# Patient Record
Sex: Male | Born: 1965 | Race: Black or African American | Hispanic: No | Marital: Married | Smoking: Never smoker
Health system: Southern US, Community
[De-identification: ages and names within clinical notes are randomized; demographics above are authoritative.]

## PROBLEM LIST (undated history)

## (undated) DIAGNOSIS — I5022 Chronic systolic (congestive) heart failure: Secondary | ICD-10-CM

## (undated) DIAGNOSIS — Z1211 Encounter for screening for malignant neoplasm of colon: Secondary | ICD-10-CM

## (undated) DIAGNOSIS — I300 Acute nonspecific idiopathic pericarditis: Secondary | ICD-10-CM

## (undated) DIAGNOSIS — I509 Heart failure, unspecified: Secondary | ICD-10-CM

## (undated) DIAGNOSIS — I447 Left bundle-branch block, unspecified: Secondary | ICD-10-CM

## (undated) DIAGNOSIS — E559 Vitamin D deficiency, unspecified: Secondary | ICD-10-CM

## (undated) DIAGNOSIS — I428 Other cardiomyopathies: Secondary | ICD-10-CM

## (undated) DIAGNOSIS — G4733 Obstructive sleep apnea (adult) (pediatric): Secondary | ICD-10-CM

## (undated) DIAGNOSIS — E7800 Pure hypercholesterolemia, unspecified: Secondary | ICD-10-CM

## (undated) DIAGNOSIS — G4730 Sleep apnea, unspecified: Secondary | ICD-10-CM

## (undated) DIAGNOSIS — R600 Localized edema: Secondary | ICD-10-CM

## (undated) DIAGNOSIS — I10 Essential (primary) hypertension: Secondary | ICD-10-CM

## (undated) HISTORY — DX: Acute nonspecific idiopathic pericarditis: I30.0

## (undated) HISTORY — DX: Heart failure, unspecified: I50.9

## (undated) HISTORY — DX: Other cardiomyopathies: I42.8

## (undated) HISTORY — DX: Pure hypercholesterolemia, unspecified: E78.00

## (undated) HISTORY — DX: Localized edema: R60.0

## (undated) HISTORY — DX: Obstructive sleep apnea (adult) (pediatric): G47.33

## (undated) HISTORY — DX: Encounter for screening for malignant neoplasm of colon: Z12.11

## (undated) HISTORY — PX: POLYPECTOMY: SHX149

## (undated) HISTORY — DX: Essential (primary) hypertension: I10

## (undated) HISTORY — DX: Vitamin D deficiency, unspecified: E55.9

## (undated) HISTORY — DX: Chronic systolic (congestive) heart failure: I50.22

## (undated) HISTORY — PX: CARDIAC CATHETERIZATION: SHX172

## (undated) HISTORY — DX: Left bundle-branch block, unspecified: I44.7

## (undated) HISTORY — DX: Sleep apnea, unspecified: G47.30

## (undated) HISTORY — PX: COLONOSCOPY: SHX174

## (undated) HISTORY — DX: Morbid (severe) obesity due to excess calories: E66.01

---

## 2009-03-05 DIAGNOSIS — E782 Mixed hyperlipidemia: Secondary | ICD-10-CM | POA: Insufficient documentation

## 2009-03-05 DIAGNOSIS — E1169 Type 2 diabetes mellitus with other specified complication: Secondary | ICD-10-CM | POA: Insufficient documentation

## 2009-05-02 DIAGNOSIS — I517 Cardiomegaly: Secondary | ICD-10-CM | POA: Insufficient documentation

## 2009-09-29 ENCOUNTER — Inpatient Hospital Stay (HOSPITAL_COMMUNITY): Admission: EM | Admit: 2009-09-29 | Discharge: 2009-10-02 | Payer: Self-pay | Admitting: Emergency Medicine

## 2009-09-29 ENCOUNTER — Ambulatory Visit: Payer: Self-pay | Admitting: Cardiology

## 2009-10-14 ENCOUNTER — Ambulatory Visit: Payer: Self-pay | Admitting: Cardiology

## 2009-10-14 DIAGNOSIS — I5022 Chronic systolic (congestive) heart failure: Secondary | ICD-10-CM | POA: Insufficient documentation

## 2009-10-14 DIAGNOSIS — E7800 Pure hypercholesterolemia, unspecified: Secondary | ICD-10-CM | POA: Insufficient documentation

## 2009-10-15 LAB — CONVERTED CEMR LAB
CO2: 28 meq/L (ref 19–32)
Chloride: 104 meq/L (ref 96–112)
GFR calc non Af Amer: 62.49 mL/min (ref >60)
Potassium: 4.5 meq/L (ref 3.5–5.1)
Sodium: 140 meq/L (ref 135–145)

## 2009-10-16 ENCOUNTER — Telehealth (INDEPENDENT_AMBULATORY_CARE_PROVIDER_SITE_OTHER): Payer: Self-pay | Admitting: *Deleted

## 2009-10-20 ENCOUNTER — Encounter: Payer: Self-pay | Admitting: Cardiology

## 2009-10-20 ENCOUNTER — Encounter (HOSPITAL_COMMUNITY): Admission: RE | Admit: 2009-10-20 | Discharge: 2010-01-06 | Payer: Self-pay | Admitting: Cardiology

## 2009-10-20 ENCOUNTER — Ambulatory Visit: Payer: Self-pay

## 2009-10-20 ENCOUNTER — Ambulatory Visit: Payer: Self-pay | Admitting: Cardiology

## 2009-10-28 ENCOUNTER — Telehealth: Payer: Self-pay | Admitting: Cardiology

## 2009-10-30 ENCOUNTER — Telehealth: Payer: Self-pay | Admitting: Cardiology

## 2009-11-04 ENCOUNTER — Telehealth: Payer: Self-pay | Admitting: Cardiology

## 2009-11-04 ENCOUNTER — Ambulatory Visit: Payer: Self-pay | Admitting: Cardiology

## 2009-11-04 DIAGNOSIS — R9430 Abnormal result of cardiovascular function study, unspecified: Secondary | ICD-10-CM | POA: Insufficient documentation

## 2009-11-04 HISTORY — DX: Abnormal result of cardiovascular function study, unspecified: R94.30

## 2009-11-06 ENCOUNTER — Ambulatory Visit: Payer: Self-pay | Admitting: Cardiology

## 2009-11-06 LAB — CONVERTED CEMR LAB
CO2: 25 meq/L (ref 19–32)
Chloride: 108 meq/L (ref 96–112)
Creatinine, Ser: 1.2 mg/dL (ref 0.4–1.5)
Eosinophils Relative: 2.5 % (ref 0.0–5.0)
GFR calc non Af Amer: 81.58 mL/min (ref >60)
Hemoglobin: 12.7 g/dL — ABNORMAL LOW (ref 13.0–17.0)
Lymphocytes Relative: 21.4 % (ref 12.0–46.0)
Lymphs Abs: 1.7 10*3/uL (ref 0.7–4.0)
MCHC: 33.2 g/dL (ref 30.0–36.0)
Monocytes Absolute: 0.6 10*3/uL (ref 0.1–1.0)
Neutro Abs: 5.3 10*3/uL (ref 1.4–7.7)
Potassium: 4.4 meq/L (ref 3.5–5.1)
RDW: 15.1 % — ABNORMAL HIGH (ref 11.5–14.6)

## 2009-11-12 ENCOUNTER — Ambulatory Visit: Payer: Self-pay | Admitting: Internal Medicine

## 2009-11-13 ENCOUNTER — Inpatient Hospital Stay (HOSPITAL_BASED_OUTPATIENT_CLINIC_OR_DEPARTMENT_OTHER): Admission: RE | Admit: 2009-11-13 | Discharge: 2009-11-13 | Payer: Self-pay | Admitting: Cardiology

## 2009-11-13 ENCOUNTER — Ambulatory Visit: Payer: Self-pay | Admitting: Cardiology

## 2009-11-13 HISTORY — PX: LEFT HEART CATH: SHX5946

## 2009-12-08 ENCOUNTER — Ambulatory Visit: Payer: Self-pay | Admitting: Cardiology

## 2009-12-22 ENCOUNTER — Ambulatory Visit: Payer: Self-pay | Admitting: Cardiology

## 2009-12-24 LAB — CONVERTED CEMR LAB
Albumin: 3.7 g/dL (ref 3.5–5.2)
BUN: 16 mg/dL (ref 6–23)
CO2: 27 meq/L (ref 19–32)
Calcium: 9.2 mg/dL (ref 8.4–10.5)
Chloride: 108 meq/L (ref 96–112)
Glucose, Bld: 92 mg/dL (ref 70–99)
HDL: 26.4 mg/dL — ABNORMAL LOW (ref >39.00)
LDL Cholesterol: 79 mg/dL (ref 0–99)
Potassium: 4.5 meq/L (ref 3.5–5.1)
Sodium: 141 meq/L (ref 135–145)
Total Bilirubin: 0.5 mg/dL (ref 0.3–1.2)
Total CHOL/HDL Ratio: 5
Total Protein: 6.6 g/dL (ref 6.0–8.3)
Triglycerides: 128 mg/dL (ref 0.0–149.0)

## 2010-01-01 ENCOUNTER — Encounter
Admission: RE | Admit: 2010-01-01 | Discharge: 2010-01-29 | Payer: Self-pay | Source: Home / Self Care | Admitting: Cardiology

## 2010-02-25 ENCOUNTER — Ambulatory Visit: Payer: Self-pay | Admitting: Cardiovascular Disease

## 2010-02-25 ENCOUNTER — Ambulatory Visit (HOSPITAL_COMMUNITY): Admission: RE | Admit: 2010-02-25 | Discharge: 2010-02-25 | Payer: Self-pay | Admitting: Cardiology

## 2010-02-25 ENCOUNTER — Encounter: Payer: Self-pay | Admitting: Internal Medicine

## 2010-02-25 ENCOUNTER — Ambulatory Visit: Payer: Self-pay | Admitting: Internal Medicine

## 2010-02-25 ENCOUNTER — Ambulatory Visit: Payer: Self-pay

## 2010-03-13 ENCOUNTER — Ambulatory Visit: Payer: Self-pay | Admitting: Cardiology

## 2010-03-23 ENCOUNTER — Ambulatory Visit: Payer: Self-pay | Admitting: Cardiology

## 2010-03-24 DIAGNOSIS — I300 Acute nonspecific idiopathic pericarditis: Secondary | ICD-10-CM

## 2010-03-24 HISTORY — DX: Acute nonspecific idiopathic pericarditis: I30.0

## 2010-06-02 NOTE — Assessment & Plan Note (Signed)
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  He was initially diagnosed with CHF about 10 years ago.  This was diagnosed at [facility] and felt to likely be due to a viral cardiomyopathy.  He did well until recently.   He was admitted to [facility] on 09/29/09 with acute CHF and hypertensive urgency.  He was severely short of breath for the few days prior to admission and was wheezing.  He was noted to have pulmonary edema and volume overload and was diuresed and begun on a medical regimen for his CHF (he was previously not taking beta blockers or ace inhibitors).  He was evaluate by Dr Shirlee Latch and initiated on medical therapy for his CHF.  He has a heart cath planned for tomorrow.  Since discharge home, he has done reasonably well.  He has started working out at [employer] 3 days a week (treadmill and weights) and he walks 1-2 miles a day when he does not go to the gym.  He continues to have decreased exercise tolerance and has not started mowing his grass again.  He continues to have intermittent BLE edema.   Given the relatively new onset symptomatic CHF (apparently had well-compensated CHF over a number of years), he had an adenosine myoview  which revealed  EF 21% with scar in the anterior and inferior walls and at the apex with suggestion of peri-infarction ischemia.  He is scheduled for heart cath tomorrow.  Current Medications (verified): 1)  Coreg 12.5 Mg Tabs (Carvedilol) .... One Tablet Twice A Day 2)  Furosemide 40 Mg Tabs (Furosemide) .... Take One Tablet Daily 3)  Lisinopril 5 Mg Tabs (Lisinopril) .... Take One Tablet Two Times A Day 4)  Aspirin 81 Mg Tabs (Aspirin) .... Once Daily 5)  Spironolactone 25 Mg Tabs  (Spironolactone) .... One Tablet Daily 6)  Multivitamins  Tabs (Multiple Vitamin) .... Once Daily 7)  Fish Oil 1000 Mg Caps (Omega-3 Fatty Acids) .... Once Daily 8)  Simvastatin 40 Mg Tabs (Simvastatin) .... Take One Tablet Once Daily 9)  Klor-Con M20 20 Meq Cr-Tabs (Potassium Chloride Crys Cr) .... Take One Tablet Daily  Allergies (verified): No Known Drug Allergies  Past History:  Past Medical History: Reviewed history from 11/04/2009 and no changes required. 1. Cardiomyopathy: Probably nonischemic.  He had a left heart cath in 2002 or 2003 with no significant coronary disease per his report (this was done in [place]).  It sounds like his cardiomyopathy has been known for around 9-10 years.  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  Echo (5/11): severe LV dilation with EF < 20%, diffuse hypokinesis, moderate diastolic dysfunction, severe left atrial enlargement.  First hospitalization for CHF was in 5/11.  No drug use.  Adenosine myoview (6/11): EF 21%, diffuse hypokinesis, scar in the anterior and inferior walls and at the apex.  Mild peri-infarct ischemia.  2. Hypertension 3. Chronic left bundle branch block 4. Hyperlipidemia  Past Surgical History: none  Family History: Reviewed history from 10/14/2009 and no changes required. Family history is negative for premature diagnosis of coronary artery disease or any arrhythmia or history of heart failure in first-degree relatives.  Mother and father do  have a history of hypertension  Social History: The patient lives in [place] with his wife and 2 children.   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   Vital Signs:  Patient profile:   45 year old male Height:       73 inches Weight:      272 pounds BMI:     36.02 Pulse rate:   90 / minute BP sitting:   116 / 82  (left arm)  Vitals Entered By: Laurance Flatten CMA (November 12, 2009 2:45 PM)  Physical Exam  General:  Well developed, well nourished, in no acute distress. Head:  normocephalic and atraumatic Mouth:  Teeth, gums and palate normal. Oral mucosa normal. Neck:  supple, JVP 10cm Lungs:  Clear bilaterally to auscultation and percussion. Heart:  RRR, paradoxical S2 split, no m/r/g Abdomen:  Bowel sounds positive; abdomen soft and non-tender without masses, organomegaly, or hernias noted. No hepatosplenomegaly. Msk:  Back normal, normal gait. Muscle strength and tone normal. Pulses:  pulses normal in all 4 extremities Extremities:  No clubbing or cyanosis. trace edema Neurologic:  Alert and oriented x 3. Skin:  Intact without lesions or rashes. Cervical Nodes:  no significant adenopathy Psych:  Normal affect.   EKG  Procedure date:  10/15/2009  Findings:      sinus rhythm 88 bpm, LBBB (QRS 186 ms), Qtc 532  Impression & Recommendations:  Problem # 1:  CHRONIC SYSTOLIC HEART FAILURE (ICD-428.22) The patient has a nonischemic CM (EF 21%), NYHA Class II/III CHF, and LBBB.  He has a heart catheterization scheduled for tomorrow.  If he has nonobstructive CAD and continues to have a depressed ejection fraction after 3 months of medical optimization, then he will meet SCD-HeFT criteria for ICD implantation.  If he has obstructive CAD requiring revascularizaiton or his EF improves to >35% with medical therapy, then we will have to reconsider his options.  Today, we discussed risks, benefits, alternatives to ICD implantation.  He has NYHA Class II/III CHF and a wide LBBB on EKG.  I would therefore also consider resynchronization therapy at the same time as ICD implantation. We will obtain an echo after 3 months of optimal medical management (9/11) and then proceed with BiV ICD implantation if he meets  criteria at that time.  Other Orders: Echocardiogram (Echo)  Patient Instructions: 1)  Your physician recommends that you schedule a follow-up appointment in: 01/2010 with Dr Johney Frame with an Echo prior to appointment or the same day as appoinment

## 2010-06-02 NOTE — Assessment & Plan Note (Signed)
Summary: 3wk f/u cath done 11-13-09/sl   Referring Provider:  Dr Shirlee Latch Primary Provider:  [name], MD  CC:  3 week follow up to cardiac cath.  Pt reports he is feeling fine today.  No cardiac concerns today.  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  He was initially diagnosed with CHF about 10 years ago.  This was diagnosed at [facility] and felt to likely be due to a viral cardiomyopathy.   He was admitted to [facility] on 09/29/09 with acute CHF and hypertensive urgency.  He was severely short of breath for the few days prior to admission and was wheezing.  He was noted to have pulmonary edema and volume overload and was diuresed and begun on a medical regimen for his CHF (he was previously not taking beta blockers or ace inhibitors).  Given a myoview revealing extensive perfusion defects, he had a left heart cath done which showed EF 15% on LV-gram and no angiographic coronary disease.    Recently, he has been doing well.  He is going to the [facility] three days a week.  He denies exertional dyspnea on current medical regimen.  He has lost 2.5 lbs since I last saw him.  No orthopnea or PND.   Labs (6/11): BNP 347=>161=>167, creatinine 1.08=>1.6, urine drug screen negative, TSH normal, LDL 147, HDL 34, TGs 112 Labs (7/11): K 4.4, creatinine 1.2    Current Medications (verified): 1)  Coreg 12.5 Mg Tabs (Carvedilol) .... One Tablet Twice A Day 2)  Furosemide 40 Mg Tabs (Furosemide) .... Take One Tablet Daily 3)  Lisinopril 5 Mg Tabs (Lisinopril) .... Take One Tablet Two Times A Day 4)  Aspirin 81 Mg Tabs (Aspirin) .... Once Daily 5)  Spironolactone 25 Mg Tabs (Spironolactone) .... One Tablet Daily 6)  Multivitamins  Tabs (Multiple Vitamin) .... Once Daily 7)  Fish Oil 1000 Mg Caps (Omega-3 Fatty Acids) .... Once Daily 8)  Simvastatin 40 Mg Tabs  (Simvastatin) .... Take One Tablet Once Daily 9)  Klor-Con M20 20 Meq Cr-Tabs (Potassium Chloride Crys Cr) .... Take One Tablet Daily  Allergies (verified): No Known Drug Allergies  Past History:  Past Medical History: 1. Cardiomyopathy: Probably nonischemic.  He had a left heart cath in 2002 or 2003 with no significant coronary disease per his report (this was done in [place]).  It sounds like his cardiomyopathy has been known for around 9-10 years.  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  Echo (5/11): severe LV dilation with EF < 20%, diffuse hypokinesis, moderate diastolic dysfunction, severe left atrial enlargement.  First hospitalization for CHF was in 5/11.  No drug use.  Adenosine myoview (6/11): EF 21%, diffuse hypokinesis, scar in the anterior and inferior walls and at the apex.  Mild peri-infarct ischemia.  LHC was done (7/11) showing EF 15%, global hypokinesis, LVEDP 22 mmHg, no angiographic CAD.  2. Hypertension 3. Chronic left bundle branch block 4. Hyperlipidemia  Family History: Reviewed history from 10/14/2009 and no changes required. Family history is negative for premature diagnosis of coronary artery disease or any arrhythmia or history of heart failure in first-degree relatives.  Mother and father do have a history of hypertension  Social History: Reviewed history from 11/12/2009 and no changes required. The patient lives in [place] with his wife and 2 children.  He is a Data processing manager for [employer].  He denies significant tobacco, EtOH, nor any illicit drug use, herbal medication  use, regular diet, and no regular exercise  Review of Systems       All systems reviewed and negative except as per HPI.   Vital Signs:  Patient profile:   45 year old male Height:      73 inches Weight:      270 pounds BMI:     35.75 Pulse rate:   75 / minute Pulse rhythm:   regular BP sitting:   108 / 70  (left  arm) Cuff size:   regular  Vitals Entered By: Judithe Modest CMA (December 08, 2009 4:53 PM)  Physical Exam  General:  Well developed, well nourished, in no acute distress. Neck:  Neck supple, no JVD. No masses, thyromegaly or abnormal cervical nodes. Lungs:  Clear bilaterally to auscultation and percussion. Heart:  Non-displaced PMI, chest non-tender; regular rate and rhythm, S1, S2 without murmurs, rubs or gallops. S2 is paradoxically split.  Carotid upstroke normal, no bruit.  Pedals normal pulses. No edema, no varicosities. Abdomen:  Bowel sounds positive; abdomen soft and non-tender without masses, organomegaly, or hernias noted. No hepatosplenomegaly. Extremities:  No clubbing or cyanosis. Neurologic:  Alert and oriented x 3. Psych:  Normal affect.   Impression & Recommendations:  Problem # 1:  CHRONIC SYSTOLIC HEART FAILURE (ICD-428.22) The patient has a nonischemic CM (EF EF 15-20%) and LBBB.  He is doing well functionally with current CHF treatment.  I will continue him on the current dose of Coreg and spironolactone.  I will increase his lisinopril to 7.5 mg two times a day.  He appears euvolemic on exam and will continue current dose of Lasix.  BMET in 2 wks.  He has LBBB and chronically depressed systolic function.  I will repeat an echo on good medical therapy in 9/11.  If EF is still depressed, he will undergo placement of CRT-D device (Dr. Johney Frame).   Problem # 2:  PURE HYPERCHOLESTEROLEMIA (ICD-272.0) Lipids/LFTs have been ok.   Patient Instructions: 1)  Your physician has recommended you make the following change in your medication:  2)  Increase Lisinopril to 7.5mg  twice a day--this will be one and one-half 5mg  tablets twice a day. 3)  Your physician recommends that you return for lab work in: 2 weeks--BMP 428.22 4)  Your physician recommends that you schedule a follow-up appointment in: 3 months with Dr Shirlee Latch. Prescriptions: LISINOPRIL 5 MG TABS (LISINOPRIL) one and  one-half tablets twice a day  #90 x 11   Entered by:   Katina Dung, RN, BSN   Authorized by:   Marca Ancona, MD   Signed by:   Katina Dung, RN, BSN on 12/08/2009   Method used:   Electronically to        [facility]. [phone]* (retail)       [address]       [place], [place]  [address]       Ph: [phone]       Fax: [phone]   RxID:   [id number]

## 2010-06-02 NOTE — Progress Notes (Signed)
Summary: PT WANTS A NURSE CALL [facility] APPT TODAY  Phone Note Call from Patient   Caller: Patient [phone] Reason for Call: Talk to Nurse Summary of Call: PT CALLED TO [facility] APPT TODAY-NEXT AVAILABLE IS JULY 12, PT DECIDED HE WANTED A CALL FROM THE NURSE-PLS CALL [phone] Initial call taken by: Glynda Jaeger,  October 28, 2009 8:38 AM  Follow-up for Phone Call        talked with pt by telephone--resheduled appt to Tuesday July 5,2011 with Dr Shirlee Latch

## 2010-06-02 NOTE — Progress Notes (Signed)
Summary: wants rx refill  Phone Note Call from Patient   Caller: Patient Reason for Call: Refill Medication Summary of Call: pt states [facility] to request refill of potassium-hasn't recieved it yet-can it be called in? Initial call taken by: Glynda Jaeger,  October 30, 2009 3:15 PM    Prescriptions: KLOR-CON M20 20 MEQ CR-TABS (POTASSIUM CHLORIDE CRYS CR) take one tablet daily  #30 x 11   Entered by:   Katina Dung, RN, BSN   Authorized by:   Marca Ancona, MD   Signed by:   Katina Dung, RN, BSN on 10/30/2009   Method used:   Electronically to        [facility]. [phone]* (retail)       [address]       [place], [place]  [address]       Ph: [phone]       Fax: [phone]   RxID:   [id number]

## 2010-06-02 NOTE — Letter (Signed)
Summary: Cardiac Catheterization Instructions- [facility]  [facility], Main Office  [address]   [place], [place] [address]   Phone: [phone]  Fax: [phone]     11/04/2009 MRN: [id number]  IV[address]EN [address] [place], [place]  [address]  Dear Mr. Missildine,   You are scheduled for a Cardiac Catheterization on Thursday July 14 with Dr. Shirlee Latch.  Please arrive to the 1st floor of the Heart and Vascular Center at [facility] at 8:30 am on the day of your procedure. Please do not arrive before 6:30 a.m. Call the Heart and Vascular Center at [phone] if you are unable to make your appointmnet. The Code to get into the parking garage under the building is 1000. Take the elevators to the 1st floor. You must have someone to drive you home. Someone must be with you for the first 24 hours after you arrive home. Please wear clothes that are easy to get on and off and wear slip-on shoes. Do not eat or drink after midnight except water with your medications that morning. Bring all your medications and current insurance cards with you.   Return for lab July 7,2011 at the [facility].  _x__ Make sure you take your aspirin.  _x__ You may take ALL of your medications with water that morning. ________________________________________________________________________________________________________________________________    The usual length of stay after your procedure is 2 to 3 hours. This can vary.  If you have any questions, please call the office at the number listed above.   Katina Dung, RN, BSN

## 2010-06-02 NOTE — Assessment & Plan Note (Signed)
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  He was initially diagnosed with CHF about 10 years ago.  This was diagnosed at [facility] and felt to likely be due to a viral cardiomyopathy.   He was admitted to [facility] on 09/29/09 with acute CHF and hypertensive urgency.  He was severely short of breath for the few days prior to admission and was wheezing.  He was noted to have pulmonary edema and volume overload and was diuresed and begun on a medical regimen for his CHF (he was previously not taking beta blockers or ace inhibitors).  Given a myoview revealing extensive perfusion defects, he had a left heart cath done which showed EF 15% on LV-gram and no angiographic coronary disease.    Recently, he has been doing well.   He reports complaince with medical therapy.  Unfortunately, despite optimal medical therapies, his EF remains 20%.  Current Medications (verified): 1)  Coreg 12.5 Mg Tabs (Carvedilol) .... One Tablet Twice A Day 2)  Furosemide 40 Mg Tabs (Furosemide) .... Take One Tablet Daily 3)  Lisinopril 5 Mg Tabs (Lisinopril) .... One and One-Half Tablets Twice A Day 4)  Aspirin 81 Mg Tabs (Aspirin) .... Once Daily 5)  Spironolactone 25 Mg Tabs (Spironolactone) .... One Tablet Daily 6)  Multivitamins  Tabs (Multiple Vitamin) .... Once Daily 7)  Fish Oil 1000 Mg Caps (Omega-3 Fatty Acids) .... Once Daily 8)  Simvastatin 40 Mg Tabs (Simvastatin) .... Take One Tablet Once Daily 9)  Klor-Con M20 20 Meq Cr-Tabs (Potassium Chloride Crys Cr) .... Take One Tablet Daily  Allergies (verified): No Known Drug Allergies  Past History:  Past Medical History: Reviewed history from 12/08/2009  and no changes required. 1. Cardiomyopathy: Probably nonischemic.  He had a left heart cath in 2002 or 2003 with no significant coronary disease per his report (this was done in [place]).  It sounds like his cardiomyopathy has been known for around 9-10 years.  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  Echo (5/11): severe LV dilation with EF < 20%, diffuse hypokinesis, moderate diastolic dysfunction, severe left atrial enlargement.  First hospitalization for CHF was in 5/11.  No drug use.  Adenosine myoview (6/11): EF 21%, diffuse hypokinesis, scar in the anterior and inferior walls and at the apex.  Mild peri-infarct ischemia.  LHC was done (7/11) showing EF 15%, global hypokinesis, LVEDP 22 mmHg, no angiographic CAD.  2. Hypertension 3. Chronic left bundle branch block 4. Hyperlipidemia  Past Surgical History: Reviewed history from 11/12/2009 and no changes required. none  Social History: Reviewed history from 11/12/2009 and no changes required. The patient lives in [place] with his wife and 2 children.   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   Vital Signs:  Patient profile:   45 year old male Height:      73 inches Pulse rate:  58 / minute BP sitting:   130 / 82  (left arm)  Vitals Entered By: Laurance Flatten CMA (February 25, 2010 4:54 PM)  Physical Exam  General:  Well developed, well nourished, in no acute distress. Head:  normocephalic and atraumatic Mouth:  Teeth, gums and palate normal. Oral mucosa normal. Neck:  Neck supple, no JVD. No masses, thyromegaly or abnormal cervical nodes. Lungs:  Clear bilaterally to auscultation and percussion. Heart:  RRR, no m/r/g, wide S2 split Abdomen:  Bowel sounds  positive; abdomen soft and non-tender without masses, organomegaly, or hernias noted. No hepatosplenomegaly. Msk:  Back normal, normal gait. Muscle strength and tone normal. Pulses:  pulses normal in all 4 extremities Extremities:  No clubbing or cyanosis. Neurologic:  Alert and oriented x 3.   EKG  Procedure date:  02/25/2010  Findings:      sinus rhythm 58 bpm, LBBB (QRS in lead I)  Impression & Recommendations:  Problem # 1:  CHRONIC SYSTOLIC HEART FAILURE (ICD-428.22) The patient has a nonischemic CM (EF 20%) and LBBB.  He is doing well functionally with current CHF treatment.  I reviewed his echo today with Gary Williamson.  Unfortunately, despite optimal medical therapy, his EF remains depressed.  He meets SCD-HeFT criteria for ICD implantation for primary prevention of sudden death.   I also believe that he would benefit from resynchronization therapy.  I have encouraged BiV ICD implantation today.  The patient wishes to further contemplate this option.  He wishes to return with his spouse to further discuss ICD implantation.  He will continue medical therapy in the interm.  Patient Instructions: 1)  Your physician recommends that you schedule a follow-up appointment in: 2 WEEKS WITH [facility] 2)  Your physician recommends that you continue on your current medications as directed. Please refer to the Current Medication list given to you today.

## 2010-06-02 NOTE — Cardiovascular Report (Signed)
Summary: Pre Cath Orders   Pre Cath Orders   Imported By: Roderic Ovens 11/14/2009 12:32:03  _____________________________________________________________________  External Attachment:    Type:   Image     Comment:   External Document

## 2010-06-02 NOTE — Assessment & Plan Note (Signed)
Summary: Cardiology Nuclear Study  Nuclear Med Background Indications for Stress Test: Evaluation for Ischemia, Post Hospital  Indications Comments: 09/29/09 [facility]: Acute CHF/HTN/SOB/wheezing/Edema  History: Echo, Heart Catheterization  History Comments: '03 Heart Cath: NL per Pt., no report 09/30/09 Echo: EF=<20%  Symptoms: DOE, Fatigue, SOB    Nuclear Pre-Procedure Cardiac Risk Factors: Hypertension, LBBB, Lipids Caffeine/Decaff Intake: None NPO After: 6:00 PM Lungs: clear IV 0.9% NS with Angio Cath: 18g     IV Site: (R) AC IV Started by: Stanton Kidney EMT-P Chest Size (in) 52     Height (in): 73 Weight (lb): 269 BMI: 35.62 Tech Comments: Coreg held per patient.  Nuclear Med Study 1 or 2 day study:  1 day     Stress Test Type:  Adenosine Reading MD:  Willa Rough, MD     Referring MD:  D.Mclean Resting Radionuclide:  Technetium 70m Tetrofosmin     Resting Radionuclide Dose:  10.6 mCi  Stress Radionuclide:  Technetium 45m Tetrofosmin     Stress Radionuclide Dose:  33 mCi   Stress Protocol  Dose of Adenosine:  60 mg    Stress Test Technologist:  Milana Na EMT-P     Nuclear Technologist:  Domenic Polite CNMT  Rest Procedure  Myocardial perfusion imaging was performed at rest 45 minutes following the intravenous administration of Myoview Technetium 10m Tetrofosmin.  Stress Procedure  The patient received IV adenosine at 140 mcg/kg/min for 4 minutes. There were no significant changes with infusion. Myoview was injected at the 2 minute mark and quantitative spect images were obtained after a 45 minute delay.  QPS Raw Data Images:  Normal; no motion artifact; normal heart/lung ratio. Stress Images:  Moderate decrease in activity in the anterior wall and the inferior wall and the apex. Rest Images:  Similar to stress Subtraction (SDS):  mild reversibility Transient Ischemic Dilatation:  1.06  (Normal <1.22)  Lung/Heart Ratio:  48  (Normal <0.45)  Quantitative Gated  Spect Images QGS EDV:  420 ml QGS ESV:  333 ml QGS EF:  21 % QGS cine images:  Severe global hypokinesis and significant LV dilitation.   Overall Impression  Exercise Capacity: Adenosine study with no exercise. BP Response: Normal blood pressure response. Clinical Symptoms: SOB ECG Impression: LBBB Overall Impression Comments: There is LV dilitation and severe global LV dysfunction at rest. There is moderate scar in the anterior wall,  inferior wall, and apex  with mild perinfarct ischemia in these areas.    Appended Document: Cardiology Nuclear Study Anterior and inferior scar with peri-infarct ischemia.  ? CAD present.  Will need to discuss left heart cath.   Appended Document: Cardiology Nuclear Study  [facility] [phone]  Appended Document: Cardiology Nuclear Study discussed results with pt by telephone--appt made with Dr Shirlee Latch for 10/28/09

## 2010-06-02 NOTE — Assessment & Plan Note (Signed)
Summary: 2 month rov   Visit Type:  2 month follow up Referring Mukund Weinreb:  Dr Shirlee Latch Primary Emeril Stille:  [name], MD  CC:  no complaints.  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  He has been doing well recently.  He is working out at [employer] around 4 times a week, walking on the track and using the elliptical.  He denies exertional dyspnea.  No orthopnea or PND.  He is tolerating his medications well.  He met with Dr. Johney Frame regarding CRT-D and will go back for another appointment with his wife.  Repeat echo in 10/11 showed that EF remains 20-25%.  Weight remains stable at 270 lbs.   Labs (8/11): K 4.5, creatinine 1.3, LDL 79, HDL 26  Current Medications (verified): 1)  Coreg 12.5 Mg Tabs (Carvedilol) .... One Tablet Twice A Day 2)  Furosemide 40 Mg Tabs (Furosemide) .... Take One Tablet Daily 3)  Lisinopril 5 Mg Tabs (Lisinopril) .... One and One-Half Tablets Twice A Day 4)  Aspirin 81 Mg Tabs (Aspirin) .... Once Daily 5)  Spironolactone 25 Mg Tabs (Spironolactone) .... One Tablet Daily 6)  Multivitamins  Tabs (Multiple Vitamin) .... Once Daily 7)  Fish Oil 1000 Mg Caps (Omega-3 Fatty Acids) .... Once Daily 8)  Simvastatin 40 Mg Tabs (Simvastatin) .... Take One Tablet Once Daily 9)  Klor-Con M20 20 Meq Cr-Tabs (Potassium Chloride Crys Cr) .... Take One Tablet Daily  Allergies (verified): No Known Drug Allergies  Past History:  Past Medical History: 1. Cardiomyopathy: Probably nonischemic.  He had a left heart cath in 2002 or 2003 with no significant coronary disease per his report (this was done in [place]).  It sounds like his cardiomyopathy has been known for around 9-10 years.  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  Echo (5/11): severe LV dilation with EF < 20%, diffuse hypokinesis,  moderate diastolic dysfunction, severe left atrial enlargement.  First hospitalization for CHF was in 5/11.  No drug use.  Adenosine myoview (6/11): EF 21%, diffuse hypokinesis, scar in the anterior and inferior walls and at the apex.  Mild peri-infarct ischemia.  LHC was done (7/11) showing EF 15%, global hypokinesis, LVEDP 22 mmHg, no angiographic CAD.   Repeat echo (10/11): EF 20-25%, severely dilated LV with diffuse hypokinesis, mild MR.  2. Hypertension 3. Chronic left bundle branch block 4. Hyperlipidemia  Family History: Reviewed history from 10/14/2009 and no changes required. Family history is negative for premature diagnosis of coronary artery disease or any arrhythmia or history of heart failure in first-degree relatives.  Mother and father do have a history of hypertension  Social History: Reviewed history from 11/12/2009 and no changes required. The patient lives in [place] with his wife and 2 children.   He is a Data processing manager for [employer].  He denies significant tobacco, EtOH, nor any illicit drug use, herbal medication  use, regular diet, and no regular exercise  Review of Systems       All systems reviewed and negative except as per HPI.   Vital Signs:  Patient profile:   45 year old male Height:      73 inches Weight:      270.50 pounds BMI:     35.82 Pulse rate:   74 / minute BP sitting:  118 / 83  (left arm) Cuff size:   large  Vitals Entered By: Caralee Ates CMA (March 13, 2010 8:38 AM)  Physical Exam  General:  Well developed, well nourished, in no acute distress.  Obese.  Neck:  Neck supple, no JVD. No masses, thyromegaly or abnormal cervical nodes. Lungs:  Clear bilaterally to auscultation and percussion. Heart:  Non-displaced PMI, chest non-tender; regular rate and rhythm, S1, S2 without murmurs, rubs or gallops. S2 is paradoxically split.  Carotid upstroke normal, no bruit.  Pedals normal pulses. No edema, no  varicosities. Abdomen:  Bowel sounds positive; abdomen soft and non-tender without masses, organomegaly, or hernias noted. No hepatosplenomegaly. Extremities:  No clubbing or cyanosis. Neurologic:  Alert and oriented x 3. Psych:  Normal affect.   Impression & Recommendations:  Problem # 1:  CHRONIC SYSTOLIC HEART FAILURE (ICD-428.22) The patient has a nonischemic CM (EF EF 20-25%) and LBBB.  He is doing well functionally with current CHF treatment.  I will continue him on the current dose of Coreg and spironolactone.  I will increase his lisinopril to 10 mg two times a day with BMET/BNP in 2 wks.   He appears euvolemic on exam and will continue current dose of Lasix.  He has seen Dr. Johney Frame regarding CRT-D and thinks that he will go forward with this.  I certainly think that he needs this as patients with nonischemic cardiomyopathy and LBBB tend to do quite well with CRT.  We discussed starting Bidil in the future.  He thinks he took this in the past and it gave him a bad headache, but he was able to tolerate it when he took hydralazine and isordil separately.  We will plan on starting hydralazine and isordil when I see him again (increasing lisinopril today).   Problem # 2:  PURE HYPERCHOLESTEROLEMIA (ICD-272.0) Lipids/LFTs at goal.   Patient Instructions: 1)  Your physician has recommended you make the following change in your medication:  2)  Increase Lisinopril to 10mg  twice a day. You can take two 5mg  tablets twice a day. 3)  Your physician recommends that you return for lab work in: 10 days--BMP/BNP 428.22 4)  Your physician recommends that you schedule a follow-up appointment in: 3 months with Dr Shirlee Latch. Prescriptions: LISINOPRIL 10 MG TABS (LISINOPRIL) one twice a day  #180 x 3   Entered by:   Katina Dung, RN, BSN   Authorized by:   Marca Ancona, MD   Signed by:   Katina Dung, RN, BSN on 03/13/2010   Method used:   Electronically to        [facility])              ,          Ph:        Fax: [phone]   RxID:   [id number]

## 2010-06-02 NOTE — Assessment & Plan Note (Signed)
Summary: eph/jml   Primary Provider:  [name], MD  CC:  eph/ pt feeling well since discharge.  Marland Kitchen  History of Present Illness: 45 yo with history of presumed nonischemic cardiomyopathy presents to establish cardiology followup after recent admission for acute systolic CHF.  Patient reports the gradual onset of worsening exertional shortness of breath for the last 6 months.  Per the patient, his Coreg and lisinopril had been stopped at some point last year and he is not sure why.  He was admitted on 09/29/09 with acute CHF and hypertensive urgency.  He was severely short of breath for the few days prior to admission and was wheezing.  He was noted to have pulmonary edema and volume overload and was diuresed and begun on a medical regimen for his CHF.  Since discharge home, he has had no further dyspnea.  He has started working out at [employer] 3 days a week (treadmill and weights) and he walks 1-2 miles a day when he does not go to the gym.  He is doing all these activities and can climb steps without shortness of breath.  This is a significant improvement compared to prior to admission. No chest pain, orthopnea, PND, or wheezing.   Labs (6/11): BNP 347=>161=>167, creatinine 1.08=>1.67, urine drug screen negative, TSH normal, LDL 147, HDL 34, TGs 112  ECG: NSR, LBBB with QRS 186 msec  Current Medications (verified): 1)  Carvedilol 6.25 Mg Tabs (Carvedilol) .... Take One Tablet  Two Times A Day 2)  Furosemide 40 Mg Tabs (Furosemide) .... Take One Tablet Two Times A Day 3)  Lisinopril 5 Mg Tabs (Lisinopril) .... Take One Tablet Two Times A Day 4)  Aspirin 81 Mg Tabs (Aspirin) .... Once Daily 5)  Spironolactone 25 Mg Tabs (Spironolactone) .... 1/2 Tablet Once Daily 6)  Hydrochlorothiazide 25 Mg Tabs (Hydrochlorothiazide) .... Take One Tablet By Mouth Once Daily 7)  Multivitamins  Tabs (Multiple Vitamin) .... Once Daily 8)  Fish Oil 1000 Mg Caps (Omega-3 Fatty Acids) .... Once Daily 9)   Simvastatin 40 Mg Tabs (Simvastatin) .... Take One Tablet Once Daily 10)  Klor-Con M20 20 Meq Cr-Tabs (Potassium Chloride Crys Cr) .... Take One Tablet Two Times A Day  Allergies (verified): No Known Drug Allergies  Past History:  Past Medical History: 1. Cardiomyopathy: Probably nonischemic.  He had a left heart cath in 2002 or 2003 with no significant coronary disease per his report (this was done in [place]).  It sounds like his cardiomyopathy has been known for around 9-10 years.  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  Echo (5/11): severe LV dilation with EF < 20%, diffuse hypokinesis, moderate diastolic dysfunction, severe left atrial enlargement.  First hospitalization for CHF was in 5/11.  No drug use.  2. Hypertension 3. Chronic left bundle branch block 4. Hyperlipidemia  Family History: Reviewed history from 10/10/2009 and no changes required. Family history is negative for premature diagnosis of coronary artery disease or any arrhythmia or history of heart failure in first-degree relatives.  Mother and father do have a history of hypertension  Social History: Reviewed history from 10/10/2009 and no changes required. The patient lives in [place] with his wife and 2 children.   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   Vital Signs:  Patient profile:   45 year old male Height:      73 inches Weight:      269 pounds BMI:     35.62 Pulse rate:   90 / minute Pulse rhythm:   irregular BP sitting:   90 / 60  (left arm) Cuff size:   large  Vitals Entered By: Judithe Modest CMA (October 14, 2009 10:03 AM)  Physical Exam  General:  Well developed, well nourished, in no acute distress.  Obese.  Head:  normocephalic and  atraumatic Nose:  no deformity, discharge, inflammation, or lesions Mouth:  Teeth, gums and palate normal. Oral mucosa normal. Neck:  Neck supple, no JVD. No masses, thyromegaly or abnormal cervical nodes. Lungs:  Clear bilaterally to auscultation and percussion. Heart:  Non-displaced PMI, chest non-tender; regular rate and rhythm, S1, S2 without murmurs, rubs or gallops. Carotid upstroke normal, no bruit.  Pedals normal pulses. No edema, no varicosities. Abdomen:  Bowel sounds positive; abdomen soft and non-tender without masses, organomegaly, or hernias noted. No hepatosplenomegaly. Msk:  Back normal, normal gait. Muscle strength and tone normal. Extremities:  No clubbing or cyanosis. Neurologic:  Alert and oriented x 3. Skin:  Intact without lesions or rashes. Psych:  Normal affect.   Impression & Recommendations:  Problem # 1:  CHRONIC SYSTOLIC HEART FAILURE (ICD-428.22) Dilated cardiomyopathy, likely nonischemic has been present for almost 10 years.  He has been followed by cardiologists in [place] and in [place] in the past.  It certainly is possible that the cardiomyopathy is due to prior viral myocarditis.  He does appear euvolemic to dry now (with increased creatinine since discharge).  His symptoms are NYHA I-II.  - D/c HCTZ, decrease Lasix to once daily and KCl to once daily.  - Increase Coreg to 9.375 mg two times a day x 1 week, then to 12.5 mg two times a day.  - Continue current dose of lisinopril.  - Increase spironolactone to 25 mg daily.   - Repeat BMET in 2 wks - ETT-myoview to rule out significant ischemia (cath was 8-9 years ago and not completely clear if his cardiomyopathy was diagnosed before or after cath).   - I will refer him to EP.  He has a chronic LBBB.  He has had a cardiomyopathy for a number of years and per his report was on a good dose of Coreg in the past (25 mg two times a day) without improvement in LV systolic function (though more recently his  cardiac meds had been stopped).  I think that he would likely benefit from CRT-D.  He had been offered an ICD in the past.   Problem # 2:  PURE HYPERCHOLESTEROLEMIA (ICD-272.0) Lipids/LFTs in 2 months on simvastatin.   Other Orders: Nuclear Stress Test (Nuc Stress Test) EP Referral (Cardiology EP Ref ) TLB-BNP (B-Natriuretic Peptide) (83880-BNPR) TLB-BMP (Basic Metabolic Panel-BMET) (80048-METABOL)  Patient Instructions: 1)  Your physician has recommended you make the following change in your medication:  2)  Stop Hydrochlorothiazide 3)  Increase Spironolactone to 25mg  daily   4)  Increase Coreg(carvedilol) to 9.375mg  twice a day--this will be one and one-half of a 6.25mg  tablet twice a day--after one week increase Coreg(carvedilol) to 12.5mg  twice a day 5)  Your physician recommends that you have lab today and in 2 weeks--BMP/BNP 428.22   272.0 6)  Your physician recommends that you return for a FASTING lipid profile/liver profile  in 2 months  428.22   272.0  7)  Your physician recommends that you schedule a follow-up appointment with Dr Shirlee Latch a few days after you have the fasting lab in 2 months. 8)  You have been referred to EP for consideration of BiV ICD. 9)  Your physician has requested that you have an lexiscan myoview.  For further information please visit [email or web address]  Please follow instruction sheet, as given. Prescriptions: SPIRONOLACTONE 25 MG TABS (SPIRONOLACTONE) one tablet daily  #90 x 3   Entered by:   Katina Dung, RN, BSN   Authorized by:   Marca Ancona, MD   Signed by:   Katina Dung, RN, BSN on 10/14/2009   Method used:   Faxed to ...       [facility] (mail-order)             , [place]         Ph: [phone]       Fax: [phone]   RxID:   [id number] COREG 12.5 MG TABS (CARVEDILOL) one tablet twice a day  #180 x 3   Entered by:   Katina Dung, RN, BSN   Authorized by:   Marca Ancona, MD   Signed by:   Katina Dung, RN, BSN on 10/14/2009   Method  used:   Faxed to ...       [facility] (mail-order)             , [place]         Ph: [phone]       Fax: [phone]   RxID:   [id number] SPIRONOLACTONE 25 MG TABS (SPIRONOLACTONE) one tablet daily  #30 x 11   Entered by:   Katina Dung, RN, BSN   Authorized by:   Marca Ancona, MD   Signed by:   Katina Dung, RN, BSN on 10/14/2009   Method used:   Electronically to        [facility] [phone]* (retail)       [address]       [place], [place]  [address]       Ph: [phone]       Fax: [phone]   RxID:   [id number] COREG 12.5 MG TABS (CARVEDILOL) one tablet twice a day  #60 x 11   Entered by:   Katina Dung, RN, BSN   Authorized by:   Marca Ancona, MD   Signed by:   Katina Dung, RN, BSN on 10/14/2009   Method used:   Electronically to        [facility] [phone]* (retail)       [address]       [place], [place]  [address]       Ph: [phone]       Fax: [phone]   RxID:   [id number]

## 2010-06-02 NOTE — Progress Notes (Signed)
Summary: meds  Phone Note Call from Patient Call back at Home Phone [phone]   Caller: Patient Reason for Call: Talk to Nurse Summary of Call: per pt was seen today. have question regards meds. Initial call taken by: Lorne Skeens,  November 04, 2009 4:52 PM  Follow-up for Phone Call        [facility] Katina Dung, RN, BSN  November 04, 2009 5:02 PM  [facility] Katina Dung, RN, BSN  November 04, 2009 5:41 PM  talked with pt by telephone--pt requesting refill on Lisinopril--this has been done

## 2010-06-02 NOTE — Progress Notes (Signed)
Summary: Nuclear Pre-Procedure  Phone Note Outgoing Call Call back at [facility] Phone [phone]   Call placed by: Stanton Kidney, EMT-P,  October 16, 2009 10:57 AM Action Taken: Phone Call Completed Summary of Call: Left message with information on Myoview Information Sheet (see scanned document for details).     Nuclear Med Background Indications for Stress Test: Evaluation for Ischemia, Post Hospital  Indications Comments: 09/29/09 [facility]: Acute CHF/HTN/SOB/wheezing/Edema  History: Echo, Heart Catheterization  History Comments: '03 Heart Cath: NL per Pt., no report 09/30/09 Echo: EF=<20%     Nuclear Pre-Procedure Cardiac Risk Factors: Hypertension, LBBB, Lipids Height (in): 73

## 2010-06-02 NOTE — Assessment & Plan Note (Signed)
Summary: rov   Visit Type:  Follow-up Primary Provider:  [name], MD  CC:  No complains.  History of Present Illness: 45 yo with history of presumed nonischemic cardiomyopathy presents for cardiology followup after recent admission for acute systolic CHF.  He was admitted on 09/29/09 with acute CHF and hypertensive urgency.  He was severely short of breath for the few days prior to admission and was wheezing.  He was noted to have pulmonary edema and volume overload and was diuresed and begun on a medical regimen for his CHF.  Since discharge home, he has had no further dyspnea.  He has started working out at [employer] 3 days a week (treadmill and weights) and he walks 1-2 miles a day when he does not go to the gym.  He is doing all these activities and can climb steps without shortness of breath.  This is a significant improvement compared to prior to admission. No chest pain, orthopnea, PND, or wheezing.   Given the relatively new onset symptomatic CHF (apparently had well-compensated CHF over a number of years), I had him do an adenosine myoview (since he has a LBBB).  This showed EF 21% with scar in the anterior and inferior walls and at the apex with suggestion of peri-infarction ischemia.    Labs (6/11): BNP 347=>161=>167, creatinine 1.08=>1.6, urine drug screen negative, TSH normal, LDL 147, HDL 34, TGs 112    Current Medications (verified): 1)  Coreg 12.5 Mg Tabs (Carvedilol) .... One Tablet Twice A Day 2)  Furosemide 40 Mg Tabs (Furosemide) .... Take One Tablet Daily 3)  Lisinopril 5 Mg Tabs (Lisinopril) .... Take One Tablet Two Times A Day 4)  Aspirin 81 Mg Tabs (Aspirin) .... Once Daily 5)  Spironolactone 25 Mg Tabs (Spironolactone) .... One Tablet Daily 6)  Multivitamins  Tabs (Multiple Vitamin) .... Once Daily 7)  Fish Oil 1000 Mg Caps (Omega-3 Fatty Acids) .... Once Daily 8)  Simvastatin 40 Mg Tabs (Simvastatin) .... Take One Tablet Once Daily 9)  Klor-Con M20 20 Meq  Cr-Tabs (Potassium Chloride Crys Cr) .... Take One Tablet Daily  Allergies (verified): No Known Drug Allergies  Past History:  Past Medical History: 1. Cardiomyopathy: Probably nonischemic.  He had a left heart cath in 2002 or 2003 with no significant coronary disease per his report (this was done in [place]).  It sounds like his cardiomyopathy has been known for around 9-10 years.  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  Echo (5/11): severe LV dilation with EF < 20%, diffuse hypokinesis, moderate diastolic dysfunction, severe left atrial enlargement.  First hospitalization for CHF was in 5/11.  No drug use.  Adenosine myoview (6/11): EF 21%, diffuse hypokinesis, scar in the anterior and inferior walls and at the apex.  Mild peri-infarct ischemia.  2. Hypertension 3. Chronic left bundle branch block 4. Hyperlipidemia  Family History: Reviewed history from 10/14/2009 and no changes required. Family history is negative for premature diagnosis of coronary artery disease or any arrhythmia or history of heart failure in first-degree relatives.  Mother and father do have a history of hypertension  Social History: Reviewed history from 10/10/2009 and no changes required. The patient lives in [place] with his wife and 2 children.   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   Vital Signs:  Patient profile:   45 year old male Height:      73 inches Weight:      272.50 pounds BMI:     36.08 Pulse rate:   80 / minute Pulse rhythm:   regular Resp:     18 per minute BP sitting:   128 / 80  (left arm) Cuff size:   large  Vitals Entered By: Vikki Ports (November 04, 2009 2:10 PM)  Physical Exam  General:  Well developed, well nourished, in no  acute distress.  Obese.  Neck:  Neck supple, no JVD. No masses, thyromegaly or abnormal cervical nodes. Lungs:  Clear bilaterally to auscultation and percussion. Heart:  Non-displaced PMI, chest non-tender; regular rate and rhythm, S1, S2 without murmurs, rubs or gallops. S2 is paradoxically split.  Carotid upstroke normal, no bruit.  Pedals normal pulses. No edema, no varicosities. Abdomen:  Bowel sounds positive; abdomen soft and non-tender without masses, organomegaly, or hernias noted. No hepatosplenomegaly. Extremities:  No clubbing or cyanosis. Neurologic:  Alert and oriented x 3. Psych:  Normal affect.   Impression & Recommendations:  Problem # 1:  CHRONIC SYSTOLIC HEART FAILURE (ICD-428.22) Dilated cardiomyopathy, presumed nonischemic, has been present for almost 10 years.  He has been followed by cardiologists in [place] and in [place] in the past.  It certainly is possible that the cardiomyopathy is due to prior viral myocarditis.  He does appear euvolemic (with increased creatinine since discharge).  His symptoms are NYHA I-II.  Adenosine myoview showed areas of scar with peri-infarction ischemia.  - Repeat BMET now.  - Continue current Coreg, lisinopril, spironolactone, and Lasix.   - Given abnormal myoview, I will set him up for a radial cath in the outpatient lab.  He had a cath about 9 years ago without obstructive disease per his report but he had not been admitted to the hospital with symptomatic dyspnea until this last month. ? development of CAD to account for myoview abnormalities.   - I have referred him to EP.  He has a chronic LBBB.  He has had a cardiomyopathy for a number of years and per his report was on a good dose of Coreg in the past (25 mg two times a day) without improvement in LV systolic function (though more recently his cardiac meds had been stopped).  I think that he would likely benefit from CRT-D.  He had been offered an ICD in the past.   Problem #  2:  PURE HYPERCHOLESTEROLEMIA (ICD-272.0) Lipids/LFTs in 8/11 on simvastatin.   Other Orders: Cardiac Catheterization (Cardiac Cath)  Patient Instructions: 1)  Your physician recommends that you return for lab work on Thursday July 7,2011--BMP/CBC/PT 794.30 428.22 2)  Your physician has requested that you have a cardiac catheterization.  Cardiac catheterization is used to diagnose and/or treat various heart conditions. Doctors may recommend this procedure for a number of different reasons. The most common reason is to evaluate chest pain. Chest pain can be a symptom of coronary artery disease (CAD), and cardiac catheterization can show whether plaque is narrowing or blocking your heart's arteries. This procedure is also used to evaluate the valves, as well as measure the blood flow and oxygen levels in different parts of your heart.  For further information please visit [email or web address]  Please follow instruction sheet, as given. 3)  Your physician recommends that you schedule a follow-up appointment weeks after the cath  11/13/09 with Dr Shirlee Latch. Prescriptions: LISINOPRIL 5 MG TABS (LISINOPRIL) take one tablet two times a day  #180 x 3   Entered by:   Katina Dung, RN, BSN   Authorized by:   Marca Ancona, MD   Signed by:   Katina Dung, RN, BSN on 11/04/2009   Method used:   Faxed to ...       [facility] (mail-order)             , [place]         Ph: [phone]       Fax: [phone]   RxID:   [id number] LISINOPRIL 5 MG TABS (LISINOPRIL) take one tablet two times a day  #60 x 11   Entered by:   Katina Dung, RN, BSN   Authorized by:   Marca Ancona, MD   Signed by:   Katina Dung, RN, BSN on 11/04/2009   Method used:   Electronically to        [facility] [phone]* (retail)       [address]       [place], [place]  [address]       Ph: [phone]       Fax: [phone]   RxID:   [id number] LISINOPRIL 5 MG TABS (LISINOPRIL) take one tablet  two times a day  #30 x 11   Entered by:   Katina Dung, RN, BSN   Authorized by:   Marca Ancona, MD   Signed by:   Katina Dung, RN, BSN on 11/04/2009   Method used:   Electronically to        [facility] [phone]* (retail)       [address]       [place], [place]  [address]       Ph: [phone]       Fax: [phone]   RxID:   [id number]

## 2010-06-09 ENCOUNTER — Ambulatory Visit: Payer: Self-pay | Admitting: Cardiology

## 2010-06-29 ENCOUNTER — Ambulatory Visit (INDEPENDENT_AMBULATORY_CARE_PROVIDER_SITE_OTHER): Payer: BC Managed Care – PPO | Admitting: Cardiology

## 2010-06-29 ENCOUNTER — Encounter: Payer: Self-pay | Admitting: Cardiology

## 2010-06-29 DIAGNOSIS — I5022 Chronic systolic (congestive) heart failure: Secondary | ICD-10-CM

## 2010-07-01 ENCOUNTER — Other Ambulatory Visit: Payer: Self-pay | Admitting: Cardiology

## 2010-07-01 ENCOUNTER — Encounter: Payer: Self-pay | Admitting: Cardiology

## 2010-07-01 ENCOUNTER — Other Ambulatory Visit (INDEPENDENT_AMBULATORY_CARE_PROVIDER_SITE_OTHER): Payer: BC Managed Care – PPO

## 2010-07-01 DIAGNOSIS — I5022 Chronic systolic (congestive) heart failure: Secondary | ICD-10-CM

## 2010-07-01 LAB — BASIC METABOLIC PANEL
Creatinine, Ser: 1.5 mg/dL (ref 0.4–1.5)
GFR: 64.7 mL/min (ref >60.00)
Potassium: 4.3 mEq/L (ref 3.5–5.1)

## 2010-07-09 NOTE — Assessment & Plan Note (Signed)
Summary: 3 MONTH ROCV/RS PER PT-MJ/AMD unable to confirm appt lmom=mj   Referring Provider:  Dr Shirlee Latch Primary Provider:  [name], MD  CC:  3 month rov.  Pt has no cardiac concerns at this time. 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  He has been doing well recently.  He is working out at [employer] 3-4 times a week, walking on the track and using the elliptical.  He denies exertional dyspnea with mild to moderate activity.  No orthopnea or PND.  He is tolerating his medications well.  Repeat echo in 10/11 showed that EF remains 20-25%.  Weight is up 9 lbs.  Patient has not been as active and is not eating as well.  Labs (8/11): K 4.5, creatinine 1.3, LDL 79, HDL 26  Current Medications (verified): 1)  Coreg 12.5 Mg Tabs (Carvedilol) .... One Tablet Twice A Day 2)  Furosemide 40 Mg Tabs (Furosemide) .... Take One Tablet Daily 3)  Lisinopril 10 Mg Tabs (Lisinopril) .... One Twice A Day 4)  Aspirin 81 Mg Tabs (Aspirin) .... Once Daily 5)  Spironolactone 25 Mg Tabs (Spironolactone) .... One Tablet Daily 6)  Multivitamins  Tabs (Multiple Vitamin) .... Once Daily 7)  Fish Oil 1000 Mg Caps (Omega-3 Fatty Acids) .... Once Daily 8)  Simvastatin 40 Mg Tabs (Simvastatin) .... Take One Tablet Once Daily 9)  Klor-Con M20 20 Meq Cr-Tabs (Potassium Chloride Crys Cr) .... Take One Tablet Daily  Allergies (verified): No Known Drug Allergies  Past History:  Past Medical History: Reviewed history from 03/13/2010 and no changes required. 1. Cardiomyopathy: Probably nonischemic.  He had a left heart cath in 2002 or 2003 with no significant coronary disease per his report (this was done in [place]).  It sounds like his cardiomyopathy has been known for around 9-10 years.  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   Echo (5/11): severe LV dilation with EF < 20%, diffuse hypokinesis, moderate diastolic dysfunction, severe left atrial enlargement.  First hospitalization for CHF was in 5/11.  No drug use.  Adenosine myoview (6/11): EF 21%, diffuse hypokinesis, scar in the anterior and inferior walls and at the apex.  Mild peri-infarct ischemia.  LHC was done (7/11) showing EF 15%, global hypokinesis, LVEDP 22 mmHg, no angiographic CAD.   Repeat echo (10/11): EF 20-25%, severely dilated LV with diffuse hypokinesis, mild MR.  2. Hypertension 3. Chronic left bundle branch block 4. Hyperlipidemia  Family History: Reviewed history from 10/14/2009 and no changes required. Family history is negative for premature diagnosis of coronary artery disease or any arrhythmia or history of heart failure in first-degree relatives.  Mother and father do have a history of hypertension  Social History: Reviewed history from 11/12/2009 and no changes required. The patient lives in [place] with his wife and 2 children.   He is a Data processing manager for [employer].  He denies significant tobacco, EtOH, nor any illicit drug use, herbal medication  use, regular diet, and no regular exercise  Review of Systems       All systems reviewed and negative except as per HPI.   Vital Signs:  Patient profile:   45 year old male Height:      73 inches Weight:      279 pounds BMI:  36.94 Pulse rate:   76 / minute Pulse rhythm:   regular BP sitting:   120 / 82  (left arm) Cuff size:   large  Vitals Entered By: Judithe Modest CMA (June 29, 2010 4:53 PM)  Physical Exam  General:  Well developed, well nourished, in no acute distress.  Obese.  Neck:  Neck supple, no JVD. No masses, thyromegaly or abnormal cervical nodes. Lungs:  Clear bilaterally to auscultation and percussion. Heart:  Non-displaced PMI, chest non-tender; regular rate and rhythm, S1, S2 without murmurs, rubs or gallops. S2 is paradoxically  split.  Carotid upstroke normal, no bruit.  Pedals normal pulses. No edema, no varicosities. Abdomen:  Bowel sounds positive; abdomen soft and non-tender without masses, organomegaly, or hernias noted. No hepatosplenomegaly. Extremities:  No clubbing or cyanosis. Neurologic:  Alert and oriented x 3. Psych:  Normal affect.   Impression & Recommendations:  Problem # 1:  CHRONIC SYSTOLIC HEART FAILURE (ICD-428.22) The patient has a nonischemic CM (EF EF 20-25%) and LBBB.  He is doing well functionally with current CHF treatment.  I will continue him on the current dose of Coreg, lisinopril and spironolactone.   He appears euvolemic on exam and will continue current dose of Lasix.  He has seen Dr. Johney Frame regarding CRT-D and thinks that he will go forward with this.  He will contact Dr. Johney Frame regarding scheduling.  I certainly think that he needs this as patients with nonischemic cardiomyopathy and LBBB tend to do quite well with CRT.  I am going to start him today on hydralazine 25 mg three times a day and Imdur 30 mg daily. I will have him get a BMET to check K and creatinine.   Patient Instructions: 1)  Your physician has recommended you make the following change in your medication:  2)  start Hydralazine 25mg  three times a day. 3)  Start Imdur(isosorbide mononitrate) 30mg  daily. 4)  Lab Wednesday--BMP 428.22 5)  Your physician recommends that you schedule a follow-up appointment in: 3 months with Dr Shirlee Latch. Prescriptions: IMDUR 30 MG XR24H-TAB (ISOSORBIDE MONONITRATE) one daily  #90 x 3   Entered by:   Katina Dung, RN, BSN   Authorized by:   Marca Ancona, MD   Signed by:   Katina Dung, RN, BSN on 06/29/2010   Method used:   Electronically to        [facility])             ,          Ph:        Fax: [phone]   RxID:   [id number] HYDRALAZINE HCL 25 MG TABS (HYDRALAZINE HCL) one three times a day  #270 x 3   Entered by:   Katina Dung, RN, BSN   Authorized by:    Marca Ancona, MD   Signed by:   Katina Dung, RN, BSN on 06/29/2010   Method used:   Electronically to        [facility])             ,          Ph:        Fax: [phone]   RxID:   [id number] IMDUR 30 MG XR24H-TAB (ISOSORBIDE MONONITRATE) one daily  #30 x 6   Entered by:   Katina Dung, RN, BSN   Authorized by:   Marca Ancona, MD   Signed by:   Katina Dung, RN, BSN on 06/29/2010   Method used:  Electronically to        [facility]. [phone]* (retail)       [address]       [place], [place]  [address]       Ph: [phone]       Fax: [phone]   RxID:   [id number] HYDRALAZINE HCL 25 MG TABS (HYDRALAZINE HCL) one three times a day  #90 x 6   Entered by:   Katina Dung, RN, BSN   Authorized by:   Marca Ancona, MD   Signed by:   Katina Dung, RN, BSN on 06/29/2010   Method used:   Electronically to        [facility]. [phone]* (retail)       [address]       [place], [place]  [address]       Ph: [phone]       Fax: [phone]   RxID:   [id number]

## 2010-07-20 LAB — URINE CULTURE: Colony Count: NO GROWTH

## 2010-07-20 LAB — HEMOGLOBIN A1C
Hgb A1c MFr Bld: 5.7 % — ABNORMAL HIGH (ref <5.7)
Mean Plasma Glucose: 117 mg/dL — ABNORMAL HIGH (ref <117)

## 2010-07-20 LAB — CBC
HCT: 39.7 % (ref 39.0–52.0)
HCT: 40.3 % (ref 39.0–52.0)
HCT: 40.3 % (ref 39.0–52.0)
Hemoglobin: 13 g/dL (ref 13.0–17.0)
Hemoglobin: 13.3 g/dL (ref 13.0–17.0)
MCHC: 32.8 g/dL (ref 30.0–36.0)
MCV: 88.9 fL (ref 78.0–100.0)
MCV: 88.8 fL (ref 78.0–100.0)
Platelets: 187 10*3/uL (ref 150–400)
Platelets: 188 10*3/uL (ref 150–400)
Platelets: 186 10*3/uL (ref 150–400)
Platelets: 193 10*3/uL (ref 150–400)
RBC: 4.51 MIL/uL (ref 4.22–5.81)
RBC: 4.4 MIL/uL (ref 4.22–5.81)
RDW: 15.5 % (ref 11.5–15.5)
RDW: 15.3 % (ref 11.5–15.5)
WBC: 10 10*3/uL (ref 4.0–10.5)
WBC: 10.2 10*3/uL (ref 4.0–10.5)
WBC: 10.7 10*3/uL — ABNORMAL HIGH (ref 4.0–10.5)
WBC: 9.7 10*3/uL (ref 4.0–10.5)

## 2010-07-20 LAB — BASIC METABOLIC PANEL
BUN: 18 mg/dL (ref 6–23)
Calcium: 8.8 mg/dL (ref 8.4–10.5)
Chloride: 108 mEq/L (ref 96–112)
Creatinine, Ser: 1.12 mg/dL (ref 0.4–1.5)
GFR calc Af Amer: >60 mL/min (ref >60)
GFR calc Af Amer: >60 mL/min (ref >60)
GFR calc non Af Amer: >60 mL/min (ref >60)
Glucose, Bld: 97 mg/dL (ref 70–99)
Potassium: 3.9 mEq/L (ref 3.5–5.1)
Sodium: 139 mEq/L (ref 135–145)
Sodium: 140 mEq/L (ref 135–145)

## 2010-07-20 LAB — LIPID PANEL
Cholesterol: 203 mg/dL — ABNORMAL HIGH (ref 0–200)
Total CHOL/HDL Ratio: 6 RATIO
VLDL: 22 mg/dL (ref 0–40)

## 2010-07-20 LAB — COMPREHENSIVE METABOLIC PANEL
Albumin: 3.4 g/dL — ABNORMAL LOW (ref 3.5–5.2)
Alkaline Phosphatase: 63 U/L (ref 39–117)
BUN: 15 mg/dL (ref 6–23)
Chloride: 105 mEq/L (ref 96–112)
Glucose, Bld: 102 mg/dL — ABNORMAL HIGH (ref 70–99)
Potassium: 3.6 mEq/L (ref 3.5–5.1)
Total Bilirubin: 0.9 mg/dL (ref 0.3–1.2)

## 2010-07-20 LAB — POCT CARDIAC MARKERS
Myoglobin, poc: 90.7 ng/mL (ref 12–200)
Troponin i, poc: <0.05 ng/mL (ref 0.00–0.09)

## 2010-07-20 LAB — CARDIAC PANEL(CRET KIN+CKTOT+MB+TROPI): Relative Index: 1.2 (ref 0.0–2.5)

## 2010-07-20 LAB — CK TOTAL AND CKMB (NOT AT ARMC)
Relative Index: 1.6 (ref 0.0–2.5)
Total CK: 153 U/L (ref 7–232)

## 2010-07-20 LAB — TSH: TSH: 2.41 u[IU]/mL (ref 0.350–4.500)

## 2010-07-20 LAB — URINALYSIS, ROUTINE W REFLEX MICROSCOPIC
Bilirubin Urine: NEGATIVE
Hgb urine dipstick: NEGATIVE
Nitrite: NEGATIVE
Protein, ur: NEGATIVE mg/dL
Specific Gravity, Urine: 1.016 (ref 1.005–1.030)
Urobilinogen, UA: 0.2 mg/dL (ref 0.0–1.0)
pH: 6 (ref 5.0–8.0)

## 2010-07-20 LAB — DIFFERENTIAL
Basophils Relative: 0 % (ref 0–1)
Lymphocytes Relative: 18 % (ref 12–46)
Monocytes Relative: 6 % (ref 3–12)

## 2010-07-20 LAB — BRAIN NATRIURETIC PEPTIDE: Pro B Natriuretic peptide (BNP): 347 pg/mL — ABNORMAL HIGH (ref 0.0–100.0)

## 2010-07-20 LAB — RAPID URINE DRUG SCREEN, HOSP PERFORMED
Opiates: NOT DETECTED
Tetrahydrocannabinol: NOT DETECTED

## 2010-09-04 ENCOUNTER — Other Ambulatory Visit: Payer: Self-pay | Admitting: *Deleted

## 2010-09-04 MED ORDER — SIMVASTATIN 40 MG PO TABS: 40.0000 mg | ORAL_TABLET | Freq: Every evening | ORAL | Status: DC

## 2010-09-29 ENCOUNTER — Ambulatory Visit: Payer: BC Managed Care – PPO | Admitting: Cardiology

## 2010-10-05 ENCOUNTER — Encounter: Payer: Self-pay | Admitting: Cardiology

## 2010-10-05 DIAGNOSIS — I10 Essential (primary) hypertension: Secondary | ICD-10-CM | POA: Insufficient documentation

## 2010-10-22 ENCOUNTER — Ambulatory Visit: Payer: BC Managed Care – PPO | Admitting: Cardiology

## 2010-11-17 ENCOUNTER — Encounter: Payer: Self-pay | Admitting: Cardiology

## 2010-11-17 ENCOUNTER — Ambulatory Visit (INDEPENDENT_AMBULATORY_CARE_PROVIDER_SITE_OTHER): Payer: BC Managed Care – PPO | Admitting: Cardiology

## 2010-11-17 VITALS — BP 134/86 | HR 75 | Resp 16 | Ht 73.0 in | Wt 285.0 lb

## 2010-11-17 DIAGNOSIS — E7800 Pure hypercholesterolemia, unspecified: Secondary | ICD-10-CM

## 2010-11-17 DIAGNOSIS — I5022 Chronic systolic (congestive) heart failure: Secondary | ICD-10-CM

## 2010-11-17 LAB — BASIC METABOLIC PANEL
CO2: 27 mEq/L (ref 19–32)
Chloride: 110 mEq/L (ref 96–112)
Creatinine, Ser: 1.2 mg/dL (ref 0.4–1.5)
Potassium: 4.5 mEq/L (ref 3.5–5.1)

## 2010-11-17 MED ORDER — CARVEDILOL 12.5 MG PO TABS: ORAL_TABLET | ORAL | Status: DC

## 2010-11-17 NOTE — Assessment & Plan Note (Signed)
Continue simvastatin, following up with PCP.  

## 2010-11-17 NOTE — Progress Notes (Signed)
PCP: Dr. 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  He has been doing well recently.  He is working out at [employer] 3 times a week, walking on the track and using the elliptical.  He denies exertional dyspnea with mild to moderate activity.  No orthopnea or PND. He was unable to take hydralazine/Imdur due to severe headaches even with 15 mg daily of Imdur.  His weight is up 6 lbs since last appointment.   ECG: NSR, LBBB with QRS 178 msec  Labs (8/11): K 4.5, creatinine 1.3, LDL 79, HDL 26 Labs (2/12): K 4.3, creatinine 1.5  Allergies (verified):  No Known Drug Allergies  Past Medical History: 1. Cardiomyopathy: Probably nonischemic.  He had a left heart cath in 2002 or 2003 with no significant coronary disease per his report (this was done in [place]).  It sounds like his cardiomyopathy has been known for around 9-10 years.  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  Echo (5/11): severe LV dilation with EF < 20%, diffuse hypokinesis, moderate diastolic dysfunction, severe left atrial enlargement.  First hospitalization for CHF was in 5/11.  No drug use.  Adenosine myoview (6/11): EF 21%, diffuse hypokinesis, scar in the anterior and inferior walls and at the apex.  Mild peri-infarct ischemia.  LHC was done (7/11) showing EF 15%, global hypokinesis, LVEDP 22 mmHg, no angiographic CAD.   Repeat echo (10/11): EF 20-25%, severely dilated LV with diffuse hypokinesis, mild MR.  2. Hypertension 3. Chronic left bundle branch block 4. Hyperlipidemia  Family History: Family history is negative for premature diagnosis of coronary artery disease or any arrhythmia or history of heart failure in first-degree relatives.  Mother and father do have a history of hypertension  Social History: The patient lives in [place] with his wife and 2 children.   He is a Passenger transport manager for [employer].  He denies significant tobacco, EtOH, nor any illicit drug use, herbal medication  use, regular diet, and no regular exercise  Review of Systems        All systems reviewed and negative except as per HPI.   Current Outpatient Prescriptions  Medication Sig Dispense Refill  . aspirin 81 MG EC tablet Take 81 mg by mouth daily.        . fish oil-omega-3 fatty acids 1000 MG capsule Take 1 g by mouth daily.        . furosemide (LASIX) 40 MG tablet Take 40 mg by mouth daily.        Marland Kitchen lisinopril (PRINIVIL,ZESTRIL) 10 MG tablet Take 10 mg by mouth 2 (two) times daily.        . Multiple Vitamin (MULTIVITAMIN) tablet Take 1 tablet by mouth daily.        . potassium chloride SA (K-DUR,KLOR-CON) 20 MEQ tablet Take 20 mEq by mouth daily.        . simvastatin (ZOCOR) 40 MG tablet Take 1 tablet (40 mg total) by mouth every evening.  90 tablet  0  . spironolactone (ALDACTONE) 25 MG tablet Take 25 mg by mouth daily.        Marland Kitchen DISCONTD: carvedilol (COREG) 12.5 MG tablet Take 12.5 mg by mouth 2 (two) times daily with a meal.        . carvedilol (COREG) 12.5 MG tablet Take 1 and 1/2 tablets twice a day.  270 tablet  3    BP 134/86  Pulse 75  Resp 16  Ht 6\' 1"  (1.854 m)  Wt 285 lb (129.275 kg)  BMI 37.60 kg/m2 General:  Well developed, well nourished, in no acute distress.  Obese.  Neck:  Neck supple, no JVD. No masses, thyromegaly or abnormal cervical nodes. Lungs:  Clear bilaterally to auscultation and percussion. Heart:  Non-displaced PMI, chest non-tender; regular rate and rhythm, S1, S2 without murmurs, rubs or gallops. S2 is paradoxically split.  Carotid upstroke normal, no bruit.  Pedals normal pulses. No edema, no varicosities. Abdomen:  Bowel sounds positive; abdomen soft and non-tender without masses, organomegaly, or hernias noted. No hepatosplenomegaly. Extremities:  No clubbing or cyanosis. Neurologic:  Alert and oriented x 3. Psych:  Normal affect.

## 2010-11-17 NOTE — Patient Instructions (Signed)
Increase Coreg(carvedilol) to 18.75mg  twice a day. This will be one and one-half 12.5mg  tablets twice a day.  Lab today--BMP/BNP 428.22  Schedule an appointment with Dr Johney Frame.  Schedule an appointment with Dr Shirlee Latch in 4 months.

## 2010-11-17 NOTE — Assessment & Plan Note (Signed)
Stable NYHA class II symptoms.  Euvolemic on exam.  EF has been in the 25% range chronically despite med treatment.  Has not been able to tolerate hydralazine/Imdur due to severe headaches.  He has seen Dr. Johney Frame and is ready now for CRT-D implantation.  - Increase Coreg to 18.75 mg bid.   - Continue lisinopril and spironolactone at current doses.  - Will arrange appointment with Dr. Johney Frame to discuss CRT-D (think he would be a good candidate with LBBB and QRS > 150 msec).  - Followup in 4 months.   - Needs to increase exercise level with slow but steady weight gain.

## 2010-12-03 ENCOUNTER — Other Ambulatory Visit: Payer: Self-pay | Admitting: *Deleted

## 2010-12-03 MED ORDER — SPIRONOLACTONE 25 MG PO TABS: 25.0000 mg | ORAL_TABLET | Freq: Every day | ORAL | Status: DC

## 2010-12-03 MED ORDER — POTASSIUM CHLORIDE CRYS ER 20 MEQ PO TBCR: 20.0000 meq | EXTENDED_RELEASE_TABLET | Freq: Every day | ORAL | Status: DC

## 2010-12-22 ENCOUNTER — Other Ambulatory Visit: Payer: Self-pay | Admitting: Cardiology

## 2010-12-22 NOTE — Telephone Encounter (Signed)
[place] [phone].

## 2010-12-24 MED ORDER — ASPIRIN 81 MG PO TBEC: 81.0000 mg | DELAYED_RELEASE_TABLET | Freq: Every day | ORAL | Status: DC

## 2010-12-24 MED ORDER — OMEGA-3 FATTY ACIDS 1000 MG PO CAPS: 1.0000 g | ORAL_CAPSULE | Freq: Every day | ORAL | Status: AC

## 2010-12-28 ENCOUNTER — Ambulatory Visit: Payer: BC Managed Care – PPO | Admitting: Internal Medicine

## 2011-02-10 ENCOUNTER — Telehealth: Payer: Self-pay | Admitting: Cardiology

## 2011-02-10 MED ORDER — LISINOPRIL 10 MG PO TABS: 10.0000 mg | ORAL_TABLET | Freq: Two times a day (BID) | ORAL | Status: DC

## 2011-02-10 NOTE — Telephone Encounter (Signed)
Pharmacy requesting refill of lisinopril 10mg  bid 90 day supply for pt. I will send this into [facility].

## 2011-02-10 NOTE — Telephone Encounter (Signed)
Pharmacy calling about lisinipril

## 2011-03-22 ENCOUNTER — Ambulatory Visit: Payer: BC Managed Care – PPO | Admitting: Cardiology

## 2011-03-26 ENCOUNTER — Telehealth: Payer: Self-pay | Admitting: Cardiology

## 2011-03-26 DIAGNOSIS — I5022 Chronic systolic (congestive) heart failure: Secondary | ICD-10-CM

## 2011-03-26 MED ORDER — CARVEDILOL 12.5 MG PO TABS: ORAL_TABLET | ORAL | Status: DC

## 2011-03-26 MED ORDER — POTASSIUM CHLORIDE CRYS ER 20 MEQ PO TBCR: 20.0000 meq | EXTENDED_RELEASE_TABLET | Freq: Every day | ORAL | Status: DC

## 2011-03-26 MED ORDER — SPIRONOLACTONE 25 MG PO TABS: 25.0000 mg | ORAL_TABLET | Freq: Every day | ORAL | Status: DC

## 2011-03-26 NOTE — Telephone Encounter (Signed)
New problem klor-con 20 meq Spironolactone 25 mg Carvedilol 12.5 mg He wants it called to [facility] on [address] instead of mail order He also wants 90 day supply of these meds

## 2011-04-13 ENCOUNTER — Ambulatory Visit: Payer: BC Managed Care – PPO | Admitting: Cardiology

## 2011-04-14 ENCOUNTER — Ambulatory Visit (INDEPENDENT_AMBULATORY_CARE_PROVIDER_SITE_OTHER): Payer: BC Managed Care – PPO | Admitting: Cardiology

## 2011-04-14 VITALS — BP 108/72 | HR 59 | Ht 73.0 in | Wt 253.0 lb

## 2011-04-14 DIAGNOSIS — I5022 Chronic systolic (congestive) heart failure: Secondary | ICD-10-CM

## 2011-04-14 DIAGNOSIS — E7800 Pure hypercholesterolemia, unspecified: Secondary | ICD-10-CM

## 2011-04-14 MED ORDER — LISINOPRIL 10 MG PO TABS: ORAL_TABLET | ORAL | Status: DC

## 2011-04-14 NOTE — Progress Notes (Signed)
PCP: Dr. 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  He has been doing well recently.  He is working out at [employer] 4-5 times a week, walking on the track and using the elliptical.  He has lost 32 lbs since last appointment with diet and exercise.  He denies exertional dyspnea with mild to moderate activity.  No orthopnea or PND. He was unable to take hydralazine/Imdur due to severe headaches even with 15 mg daily of Imdur.   ECG: NSR, LBBB with QRS 182 msec  Labs (8/11): K 4.5, creatinine 1.3, LDL 79, HDL 26 Labs (2/12): K 4.3, creatinine 1.5 Labs (7/12): K 4.5, creatinine 1.2  Allergies (verified):  No Known Drug Allergies  Past Medical History: 1. Cardiomyopathy: Probably nonischemic.  He had a left heart cath in 2002 or 2003 with no significant coronary disease per his report (this was done in [place]).  It sounds like his cardiomyopathy has been known for around 9-10 years.  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  Echo (5/11): severe LV dilation with EF < 20%, diffuse hypokinesis, moderate diastolic dysfunction, severe left atrial enlargement.  First hospitalization for CHF was in 5/11.  No drug use.  Adenosine myoview (6/11): EF 21%, diffuse hypokinesis, scar in the anterior and inferior walls and at the apex.  Mild peri-infarct ischemia.  LHC was done (7/11) showing EF 15%, global hypokinesis, LVEDP 22 mmHg, no angiographic CAD.   Repeat echo (10/11): EF 20-25%, severely dilated LV with diffuse hypokinesis, mild MR.  2. Hypertension 3. Chronic left bundle branch block 4. Hyperlipidemia  Family History: Family history is negative for premature diagnosis of coronary artery disease or any arrhythmia or history of heart failure in first-degree relatives.  Mother and father do have a history of hypertension  Social History: The patient lives in [place]  with his wife and 2 children.   He is a Data processing manager for [employer].  He denies significant tobacco, EtOH, nor any illicit drug use, herbal medication  use, regular diet, and no regular exercise  Review of Systems        All systems reviewed and negative except as per HPI.   Current Outpatient Prescriptions  Medication Sig Dispense Refill  . aspirin 81 MG EC tablet Take 1 tablet (81 mg total) by mouth daily.  30 tablet  2  . carvedilol (COREG) 12.5 MG tablet Take 1 and 1/2 tablets twice a day.  270 tablet  3  . fish oil-omega-3 fatty acids 1000 MG capsule Take 1 capsule (1 g total) by mouth daily.  30 capsule  2  . furosemide (LASIX) 40 MG tablet Take 40 mg by mouth daily.        . Multiple Vitamin (MULTIVITAMIN) tablet Take 1 tablet by mouth daily.        . potassium chloride SA (K-DUR,KLOR-CON) 20 MEQ tablet Take 1 tablet (20 mEq total) by mouth daily.  90 tablet  3  . simvastatin (ZOCOR) 40 MG tablet Take 1 tablet (40 mg total) by mouth every evening.  90 tablet  0  . spironolactone (ALDACTONE) 25 MG tablet Take 1 tablet (25 mg total) by mouth daily.  90 tablet  3  . DISCONTD: lisinopril (PRINIVIL,ZESTRIL) 10 MG tablet Take 1 tablet (10 mg total) by mouth 2 (two) times daily.  180 tablet  3  .  lisinopril (PRINIVIL,ZESTRIL) 10 MG tablet Take 1 and 1/2 tablets twice a day  270 tablet  3    BP 108/72  Pulse 59  Ht 6\' 1"  (1.854 m)  Wt 114.76 kg (253 lb)  BMI 33.38 kg/m2  SpO2 99% General:  Well developed, well nourished, in no acute distress.  Obese.  Neck:  Neck supple, no JVD. No masses, thyromegaly or abnormal cervical nodes. Lungs:  Clear bilaterally to auscultation and percussion. Heart:  Non-displaced PMI, chest non-tender; regular rate and rhythm, S1, S2 without murmurs, rubs or gallops. S2 is paradoxically split.  Carotid upstroke normal, no bruit.  Pedals normal pulses. No edema, no varicosities. Abdomen:  Bowel sounds positive; abdomen soft and non-tender  without masses, organomegaly, or hernias noted. No hepatosplenomegaly. Extremities:  No clubbing or cyanosis. Neurologic:  Alert and oriented x 3. Psych:  Normal affect.

## 2011-04-14 NOTE — Patient Instructions (Signed)
Increase lisinopril to 15mg  twice a day. This will be one and one-half 10mg  tablets twice a day.  Your physician has requested that you have an echocardiogram. Echocardiography is a painless test that uses sound waves to create images of your heart. It provides your doctor with information about the size and shape of your heart and how well your heart's chambers and valves are working. This procedure takes approximately one hour. There are no restrictions for this procedure.  Your physician recommends that you return for lab work in: 2 weeks--BMET 428.22  Reschedule the appointment with Dr Johney Frame for evaluation for ICD.  Your physician recommends that you schedule a follow-up appointment in: 4 months with Dr Shirlee Latch.(April 2013).

## 2011-04-15 ENCOUNTER — Encounter: Payer: Self-pay | Admitting: Cardiology

## 2011-04-15 NOTE — Assessment & Plan Note (Signed)
Stable NYHA class I-II symptoms.  Euvolemic on exam.  EF has been in the 25% range chronically despite med treatment.  Has not been able to tolerate hydralazine/Imdur due to severe headaches.    - Continue Coreg and spironolactone at current doses.  - Increase lisinopril to 15 mg bid with BMET in 2 weeks.  - Will arrange appointment with Dr. Johney Frame to discuss CRT-D (think he would be a good candidate with LBBB and QRS > 150 msec).  I will get a pre-implantation echo as he has not had an echo in over a year, but I suspect EF will still be < 35%.   - Followup in 4 months.

## 2011-04-15 NOTE — Assessment & Plan Note (Signed)
Continue simvastatin, following up with PCP.

## 2011-04-19 ENCOUNTER — Other Ambulatory Visit (HOSPITAL_COMMUNITY): Payer: BC Managed Care – PPO | Admitting: Radiology

## 2011-04-26 ENCOUNTER — Ambulatory Visit (HOSPITAL_COMMUNITY): Payer: BC Managed Care – PPO | Attending: Cardiology | Admitting: Radiology

## 2011-04-26 ENCOUNTER — Other Ambulatory Visit (INDEPENDENT_AMBULATORY_CARE_PROVIDER_SITE_OTHER): Payer: BC Managed Care – PPO | Admitting: *Deleted

## 2011-04-26 ENCOUNTER — Telehealth: Payer: Self-pay | Admitting: Cardiology

## 2011-04-26 DIAGNOSIS — I10 Essential (primary) hypertension: Secondary | ICD-10-CM | POA: Insufficient documentation

## 2011-04-26 DIAGNOSIS — I5022 Chronic systolic (congestive) heart failure: Secondary | ICD-10-CM

## 2011-04-26 DIAGNOSIS — I428 Other cardiomyopathies: Secondary | ICD-10-CM | POA: Insufficient documentation

## 2011-04-26 DIAGNOSIS — I447 Left bundle-branch block, unspecified: Secondary | ICD-10-CM | POA: Insufficient documentation

## 2011-04-26 DIAGNOSIS — E669 Obesity, unspecified: Secondary | ICD-10-CM | POA: Insufficient documentation

## 2011-04-26 DIAGNOSIS — E7800 Pure hypercholesterolemia, unspecified: Secondary | ICD-10-CM | POA: Insufficient documentation

## 2011-04-26 LAB — BASIC METABOLIC PANEL
CO2: 29 mEq/L (ref 19–32)
Calcium: 9 mg/dL (ref 8.4–10.5)
Chloride: 106 mEq/L (ref 96–112)
Glucose, Bld: 100 mg/dL — ABNORMAL HIGH (ref 70–99)
Potassium: 4.1 mEq/L (ref 3.5–5.1)
Sodium: 141 mEq/L (ref 135–145)

## 2011-04-26 MED ORDER — LISINOPRIL 10 MG PO TABS: ORAL_TABLET | ORAL | Status: DC

## 2011-04-26 NOTE — Telephone Encounter (Signed)
New message:  Pres called into [facility] for Lisinipril but he can get it cheaper thru mail order.  Please call new pres. Into his mail order it is on file, patient could not remember.  90 day with refills.

## 2011-05-11 ENCOUNTER — Ambulatory Visit (INDEPENDENT_AMBULATORY_CARE_PROVIDER_SITE_OTHER): Payer: BC Managed Care – PPO

## 2011-05-11 DIAGNOSIS — M79609 Pain in unspecified limb: Secondary | ICD-10-CM

## 2011-05-11 DIAGNOSIS — E669 Obesity, unspecified: Secondary | ICD-10-CM

## 2011-05-11 DIAGNOSIS — S92919A Unspecified fracture of unspecified toe(s), initial encounter for closed fracture: Secondary | ICD-10-CM

## 2011-05-31 ENCOUNTER — Ambulatory Visit: Payer: BC Managed Care – PPO | Admitting: Internal Medicine

## 2011-07-23 IMAGING — CR DG CHEST 1V PORT
1 series · 1 of 1 positions shown · non-contrast
Comparison: 09/29/2009.

CLINICAL DATA: 43-year-old male with congestive heart failure.

PORTABLE CHEST - 1 VIEW

[view not recorded]
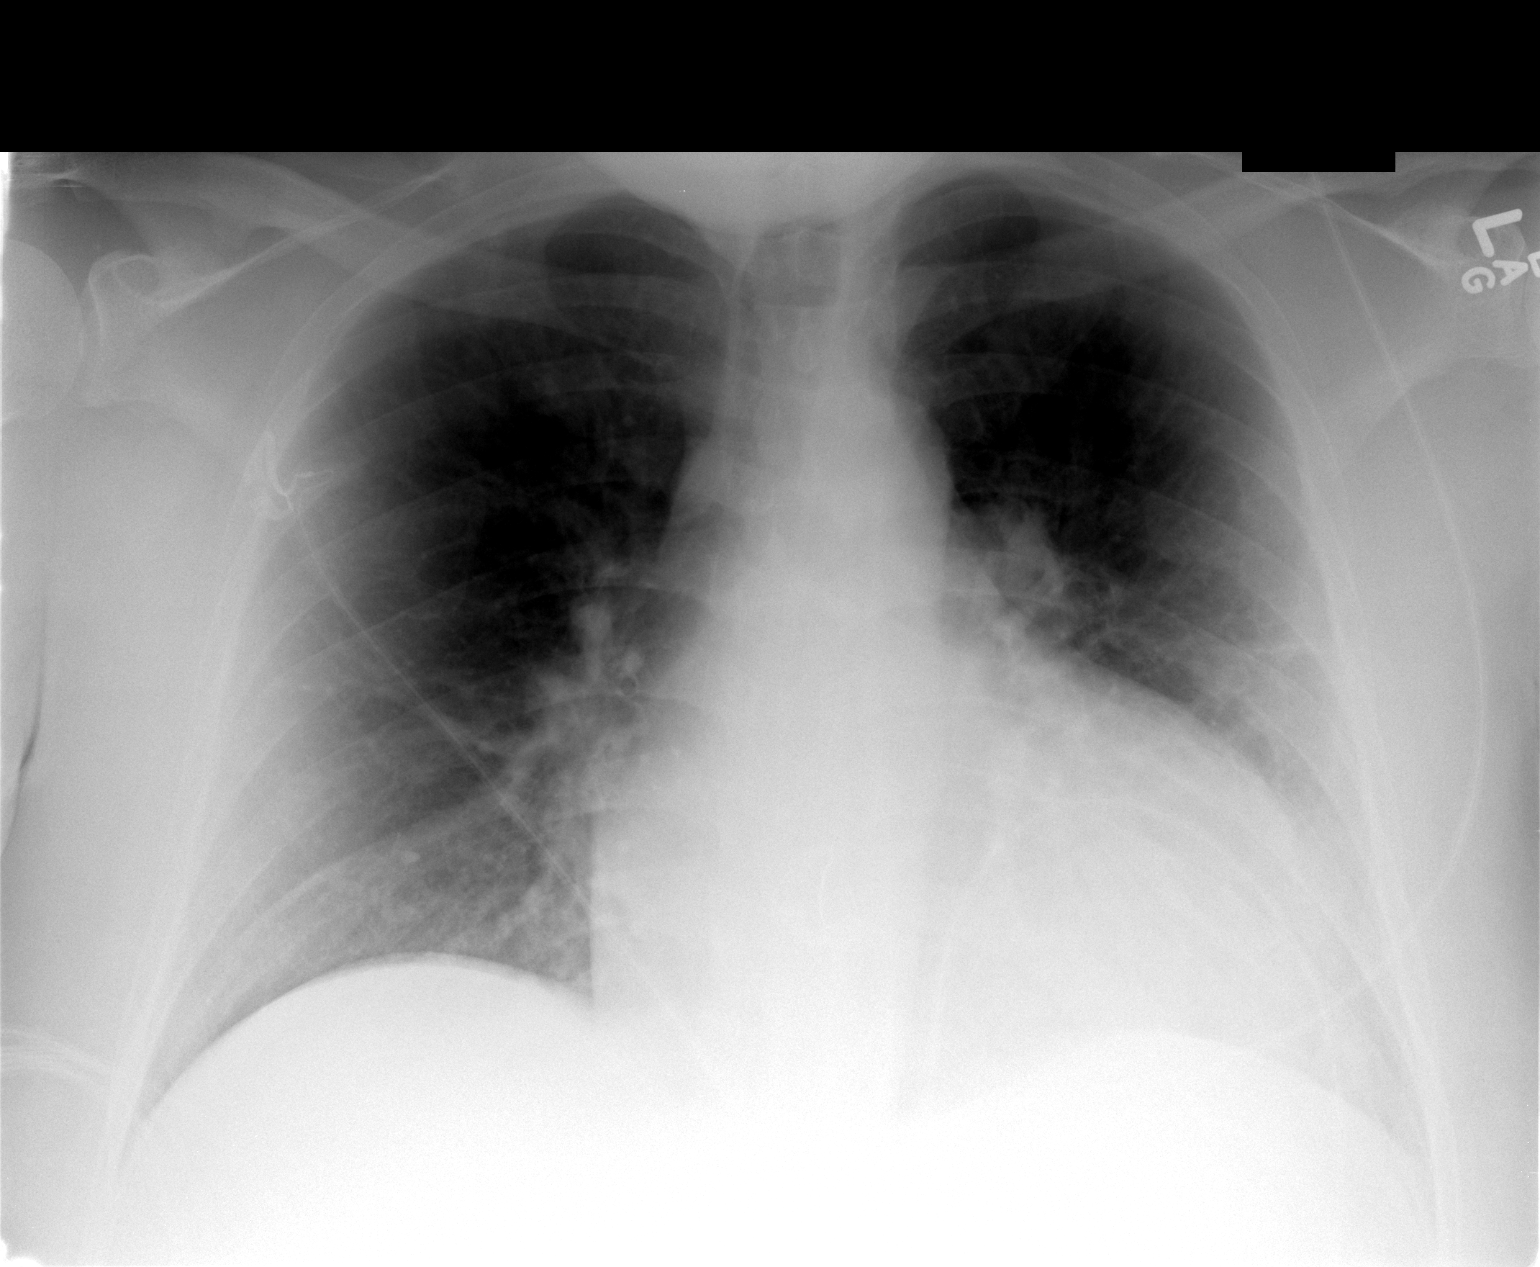

[1 of 1 positions shown; findings below may reference images not displayed]

FINDINGS: Portable AP upright view 1211 hours. Stable cardiomegaly
and mediastinal contours.  Improved lung volumes and bibasilar
ventilation.  No pneumothorax, pleural effusion or consolidation.
Interval decrease pulmonary edema.  Mild residual vascular
congestion.
IMPRESSION: Decrease pulmonary edema and improved lung volumes.

## 2011-09-14 ENCOUNTER — Other Ambulatory Visit: Payer: Self-pay

## 2011-09-14 MED ORDER — SIMVASTATIN 40 MG PO TABS: 40.0000 mg | ORAL_TABLET | Freq: Every evening | ORAL | Status: DC

## 2011-09-14 NOTE — Telephone Encounter (Signed)
..   Requested Prescriptions   Signed Prescriptions Disp Refills  . simvastatin (ZOCOR) 40 MG tablet 90 tablet 12    Sig: Take 1 tablet (40 mg total) by mouth every evening.    Authorizing Provider: Laurey Morale    Ordering User: Christella Hartigan, Greysen Devino Judie Petit

## 2011-12-13 ENCOUNTER — Other Ambulatory Visit: Payer: Self-pay | Admitting: *Deleted

## 2011-12-13 MED ORDER — FUROSEMIDE 40 MG PO TABS: 40.0000 mg | ORAL_TABLET | Freq: Every day | ORAL | Status: DC

## 2012-01-05 ENCOUNTER — Other Ambulatory Visit: Payer: Self-pay | Admitting: *Deleted

## 2012-01-05 MED ORDER — POTASSIUM CHLORIDE CRYS ER 20 MEQ PO TBCR: 20.0000 meq | EXTENDED_RELEASE_TABLET | Freq: Every day | ORAL | Status: DC

## 2012-04-12 ENCOUNTER — Ambulatory Visit (INDEPENDENT_AMBULATORY_CARE_PROVIDER_SITE_OTHER): Payer: BC Managed Care – PPO | Admitting: Orthopedic Surgery

## 2012-04-12 ENCOUNTER — Encounter: Payer: Self-pay | Admitting: Orthopedic Surgery

## 2012-04-12 ENCOUNTER — Encounter: Payer: Self-pay | Admitting: Physician Assistant

## 2012-04-12 ENCOUNTER — Ambulatory Visit (INDEPENDENT_AMBULATORY_CARE_PROVIDER_SITE_OTHER): Payer: BC Managed Care – PPO | Admitting: Physician Assistant

## 2012-04-12 VITALS — BP 123/78 | HR 73 | Temp 98.1°F | Resp 16 | Ht 72.5 in | Wt 287.0 lb

## 2012-04-12 VITALS — BP 120/82 | Ht 73.0 in | Wt 286.0 lb

## 2012-04-12 DIAGNOSIS — I5022 Chronic systolic (congestive) heart failure: Secondary | ICD-10-CM

## 2012-04-12 DIAGNOSIS — Z0000 Encounter for general adult medical examination without abnormal findings: Secondary | ICD-10-CM

## 2012-04-12 DIAGNOSIS — E7800 Pure hypercholesterolemia, unspecified: Secondary | ICD-10-CM

## 2012-04-12 DIAGNOSIS — R400 Somnolence: Secondary | ICD-10-CM

## 2012-04-12 DIAGNOSIS — Z125 Encounter for screening for malignant neoplasm of prostate: Secondary | ICD-10-CM

## 2012-04-12 DIAGNOSIS — Z23 Encounter for immunization: Secondary | ICD-10-CM

## 2012-04-12 DIAGNOSIS — S53449A Ulnar collateral ligament sprain of unspecified elbow, initial encounter: Secondary | ICD-10-CM

## 2012-04-12 DIAGNOSIS — E669 Obesity, unspecified: Secondary | ICD-10-CM

## 2012-04-12 DIAGNOSIS — I10 Essential (primary) hypertension: Secondary | ICD-10-CM

## 2012-04-12 LAB — COMPREHENSIVE METABOLIC PANEL
ALT: 21 U/L (ref 0–53)
CO2: 24 mEq/L (ref 19–32)
Calcium: 9.6 mg/dL (ref 8.4–10.5)
Chloride: 105 mEq/L (ref 96–112)
Creat: 1.26 mg/dL (ref 0.50–1.35)
Glucose, Bld: 101 mg/dL — ABNORMAL HIGH (ref 70–99)
Total Bilirubin: 0.5 mg/dL (ref 0.3–1.2)
Total Protein: 6.9 g/dL (ref 6.0–8.3)

## 2012-04-12 LAB — LIPID PANEL
Cholesterol: 165 mg/dL (ref 0–200)
Triglycerides: 131 mg/dL (ref <150)
VLDL: 26 mg/dL (ref 0–40)

## 2012-04-12 LAB — IFOBT (OCCULT BLOOD): IFOBT: NEGATIVE

## 2012-04-12 LAB — CBC WITH DIFFERENTIAL/PLATELET
Eosinophils Absolute: 0.2 10*3/uL (ref 0.0–0.7)
Eosinophils Relative: 3 % (ref 0–5)
HCT: 39.6 % (ref 39.0–52.0)
Hemoglobin: 13.9 g/dL (ref 13.0–17.0)
Lymphocytes Relative: 22 % (ref 12–46)
Lymphs Abs: 1.5 10*3/uL (ref 0.7–4.0)
MCH: 30.3 pg (ref 26.0–34.0)
MCV: 86.3 fL (ref 78.0–100.0)
Monocytes Relative: 9 % (ref 3–12)
RBC: 4.59 MIL/uL (ref 4.22–5.81)
WBC: 6.7 10*3/uL (ref 4.0–10.5)

## 2012-04-12 LAB — TSH: TSH: 2.39 u[IU]/mL (ref 0.350–4.500)

## 2012-04-12 LAB — POCT URINALYSIS DIPSTICK
Bilirubin, UA: NEGATIVE
Blood, UA: NEGATIVE
Glucose, UA: NEGATIVE
Ketones, UA: NEGATIVE
Spec Grav, UA: 1.015

## 2012-04-12 MED ORDER — FUROSEMIDE 40 MG PO TABS: 40.0000 mg | ORAL_TABLET | Freq: Every day | ORAL | Status: DC

## 2012-04-12 MED ORDER — POTASSIUM CHLORIDE CRYS ER 20 MEQ PO TBCR: 20.0000 meq | EXTENDED_RELEASE_TABLET | Freq: Every day | ORAL | Status: DC

## 2012-04-12 MED ORDER — SPIRONOLACTONE 25 MG PO TABS: 25.0000 mg | ORAL_TABLET | Freq: Every day | ORAL | Status: DC

## 2012-04-12 MED ORDER — CARVEDILOL 12.5 MG PO TABS: ORAL_TABLET | ORAL | Status: DC

## 2012-04-12 MED ORDER — LISINOPRIL 10 MG PO TABS: ORAL_TABLET | ORAL | Status: DC

## 2012-04-12 MED ORDER — SIMVASTATIN 40 MG PO TABS: 40.0000 mg | ORAL_TABLET | Freq: Every evening | ORAL | Status: DC

## 2012-04-12 NOTE — Patient Instructions (Addendum)
Referral to [facility] orthopaedics Dr Butler Denmark   For Game keepers Hurley Cisco thumb Left hand Ulnar Collateral Ligament Injury of the Thumb   Ulnar collateral ligament (UCL) injury occurs when the UCL ligament at the base of the thumb is stretched or torn. The UCL ligament is important for normal use of the thumb. This ligament helps in motions, such as grabbing or pinching. Sprains are classified into 3 categories:    Grade 1 sprains cause pain, but the tendon is not lengthened.   Grade 2 sprains include a lengthened ligament due to the ligament being stretched or partially ruptured. With grade 2 sprains there is still function, although the function may be diminished.   Grade 3 sprains are marked by a complete tear of the ligament. The joint usually displays a loss of function.  SYMPTOMS  Pain, tenderness, bruising, swelling, and redness at the base of the thumb, starting at the side of injury, that may progress to the whole thumb and even hand with time.   Impaired ability to grasp or hold things soon after injury.  CAUSES   A UCL injury is caused by forcefully moving the thumb past its normal range of motion. This most commonly occurs when falling onto outstretched hands, while holding on to a ski pole, or in baseball (when making an awkward catch). Normally, the UCL prevents the thumb from straightening the thumb towards the forearm or wrist.   RISK INCREASES WITH:  Previous thumb injury or sprain.   Skiing with ski poles.   Contact sports, especially catching sports (baseball, basketball, or football).   Sports in which the thumb may be pulled away from the rest of the hand.   Poor hand strength and flexibility.  PREVENTION    Learn and use proper technique when catching and/or when falling while skiing.   Taping, protective strapping, bracing, or other equipment can prevent the thumb from being pulled away from the rest of the hand.   Allow complete healing before returning to  activities.  PROGNOSIS   The length of healing time depends on the severity of injury. In general:  Grade 1 sprains usually heal enough in 5 to 7 days to allow for modified activity. Grade 1 requires an average of 6 weeks to heal completely.   Grade 2 sprains require 6 to 10 weeks to heal completely.   Grade 3 sprains often require 12 to 16 weeks to heal, although surgery may be recommended.  RELATED COMPLICATIONS    Frequent recurrence of symptoms, resulting in a chronic problem.   Injury to other structures (ie. bone, cartilage, or tendon).   Chronically unstable or arthritic thumb joint.   Prolonged disability, particularly inability to pinch, grasp, or grip with any strength.   Delayed healing or resolution of symptoms, particularly if activity is resumed too soon.   Risks of surgery, including infection, bleeding, injury to nerves (numbness, weakness, or paralysis), looseness of the ligament and weakness of pinching, thumb stiffness, and pain.  TREATMENT Treatment initially consists of ice, medicine, and compressive bandaging to help reduce pain and inflammation. The thumb and wrist should be restrained (immobilized) to allow for healing. If the tear is complete, then surgery is often necessary to repair the damaged ligament. MEDICATION    If pain medicine is necessary, then nonsteroidal anti-inflammatory medicine, such as aspirin and ibuprofen, or other minor pain relievers, such as acetaminophen, are often recommended.   Do not take pain medication for 7 days before surgery.   Prescription pain  relievers may be prescribed. Use only as directed and only as much as you need.  HEAT AND COLD  Cold treatment (icing) relieves pain and reduces inflammation. Cold treatment should be applied for 10 to 15 minutes every 2 to 3 hours for inflammation and pain and immediately after any activity that aggravates your symptoms. Use ice packs or massage the area with a piece of ice (ice  massage).   Heat treatment may be used prior to performing the stretching and strengthening activities prescribed by your caregiver, physical therapist, or athletic trainer. Use a heat pack or a warm soak.  SEEK MEDICAL CARE IF:    Pain, swelling, or bruising worsens despite treatment.   You experience pain, numbness, discoloration, or coldness in the hand or thumb.   After surgery you develop fever, increasing pain, redness, swelling, drainage or bleeding, or increasing warmth.   New, unexplained symptoms develop (drugs used in treatment may produce side effects).  Document Released: 04/19/2005 Document Revised: 07/12/2011 Document Reviewed: 08/01/2008 [facility] Patient Information 2013 [place], [place].

## 2012-04-12 NOTE — Progress Notes (Signed)
Subjective:    Patient ID: Gary Williamson, male    DOB: 03-Jun-1965, 46 y.o.   MRN: [id number]  HPI  46 yr old AAM presents for CPE.  Denies any problems.  His SH, FH, Sx history is reviewed and added to the chart.  He does admit to daytime somnolence. He is stable and compliant on all his meds.  Review of Systems  All other systems reviewed and are negative.       Objective:   Physical Exam  Nursing note and vitals reviewed. Constitutional: He is oriented to person, place, and time. He appears well-developed and well-nourished.       Obese male  HENT:  Head: Normocephalic and atraumatic.  Right Ear: External ear normal.  Left Ear: External ear normal.  Nose: Nose normal.  Mouth/Throat: Oropharynx is clear and moist. No oropharyngeal exudate.       mallampati 3  Eyes: Conjunctivae normal and EOM are normal. Pupils are equal, round, and reactive to light. Right eye exhibits no discharge. Left eye exhibits no discharge. No scleral icterus.       Fundi benign  Neck: Normal range of motion. Neck supple. No JVD present. No tracheal deviation present. No thyromegaly present.  Cardiovascular: Normal rate, regular rhythm, normal heart sounds and intact distal pulses.  Exam reveals no gallop and no friction rub.   No murmur heard. Pulmonary/Chest: Effort normal and breath sounds normal. No respiratory distress. He has no wheezes. He has no rales. He exhibits no tenderness.  Abdominal: Soft. Bowel sounds are normal. He exhibits no distension and no mass. There is no tenderness. There is no rebound and no guarding.       Exam limited by obesity.  Genitourinary: Rectum normal, prostate normal and penis normal. Guaiac negative stool. No penile tenderness.       No hernia or testicular mass.  Musculoskeletal: Normal range of motion. He exhibits edema (he does exhibit 1+pretibial edema). He exhibits no tenderness.  Lymphadenopathy:    He has no cervical adenopathy.  Neurological: He is alert  and oriented to person, place, and time. He has normal reflexes. He displays normal reflexes. No cranial nerve deficit. He exhibits normal muscle tone. Coordination normal.  Skin: Skin is warm and dry.  Psychiatric: He has a normal mood and affect. His behavior is normal. Judgment and thought content normal.   Results for orders placed in visit on 04/12/12  POCT URINALYSIS DIPSTICK      Component Value Range   Color, UA yellow     Clarity, UA clear     Glucose, UA neg     Bilirubin, UA neg     Ketones, UA neg     Spec Grav, UA 1.015     Blood, UA neg     pH, UA 5.5     Protein, UA neg     Urobilinogen, UA 0.2     Nitrite, UA neg     Leukocytes, UA Negative    IFOBT (OCCULT BLOOD)      Component Value Range   IFOBT Negative          Assessment & Plan:  CPE mallampati 3-I suspect sleep apnea is an issue and contributing to his overall health issues including obesity, metabolic dysfunction, hypertension, etc.  I am referring for a sleep study. Hypertension-stable on meds Hyperlipidemia Check labs today.  Obtain sleep study.  Hopefully, getting him on CPAP if indicated will provide some help in other medical condition management and  in the long term allow for changes in medications/decreasing doses, etc. meds refilled. Tdap given

## 2012-04-13 ENCOUNTER — Encounter: Payer: Self-pay | Admitting: Orthopedic Surgery

## 2012-04-13 DIAGNOSIS — S53449A Ulnar collateral ligament sprain of unspecified elbow, initial encounter: Secondary | ICD-10-CM | POA: Insufficient documentation

## 2012-04-13 NOTE — Progress Notes (Signed)
Patient ID: Gary Williamson, male   DOB: 1965-12-28, 46 y.o.   MRN: [id number] Chief Complaint  Patient presents with  . Hand Pain    Right thumb pain, DOI 10/13.    This patient injured his right thumb on October 20 13 when he fell out of a can new. He noted immediate pain on illness out of his thumb at the metacarpophalangeal joint associated with swelling. His pain decreased somewhat but he still has throbbing constant pain which is worse with movement. It does not appear that he is having any significant pinch problem. He is left-hand dominant.  He has a negative review of systems  Past Medical History  Diagnosis Date  . Cardiomyopathy     Probably nonischemic; Left heart cath in 2002/03 no sig CAD per report (done in [facility]). Cardiomyopathy for 9-10 yrs. 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 Echo 5/11: severe LV dilation with EF <20%, diffuse hypokinesis, mod dias dys, sev. LA enlargement. Hosp for CHF in 5/11. Adenosine myoiew 6/11: EF 21%, mild per-infart ischemia. LHC done 7/11 show EF 15%,  . Hypertension   . LBBB (left bundle branch block)     Chronic  . Hyperlipidemia     Physical Exam(12)  Vital signs:   GENERAL: normal development   CDV: pulses are normal   Skin: normal  Lymph: nodes were not palpable/normal  Psychiatric: awake, alert and oriented  Neuro: normal sensation  MSK  Gait: Normal walking pattern 1 Inspection of the right and left thumb for comparison indicate that the right thumb there is tenderness over the elbow collateral ligament there is normal flexion extension normal pinch strength but definite instability in extension and in flexion with pain noted in flexion  He has a stable left ulnar collateral ligament and there is a definite difference between the 2 joints contrast   Imaging x-rays were negative he has a disc with him that his return to him for further followup  Assessment: Ulnar collateral ligament injury  right thumb    Plan: Referral to a hand specialist for repair

## 2012-04-18 ENCOUNTER — Telehealth: Payer: Self-pay | Admitting: *Deleted

## 2012-04-18 NOTE — Telephone Encounter (Signed)
I faxed the referral to Dr. Cyndee Brightly

## 2012-04-28 ENCOUNTER — Telehealth: Payer: Self-pay

## 2012-04-28 NOTE — Telephone Encounter (Signed)
PT WAS TO CALL BACK AND GIVE [name] THE NAME OF THE PHARMACY HE USES AND [facility] IS [facility] CALL PT AT [phone] IF QUESTIONS

## 2012-05-07 ENCOUNTER — Telehealth: Payer: Self-pay

## 2012-05-07 NOTE — Telephone Encounter (Signed)
Patient had asked [name] to change his mail order pharmacy to [facility] in [place] for his rxs and they have not received it yet. Can we please resend this?

## 2012-05-08 NOTE — Telephone Encounter (Signed)
What does he need? Left message for him to call back and advise

## 2012-05-09 NOTE — Telephone Encounter (Signed)
Looks like medications went to [facility] in [place].  Does pt want meds to go to [facility] in [place]?  I do not see a [facility] pharmacy in the computer

## 2012-05-09 NOTE — Telephone Encounter (Signed)
Spoke with pt, he needs the simvastatin, lisinopril, klor-con, furosemide sent to this pharmacy.

## 2012-05-10 NOTE — Telephone Encounter (Signed)
Called and spoke w/pt's wife and asked her if she could verify if pharmacy is [facility] or [facility]. Wife was not sure and stated that she will call her husband at work and have him call us w/clarification.

## 2012-05-10 NOTE — Telephone Encounter (Signed)
Pt's wife CB and stated that she txt'd pt and asked him which mail order and he advised her to tell us it is definitely in [place] so he believes it is the [facility]. I advised wife that I will re-send his Rxs there.  Marylene Land, is it OK to send Rx for furosemide to his mail order? It looks like on the 04/12/12 OV you had sent in only #30 w/3 RFs and said pt needs f/up. I wasn't sure if we can order only #30 through mail order or if it needs to be #90? Please advise.

## 2012-05-12 ENCOUNTER — Telehealth: Payer: Self-pay

## 2012-05-12 DIAGNOSIS — I5022 Chronic systolic (congestive) heart failure: Secondary | ICD-10-CM

## 2012-05-12 MED ORDER — LISINOPRIL 10 MG PO TABS: ORAL_TABLET | ORAL | Status: DC

## 2012-05-12 MED ORDER — FUROSEMIDE 40 MG PO TABS: 40.0000 mg | ORAL_TABLET | Freq: Every day | ORAL | Status: DC

## 2012-05-12 MED ORDER — SIMVASTATIN 40 MG PO TABS: 40.0000 mg | ORAL_TABLET | Freq: Every evening | ORAL | Status: DC

## 2012-05-12 MED ORDER — POTASSIUM CHLORIDE CRYS ER 20 MEQ PO TBCR: 20.0000 meq | EXTENDED_RELEASE_TABLET | Freq: Every day | ORAL | Status: DC

## 2012-05-12 NOTE — Telephone Encounter (Signed)
Pt called again and had [facility] on the phone with him regarding rx issues. Asked to speak with Amy who was unavailable at the time. He states that [facility] said we could send his rx's to the [place] location and they would get to him.   Pt best: Apr 27, 2066  bf

## 2012-05-12 NOTE — Telephone Encounter (Signed)
PATIENT IS RETURNING OUR CALL. HE STATES HIS WIFE TOLD HIM WE CALLED TO FIND OUT WHICH MAIL-IN PHARMACY HE USES? ALL THE PAPER WORK HE CAN FIND SAYS [place].  BEST PHONE [phone] (CELL)  MBC

## 2012-05-12 NOTE — Telephone Encounter (Signed)
This is not the correct pharmacy, he is advised we should print the Rx's off for him to pick up and he can mail to the Mail order pharmacy.

## 2012-05-12 NOTE — Telephone Encounter (Signed)
Yes, that's fine.  Thanks 1

## 2012-05-12 NOTE — Telephone Encounter (Signed)
Sent in the four Rxs requested to [facility] in [place] as pt requested and notified pt done. See notes also under 05/07/12 phone message.

## 2012-05-14 ENCOUNTER — Encounter (HOSPITAL_BASED_OUTPATIENT_CLINIC_OR_DEPARTMENT_OTHER): Payer: BC Managed Care – PPO

## 2012-05-16 ENCOUNTER — Ambulatory Visit (HOSPITAL_BASED_OUTPATIENT_CLINIC_OR_DEPARTMENT_OTHER): Payer: BC Managed Care – PPO | Attending: Physician Assistant | Admitting: Radiology

## 2012-05-16 VITALS — Ht 73.0 in | Wt 286.0 lb

## 2012-05-16 DIAGNOSIS — R400 Somnolence: Secondary | ICD-10-CM

## 2012-05-16 DIAGNOSIS — I10 Essential (primary) hypertension: Secondary | ICD-10-CM

## 2012-05-16 DIAGNOSIS — E669 Obesity, unspecified: Secondary | ICD-10-CM

## 2012-05-16 DIAGNOSIS — G4733 Obstructive sleep apnea (adult) (pediatric): Secondary | ICD-10-CM

## 2012-05-20 DIAGNOSIS — R0989 Other specified symptoms and signs involving the circulatory and respiratory systems: Secondary | ICD-10-CM

## 2012-05-20 DIAGNOSIS — G4733 Obstructive sleep apnea (adult) (pediatric): Secondary | ICD-10-CM

## 2012-05-20 DIAGNOSIS — R0609 Other forms of dyspnea: Secondary | ICD-10-CM

## 2012-05-20 NOTE — Procedures (Signed)
Gary Williamson, ORTWEIN NO.:  [id number]  MEDICAL RECORD NO.:  [id number]          PATIENT TYPE:  OUT  LOCATION:  SLEEP CENTER                 FACILITY:  [facility]  PHYSICIAN:  Clinton D. Maple Hudson, MD, FCCP, FACPDATE OF BIRTH:  Dec 09, 1965  DATE OF STUDY:  05/16/2012                           NOCTURNAL POLYSOMNOGRAM  REFERRING PHYSICIAN:  Georgian Co, PA  INDICATION FOR STUDY:  Hypersomnia with sleep apnea.  EPWORTH SLEEPINESS SCORE:  3/24.  BMI 37.7, weight 286 pounds, height 73 inches, neck 17 inches.  MEDICATIONS:  Home medications are charted and reviewed.  SLEEP ARCHITECTURE:  Total sleep time 338.5 minutes with sleep efficiency 85.5%.  Stage I was 7.8%, stage II 88.6%, stage III 3.5%, REM absent.  Sleep latency 35.5 minutes, REM latency NA.  Awake after sleep onset 23 minutes.  Arousal index 22.7.  Bedtime Medication:  None.  RESPIRATORY DATA:  Apnea-hypopnea index (AHI) 17 per hour.  A total of 96 events were scored including 14 obstructive apneas, 3 central apneas, 1 mixed apnea, 78 hypopneas.  Events were seen in all sleep positions. The study was ordered as a diagnostic NPSG protocol without CPAP.  OXYGEN DATA:  Moderate snoring with oxygen desaturation to a nadir of 83% and mean oxygen saturation through the study of 94% on room air.  CARDIAC DATA:  Sinus rhythm with occasional PVC.  MOVEMENT-PARASOMNIA:  No significant movement disturbance.  No bathroom trips.  IMPRESSIONS-RECOMMENDATIONS: 1. Moderate obstructive sleep apnea/hypopnea syndrome, AHI 17 per hour     with events in all positions.  Moderate snoring with oxygen     desaturation to a nadir of 83% and mean oxygen saturation through     the study of 94% on room air. 2. The study was ordered as a diagnostic NPSG protocol.  If     appropriate, the patient can return for a dedicated CPAP titration     study.     Clinton D. Maple Hudson, MD, [facility], FACP Diplomate, American Board of Sleep  Medicine    CDY/MEDQ  D:  05/20/2012 09:21:08  T:  05/20/2012 09:40:04  Job:  [id number]

## 2012-05-22 ENCOUNTER — Other Ambulatory Visit: Payer: Self-pay | Admitting: *Deleted

## 2012-05-22 DIAGNOSIS — IMO0001 Reserved for inherently not codable concepts without codable children: Secondary | ICD-10-CM

## 2012-06-08 ENCOUNTER — Telehealth: Payer: Self-pay

## 2012-06-08 DIAGNOSIS — G4730 Sleep apnea, unspecified: Secondary | ICD-10-CM

## 2012-06-08 NOTE — Telephone Encounter (Signed)
Pt had sleep study performed several weeks ago and has not heard anything regarding the results   Please call at [phone] (M)

## 2012-06-08 NOTE — Telephone Encounter (Signed)
PT HAD A SLEEP STUDY DONE AND STATED WE HAD CALLED HIM. PLEASE CALL [phone]

## 2012-06-08 NOTE — Telephone Encounter (Signed)
Printed scan of sleep study off [facility] but do not see a Dx listed or Impressions from Dr Maple Hudson. Called [facility] and they are faxing over the Impressions from Dr Maple Hudson.   Report does list Dx of moderate sleep apnea w/recommendation for CPAP titration study. I have put the report in Angela's box for review and will put in referral for titration study.  LMOM for pt to call back.

## 2012-06-08 NOTE — Telephone Encounter (Signed)
Advised pt of the sleep study results and need for titration study. Informed pt that I have already ordered the titration study. Pt agreed.

## 2012-06-30 ENCOUNTER — Telehealth: Payer: Self-pay

## 2012-06-30 ENCOUNTER — Other Ambulatory Visit: Payer: Self-pay | Admitting: Cardiology

## 2012-06-30 DIAGNOSIS — I5022 Chronic systolic (congestive) heart failure: Secondary | ICD-10-CM

## 2012-06-30 MED ORDER — CARVEDILOL 12.5 MG PO TABS: ORAL_TABLET | ORAL | Status: DC

## 2012-06-30 MED ORDER — SPIRONOLACTONE 25 MG PO TABS: 25.0000 mg | ORAL_TABLET | Freq: Every day | ORAL | Status: DC

## 2012-06-30 NOTE — Telephone Encounter (Signed)
Sent this in

## 2012-06-30 NOTE — Telephone Encounter (Signed)
Patient would like spironolactone (ALDACTONE) 25 MG tablet [[id number]] and carvedilol (COREG) 12.5 MG tablet [[id number]] refilled. He says he didn't need it when he came last time and now needs it sent to [facility] on [address] where he can get a 90-day supply cheaper than [facility].

## 2012-08-31 ENCOUNTER — Telehealth: Payer: Self-pay

## 2012-08-31 NOTE — Telephone Encounter (Signed)
Called pt back and had to [facility] for him to CB. Dr Patsy Lager wants [name] to check w/pt to see if he ever returned for his CPAP titration. It is still showing as a future order. Dr Patsy Lager wants [name] to

## 2012-08-31 NOTE — Telephone Encounter (Signed)
Pt states that he has a missed call and he doesn't know why someone would be calling him Call back number is [phone]

## 2012-08-31 NOTE — Telephone Encounter (Signed)
Correction to last documentation: Gary Oar, PA-C asked [name] to call pt, not Dr Patsy Lager.  Pt returned call and I spoke w/him concerning CPAP titration. He stated that he hadn't been able to get in touch w/[facility] because he lost their number. I gave pt info about where his study had been done and gave him # to [employer], and stressed importance of getting this done. Pt agreed.

## 2012-10-03 ENCOUNTER — Other Ambulatory Visit: Payer: Self-pay | Admitting: Radiology

## 2012-10-03 MED ORDER — FUROSEMIDE 40 MG PO TABS: 40.0000 mg | ORAL_TABLET | Freq: Every day | ORAL | Status: DC

## 2012-10-03 NOTE — Telephone Encounter (Signed)
Patient due for follow up , [facility] can send in one month  supply to local pharmacy until he can come in .

## 2012-12-29 ENCOUNTER — Telehealth: Payer: Self-pay

## 2012-12-29 NOTE — Telephone Encounter (Signed)
Patient would like refill of Lasix sent to [facility] on [address] this time rather than the mail order he used to get it from.

## 2013-01-01 MED ORDER — FUROSEMIDE 40 MG PO TABS: 40.0000 mg | ORAL_TABLET | Freq: Every day | ORAL | Status: DC

## 2013-01-05 ENCOUNTER — Telehealth: Payer: Self-pay

## 2013-01-05 NOTE — Telephone Encounter (Signed)
Pt recently called regarding a refill on his Lasix. He asked for it to be sent to [facility] instead of his mail order because [facility]. [facility] with a quantity of 14. He needs it for a 90 day supply.   He is asking for this to be a permanent pharmacy change, not just a temporary fill until his mail order arrives.   Please call pt.   bf

## 2013-01-05 NOTE — Telephone Encounter (Signed)
He needs office visit for 90 day supply, left message to advise.

## 2013-01-18 ENCOUNTER — Ambulatory Visit (INDEPENDENT_AMBULATORY_CARE_PROVIDER_SITE_OTHER): Payer: BC Managed Care – PPO | Admitting: Physician Assistant

## 2013-01-18 ENCOUNTER — Encounter: Payer: Self-pay | Admitting: Physician Assistant

## 2013-01-18 VITALS — BP 113/71 | HR 74 | Temp 98.2°F | Resp 16 | Ht 73.0 in | Wt 294.0 lb

## 2013-01-18 DIAGNOSIS — G4733 Obstructive sleep apnea (adult) (pediatric): Secondary | ICD-10-CM

## 2013-01-18 DIAGNOSIS — I10 Essential (primary) hypertension: Secondary | ICD-10-CM

## 2013-01-18 DIAGNOSIS — E7800 Pure hypercholesterolemia, unspecified: Secondary | ICD-10-CM

## 2013-01-18 DIAGNOSIS — I5022 Chronic systolic (congestive) heart failure: Secondary | ICD-10-CM

## 2013-01-18 MED ORDER — LISINOPRIL 10 MG PO TABS: ORAL_TABLET | ORAL | Status: DC

## 2013-01-18 MED ORDER — FUROSEMIDE 40 MG PO TABS: 40.0000 mg | ORAL_TABLET | Freq: Every day | ORAL | Status: DC

## 2013-01-18 NOTE — Patient Instructions (Signed)
If you have not heard anything regarding the referrals in 7-10, please contact our office.

## 2013-01-18 NOTE — Progress Notes (Signed)
  Subjective:    Patient ID: Gary Williamson, male    DOB: [birth year], 47 y.o.   MRN: [id number]  HPI  This 47 y.o. male presents for prescription refills.  Previously followed by Georgian Co, PA-C.  His last visit was 04/2012 for a physical.  Fasting labs were drawn at that time.  His heart failure is managed by Dr. Shirlee Latch, but chart review indicates he hasn't been there since 2012.  He denies chest pain, SOB, HA, dizziness, swelling in the legs, dyspnea on exertion, cough, wheezing, indigestion.  He had a sleep study in 05/2012, but did not go back for CPAP titration.  Patient Active Problem List   Diagnosis Date Noted  . OSA (obstructive sleep apnea) 01/18/2013  . Ulnar collateral ligament sprain 04/13/2012  . Hypertension   . CARDIOVASCULAR FUNCTION STUDY, ABNORMAL 11/04/2009  . PURE HYPERCHOLESTEROLEMIA 10/14/2009  . CHRONIC SYSTOLIC HEART FAILURE 10/14/2009   Medications, allergies, past medical history, surgical history, family history, social history and problem list reviewed.   Review of Systems No vision change, N/V, diarrhea, constipation, dysuria, urinary urgency or frequency, myalgias, arthralgias or rash.     Objective:   Physical Exam  Constitutional: He is oriented to person, place, and time. Vital signs are normal. He appears well-developed and well-nourished. He is active and cooperative. No distress.  HENT:  Head: Normocephalic and atraumatic.  Right Ear: Hearing normal.  Left Ear: Hearing normal.  Eyes: EOM are normal. Pupils are equal, round, and reactive to light.  Neck: Normal range of motion. Neck supple. No thyromegaly present.  Cardiovascular: Normal rate, regular rhythm and normal heart sounds.   Pulses:      Radial pulses are 2+ on the right side, and 2+ on the left side.  Pulmonary/Chest: Effort normal and breath sounds normal.  Musculoskeletal: He exhibits no edema.  Lymphadenopathy:       Head (right side): No tonsillar, no preauricular, no  posterior auricular and no occipital adenopathy present.       Head (left side): No tonsillar, no preauricular, no posterior auricular and no occipital adenopathy present.    He has no cervical adenopathy.       Right: No supraclavicular adenopathy present.       Left: No supraclavicular adenopathy present.  Neurological: He is alert and oriented to person, place, and time. No sensory deficit.  Skin: Skin is warm, dry and intact. No rash noted. No cyanosis or erythema. Nails show no clubbing.  Psychiatric: He has a normal mood and affect.          Assessment & Plan:  Chronic systolic heart failure - Plan: furosemide (LASIX) 40 MG tablet, lisinopril (PRINIVIL,ZESTRIL) 10 MG tablet, Ambulatory referral to Cardiology  Hypertension  Pure hypercholesterolemia  OSA (obstructive sleep apnea) - Plan: Ambulatory referral to Sleep Studies  He'll follow up with Dr. Shirlee Latch and get the CPAP titration.  Continue current medications.  RTC 3 months for Wellness exam and fasting labs, sooner if needed. He'll get the flu vaccine at work, and the pneumococcal vaccine at his next visit.  Fernande Bras, PA-C Physician Assistant-Certified [facility]

## 2013-01-23 ENCOUNTER — Ambulatory Visit: Payer: BC Managed Care – PPO | Admitting: Physician Assistant

## 2013-01-30 ENCOUNTER — Encounter: Payer: Self-pay | Admitting: Physician Assistant

## 2013-02-24 ENCOUNTER — Ambulatory Visit (INDEPENDENT_AMBULATORY_CARE_PROVIDER_SITE_OTHER): Payer: BC Managed Care – PPO | Admitting: Family Medicine

## 2013-02-24 VITALS — BP 122/70 | HR 80 | Temp 98.0°F | Resp 16 | Ht 72.5 in | Wt 296.0 lb

## 2013-02-24 DIAGNOSIS — I5022 Chronic systolic (congestive) heart failure: Secondary | ICD-10-CM

## 2013-02-24 DIAGNOSIS — J069 Acute upper respiratory infection, unspecified: Secondary | ICD-10-CM

## 2013-02-24 MED ORDER — MUCINEX DM 30-600 MG PO TB12: ORAL_TABLET | ORAL | Status: DC

## 2013-02-24 MED ORDER — AZITHROMYCIN 250 MG PO TABS: ORAL_TABLET | ORAL | Status: DC

## 2013-02-24 MED ORDER — IPRATROPIUM BROMIDE 0.03 % NA SOLN: 2.0000 | Freq: Two times a day (BID) | NASAL | Status: DC

## 2013-02-24 NOTE — Progress Notes (Signed)
[address]   [place], [place]  [address]   [phone]  Subjective:    Patient ID: Gary Williamson, male    DOB: 09-Apr-1966, 47 y.o.   MRN: [id number]  HPI HPI Comments:  Gary Williamson is a 47 y.o. male who presents to [facility] complaining of congestion. He reports associated clear rhinorrhea, sore throat, productive cough, and a fever Tmax 100 degrees.  Onset of symptoms five days ago.   He reports his symptoms present in both his head and chest. Pt states he has taken Vitamin C and Alka Selter Plus with no relief. He reports sick contacts at work. Pt denies any headache, chills, nausea, vomiting, diarrhea, SOB, sneezing, itchy or red eyes, itchy nose, or tender glands. He denies any recent issues with swelling or CP. Pt denies smoking.  No ear pain; mild ST initially but has resolved.  +rhinorrhea; no nasal congestion.  +coughing; +sputum.    Review of Systems  Constitutional: Positive for fever. Negative for chills.  HENT: Positive for congestion, rhinorrhea, sneezing and sore throat.   Eyes: Negative for redness and itching.  Respiratory: Positive for cough. Negative for shortness of breath.   Cardiovascular: Negative for chest pain and leg swelling.  Gastrointestinal: Negative for nausea, vomiting and diarrhea.   Past Medical History  Diagnosis Date  . Cardiomyopathy     Probably nonischemic; Left heart cath in 2002/03 no sig CAD per report (done in [facility]). Cardiomyopathy for 9-10 yrs. 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 Echo 5/11: severe LV dilation with EF <20%, diffuse hypokinesis, mod dias dys, sev. LA enlargement. Hosp for CHF in 5/11. Adenosine myoiew 6/11: EF 21%, mild per-infart ischemia. LHC done 7/11 show EF 15%,  . Hypertension   . LBBB (left bundle branch block)     Chronic  . Hyperlipidemia   . CHF (congestive heart failure)    No Known Allergies History   Social History  . Marital Status: Married    Spouse Name: Felicia    Number  of Children: 2  . Years of Education: N/A   Occupational History  . Interior and spatial designer of Presenter, broadcasting for [employer]  .     Social History Main Topics  . Smoking status: Never Smoker   . Smokeless tobacco: Never Used  . Alcohol Use: 0 - 1 oz/week    0-2 drink(s) per week  . Drug Use: No  . Sexual Activity: Not on file   Other Topics Concern  . Not on file   Social History Narrative   Lives in [place] with his wife and 2 children   Regular diet   No regular exercise   Current Outpatient Prescriptions on File Prior to Visit  Medication Sig Dispense Refill  . aspirin 81 MG EC tablet Take 1 tablet (81 mg total) by mouth daily.  30 tablet  2  . carvedilol (COREG) 12.5 MG tablet Take 1 and 1/2 tablets twice a day.  270 tablet  3  . fish oil-omega-3 fatty acids 1000 MG capsule Take 1 capsule (1 g total) by mouth daily.  30 capsule  2  . furosemide (LASIX) 40 MG tablet Take 1 tablet (40 mg total) by mouth daily.  90 tablet  1  . lisinopril (PRINIVIL,ZESTRIL) 10 MG tablet Take 1 and 1/2 tablets twice a day  270 tablet  1  . Multiple Vitamin (MULTIVITAMIN) tablet Take 1 tablet by mouth daily.        Marland Kitchen  potassium chloride SA (K-DUR,KLOR-CON) 20 MEQ tablet Take 1 tablet (20 mEq total) by mouth daily.  90 tablet  1  . simvastatin (ZOCOR) 40 MG tablet Take 1 tablet (40 mg total) by mouth every evening.  90 tablet  1  . spironolactone (ALDACTONE) 25 MG tablet Take 1 tablet (25 mg total) by mouth daily.  90 tablet  3   No current facility-administered medications on file prior to visit.       Objective:   Physical Exam  Nursing note and vitals reviewed. Constitutional: He is oriented to person, place, and time. He appears well-developed and well-nourished. No distress.  Obese  HENT:  Head: Normocephalic and atraumatic.  Right Ear: External ear normal.  Left Ear: External ear normal.  Nose: Nose normal.  Mouth/Throat: No oropharyngeal exudate.  Oropharynx drainage.   Eyes: Conjunctivae are normal. No scleral icterus.  Neck: Neck supple.  Cardiovascular: Normal rate, regular rhythm, normal heart sounds and intact distal pulses.   No murmur heard. Pulmonary/Chest: Effort normal and breath sounds normal. No stridor. No respiratory distress.  Abdominal: Normal appearance. He exhibits no distension.  Lymphadenopathy:    He has no cervical adenopathy.  Neurological: He is alert and oriented to person, place, and time.  Skin: Skin is warm and dry. No rash noted.  Psychiatric: He has a normal mood and affect. His behavior is normal.    Triage Vitals:  BP 122/70  Pulse 80  Temp(Src) 98 F (36.7 C) (Oral)  Resp 16  Ht 6' 0.5" (1.842 m)  Wt 296 lb (134.265 kg)  BMI 39.57 kg/m2  SpO2 99%     Assessment & Plan:  Acute upper respiratory infections of unspecified site  Chronic systolic heart failure   1.  URI: New. Onset five days ago with worsening in past 24 hours; will treat with Zithromax, Mucinex DM, Atrovent nasal spray due to histoyr of CHF systolic. 2. CHF systolic: stable; euvolemic in office today; no volume overload symptoms or findings on physical exam.  COORDINATION OF CARE: Will prescribe a Z-pak, Mucinex DM, and a steroid nasal spray. Advised pt to return with new or worsening symptoms.  Meds ordered this encounter  Medications  . azithromycin (ZITHROMAX) 250 MG tablet    Sig: Take 2 tabs PO x 1 dose, then 1 tab PO QD x 4 days    Dispense:  6 tablet    Refill:  0  . Dextromethorphan-Guaifenesin (MUCINEX DM) 30-600 MG TB12    Sig: One tablet twice daily PRN congestion, cough.    Dispense:  28 each    Refill:  0  . ipratropium (ATROVENT) 0.03 % nasal spray    Sig: Place 2 sprays into the nose 2 (two) times daily.    Dispense:  30 mL    Refill:  0   I personally performed the services described in this documentation, which was scribed in my presence.  The recorded information has been reviewed and is accurate.

## 2013-02-24 NOTE — Patient Instructions (Signed)

## 2013-03-08 ENCOUNTER — Other Ambulatory Visit: Payer: Self-pay

## 2013-03-14 ENCOUNTER — Other Ambulatory Visit: Payer: Self-pay

## 2013-03-14 MED ORDER — SIMVASTATIN 40 MG PO TABS: 40.0000 mg | ORAL_TABLET | Freq: Every evening | ORAL | Status: DC

## 2013-03-14 MED ORDER — POTASSIUM CHLORIDE CRYS ER 20 MEQ PO TBCR: 20.0000 meq | EXTENDED_RELEASE_TABLET | Freq: Every day | ORAL | Status: DC

## 2013-04-03 ENCOUNTER — Ambulatory Visit (INDEPENDENT_AMBULATORY_CARE_PROVIDER_SITE_OTHER): Payer: BC Managed Care – PPO | Admitting: Physician Assistant

## 2013-04-03 ENCOUNTER — Encounter: Payer: Self-pay | Admitting: Physician Assistant

## 2013-04-03 VITALS — BP 120/67 | HR 75 | Temp 97.6°F | Resp 16 | Ht 72.0 in | Wt 303.0 lb

## 2013-04-03 DIAGNOSIS — G4733 Obstructive sleep apnea (adult) (pediatric): Secondary | ICD-10-CM

## 2013-04-03 DIAGNOSIS — Z0000 Encounter for general adult medical examination without abnormal findings: Secondary | ICD-10-CM

## 2013-04-03 DIAGNOSIS — R319 Hematuria, unspecified: Secondary | ICD-10-CM

## 2013-04-03 DIAGNOSIS — I10 Essential (primary) hypertension: Secondary | ICD-10-CM

## 2013-04-03 DIAGNOSIS — Z125 Encounter for screening for malignant neoplasm of prostate: Secondary | ICD-10-CM

## 2013-04-03 DIAGNOSIS — Z1211 Encounter for screening for malignant neoplasm of colon: Secondary | ICD-10-CM

## 2013-04-03 DIAGNOSIS — R739 Hyperglycemia, unspecified: Secondary | ICD-10-CM

## 2013-04-03 DIAGNOSIS — Z23 Encounter for immunization: Secondary | ICD-10-CM

## 2013-04-03 DIAGNOSIS — E7800 Pure hypercholesterolemia, unspecified: Secondary | ICD-10-CM

## 2013-04-03 DIAGNOSIS — I5022 Chronic systolic (congestive) heart failure: Secondary | ICD-10-CM

## 2013-04-03 LAB — COMPREHENSIVE METABOLIC PANEL
ALT: 28 U/L (ref 0–53)
AST: 22 U/L (ref 0–37)
Albumin: 4 g/dL (ref 3.5–5.2)
Alkaline Phosphatase: 64 U/L (ref 39–117)
Calcium: 9.2 mg/dL (ref 8.4–10.5)
Chloride: 107 mEq/L (ref 96–112)
Potassium: 4.4 mEq/L (ref 3.5–5.3)
Sodium: 140 mEq/L (ref 135–145)
Total Protein: 6.8 g/dL (ref 6.0–8.3)

## 2013-04-03 LAB — POCT UA - MICROSCOPIC ONLY
Casts, Ur, LPF, POC: NEGATIVE
Crystals, Ur, HPF, POC: NEGATIVE
Mucus, UA: POSITIVE
Sperm: POSITIVE

## 2013-04-03 LAB — CBC WITH DIFFERENTIAL/PLATELET
Basophils Absolute: 0 10*3/uL (ref 0.0–0.1)
Eosinophils Relative: 3 % (ref 0–5)
HCT: 37.8 % — ABNORMAL LOW (ref 39.0–52.0)
Hemoglobin: 13 g/dL (ref 13.0–17.0)
Lymphocytes Relative: 23 % (ref 12–46)
Lymphs Abs: 1.6 10*3/uL (ref 0.7–4.0)
MCH: 29 pg (ref 26.0–34.0)
MCV: 84.4 fL (ref 78.0–100.0)
Monocytes Absolute: 0.7 10*3/uL (ref 0.1–1.0)
Monocytes Relative: 10 % (ref 3–12)
Neutro Abs: 4.4 10*3/uL (ref 1.7–7.7)
Platelets: 217 10*3/uL (ref 150–400)
RBC: 4.48 MIL/uL (ref 4.22–5.81)
WBC: 6.9 10*3/uL (ref 4.0–10.5)

## 2013-04-03 LAB — POCT URINALYSIS DIPSTICK
Bilirubin, UA: NEGATIVE
Blood, UA: NEGATIVE
Glucose, UA: NEGATIVE
Nitrite, UA: NEGATIVE
Protein, UA: 100
Spec Grav, UA: >=1.03
Urobilinogen, UA: 0.2
pH, UA: 5.5

## 2013-04-03 LAB — LIPID PANEL
Cholesterol: 150 mg/dL (ref 0–200)
HDL: 32 mg/dL — ABNORMAL LOW (ref >39)
Total CHOL/HDL Ratio: 4.7 Ratio
Triglycerides: 154 mg/dL — ABNORMAL HIGH (ref <150)
VLDL: 31 mg/dL (ref 0–40)

## 2013-04-03 LAB — PSA: PSA: 0.53 ng/mL (ref <=4.00)

## 2013-04-03 NOTE — Progress Notes (Signed)
Subjective:    Patient ID: Gary Williamson, male    DOB: [birth year], 47 y.o.   MRN: [id number]  HPI  This 47 y.o. male presents for Annual Wellness Exam.  He wasn't able to attend the cardiology and CPAP titration appointments after his visit with me in 01/2013.  The cardiology visit has been rescheduled for the day after tomorrow, and he plans to reschedule the CPAP titration, has just been putting it off.  Overall, he feels well.  He's tolerating his medications.  Has no specific complaints today, is only here because I scheduled the visit.  Active Ambulatory Problems    Diagnosis Date Noted  . PURE HYPERCHOLESTEROLEMIA 10/14/2009  . CHRONIC SYSTOLIC HEART FAILURE 10/14/2009  . CARDIOVASCULAR FUNCTION STUDY, ABNORMAL 11/04/2009  . Hypertension   . Ulnar collateral ligament sprain 04/13/2012  . OSA (obstructive sleep apnea) 01/18/2013  . Severe obesity (BMI >= 40) 04/03/2013   Resolved Ambulatory Problems    Diagnosis Date Noted  . No Resolved Ambulatory Problems   Past Medical History  Diagnosis Date  . Cardiomyopathy   . LBBB (left bundle branch block)   . Hyperlipidemia   . CHF (congestive heart failure)     Past Surgical History  Procedure Laterality Date  . Cardiac catheterization  2002 or 2003    No Known Allergies  Prior to Admission medications   Medication Sig Start Date End Date Taking? Authorizing Provider  aspirin 81 MG EC tablet Take 1 tablet (81 mg total) by mouth daily. 12/22/10  Yes Laurey Morale, MD  carvedilol (COREG) 12.5 MG tablet Take 1 and 1/2 tablets twice a day. 06/30/12  Yes Anders Simmonds, PA-C  fish oil-omega-3 fatty acids 1000 MG capsule Take 1 capsule (1 g total) by mouth daily. 12/22/10  Yes Laurey Morale, MD  furosemide (LASIX) 40 MG tablet Take 1 tablet (40 mg total) by mouth daily. 01/18/13  Yes Xavia Kniskern S Billyjack Trompeter, PA-C  lisinopril (PRINIVIL,ZESTRIL) 10 MG tablet Take 1 and 1/2 tablets twice a day 01/18/13  Yes Cylan Borum S Naila Elizondo, PA-C    Multiple Vitamin (MULTIVITAMIN) tablet Take 1 tablet by mouth daily.     Yes Historical Provider, MD  potassium chloride SA (K-DUR,KLOR-CON) 20 MEQ tablet Take 1 tablet (20 mEq total) by mouth daily. 03/14/13  Yes Keshan Reha S Alexys Lobello, PA-C  simvastatin (ZOCOR) 40 MG tablet Take 1 tablet (40 mg total) by mouth every evening. 03/14/13 03/14/14 Yes Nataline Basara S Darion Juhasz, PA-C  spironolactone (ALDACTONE) 25 MG tablet Take 1 tablet (25 mg total) by mouth daily. 06/30/12  Yes Anders Simmonds, PA-C    History   Social History  . Marital Status: Married    Spouse Name: Felicia    Number of Children: 2  . Years of Education: N/A   Occupational History  . Interior and spatial designer of Presenter, broadcasting for [employer]  .     Social History Main Topics  . Smoking status: Never Smoker   . Smokeless tobacco: Never Used  . Alcohol Use: 0 - 1 oz/week    0-2 drink(s) per week  . Drug Use: No  . Sexual Activity: None   Other Topics Concern  . None   Social History Narrative   Lives in [place] with his wife and 2 children   Regular diet   No regular exercise    family history includes Alzheimer's disease in his mother; Arthritis in an other family member; Hyperlipidemia in his mother; Hypertension in his father  and mother; Stroke in his maternal grandmother. There is no history of Coronary artery disease, Heart failure, or Heart disease. indicated that his mother is alive. He indicated that his father is deceased. He indicated that both of his sisters are alive. He indicated that his brother is deceased. He indicated that his maternal grandmother is deceased. He indicated that his maternal grandfather is deceased. He indicated that his paternal grandmother is deceased. He indicated that his paternal grandfather is deceased. He indicated that both of his sons are alive.    Review of Systems  Constitutional: Negative.   HENT: Negative.   Eyes: Negative.   Respiratory: Negative.   Cardiovascular:  Negative.   Gastrointestinal: Negative.   Genitourinary: Negative.   Musculoskeletal: Positive for arthralgias (RIGHT knee pain and stiffness when rises from sitting.  Resolves after walking for a few minutes.  Does not interfere with his desired activities). Negative for back pain, gait problem, joint swelling, myalgias, neck pain and neck stiffness.  Skin: Negative.   Neurological: Negative.   Psychiatric/Behavioral: Negative.        Objective:   Physical Exam  Vitals reviewed. Constitutional: He is oriented to person, place, and time. Vital signs are normal. He appears well-developed and well-nourished. He is active and cooperative.  Non-toxic appearance. He does not have a sickly appearance. He does not appear ill. No distress.  HENT:  Head: Normocephalic and atraumatic.  Right Ear: Hearing, tympanic membrane, external ear and ear canal normal.  Left Ear: Hearing, tympanic membrane, external ear and ear canal normal.  Nose: Nose normal.  Mouth/Throat: Uvula is midline, oropharynx is clear and moist and mucous membranes are normal. He does not have dentures. No oral lesions. No trismus in the jaw. Normal dentition. No dental abscesses, uvula swelling, lacerations or dental caries.  Eyes: Conjunctivae, EOM and lids are normal. Pupils are equal, round, and reactive to light. Right eye exhibits no discharge. Left eye exhibits no discharge. No scleral icterus.  Fundoscopic exam:      The right eye shows no arteriolar narrowing, no AV nicking, no exudate, no hemorrhage and no papilledema.       The left eye shows no arteriolar narrowing, no AV nicking, no exudate, no hemorrhage and no papilledema.  Neck: Normal range of motion, full passive range of motion without pain and phonation normal. Neck supple. No spinous process tenderness and no muscular tenderness present. No rigidity. No tracheal deviation, no edema, no erythema and normal range of motion present. No thyromegaly present.    Cardiovascular: Normal rate, regular rhythm, S1 normal, S2 normal, normal heart sounds, intact distal pulses and normal pulses.  Exam reveals no gallop and no friction rub.   No murmur heard. Pulmonary/Chest: Effort normal and breath sounds normal. No respiratory distress. He has no wheezes. He has no rales.  Abdominal: Soft. Normal appearance and bowel sounds are normal. He exhibits no distension and no mass. There is no hepatosplenomegaly. There is no tenderness. There is no rebound and no guarding. No hernia. Hernia confirmed negative in the right inguinal area and confirmed negative in the left inguinal area.  Genitourinary: Rectum normal, prostate normal, testes normal and penis normal. Guaiac negative stool. Circumcised. No phimosis, paraphimosis, hypospadias, penile erythema or penile tenderness. No discharge found.  Musculoskeletal: Normal range of motion. He exhibits no edema and no tenderness.       Right shoulder: Normal.       Left shoulder: Normal.       Right elbow:  Normal.      Left elbow: Normal.       Right wrist: Normal.       Left wrist: Normal.       Right hip: Normal.       Left hip: Normal.       Right knee: Normal.       Left knee: Normal.       Right ankle: Normal. Achilles tendon normal.       Left ankle: Normal. Achilles tendon normal.       Cervical back: Normal. He exhibits normal range of motion, no tenderness, no bony tenderness, no swelling, no edema, no deformity, no laceration, no pain, no spasm and normal pulse.       Thoracic back: Normal.       Lumbar back: Normal.       Right upper arm: Normal.       Left upper arm: Normal.       Right forearm: Normal.       Left forearm: Normal.       Right hand: Normal.       Left hand: Normal.       Right upper leg: Normal.       Left upper leg: Normal.       Right lower leg: Normal.       Left lower leg: Normal.       Right foot: Normal.       Left foot: Normal.  Lymphadenopathy:       Head (right side): No  submental, no submandibular, no tonsillar, no preauricular, no posterior auricular and no occipital adenopathy present.       Head (left side): No submental, no submandibular, no tonsillar, no preauricular, no posterior auricular and no occipital adenopathy present.    He has no cervical adenopathy.       Right: No inguinal and no supraclavicular adenopathy present.       Left: No inguinal and no supraclavicular adenopathy present.  Neurological: He is alert and oriented to person, place, and time. He has normal strength and normal reflexes. He displays no tremor. No cranial nerve deficit. He exhibits normal muscle tone. Coordination and gait normal.  Skin: Skin is warm, dry and intact. No abrasion, no ecchymosis, no laceration, no lesion and no rash noted. He is not diaphoretic. No cyanosis or erythema. No pallor. Nails show no clubbing.  Psychiatric: He has a normal mood and affect. His speech is normal and behavior is normal. Judgment and thought content normal. Cognition and memory are normal.   BP 120/67  Pulse 75  Temp(Src) 97.6 F (36.4 C)  Resp 16  Ht 6' (1.829 m)  Wt 303 lb (137.44 kg)  BMI 41.09 kg/m2   Results for orders placed in visit on 04/03/13  GLUCOSE, POCT (MANUAL RESULT ENTRY)      Result Value Range   POC Glucose 114 (*) 70 - 99 mg/dl  IFOBT (OCCULT BLOOD)      Result Value Range   IFOBT Negative    POCT UA - MICROSCOPIC ONLY      Result Value Range   WBC, Ur, HPF, POC 2-3     RBC, urine, microscopic 4-6     Bacteria, U Microscopic 1+     Mucus, UA pos     Epithelial cells, urine per micros 0-1     Crystals, Ur, HPF, POC neg     Casts, Ur, LPF, POC neg     Yeast,  UA neg     Sperm pos    POCT URINALYSIS DIPSTICK      Result Value Range   Color, UA yellow     Clarity, UA clear     Glucose, UA neg     Bilirubin, UA neg     Ketones, UA trace     Spec Grav, UA >=1.030     Blood, UA neg     pH, UA 5.5     Protein, UA 100     Urobilinogen, UA 0.2      Nitrite, UA neg     Leukocytes, UA Negative         Assessment & Plan:  Routine general medical examination at a health care facility - Plan: CBC with Differential; Age appropriate anticipatory guidance provided.  Chronic systolic heart failure - follow up with Cardiology 04/05/2013 as planned  OSA (obstructive sleep apnea) -strongly encouraged him to re-schedule CPAP titration.  Hypertension - Plan: Comprehensive metabolic panel, POCT UA - Microscopic Only, POCT urinalysis dipstick, TSH, CBC with Differential  Pure hypercholesterolemia - Plan: Lipid panel  Need for pneumococcal vaccine - Plan: Pneumococcal polysaccharide vaccine 23-valent greater than or equal to 2yo subcutaneous/IM  Severe obesity (BMI >= 40) - Plan: POCT glucose (manual entry)  Screening for colon cancer - Plan: IFOBT POC (occult bld, rslt in office)  Screening for prostate cancer - Plan: PSA  Hyperglycemia - Plan: POCT glycosylated hemoglobin (Hb A1C)  Hematuria, microscopic - Plan: Ambulatory referral to Urology  Continue current medications.  RTC 4 months, sooner, PRN.  Fernande Bras, PA-C Physician Assistant-Certified [facility]

## 2013-04-03 NOTE — Patient Instructions (Signed)
I will contact you with your lab results as soon as they are available.   If you have not heard from me in 2 weeks, please contact me.  The fastest way to get your results is to register for My Chart (see the instructions on the last page of this printout).  Please reschedule the CPAP Titration visit as soon as you are able.  Treating the sleep apnea will improve your overall health.  Keeping you healthy  Get these tests  Blood pressure- Have your blood pressure checked once a year by your healthcare provider.  Normal blood pressure is 120/80.  Weight- Have your body mass index (BMI) calculated to screen for obesity.  BMI is a measure of body fat based on height and weight. You can also calculate your own BMI at https://www.west-esparza.com/.  Cholesterol- Have your cholesterol checked regularly starting at age 75, sooner may be necessary if you have diabetes, high blood pressure, if a family member developed heart diseases at an early age or if you smoke.   Chlamydia, HIV, and other sexual transmitted disease- Get screened each year until the age of 48 then within three months of each new sexual partner.  Diabetes- Have your blood sugar checked regularly if you have high blood pressure, high cholesterol, a family history of diabetes or if you are overweight.  Get these vaccines  Flu shot- Every fall.  Tetanus shot- Every 10 years.  Menactra- Single dose; prevents meningitis.  Take these steps  Don't smoke- If you do smoke, ask your healthcare provider about quitting. For tips on how to quit, go to www.smokefree.gov or call 1-800-QUIT-NOW.  Be physically active- Exercise 5 days a week for at least 30 minutes.  If you are not already physically active start slow and gradually work up to 30 minutes of moderate physical activity.  Examples of moderate activity include walking briskly, mowing the yard, dancing, swimming bicycling, etc.  Eat a healthy diet- Eat a variety of healthy foods  such as fruits, vegetables, low fat milk, low fat cheese, yogurt, lean meats, poultry, fish, beans, tofu, etc.  For more information on healthy eating, go to www.thenutritionsource.org  Drink alcohol in moderation- Limit alcohol intake two drinks or less a day.  Never drink and drive.  Dentist- Brush and floss teeth twice daily; visit your dentis twice a year.  Depression-Your emotional health is as important as your physical health.  If you're feeling down, losing interest in things you normally enjoy please talk with your healthcare provider.  Gun Safety- If you keep a gun in your home, keep it unloaded and with the safety lock on.  Bullets should be stored separately.  Helmet use- Always wear a helmet when riding a motorcycle, bicycle, rollerblading or skateboarding.  Safe sex- If you may be exposed to a sexually transmitted infection, use a condom  Seat belts- Seat bels can save your life; always wear one.  Smoke/Carbon Monoxide detectors- These detectors need to be installed on the appropriate level of your home.  Replace batteries at least once a year.  Skin Cancer- When out in the sun, cover up and use sunscreen SPF 15 or higher.  Violence- If anyone is threatening or hurting you, please tell your healthcare provider.

## 2013-04-04 NOTE — Progress Notes (Signed)
This 47 y.o. male presents for evaluation of sore throat and cough. [id number].  Chief Complaint  Patient presents with  . Annual Exam   Medications, allergies, past medical history, surgical history, family history, social history and problem list reviewed and updated.    Active Ambulatory Problems    Diagnosis Date Noted  . PURE HYPERCHOLESTEROLEMIA 10/14/2009  . CHRONIC SYSTOLIC HEART FAILURE 10/14/2009  . CARDIOVASCULAR FUNCTION STUDY, ABNORMAL 11/04/2009  . Hypertension   . Ulnar collateral ligament sprain 04/13/2012  . OSA (obstructive sleep apnea) 01/18/2013  . Severe obesity (BMI >= 40) 04/03/2013   Resolved Ambulatory Problems    Diagnosis Date Noted  . No Resolved Ambulatory Problems   Past Medical History  Diagnosis Date  . Cardiomyopathy   . LBBB (left bundle branch block)   . Hyperlipidemia   . CHF (congestive heart failure)     Past Surgical History  Procedure Laterality Date  . Cardiac catheterization  2002 or 2003    No Known Allergies  Prior to Admission medications   Medication Sig Start Date End Date Taking? Authorizing Provider  aspirin 81 MG EC tablet Take 1 tablet (81 mg total) by mouth daily. 12/22/10  Yes Laurey Morale, MD  carvedilol (COREG) 12.5 MG tablet Take 1 and 1/2 tablets twice a day. 06/30/12  Yes Anders Simmonds, PA-C  fish oil-omega-3 fatty acids 1000 MG capsule Take 1 capsule (1 g total) by mouth daily. 12/22/10  Yes Laurey Morale, MD  furosemide (LASIX) 40 MG tablet Take 1 tablet (40 mg total) by mouth daily. 01/18/13  Yes Jakyrie Totherow S Alysen Smylie, PA-C  lisinopril (PRINIVIL,ZESTRIL) 10 MG tablet Take 1 and 1/2 tablets twice a day 01/18/13  Yes Ashyr Hedgepath S Percy Comp, PA-C  Multiple Vitamin (MULTIVITAMIN) tablet Take 1 tablet by mouth daily.     Yes Historical Provider, MD  potassium chloride SA (K-DUR,KLOR-CON) 20 MEQ tablet Take 1 tablet (20 mEq total) by mouth daily. 03/14/13  Yes Mackenze Grandison S Reola Buckles, PA-C  simvastatin  (ZOCOR) 40 MG tablet Take 1 tablet (40 mg total) by mouth every evening. 03/14/13 03/14/14 Yes Debera Sterba S Brayli Klingbeil, PA-C  spironolactone (ALDACTONE) 25 MG tablet Take 1 tablet (25 mg total) by mouth daily. 06/30/12  Yes Anders Simmonds, PA-C    History   Social History  . Marital Status: Married    Spouse Name: Felicia    Number of Children: 2  . Years of Education: N/A   Occupational History  . Interior and spatial designer of Presenter, broadcasting for [employer]  .     Social History Main Topics  . Smoking status: Never Smoker   . Smokeless tobacco: Never Used  . Alcohol Use: 0 - 1 oz/week    0-2 drink(s) per week  . Drug Use: No  . Sexual Activity: None   Other Topics Concern  . None   Social History Narrative   Lives in [place] with his wife and 2 children   Regular diet   No regular exercise    family history includes Alzheimer's disease in his mother; Arthritis in an other family member; Hyperlipidemia in his mother; Hypertension in his father and mother; Stroke in his maternal grandmother. There is no history of Coronary artery disease, Heart failure, or Heart disease. indicated that his mother is alive. He indicated that his father is deceased. He indicated that both of his sisters are alive. He indicated that his brother is deceased. He indicated that his maternal grandmother is deceased. He  indicated that his maternal grandfather is deceased. He indicated that his paternal grandmother is deceased. He indicated that his paternal grandfather is deceased. He indicated that both of his sons are alive.

## 2013-04-06 ENCOUNTER — Encounter (HOSPITAL_BASED_OUTPATIENT_CLINIC_OR_DEPARTMENT_OTHER): Payer: BC Managed Care – PPO

## 2013-04-09 ENCOUNTER — Ambulatory Visit (INDEPENDENT_AMBULATORY_CARE_PROVIDER_SITE_OTHER): Payer: BC Managed Care – PPO | Admitting: Physician Assistant

## 2013-04-09 ENCOUNTER — Encounter: Payer: Self-pay | Admitting: Physician Assistant

## 2013-04-09 VITALS — BP 110/78 | HR 76 | Ht 73.0 in | Wt 300.0 lb

## 2013-04-09 DIAGNOSIS — E7800 Pure hypercholesterolemia, unspecified: Secondary | ICD-10-CM

## 2013-04-09 DIAGNOSIS — I5022 Chronic systolic (congestive) heart failure: Secondary | ICD-10-CM

## 2013-04-09 DIAGNOSIS — I428 Other cardiomyopathies: Secondary | ICD-10-CM

## 2013-04-09 DIAGNOSIS — I10 Essential (primary) hypertension: Secondary | ICD-10-CM

## 2013-04-09 NOTE — Progress Notes (Addendum)
[address] [place], [place]  [address] Phone: [phone] Fax:  [phone]  Date:  04/09/2013   ID:  Gary Williamson, DOB Sep 14, 1965, MRN [id number]  PCP:  JEFFERY,CHELLE, PA-C  Cardiologist:  Dr. Marca Ancona     History of Present Illness: Gary Williamson is a 47 y.o. 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  Last seen by Dr. Marca Ancona 04/2011.  Recommendation was to refer to EP for possible CRT-D implantation. Patient did not keep this appointment. Echo (04/25/13): Mod LVH, EF 20%, diffuse HK, grade 1 DD, mild LAE.  He is overall doing well.  He has gradually gained 50 lbs over the last 2 years.  He denies chest pain. He has DOE with more extreme activities.  He is NYHA Class 2.  Denies orthopnea, PND, edema.  No syncope.  He has been dx with OSA.   Recent Labs: 04/03/2013: ALT 28; Creatinine 1.20; HDL 32*; Hemoglobin 13.0; LDL (calc) 87; Potassium 4.4; TSH 2.605   Wt Readings from Last 3 Encounters:  04/09/13 300 lb (136.079 kg)  04/03/13 303 lb (137.44 kg)  02/24/13 296 lb (134.265 kg)     Past Medical History  Diagnosis Date  . NICM (nonischemic cardiomyopathy)     Probably nonischemic; LHC in 2002/03 no sig CAD per report (done in [place]). Cardiomyopathy for 9-10 yrs. 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 Echo 5/11: severe LVE, EF <20%, diff HK, mod dias dys, sev. LAE. Hosp for CHF in 5/11. Ad. MV 6/11: EF 21%, mild per-infart ischemia. LHC (7/11): EF 15%, no CAD, LVEDP 22; Echo (10/11): EF 20-25%;  Echo (12/12):  EF 20%, mod LVH, Gr 1 DD, diff HK, mild LAE  . Hypertension   . LBBB (left bundle branch block)     Chronic  . Hyperlipidemia   . Chronic systolic heart failure     Current Outpatient Prescriptions  Medication Sig Dispense Refill  . aspirin 81 MG EC tablet Take 1 tablet (81 mg total) by mouth daily.  30 tablet  2  . carvedilol (COREG) 12.5 MG tablet Take 1 and 1/2 tablets twice a day.  270 tablet  3  .  fish oil-omega-3 fatty acids 1000 MG capsule Take 1 capsule (1 g total) by mouth daily.  30 capsule  2  . furosemide (LASIX) 40 MG tablet Take 1 tablet (40 mg total) by mouth daily.  90 tablet  1  . lisinopril (PRINIVIL,ZESTRIL) 10 MG tablet Take 1 and 1/2 tablets twice a day  270 tablet  1  . Multiple Vitamin (MULTIVITAMIN) tablet Take 1 tablet by mouth daily.        . potassium chloride SA (K-DUR,KLOR-CON) 20 MEQ tablet Take 1 tablet (20 mEq total) by mouth daily.  90 tablet  2  . simvastatin (ZOCOR) 40 MG tablet Take 1 tablet (40 mg total) by mouth every evening.  90 tablet  0  . spironolactone (ALDACTONE) 25 MG tablet Take 1 tablet (25 mg total) by mouth daily.  90 tablet  3   No current facility-administered medications for this visit.    Allergies:   Review of patient's allergies indicates no known allergies.   Social History:  The patient  reports that he has never smoked. He has never used smokeless tobacco. He reports that he drinks alcohol. He reports that he does not use illicit drugs.   Family History:  The patient's family history includes  Alzheimer's disease in his mother; Arthritis in an other family member; Hyperlipidemia in his mother; Hypertension in his father and mother; Stroke in his maternal grandmother. There is no history of Coronary artery disease, Heart failure, or Heart disease.   ROS:  Please see the history of present illness.      All other systems reviewed and negative.   PHYSICAL EXAM: VS:  BP 110/78  Pulse 76  Ht 6\' 1"  (1.854 m)  Wt 300 lb (136.079 kg)  BMI 39.59 kg/m2 Well nourished, well developed, in no acute distress HEENT: normal Neck: no JVD Vascular:  No carotid bruits Cardiac:  normal S1, S2; RRR; no murmur Lungs:  clear to auscultation bilaterally, no wheezing, rhonchi or rales Abd: soft, nontender, no hepatomegaly Ext: trace bilateral LE edema Skin: warm and dry Neuro:  CNs 2-12 intact, no focal abnormalities noted  EKG:  NSR, HR 76, LBBB      ASSESSMENT AND PLAN:  1. Chronic Systolic CHF:  Volume stable.  Recent BMET stable.  Continue current Rx. 2. Non-Ischemic CM:  I do not think his BP would tolerate any further increase in his medications.  Continue current dose of beta blocker, ACEI, spironolactone.  3. Hypertension:  Controlled.  4. Hyperlipidemia:  Managed by primary care.  5. Disposition:  Refer to Dr. Johney Frame for EP.  We discussed the importance of keeping this appt.  F/u with  Dr. Marca Ancona in 4 mos.    Signed, Tereso Newcomer, PA-C  04/09/2013 9:00 AM

## 2013-04-09 NOTE — Patient Instructions (Signed)
You have been referred to Dr Johney Frame for possible ICD or CRT-D / NICM  Your physician recommends that you schedule a follow-up appointment in: 4 months with Dr Shirlee Latch

## 2013-04-20 ENCOUNTER — Ambulatory Visit (INDEPENDENT_AMBULATORY_CARE_PROVIDER_SITE_OTHER): Payer: BC Managed Care – PPO | Admitting: Internal Medicine

## 2013-04-20 VITALS — BP 131/80 | HR 79 | Ht 73.0 in | Wt 299.0 lb

## 2013-04-20 DIAGNOSIS — I5022 Chronic systolic (congestive) heart failure: Secondary | ICD-10-CM

## 2013-04-20 DIAGNOSIS — I428 Other cardiomyopathies: Secondary | ICD-10-CM

## 2013-04-20 NOTE — Patient Instructions (Signed)
Your physician recommends that you schedule a follow-up appointment in: 6-8 weeks with Dr Shirlee Latch   Your physician has requested that you have an echocardiogram. Echocardiography is a painless test that uses sound waves to create images of your heart. It provides your doctor with information about the size and shape of your heart and how well your heart's chambers and valves are working. This procedure takes approximately one hour. There are no restrictions for this procedure.

## 2013-04-24 NOTE — Progress Notes (Signed)
Primary Care Physician: Olene Floss Referring Physician: Marca Ancona, MD   Gary Williamson is a 47 y.o. male with a h/o nonischemic cardiomyopathy, hypertension, and left bundle branch block.  He has been lost to follow up since December of 2012 until he was seen by Tereso Newcomer, PA earlier this month to re-establish.  Last echo in December of 2012 demonstrated an EF of 20% with diffuse hypokinesis, grade 1 diastolic dysfunction.  He says that he has been taking his medicines but has not been as diligent about office follow-up.  He remains active.  He feels that his exercise tolerance is preserved.  He has some SOB with moderate activity but is otherwise minimally limited.  Today, he denies symptoms of palpitations, chest pain, shortness of breath at rest, orthopnea, PND, lower extremity edema, dizziness, presyncope, syncope, or neurologic sequela. The patient is tolerating medications without difficulties and is otherwise without complaint today.   Past Medical History  Diagnosis Date  . NICM (nonischemic cardiomyopathy)     Probably nonischemic; LHC in 2002/03 no sig CAD per report (done in [place]). Cardiomyopathy for 9-10 yrs. 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 Echo 5/11: severe LVE, EF <20%, diff HK, mod dias dys, sev. LAE. Hosp for CHF in 5/11. Ad. MV 6/11: EF 21%, mild per-infart ischemia. LHC (7/11): EF 15%, no CAD, LVEDP 22; Echo (10/11): EF 20-25%;  Echo (12/12):  EF 20%, mod LVH, Gr 1 DD, diff HK, mild LAE  . Hypertension   . LBBB (left bundle branch block)     Chronic  . Hyperlipidemia   . Chronic systolic heart failure    Past Surgical History  Procedure Laterality Date  . Cardiac catheterization  2002 or 2003    Current Outpatient Prescriptions  Medication Sig Dispense Refill  . aspirin 81 MG EC tablet Take 1 tablet (81 mg total) by mouth daily.  30 tablet  2  . carvedilol (COREG) 12.5 MG tablet Take 1 and 1/2 tablets twice a day.  270 tablet  3  . fish  oil-omega-3 fatty acids 1000 MG capsule Take 1 capsule (1 g total) by mouth daily.  30 capsule  2  . furosemide (LASIX) 40 MG tablet Take 1 tablet (40 mg total) by mouth daily.  90 tablet  1  . lisinopril (PRINIVIL,ZESTRIL) 10 MG tablet Take 1 and 1/2 tablets twice a day  270 tablet  1  . Multiple Vitamin (MULTIVITAMIN) tablet Take 1 tablet by mouth daily.        . potassium chloride SA (K-DUR,KLOR-CON) 20 MEQ tablet Take 1 tablet (20 mEq total) by mouth daily.  90 tablet  2  . simvastatin (ZOCOR) 40 MG tablet Take 1 tablet (40 mg total) by mouth every evening.  90 tablet  0  . spironolactone (ALDACTONE) 25 MG tablet Take 1 tablet (25 mg total) by mouth daily.  90 tablet  3   No current facility-administered medications for this visit.    No Known Allergies  History   Social History  . Marital Status: Married    Spouse Name: Felicia    Number of Children: 2  . Years of Education: N/A   Occupational History  . Interior and spatial designer of Presenter, broadcasting for [employer]  .     Social History Main Topics  . Smoking status: Never Smoker   . Smokeless tobacco: Never Used  . Alcohol Use: 0 - 1 oz/week    0-2 drink(s) per week  . Drug Use:  No  . Sexual Activity: Not on file   Other Topics Concern  . Not on file   Social History Narrative   Lives in [place] with his wife and 2 children   Regular diet   No regular exercise    Family History  Problem Relation Age of Onset  . Hypertension Mother   . Alzheimer's disease Mother   . Hyperlipidemia Mother   . Hypertension Father   . Coronary artery disease Neg Hx   . Heart failure Neg Hx   . Heart disease Neg Hx   . Stroke Maternal Grandmother   . Arthritis      ROS- All systems are reviewed and negative except as per the HPI above  Physical Exam: Filed Vitals:   04/20/13 0902  BP: 131/80  Pulse: 79  Height: 6\' 1"  (1.854 m)  Weight: 299 lb (135.626 kg)    GEN- The patient is well appearing, alert and oriented x 3  today.   Head- normocephalic, atraumatic Eyes-  Sclera clear, conjunctiva pink Ears- hearing intact Oropharynx- clear Neck- supple, no JVP Lymph- no cervical lymphadenopathy Lungs- Clear to ausculation bilaterally, normal work of breathing Heart- Regular rate and rhythm, no murmurs, rubs or gallops, PMI not laterally displaced GI- soft, NT, ND, + BS Extremities- no clubbing, cyanosis, or edema MS- no significant deformity or atrophy Skin- no rash or lesion Psych- euthymic mood, full affect Neuro- strength and sensation are intact  EKG- sinus rhythm at 76, LBBB, QRS 180  Assessment and Plan:  1. Nonischemic CM/ LBBB The patient has a h/o a nonischemic CM with LBBB.  He has not had his EF assessed in 2 years but previously had an EF of 20%.  He is on a good medicine regiment but has not been compliant with office visits. I had a long discussion today about ICDs as an option.  Before we can move forward, we need to reassess his EF.  In addition, he needs to demonstrate compliance with office follow-up visits.  I will therefore order an echo and arrange follow-up with Dr Shirlee Latch.  If his EF remains <35% and he is interested then we could consider ICD implant in the future.  Otherwise I will see him as needed going forward.

## 2013-05-07 ENCOUNTER — Ambulatory Visit (HOSPITAL_BASED_OUTPATIENT_CLINIC_OR_DEPARTMENT_OTHER): Payer: BC Managed Care – PPO | Attending: Physician Assistant

## 2013-05-07 VITALS — Ht 73.0 in | Wt 295.0 lb

## 2013-05-07 DIAGNOSIS — G4730 Sleep apnea, unspecified: Secondary | ICD-10-CM

## 2013-05-07 DIAGNOSIS — G4710 Hypersomnia, unspecified: Secondary | ICD-10-CM | POA: Insufficient documentation

## 2013-05-07 DIAGNOSIS — Z9989 Dependence on other enabling machines and devices: Secondary | ICD-10-CM

## 2013-05-07 DIAGNOSIS — G4733 Obstructive sleep apnea (adult) (pediatric): Secondary | ICD-10-CM

## 2013-05-09 ENCOUNTER — Encounter: Payer: Self-pay | Admitting: Cardiovascular Disease

## 2013-05-09 ENCOUNTER — Ambulatory Visit (HOSPITAL_COMMUNITY): Payer: BC Managed Care – PPO | Attending: Cardiovascular Disease | Admitting: Cardiology

## 2013-05-09 DIAGNOSIS — Z6839 Body mass index (BMI) 39.0-39.9, adult: Secondary | ICD-10-CM | POA: Insufficient documentation

## 2013-05-09 DIAGNOSIS — I10 Essential (primary) hypertension: Secondary | ICD-10-CM | POA: Insufficient documentation

## 2013-05-09 DIAGNOSIS — I079 Rheumatic tricuspid valve disease, unspecified: Secondary | ICD-10-CM | POA: Insufficient documentation

## 2013-05-09 DIAGNOSIS — I509 Heart failure, unspecified: Secondary | ICD-10-CM | POA: Insufficient documentation

## 2013-05-09 DIAGNOSIS — I428 Other cardiomyopathies: Secondary | ICD-10-CM | POA: Insufficient documentation

## 2013-05-09 DIAGNOSIS — I5022 Chronic systolic (congestive) heart failure: Secondary | ICD-10-CM

## 2013-05-09 DIAGNOSIS — E785 Hyperlipidemia, unspecified: Secondary | ICD-10-CM | POA: Insufficient documentation

## 2013-05-09 DIAGNOSIS — I447 Left bundle-branch block, unspecified: Secondary | ICD-10-CM | POA: Insufficient documentation

## 2013-05-09 NOTE — Progress Notes (Signed)
Echo performed. 

## 2013-05-12 DIAGNOSIS — G4733 Obstructive sleep apnea (adult) (pediatric): Secondary | ICD-10-CM

## 2013-05-12 DIAGNOSIS — G4730 Sleep apnea, unspecified: Secondary | ICD-10-CM

## 2013-05-12 NOTE — Sleep Study (Signed)
   NAME: Gary Williamson DATE OF BIRTH:  Jan 19, 1966 MEDICAL RECORD NUMBER [id number]  LOCATION: [facility]  PHYSICIAN: Bayan Kushnir D  DATE OF STUDY: 05/07/2013  SLEEP STUDY TYPE: Nocturnal Polysomnogram               REFERRING PHYSICIAN: Argentina Donovan, PA-C  INDICATION FOR STUDY: Hypersomnia with sleep apnea. The patient underwent a diagnostic polysomnogram on 05/16/2012 showing an AHI of 17 per hour with body weight 286 pounds. CPAP titration is requested.  EPWORTH SLEEPINESS SCORE:   5/24 HEIGHT: 6\' 1"  (185.4 cm)  WEIGHT: 133.811 kg (295 lb)    Body mass index is 38.93 kg/(m^2).  NECK SIZE: 17 in.  MEDICATIONS: Charted for review  SLEEP ARCHITECTURE: Total sleep time 354.5 minutes with sleep efficiency 92.2%. Stage I was 7.9%, stage II 70.8%, stage III absent, REM 13.3% of total sleep time. Sleep latency 19 minutes, REM latency 82 minutes, awake after sleep onset 12.5 minutes, arousal index 11.5. Bedtime medication: None.  RESPIRATORY DATA: CPAP titration protocol. CPAP was titrated to 17 CWP with persistent events at that pressure. He was changed to bilevel and titrated to inspiratory pressure 21 and expiratory pressure 16 CWP for an AHI of 5.2 per hour. He wore a Simplus mask size medium.  OXYGEN DATA: Snoring was prevented at final CPAP and mean oxygen saturation of 93.2% on room air.  CARDIAC DATA: Sinus rhythm with PVCs  MOVEMENT/PARASOMNIA: A few limb jerks were noted with little effect on sleep. No bathroom trips.  IMPRESSION/ RECOMMENDATION:   1) Successful PAP required Bilevel PAP with inspiratory pressure 21 CWP and expiratory pressure 16 CWP. CPAP up to 17 CWP did not provide adequate control. He wore a Simplus mask size medium. Snoring was prevented and mean oxygen saturation held at 93.2% on room air. 2) A baseline polysomnogram on 05/16/2012 recorded AHI of 17 per hour with body weight 286 pounds.   Signed Baird Lyons M.D. Deneise Lever Diplomate, American Board of Sleep Medicine  ELECTRONICALLY SIGNED ON:  05/12/2013, 3:40 PM [place] PH: [phone]   FX: [phone] [place]

## 2013-05-15 ENCOUNTER — Encounter: Payer: Self-pay | Admitting: Physician Assistant

## 2013-05-15 DIAGNOSIS — R3129 Other microscopic hematuria: Secondary | ICD-10-CM | POA: Insufficient documentation

## 2013-05-15 HISTORY — DX: Other microscopic hematuria: R31.29

## 2013-05-18 ENCOUNTER — Encounter: Payer: Self-pay | Admitting: *Deleted

## 2013-05-22 ENCOUNTER — Encounter: Payer: Self-pay | Admitting: Physician Assistant

## 2013-05-22 DIAGNOSIS — R3129 Other microscopic hematuria: Secondary | ICD-10-CM

## 2013-05-31 ENCOUNTER — Telehealth: Payer: Self-pay | Admitting: *Deleted

## 2013-05-31 ENCOUNTER — Ambulatory Visit (INDEPENDENT_AMBULATORY_CARE_PROVIDER_SITE_OTHER): Payer: BC Managed Care – PPO | Admitting: Cardiology

## 2013-05-31 ENCOUNTER — Encounter: Payer: Self-pay | Admitting: Cardiology

## 2013-05-31 VITALS — BP 120/80 | HR 76 | Ht 73.0 in | Wt 292.0 lb

## 2013-05-31 DIAGNOSIS — I428 Other cardiomyopathies: Secondary | ICD-10-CM

## 2013-05-31 DIAGNOSIS — I5022 Chronic systolic (congestive) heart failure: Secondary | ICD-10-CM

## 2013-05-31 LAB — BASIC METABOLIC PANEL
BUN: 11 mg/dL (ref 6–23)
CALCIUM: 9 mg/dL (ref 8.4–10.5)
CO2: 26 mEq/L (ref 19–32)
CREATININE: 1.1 mg/dL (ref 0.4–1.5)
Chloride: 108 mEq/L (ref 96–112)
GFR: 91.4 mL/min (ref >60.00)
GLUCOSE: 100 mg/dL — AB (ref 70–99)
Potassium: 4.1 mEq/L (ref 3.5–5.1)
Sodium: 140 mEq/L (ref 135–145)

## 2013-05-31 MED ORDER — CARVEDILOL 25 MG PO TABS: 25.0000 mg | ORAL_TABLET | Freq: Two times a day (BID) | ORAL | Status: DC

## 2013-05-31 NOTE — Patient Instructions (Signed)
Increase coreg(carvedilol) to 25mg  two times a day. You can take 2 of your 12.5mg  tablets two times a day and use your current supply.  Your physician recommends that you return for lab work in: BMET.  Your physician has requested that you have a cardiac MRI. Cardiac MRI uses a computer to create images of your heart as its beating, producing both still and moving pictures of your heart and major blood vessels. For further information please visit [email or web address] Please follow the instruction sheet given to you today for more information. ASAP --should have this before he has ICD placed.  [name], Dr Jackalyn Lombard nurse will call you about scheduling ICD placement. Potential dates--2/10,2/13,2/17,2/19,2/14,2/26,3/3,3/5,3/6  Your physician wants you to follow-up in: 4 months. (May 2015). You will receive a reminder letter in the mail two months in advance. If you don't receive a letter, please call our office to schedule the follow-up appointment.

## 2013-05-31 NOTE — Telephone Encounter (Signed)
Please call this patient. Did Dr. Annamaria Boots refer him for CPAP set up? ----- Message -----   Lmom to cb

## 2013-05-31 NOTE — Progress Notes (Signed)
Patient ID: Gary Williamson, male   DOB: [birth year], 48 y.o.   MRN: [id number] PCP: Gary Williamson  48 yo with a h/o nonischemic cardiomyopathy, LBBB, HTN, and NYHA Class II CHF presents for cardiology followup.  He has been doing well recently.  I have not seen him in a couple of years. Echo in 12/14 showed that his EF remains 20-25%.  He walks for exercise with no dyspnea.  He is mildly short of breath walking up a hill or a flight of steps.  This is chronic.  His weight is up almost 40 lbs compared to the last time I saw him a couple of years ago. No chest pain, PND, or orthopnea.  He has been unable to take hydralazine/Imdur due to severe headaches even with 15 mg daily of Imdur.   ECG: NSR, LBBB with QRS 180 msec  Labs (8/11): K 4.5, creatinine 1.3, LDL 79, HDL 26 Labs (2/12): K 4.3, creatinine 1.5 Labs (7/12): K 4.5, creatinine 1.2 Labs (12/14): K 4.4, creatinine 1.2, LDL 87, HDL 32  Allergies (verified):  No Known Drug Allergies  Past Medical History: 1. Cardiomyopathy: Nonischemic.  He had a left heart cath in 2002 or 2003 with no significant coronary disease per his report (this was done in [place]). It sounds like his CMP has been present since about 2000.  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  Echo (5/11): severe LV dilation with EF < 20%, diffuse hypokinesis, moderate diastolic dysfunction, severe left atrial enlargement.  First hospitalization for CHF was in 5/11.  No drug use.  Adenosine myoview (6/11): EF 21%, diffuse hypokinesis, scar in the anterior and inferior walls and at the apex.  Mild peri-infarct ischemia.  LHC was done (7/11) showing EF 15%, global hypokinesis, LVEDP 22 mmHg, no angiographic CAD.  Echo (10/11): EF 20-25%, severely dilated LV with diffuse hypokinesis, mild MR.  Echo (12/14) with EF 20-25%, moderately dilated LV, diffuse hypokinesis, cannot rule out apical thrombus.  2. Hypertension 3. Chronic left bundle branch  block 4. Hyperlipidemia  Family History: Family history is negative for premature diagnosis of coronary artery disease or any arrhythmia or history of heart failure in first-degree relatives.  Mother and father do have a history of hypertension  Social History: The patient lives in [place] with his wife and 2 children.   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   Current Outpatient Prescriptions  Medication Sig Dispense Refill  . aspirin 81 MG EC tablet Take 1 tablet (81 mg total) by mouth daily.  30 tablet  2  . fish oil-omega-3 fatty acids 1000 MG capsule Take 1 capsule (1 g total) by mouth daily.  30 capsule  2  . furosemide (LASIX) 40 MG tablet Take 1 tablet (40 mg total) by mouth daily.  90 tablet  1  . lisinopril (PRINIVIL,ZESTRIL) 10 MG tablet Take 1 and 1/2 tablets twice a day  270 tablet  1  . Multiple Vitamin (MULTIVITAMIN) tablet Take 1 tablet by mouth daily.        . potassium chloride SA (K-DUR,KLOR-CON) 20 MEQ tablet Take 1 tablet (20 mEq total) by mouth daily.  90 tablet  2  . simvastatin (ZOCOR) 40  MG tablet Take 1 tablet (40 mg total) by mouth every evening.  90 tablet  0  . spironolactone (ALDACTONE) 25 MG tablet Take 1 tablet (25 mg total) by mouth daily.  90 tablet  3  . carvedilol (COREG) 25 MG tablet Take 1 tablet (25 mg total) by mouth 2 (two) times daily.  60 tablet  6   No current facility-administered medications for this visit.    BP 120/80  Pulse 76  Ht 6\' 1"  (1.854 m)  Wt 132.45 kg (292 lb)  BMI 38.53 kg/m2 General:  Well developed, well nourished, in no acute distress.  Obese.  Neck:  Neck supple, no JVD. No masses, thyromegaly or abnormal cervical nodes. Lungs:  Clear bilaterally to auscultation and percussion. Heart:   Non-displaced PMI, chest non-tender; regular rate and rhythm, S1, S2 without murmurs, rubs or gallops. S2 is paradoxically split.  Carotid upstroke normal, no bruit.  Pedals normal pulses. No edema, no varicosities. Abdomen:  Bowel sounds positive; abdomen soft and non-tender without masses, organomegaly, or hernias noted. No hepatosplenomegaly. Extremities:  No clubbing or cyanosis. Neurologic:  Alert and oriented x 3. Psych:  Normal affect.  Assessment/Plan: 1. Nonischemic cardiomyopathy: NYHA class II symptoms, stable.  Weight is up but he does not appear volume overloaded.  I suspect this is weight gain due to diet/lack of exercise.  He has been taking his medications faithfully.  Cardiomyopathy could be post-viral myocarditis, but would also consider the possibility of a LBBB cardiomyopathy.  He has had LBBB for many years, may have pre-dated the cardiomyopathy.   - Increase Coreg to 25 mg bid.  - Continue current doses of lisinopril and spironolactone.  He does not tolerate nitrates so no Bidil.  - I am going to refer him back to Dr. Rayann Williamson for CRT-D given NYHA class II symptoms, wide LBBB, and persistently low EF despite good medical regimen.  - BMET in 2/15.  - I am going to get a cardiac MRI prior to CRT-D placement.  Will look for evidence for infiltrative disease.  Also, on the last echo, could not rule out the possibility of apical thrombus (apex not seen well), so will look for LV thrombus with MRI.  2. Obesity: Patient needs to lose weight. We talked today about dietary changes and increasing exercise.   Gary Williamson 05/31/2013

## 2013-06-01 NOTE — Telephone Encounter (Signed)
It was ordered when sleep study was done:  1) Successful PAP required Bilevel PAP with inspiratory pressure 21 CWP and expiratory pressure 16 CWP. CPAP up to 17 CWP did not provide adequate control. He wore a Simplus mask size medium. Snoring was prevented and mean oxygen saturation held at 93.2% on room air.

## 2013-06-04 ENCOUNTER — Telehealth: Payer: Self-pay | Admitting: *Deleted

## 2013-06-04 NOTE — Telephone Encounter (Signed)
lmom for patient to call me in regards to setting up ICD with Dr [name]

## 2013-06-06 NOTE — Telephone Encounter (Signed)
lmom for patient to call me back to schedule procedure

## 2013-06-08 ENCOUNTER — Telehealth: Payer: Self-pay | Admitting: *Deleted

## 2013-06-08 NOTE — Telephone Encounter (Signed)
LMOM for pt to return call. 

## 2013-06-08 NOTE — Telephone Encounter (Signed)
The recent test was a CPAP titration, to determine what type of positive airway pressure machine he needs and at what settings he needs to treat his sleep apnea.   The titration study indicates that he needs Bilevel PAP with inspiratory pressure 21 CWP and expiratory pressure 16 CWP. That is what we need to get him set up with.  It's not clear to me that the sleep center referred him for the set up, or if they expect me to do it. I will do it, just didn't want to duplicate the orders.  The sleep study was done previously (05/2012, ordered by Freeman Caldron, PA-C), and revealed OSA, but he'd not followed up on it.  I ordered the titration to get him the treatment after I met him in 01/2013.  It's important that he treat this problem, as not doing so contributes to his heart dysfunction.

## 2013-06-08 NOTE — Telephone Encounter (Signed)
Pt returning Kelly's call to schedule ICD.  Lm for him to call back Tuesday when Leonia Reader will be in office. Will forward to [place].

## 2013-06-08 NOTE — Telephone Encounter (Signed)
Follow Up  Pt returned the call to schedule for the procedure.

## 2013-06-08 NOTE — Telephone Encounter (Signed)
You asked [name] to call pt to asked did he get his CPAP set up.  He has an appt to get that done but he stated that he did not get any results of his sleep study.  Can you review them?

## 2013-06-12 NOTE — Telephone Encounter (Signed)
LMOM for patient

## 2013-06-13 ENCOUNTER — Encounter: Payer: Self-pay | Admitting: Radiology

## 2013-06-13 NOTE — Telephone Encounter (Signed)
LMOM to CB. 

## 2013-06-13 NOTE — Telephone Encounter (Signed)
UTR letter sent. 

## 2013-06-13 NOTE — Telephone Encounter (Signed)
lmom to call me back to schedule his procedure

## 2013-06-14 NOTE — Telephone Encounter (Signed)
Follow up ° ° ° ° °Returned Kelly's call °

## 2013-06-14 NOTE — Telephone Encounter (Signed)
Spoke with patient and he is going to call hospital billing also

## 2013-06-14 NOTE — Telephone Encounter (Signed)
Spoke with patient and he is wanting numbers as to what the cost of the device is before he proceeds.  I have asked Charmaine.  She has given me an estimate of around 15 thousand and then ask that he call the hospital billing also.  I have left him a message to return my call

## 2013-06-21 ENCOUNTER — Encounter: Payer: Self-pay | Admitting: Cardiology

## 2013-06-21 NOTE — Telephone Encounter (Signed)
I spoke with the patient. He spoke with hospital billing yesterday and is waiting on a packet they were mailing to him prior to making that decision.

## 2013-06-29 NOTE — Telephone Encounter (Signed)
Please advise the patient that the difference between the two was that last time he didn't follow-up, have the titration and get the set-up for CPAP.  He had sleep apnea then, and still has it.  He needs CPAP to treat this condition.  Having untreated sleep apnea causes elevated blood pressure and strain on the heart, in addition to causing fatigue. It increases the risk of heart attack. I consider reducing the risk of these things a significant benefit, and that's why I sent him for the follow-up titration study.

## 2013-06-29 NOTE — Telephone Encounter (Signed)
Pt called back and he stated that when he compares the numbers on the results from the 05/2012 study done w/out CPAP to the numbers on results from 05/2013 study done with the CPAP, he doesn't see a big difference, but doesn't really know how to read the report. He requests a review of the difference between the results and comment to help him understand his need for the machine and that it will really benefit him. Chelle, please review.

## 2013-06-29 NOTE — Telephone Encounter (Signed)
Best # to call pt is cell # on record.

## 2013-07-01 NOTE — Telephone Encounter (Signed)
Pt notified of note

## 2013-07-09 ENCOUNTER — Ambulatory Visit (HOSPITAL_COMMUNITY)
Admission: RE | Admit: 2013-07-09 | Discharge: 2013-07-09 | Disposition: A | Discharge status: Home / Self Care | Payer: BC Managed Care – PPO | Source: Ambulatory Visit | Attending: Cardiology | Admitting: Cardiology

## 2013-07-09 DIAGNOSIS — I428 Other cardiomyopathies: Secondary | ICD-10-CM | POA: Insufficient documentation

## 2013-07-09 DIAGNOSIS — I5022 Chronic systolic (congestive) heart failure: Secondary | ICD-10-CM | POA: Insufficient documentation

## 2013-07-09 LAB — CREATININE, SERUM
CREATININE: 1.1 mg/dL (ref 0.50–1.35)
GFR, EST AFRICAN AMERICAN: 89 mL/min — AB (ref >90)
GFR, EST NON AFRICAN AMERICAN: 77 mL/min — AB (ref >90)

## 2013-07-09 MED ORDER — GADOBENATE DIMEGLUMINE 529 MG/ML IV SOLN: 30.0000 mL | Freq: Once | INTRAVENOUS | Status: DC

## 2013-07-20 ENCOUNTER — Telehealth: Payer: Self-pay | Admitting: Internal Medicine

## 2013-07-20 NOTE — Telephone Encounter (Signed)
New message    Calling to schedule procedure.

## 2013-07-23 NOTE — Telephone Encounter (Signed)
New Message:  Pt states he is calling to set up a time for his defib implant. Pt would like a call back from [facility].

## 2013-07-24 ENCOUNTER — Encounter: Payer: Self-pay | Admitting: *Deleted

## 2013-07-24 ENCOUNTER — Other Ambulatory Visit: Payer: Self-pay | Admitting: *Deleted

## 2013-07-24 DIAGNOSIS — I428 Other cardiomyopathies: Secondary | ICD-10-CM

## 2013-07-24 NOTE — Telephone Encounter (Signed)
Set up and patient aware

## 2013-07-28 ENCOUNTER — Other Ambulatory Visit: Payer: Self-pay | Admitting: Physician Assistant

## 2013-07-28 ENCOUNTER — Telehealth: Payer: Self-pay

## 2013-07-28 DIAGNOSIS — I5022 Chronic systolic (congestive) heart failure: Secondary | ICD-10-CM

## 2013-07-28 NOTE — Telephone Encounter (Signed)
Pt called and was trying to get his RX's @ pharmacy and pharmacy said he had to call and get e refill renewal for Spironolactone and Furosemide. Pt states he needs a 90 supply of both. [phone] Thank you

## 2013-07-30 MED ORDER — FUROSEMIDE 40 MG PO TABS: 40.0000 mg | ORAL_TABLET | Freq: Every day | ORAL | Status: DC

## 2013-07-30 NOTE — Telephone Encounter (Signed)
LMOM for pt that both Rxs were sent in today.

## 2013-08-03 ENCOUNTER — Telehealth: Payer: Self-pay

## 2013-08-03 ENCOUNTER — Other Ambulatory Visit: Payer: Self-pay

## 2013-08-03 DIAGNOSIS — I428 Other cardiomyopathies: Secondary | ICD-10-CM

## 2013-08-03 DIAGNOSIS — I5022 Chronic systolic (congestive) heart failure: Secondary | ICD-10-CM

## 2013-08-03 MED ORDER — SIMVASTATIN 40 MG PO TABS: 40.0000 mg | ORAL_TABLET | Freq: Every evening | ORAL | Status: DC

## 2013-08-03 MED ORDER — CARVEDILOL 25 MG PO TABS: 25.0000 mg | ORAL_TABLET | Freq: Two times a day (BID) | ORAL | Status: DC

## 2013-08-03 NOTE — Telephone Encounter (Signed)
Patient called stated he picked up his prescription from [facility] on [address] and he was only given a 30 day supply on Spironolactone and requested a 90 day supply.

## 2013-08-03 NOTE — Telephone Encounter (Signed)
Called and advised pt that we sent #90 in electronically on 07/30/13 and that maybe his pharmacy was looking at a Rx written at a different time. He will check w/pharm and have them call if they do not have Rx that was sent.

## 2013-08-07 ENCOUNTER — Ambulatory Visit: Payer: BC Managed Care – PPO | Admitting: Physician Assistant

## 2013-08-13 ENCOUNTER — Encounter (HOSPITAL_COMMUNITY): Payer: Self-pay | Admitting: Pharmacy Technician

## 2013-08-14 ENCOUNTER — Other Ambulatory Visit (INDEPENDENT_AMBULATORY_CARE_PROVIDER_SITE_OTHER): Payer: BC Managed Care – PPO

## 2013-08-14 DIAGNOSIS — I428 Other cardiomyopathies: Secondary | ICD-10-CM

## 2013-08-14 LAB — CBC WITH DIFFERENTIAL/PLATELET
BASOS PCT: 0.4 % (ref 0.0–3.0)
Basophils Absolute: 0 10*3/uL (ref 0.0–0.1)
Eosinophils Absolute: 0.2 10*3/uL (ref 0.0–0.7)
Eosinophils Relative: 2.4 % (ref 0.0–5.0)
HCT: 38.3 % — ABNORMAL LOW (ref 39.0–52.0)
HEMOGLOBIN: 12.6 g/dL — AB (ref 13.0–17.0)
Lymphocytes Relative: 22.1 % (ref 12.0–46.0)
Lymphs Abs: 1.8 10*3/uL (ref 0.7–4.0)
MCHC: 32.9 g/dL (ref 30.0–36.0)
MCV: 89.1 fl (ref 78.0–100.0)
Monocytes Absolute: 0.7 10*3/uL (ref 0.1–1.0)
Monocytes Relative: 9.2 % (ref 3.0–12.0)
NEUTROS ABS: 5.2 10*3/uL (ref 1.4–7.7)
Neutrophils Relative %: 65.9 % (ref 43.0–77.0)
Platelets: 190 10*3/uL (ref 150.0–400.0)
RBC: 4.29 Mil/uL (ref 4.22–5.81)
RDW: 14.9 % — ABNORMAL HIGH (ref 11.5–14.6)
WBC: 7.9 10*3/uL (ref 4.5–10.5)

## 2013-08-14 LAB — BASIC METABOLIC PANEL
BUN: 16 mg/dL (ref 6–23)
CALCIUM: 8.8 mg/dL (ref 8.4–10.5)
CO2: 25 mEq/L (ref 19–32)
Chloride: 109 mEq/L (ref 96–112)
Creatinine, Ser: 1.2 mg/dL (ref 0.4–1.5)
GFR: 86.76 mL/min (ref >60.00)
Glucose, Bld: 98 mg/dL (ref 70–99)
Potassium: 4.1 mEq/L (ref 3.5–5.1)
Sodium: 140 mEq/L (ref 135–145)

## 2013-08-17 ENCOUNTER — Telehealth: Payer: Self-pay | Admitting: Internal Medicine

## 2013-08-17 NOTE — Telephone Encounter (Signed)
Following up    On 4/21 procedure pt needs a call back on insturctions please. Pt was told to call today if he had not heard from this office by today.

## 2013-08-17 NOTE — Telephone Encounter (Signed)
All instructions given and letter left out front

## 2013-08-20 MED ORDER — SODIUM CHLORIDE 0.9 % IR SOLN
80.0000 mg | Status: DC
  Filled 2013-08-20: qty 2

## 2013-08-20 MED ORDER — CEFAZOLIN SODIUM 10 G IJ SOLR
3.0000 g | INTRAMUSCULAR | Status: DC
  Filled 2013-08-20: qty 3000

## 2013-08-21 ENCOUNTER — Encounter (HOSPITAL_COMMUNITY)
Admission: RE | Disposition: A | Discharge status: Home / Self Care | Payer: BC Managed Care – PPO | Source: Ambulatory Visit | Attending: Internal Medicine

## 2013-08-21 ENCOUNTER — Ambulatory Visit (HOSPITAL_COMMUNITY)
Admission: RE | Admit: 2013-08-21 | Discharge: 2013-08-22 | Disposition: A | Discharge status: Home / Self Care | Payer: BC Managed Care – PPO | Source: Ambulatory Visit | Attending: Internal Medicine | Admitting: Internal Medicine

## 2013-08-21 ENCOUNTER — Encounter (HOSPITAL_COMMUNITY): Payer: Self-pay

## 2013-08-21 DIAGNOSIS — I447 Left bundle-branch block, unspecified: Secondary | ICD-10-CM | POA: Insufficient documentation

## 2013-08-21 DIAGNOSIS — E7800 Pure hypercholesterolemia, unspecified: Secondary | ICD-10-CM

## 2013-08-21 DIAGNOSIS — I509 Heart failure, unspecified: Secondary | ICD-10-CM | POA: Insufficient documentation

## 2013-08-21 DIAGNOSIS — G4733 Obstructive sleep apnea (adult) (pediatric): Secondary | ICD-10-CM

## 2013-08-21 DIAGNOSIS — I10 Essential (primary) hypertension: Secondary | ICD-10-CM | POA: Insufficient documentation

## 2013-08-21 DIAGNOSIS — R3129 Other microscopic hematuria: Secondary | ICD-10-CM

## 2013-08-21 DIAGNOSIS — R9430 Abnormal result of cardiovascular function study, unspecified: Secondary | ICD-10-CM

## 2013-08-21 DIAGNOSIS — I428 Other cardiomyopathies: Secondary | ICD-10-CM

## 2013-08-21 DIAGNOSIS — I5022 Chronic systolic (congestive) heart failure: Secondary | ICD-10-CM | POA: Diagnosis present

## 2013-08-21 DIAGNOSIS — S53449A Ulnar collateral ligament sprain of unspecified elbow, initial encounter: Secondary | ICD-10-CM

## 2013-08-21 DIAGNOSIS — E669 Obesity, unspecified: Secondary | ICD-10-CM | POA: Insufficient documentation

## 2013-08-21 DIAGNOSIS — Z7982 Long term (current) use of aspirin: Secondary | ICD-10-CM | POA: Insufficient documentation

## 2013-08-21 DIAGNOSIS — E785 Hyperlipidemia, unspecified: Secondary | ICD-10-CM | POA: Insufficient documentation

## 2013-08-21 HISTORY — PX: BI-VENTRICULAR IMPLANTABLE CARDIOVERTER DEFIBRILLATOR: SHX5459

## 2013-08-21 LAB — SURGICAL PCR SCREEN
MRSA, PCR: NEGATIVE
Staphylococcus aureus: NEGATIVE

## 2013-08-21 SURGERY — BI-VENTRICULAR IMPLANTABLE CARDIOVERTER DEFIBRILLATOR  (CRT-D)
Anesthesia: LOCAL

## 2013-08-21 MED ORDER — MIDAZOLAM HCL 5 MG/5ML IJ SOLN
INTRAMUSCULAR | Status: AC
  Filled 2013-08-21: qty 5

## 2013-08-21 MED ORDER — LIDOCAINE HCL (PF) 1 % IJ SOLN
INTRAMUSCULAR | Status: AC
  Filled 2013-08-21: qty 30

## 2013-08-21 MED ORDER — HEPARIN (PORCINE) IN NACL 2-0.9 UNIT/ML-% IJ SOLN
INTRAMUSCULAR | Status: AC
Start: 2013-08-21 — End: 2013-08-21
  Filled 2013-08-21: qty 500

## 2013-08-21 MED ORDER — MUPIROCIN 2 % EX OINT
TOPICAL_OINTMENT | CUTANEOUS | Status: AC
  Administered 2013-08-21: 1 via NASAL
  Filled 2013-08-21: qty 22

## 2013-08-21 MED ORDER — MUPIROCIN 2 % EX OINT
TOPICAL_OINTMENT | Freq: Two times a day (BID) | CUTANEOUS | Status: DC
  Administered 2013-08-21: 1 via NASAL
  Filled 2013-08-21: qty 22

## 2013-08-21 MED ORDER — ACETAMINOPHEN 325 MG PO TABS: 325.0000 mg | ORAL_TABLET | ORAL | Status: DC | PRN

## 2013-08-21 MED ORDER — CARVEDILOL 25 MG PO TABS
25.0000 mg | ORAL_TABLET | Freq: Two times a day (BID) | ORAL | Status: DC
  Administered 2013-08-21 – 2013-08-22 (×2): 25 mg via ORAL
  Filled 2013-08-21 (×4): qty 1

## 2013-08-21 MED ORDER — LIDOCAINE HCL (PF) 1 % IJ SOLN
INTRAMUSCULAR | Status: AC
Start: 2013-08-21 — End: 2013-08-21
  Filled 2013-08-21: qty 30

## 2013-08-21 MED ORDER — CHLORHEXIDINE GLUCONATE 4 % EX LIQD
60.0000 mL | Freq: Once | CUTANEOUS | Status: DC
  Filled 2013-08-21: qty 60

## 2013-08-21 MED ORDER — SODIUM CHLORIDE 0.9 % IV SOLN: 250.0000 mL | INTRAVENOUS | Status: DC | PRN

## 2013-08-21 MED ORDER — HYDROCODONE-ACETAMINOPHEN 5-325 MG PO TABS
1.0000 | ORAL_TABLET | ORAL | Status: DC | PRN
  Administered 2013-08-21: 1 via ORAL
  Filled 2013-08-21: qty 2

## 2013-08-21 MED ORDER — LISINOPRIL 5 MG PO TABS
15.0000 mg | ORAL_TABLET | Freq: Two times a day (BID) | ORAL | Status: DC
  Administered 2013-08-21 – 2013-08-22 (×2): 15 mg via ORAL
  Filled 2013-08-21 (×3): qty 1

## 2013-08-21 MED ORDER — CEFAZOLIN SODIUM 1-5 GM-% IV SOLN
1.0000 g | Freq: Four times a day (QID) | INTRAVENOUS | Status: AC
  Administered 2013-08-21 – 2013-08-22 (×3): 1 g via INTRAVENOUS
  Filled 2013-08-21 (×3): qty 50

## 2013-08-21 MED ORDER — SODIUM CHLORIDE 0.9 % IJ SOLN: 3.0000 mL | Freq: Two times a day (BID) | INTRAMUSCULAR | Status: DC

## 2013-08-21 MED ORDER — ONDANSETRON HCL 4 MG/2ML IJ SOLN: 4.0000 mg | Freq: Four times a day (QID) | INTRAMUSCULAR | Status: DC | PRN

## 2013-08-21 MED ORDER — FUROSEMIDE 40 MG PO TABS
40.0000 mg | ORAL_TABLET | Freq: Every day | ORAL | Status: DC
  Administered 2013-08-22: 40 mg via ORAL
  Filled 2013-08-21 (×2): qty 1

## 2013-08-21 MED ORDER — POTASSIUM CHLORIDE CRYS ER 20 MEQ PO TBCR
20.0000 meq | EXTENDED_RELEASE_TABLET | Freq: Every day | ORAL | Status: DC
  Administered 2013-08-22: 20 meq via ORAL
  Filled 2013-08-21: qty 1

## 2013-08-21 MED ORDER — SODIUM CHLORIDE 0.9 % IV SOLN
INTRAVENOUS | Status: DC
  Administered 2013-08-21: 10:00:00 via INTRAVENOUS

## 2013-08-21 MED ORDER — SPIRONOLACTONE 25 MG PO TABS
25.0000 mg | ORAL_TABLET | Freq: Every day | ORAL | Status: DC
  Administered 2013-08-22: 25 mg via ORAL
  Filled 2013-08-21: qty 1

## 2013-08-21 MED ORDER — FENTANYL CITRATE 0.05 MG/ML IJ SOLN
INTRAMUSCULAR | Status: AC
  Filled 2013-08-21: qty 2

## 2013-08-21 MED ORDER — SODIUM CHLORIDE 0.9 % IJ SOLN: 3.0000 mL | INTRAMUSCULAR | Status: DC | PRN

## 2013-08-21 NOTE — Discharge Summary (Signed)
ELECTROPHYSIOLOGY PROCEDURE DISCHARGE SUMMARY    Patient ID: Gary Williamson,  MRN: [id number], DOB/AGE: 11/21/1965 48 y.o.  Admit date: 08/21/2013 Discharge date: 08/22/2013  Primary Care Physician: Wynne Dust Primary Cardiologist: [name] Electrophysiologist: Caleigh Rabelo  Primary Discharge Diagnosis:  Non ischemic cardiomyopathy, congestive heart failure, and LBBB status post CRTD implantation this admission  Secondary Discharge Diagnosis:  1.  Hypertension 2.  Obesity 3.  Hyperlipidemia  No Known Allergies  Procedures This Admission:  1.  Implantation of a STJ CRTD on 08-21-13 by Dr [name].  The patient received a STJ model number Quadra Assura ICD with model number 2088 right atrial lead, 7122 right ventricular lead, and 1458 left ventricular lead.  DFT's were deferred at time of implant.  There were no immediate post procedure complications. 2.  CXR on 08-22-13 demonstrated no pneumothorax status post device implantation.   Brief HPI: Gary Williamson is a 48 y.o. male was referred to electrophysiology in the outpatient setting for consideration of ICD implantation.  Past medical history includes non ischemic cardiomyopathy, congestive heart failure, and left bundle branch block.  The patient has persistent LV dysfunction despite guideline directed therapy.  Risks, benefits, and alternatives to CRTD implantation were reviewed with the patient who wished to proceed.   Hospital Course:  The patient was admitted and underwent implantation of a STJ CRTD with details as outlined above.   He was monitored on telemetry overnight which demonstrated sinus rhythm with ventricular pacing.  Left chest was without hematoma or ecchymosis.  The device was interrogated and found to be functioning normally.  CXR was obtained and demonstrated no pneumothorax status post device implantation.  Wound care, arm mobility, and restrictions were reviewed with the patient.  Dr [name] examined the  patient and considered them stable for discharge to home.   The patient's discharge medications include an ACE-I (Lisinopril) and beta blocker (Carvedilol).   Discharge Vitals: Blood pressure 121/67, pulse 80, temperature 98.6 F (37 C), temperature source Oral, resp. rate 18, height 6\' 1"  (1.854 m), weight 295 lb 8 oz (134.038 kg), SpO2 93.00%.   Physical Exam: Filed Vitals:   08/21/13 1853 08/21/13 2000 08/21/13 2100 08/22/13 0415  BP: 111/63 122/64 103/76 121/67  Pulse: 80 79 84 80  Temp:  98 F (36.7 C) 98.1 F (36.7 C) 98.6 F (37 C)  TempSrc:  Oral Oral Oral  Resp:  18 18 18   Height:      Weight:    295 lb 8 oz (134.038 kg)  SpO2: 96% 96% 96% 93%    GEN- The patient is well appearing, alert and oriented x 3 today.   Head- normocephalic, atraumatic Eyes-  Sclera clear, conjunctiva pink Ears- hearing intact Oropharynx- clear Neck- supple, no JVP Lymph- no cervical lymphadenopathy Lungs- Clear to ausculation bilaterally, normal work of breathing Heart- Regular rate and rhythm, no murmurs, rubs or gallops, PMI not laterally displaced GI- soft, NT, ND, + BS Extremities- no clubbing, cyanosis, or edema ICD site without hematoma Neuro- strength and sensation are intact  Labs:   Lab Results  Component Value Date   WBC 7.9 08/14/2013   HGB 12.6* 08/14/2013   HCT 38.3* 08/14/2013   MCV 89.1 08/14/2013   PLT 190.0 08/14/2013     Recent Labs Lab 08/22/13 0438  NA 141  K 4.2  CL 106  CO2 21  BUN 15  CREATININE 1.03  CALCIUM 8.7  GLUCOSE 109*     Discharge Medications:    Medication List  ASK your doctor about these medications       aspirin 81 MG EC tablet  Take 1 tablet (81 mg total) by mouth daily.     carvedilol 25 MG tablet  Commonly known as:  COREG  Take 1 tablet (25 mg total) by mouth 2 (two) times daily.     fish oil-omega-3 fatty acids 1000 MG capsule  Take 1 capsule (1 g total) by mouth daily.     furosemide 40 MG tablet  Commonly known  as:  LASIX  Take 1 tablet (40 mg total) by mouth daily.     lisinopril 10 MG tablet  Commonly known as:  PRINIVIL,ZESTRIL  Take 15 mg by mouth 2 (two) times daily.     multivitamin tablet  Take 1 tablet by mouth daily.     potassium chloride SA 20 MEQ tablet  Commonly known as:  K-DUR,KLOR-CON  Take 1 tablet (20 mEq total) by mouth daily.     simvastatin 40 MG tablet  Commonly known as:  ZOCOR  Take 1 tablet (40 mg total) by mouth every evening.     spironolactone 25 MG tablet  Commonly known as:  ALDACTONE  Take 25 mg by mouth daily.        Disposition:   Future Appointments Provider Department Dept Phone   09/03/2013 11:30 AM [facility] [place] [facility] [phone]   10/01/2013 8:15 AM Larey Dresser, MD [place] [facility] [phone]   11/21/2013 9:15 AM [name], MD [facility] [phone]       Duration of Discharge Encounter: Greater than 30 minutes including physician time.  Signed,  [name] MD

## 2013-08-21 NOTE — H&P (Signed)
Primary Care Physician: Wynne Dust  Referring Physician: [name], MD   Gary Williamson is a 48 y.o. male with a h/o nonischemic cardiomyopathy, hypertension, and left bundle branch block who presents today for BiV ICD implant. Echo in December of 2012 demonstrated an EF of 20% with diffuse hypokinesis, grade 1 diastolic dysfunction. He remains active.  Recent repeat echo reveals that EF remains 20%.  He feels that his exercise tolerance is preserved. He has some SOB with moderate activity. Today, he denies symptoms of palpitations, chest pain, shortness of breath at rest, orthopnea, PND, lower extremity edema, dizziness, presyncope, syncope, or neurologic sequela. The patient is tolerating medications without difficulties and is otherwise without complaint today.   Past Medical History   Diagnosis  Date   .  NICM (nonischemic cardiomyopathy)      Probably nonischemic; LHC in 2002/03 no sig CAD per report (done in [place]). Cardiomyopathy for 9-10 yrs. 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 Echo 5/11: severe LVE, EF <20%, diff HK, mod dias dys, sev. LAE. Hosp for CHF in 5/11. Ad. MV 6/11: EF 21%, mild per-infart ischemia. LHC (7/11): EF 15%, no CAD, LVEDP 22; Echo (10/11): EF 20-25%; Echo (12/12): EF 20%, mod LVH, Gr 1 DD, diff HK, mild LAE   .  Hypertension    .  LBBB (left bundle branch block)      Chronic   .  Hyperlipidemia    .  Chronic systolic heart failure     Past Surgical History   Procedure  Laterality  Date   .  Cardiac catheterization   2002 or 2003    Current Outpatient Prescriptions   Medication  Sig  Dispense  Refill   .  aspirin 81 MG EC tablet  Take 1 tablet (81 mg total) by mouth daily.  30 tablet  2   .  carvedilol (COREG) 12.5 MG tablet  Take 1 and 1/2 tablets twice a day.  270 tablet  3   .  fish oil-omega-3 fatty acids 1000 MG capsule  Take 1 capsule (1 g total) by mouth daily.  30 capsule  2   .  furosemide (LASIX) 40 MG tablet  Take 1 tablet (40 mg total)  by mouth daily.  90 tablet  1   .  lisinopril (PRINIVIL,ZESTRIL) 10 MG tablet  Take 1 and 1/2 tablets twice a day  270 tablet  1   .  Multiple Vitamin (MULTIVITAMIN) tablet  Take 1 tablet by mouth daily.     .  potassium chloride SA (K-DUR,KLOR-CON) 20 MEQ tablet  Take 1 tablet (20 mEq total) by mouth daily.  90 tablet  2   .  simvastatin (ZOCOR) 40 MG tablet  Take 1 tablet (40 mg total) by mouth every evening.  90 tablet  0   .  spironolactone (ALDACTONE) 25 MG tablet  Take 1 tablet (25 mg total) by mouth daily.  90 tablet  3    No current facility-administered medications for this visit.   No Known Allergies  History    Social History   .  Marital Status:  Married     Spouse Name:  Felicia     Number of Children:  2   .  Years of Education:  N/A    Occupational History   .  Mudlogger of Programmer, applications for [employer]   .      Social History Main Topics   .  Smoking  status:  Never Smoker   .  Smokeless tobacco:  Never Used   .  Alcohol Use:  0 - 1 oz/week     0-2 drink(s) per week   .  Drug Use:  No   .  Sexual Activity:  Not on file    Other Topics  Concern   .  Not on file    Social History Narrative    Lives in [place] with his wife and 2 children    Regular diet    No regular exercise    Family History   Problem  Relation  Age of Onset   .  Hypertension  Mother    .  Alzheimer's disease  Mother    .  Hyperlipidemia  Mother    .  Hypertension  Father    .  Coronary artery disease  Neg Hx    .  Heart failure  Neg Hx    .  Heart disease  Neg Hx    .  Stroke  Maternal Grandmother    .  Arthritis     ROS- All systems are reviewed and negative except as per the HPI above  Physical Exam:  Filed Vitals:  Filed Vitals:   08/21/13 0907  BP: 126/68  Pulse: 63  Temp: 97.6 F (36.4 C)  Resp: 20    GEN- The patient is well appearing, alert and oriented x 3 today.  Head- normocephalic, atraumatic  Eyes- Sclera clear, conjunctiva pink  Ears-  hearing intact  Oropharynx- clear  Neck- supple, no JVP  Lymph- no cervical lymphadenopathy  Lungs- Clear to ausculation bilaterally, normal work of breathing  Heart- Regular rate and rhythm, no murmurs, rubs or gallops, PMI not laterally displaced  GI- soft, NT, ND, + BS  Extremities- no clubbing, cyanosis, or edema   Assessment and Plan:  1. Nonischemic CM/ LBBB The patient has a h/o a nonischemic CM with LBBB.  At this time, he meets criteria for ICD implantation for primary prevention of sudden death. Given LBBB, he may also benefit from CRT. Risks, benefits, alternatives to BiV ICD implantation were discussed in detail with the patient today. The patient  understands that the risks include but are not limited to bleeding, infection, pneumothorax, perforation, tamponade, vascular damage, renal failure, MI, stroke, death, inappropriate shocks, and lead dislodgement and wishes to proceed at this time.      ICD Criteria  Current LVEF:0% ;Obtained > 6 months ago.   NYHA Functional Classification: Class II  Heart Failure History:  Yes, Duration of heart failure since onset is > 9 months  Non-Ischemic Dilated Cardiomyopathy History:  Yes, timeframe is > 9 months  Atrial Fibrillation/Atrial Flutter:  No.  Ventricular Tachycardia History:  No.  Cardiac Arrest History:  No  History of Syndromes with Risk of Sudden Death:  No.  Previous ICD:  No.  Electrophysiology Study: No.  Prior MI: No.  PPM: No.  OSA:  Yes  Patient Life Expectancy of >=1 year: Yes.  Anticoagulation Therapy:  Patient is NOT on anticoagulation therapy.   Beta Blocker Therapy:  Yes.   Ace Inhibitor/ARB Therapy:  Yes.

## 2013-08-21 NOTE — Op Note (Signed)
SURGEON:  [name], MD      PREPROCEDURE DIAGNOSES:   1. Nonischemic cardiomyopathy.   2. New York Heart Association class II, heart failure chronically.   3. Left bundle-branch block.      POSTPROCEDURE DIAGNOSES:   1. Nonischemic cardiomyopathy.   2. New York Heart Association class II heart failure chronically.   3. Left bundle-branch block.      PROCEDURES:    1. Left upper extremity venography  2. Biventricular ICD implantation.     INTRODUCTION:  Gary Williamson is a 48 y.o. male with a nonischemic CM (EF 20%), NYHA Class II CHF, and LBBB QRS morophology. At this time, he meets criteria for BiV ICD implantation for primary prevention of sudden death.  Given LBBB, the patient may also be expected to benefit from resynchronization therapy. The patient has been treated with an optimal medical regimen but continues to have a depressed ejection fraction and NYHA Class II CHF symptoms.  he therefore  presents today for a biventricular ICD implantation.      DESCRIPTION OF PROCEDURE:  Informed written consent was obtained and the patient was brought to the electrophysiology lab in the fasting state. The patient was adequately sedated with intravenous Versed, and fentanyl as outlined in the nursing report.  The patient's left chest was prepped and draped in the usual sterile fashion by the EP lab staff.  The skin overlying the left deltopectoral region was infiltrated with lidocaine for local analgesia.  A 5-cm incision was made over the left deltopectoral region.  A left subcutaneous defibrillator pocket was fashioned using a combination of sharp and blunt dissection.  Electrocautery was used to assure hemostasis.   RA/RV Lead Placement: The left axillary vein was cannulated with fluoroscopic visualization.  No contrast was required for this endeavor.  Through the left axillary vein, a St. Jude Medical Tendril  model [id number]  (serial # [id number]) right atrial lead and a [place], model 7122-65 (serial number [id number]) right ventricular defibrillator lead were advanced with fluoroscopic visualization into the right atrial appendage and right ventricular apex positions respectively.  Initial atrial lead P-waves measured 4.3 mV with an impedance of 493 ohms and a threshold of 0.6 volts at 0.5 milliseconds.  The right ventricular lead R-wave measured 17 mV with impedance of 500 ohms and a threshold of 0.3 volts at 0.5 milliseconds.   LV Lead Placement: A Medtronic MB-2 guide was advanced through the left axillary vein into the low lateral right atrium.  A Bard curved Damato catheter was introduced through the MB-2 guide and used to cannulate the coronary sinus.  This was a very difficult maneuver due to his atrial enlargement.  With Dr Forde Dandy assistance the CS was cannulated.   Coronary sinus cannulation was confirmed with electrogram recording from the hexapolar catheter.  A coronary sinus selective venography balloon was advanced through the MB- 2 guide and advanced into the proximal portion of the coronary sinus.  A selective coronary sinus venogram was performed by hand injection of nonionic contrast.  This demonstrated a moderate sized lateral coronary sinus branch as well as a more proximal branch which had a shepherds crook takeoff.   A Whisper CSJ wire was introduced through the guide and advanced into the lateral branch.  A [place] [id number]  (serial number [id number]) lead was advanced through the MB-2 into the branch.   This was  approximately one-thirds from the base to the apex in a very lateral  position.  In this location, the left ventricular lead R-waves measured  17 mV with impedance of 666 ohms and a threshold of 0.8 volt at 0.5  milliseconds in the LV1-2 bipolar configuration with no diaphragmatic  stimulation observed when pacing at 10 volts output.  The MB-2 guide was  therefore removed.     All three leads were secured to the pectoralis   fascia using #2 silk suture over the suture sleeves.  The pocket then  irrigated with copious gentamicin solution.  The leads were then  connected to a Mosier CD [place] (serial  Number [id number]) biventricular ICD.  The defibrillator was placed into the  pocket.  The pocket was then closed in 2 layers with 2.0 Vicryl suture  for the subcutaneous and subcuticular layers.  Steri-Strips and a  sterile dressing were then applied.   Defibrillation Threshold testing was not performed. The patient remained in sinus rhythm.  There were no early apparent complications.     CONCLUSIONS:   1. Nonischemic cardiomyopathy with Left bundle-branch block and chronic New York Heart Association class II heart failure.   2. Successful biventricular ICD implantation.   3. No early apparent complications.

## 2013-08-22 ENCOUNTER — Ambulatory Visit (HOSPITAL_COMMUNITY): Payer: BC Managed Care – PPO

## 2013-08-22 ENCOUNTER — Encounter (HOSPITAL_COMMUNITY): Payer: Self-pay | Admitting: *Deleted

## 2013-08-22 DIAGNOSIS — I5022 Chronic systolic (congestive) heart failure: Secondary | ICD-10-CM

## 2013-08-22 DIAGNOSIS — I509 Heart failure, unspecified: Secondary | ICD-10-CM

## 2013-08-22 DIAGNOSIS — I428 Other cardiomyopathies: Secondary | ICD-10-CM

## 2013-08-22 LAB — BASIC METABOLIC PANEL
BUN: 15 mg/dL (ref 6–23)
CHLORIDE: 106 meq/L (ref 96–112)
CO2: 21 meq/L (ref 19–32)
Calcium: 8.7 mg/dL (ref 8.4–10.5)
Creatinine, Ser: 1.03 mg/dL (ref 0.50–1.35)
GFR calc Af Amer: >90 mL/min (ref >90)
GFR, EST NON AFRICAN AMERICAN: 84 mL/min — AB (ref >90)
GLUCOSE: 109 mg/dL — AB (ref 70–99)
POTASSIUM: 4.2 meq/L (ref 3.7–5.3)
Sodium: 141 mEq/L (ref 137–147)

## 2013-08-22 NOTE — Discharge Instructions (Signed)
° °  Supplemental Discharge Instructions for  Pacemaker/Defibrillator Patients  Activity No heavy lifting or vigorous activity with your left/right arm for 6 to 8 weeks.  Do not raise your left/right arm above your head for one week.  Gradually raise your affected arm as drawn below.           04/25                      04/26                       04/27                      04/28       NO DRIVING for 1 week; you may begin driving on 82/95/6213. WOUND CARE   Keep the wound area clean and dry.  Do not get this area wet for one week. No showers for one week; you may shower on 08/30/2013.   The tape/steri-strips on your wound will fall off; do not pull them off.  No bandage is needed on the site.  DO NOT apply any creams, oils, or ointments to the wound area.   If you notice any drainage or discharge from the wound, any swelling or bruising at the site, or you develop a fever > 101? F after you are discharged home, call the office at once.  Special Instructions   You are still able to use cellular telephones; use the ear opposite the side where you have your pacemaker/defibrillator.  Avoid carrying your cellular phone near your device.   When traveling through airports, show security personnel your identification card to avoid being screened in the metal detectors.  Ask the security personnel to use the hand wand.   Avoid arc welding equipment, MRI testing (magnetic resonance imaging), TENS units (transcutaneous nerve stimulators).  Call the office for questions about other devices.   Avoid electrical appliances that are in poor condition or are not properly grounded.   Microwave ovens are safe to be near or to operate.  Additional information for defibrillator patients should your device go off:   If your device goes off ONCE and you feel fine afterward, notify the device clinic nurses.   If your device goes off ONCE and you do not feel well afterward, call 911.   If your device goes off TWICE,  call 911.   If your device goes off THREE times in one day, call 911.  DO NOT DRIVE YOURSELF OR A FAMILY MEMBER WITH A DEFIBRILLATOR TO THE HOSPITAL--CALL 911.

## 2013-09-03 ENCOUNTER — Telehealth: Payer: Self-pay

## 2013-09-03 ENCOUNTER — Ambulatory Visit: Payer: BC Managed Care – PPO

## 2013-09-03 DIAGNOSIS — G4733 Obstructive sleep apnea (adult) (pediatric): Secondary | ICD-10-CM

## 2013-09-03 NOTE — Telephone Encounter (Signed)
Sherrice with [facility], called for the patient requesting a prescription for a chin strap that is needed for his breathing mask.  She says the only thing the prescription needs to say is chin strap.  For questions call her at [phone]. [phone] or fax the prescription to [phone].

## 2013-09-04 NOTE — Telephone Encounter (Signed)
Patient needs new chin strap/ for CPAP order in computer.

## 2013-09-04 NOTE — Telephone Encounter (Signed)
I will put the order in Epic, and send electronic, no need to fax. I have pended, send back to me when signed so I can alert [facility] the order is in system, thanks.

## 2013-09-04 NOTE — Telephone Encounter (Signed)
Order signed.

## 2013-09-05 ENCOUNTER — Telehealth: Payer: Self-pay | Admitting: Physician Assistant

## 2013-09-05 NOTE — Telephone Encounter (Signed)
Ester Rink from [facility] is calling in reference to patient's CPAP machine and chin strap. States that she needs a new prescription for this. Please call [phone] for more information.

## 2013-09-05 NOTE — Telephone Encounter (Signed)
Tried to call [facility] no answer on the doctor line. Order for mask and chin strap faxed to [facility] per telephone call on 09/04/13.

## 2013-09-06 NOTE — Telephone Encounter (Signed)
Fax from [facility]. I signed the order, but there is a page about a face-to-face evaluation.  I do not have a report documenting his usage, etc. I have put the form in the Nurse In Box.

## 2013-09-07 NOTE — Telephone Encounter (Signed)
Spoke to [employer]- the advised a note stating that we are unable to complete the section for face to face evaluation. Faxed order. Placed order to be scanned.

## 2013-09-13 ENCOUNTER — Ambulatory Visit (INDEPENDENT_AMBULATORY_CARE_PROVIDER_SITE_OTHER): Payer: BC Managed Care – PPO | Admitting: *Deleted

## 2013-09-13 ENCOUNTER — Encounter: Payer: Self-pay | Admitting: Internal Medicine

## 2013-09-13 DIAGNOSIS — I428 Other cardiomyopathies: Secondary | ICD-10-CM

## 2013-09-13 DIAGNOSIS — I5022 Chronic systolic (congestive) heart failure: Secondary | ICD-10-CM

## 2013-09-13 DIAGNOSIS — I509 Heart failure, unspecified: Secondary | ICD-10-CM

## 2013-09-13 LAB — MDC_IDC_ENUM_SESS_TYPE_INCLINIC
HighPow Impedance: 56.25 Ohm
Implantable Pulse Generator Serial Number: 7157119
Lead Channel Impedance Value: 362.5 Ohm
Lead Channel Impedance Value: 512.5 Ohm
Lead Channel Impedance Value: 687.5 Ohm
Lead Channel Pacing Threshold Amplitude: 0.5 V
Lead Channel Pacing Threshold Amplitude: 0.5 V
Lead Channel Pacing Threshold Amplitude: 0.5 V
Lead Channel Pacing Threshold Amplitude: 0.75 V
Lead Channel Pacing Threshold Pulse Width: 0.5 ms
Lead Channel Pacing Threshold Pulse Width: 0.5 ms
Lead Channel Pacing Threshold Pulse Width: 0.5 ms
Lead Channel Pacing Threshold Pulse Width: 0.5 ms
Lead Channel Sensing Intrinsic Amplitude: 4.2 mV
Lead Channel Setting Pacing Amplitude: 3.5 V
Lead Channel Setting Pacing Amplitude: 3.5 V
Lead Channel Setting Pacing Amplitude: 3.5 V
Lead Channel Setting Pacing Pulse Width: 0.5 ms
Lead Channel Setting Pacing Pulse Width: 0.5 ms
MDC IDC MSMT BATTERY REMAINING LONGEVITY: 52.8 mo
MDC IDC MSMT LEADCHNL LV PACING THRESHOLD AMPLITUDE: 0.75 V
MDC IDC MSMT LEADCHNL LV PACING THRESHOLD PULSEWIDTH: 0.5 ms
MDC IDC MSMT LEADCHNL RV PACING THRESHOLD AMPLITUDE: 0.5 V
MDC IDC MSMT LEADCHNL RV PACING THRESHOLD PULSEWIDTH: 0.5 ms
MDC IDC MSMT LEADCHNL RV SENSING INTR AMPL: 11.9 mV
MDC IDC SESS DTM: 20150514165713
MDC IDC SET LEADCHNL RV SENSING SENSITIVITY: 0.5 mV
MDC IDC SET ZONE DETECTION INTERVAL: 250 ms
MDC IDC SET ZONE DETECTION INTERVAL: 300 ms
MDC IDC STAT BRADY RA PERCENT PACED: 1.2 %
MDC IDC STAT BRADY RV PERCENT PACED: 99 %

## 2013-09-13 NOTE — Progress Notes (Signed)
Wound check appointment. Steri-strips removed. Wound without redness or edema. Incision edges approximated, wound well healed. Normal device function. Sensing, and impedances consistent with implant measurements. RA & RV thresholds consistent, LV significantly increased---changed vector from M3-M4 to D1-P4. Device programmed at 3.5V for extra safety margin until 3 month visit. Histogram distribution appropriate for patient and level of activity. No mode switches or ventricular arrhythmias noted. Patient educated about wound care, arm mobility, lifting restrictions, shock plan. ROV w/ JA 11/21/13 @9 :15.

## 2013-09-19 ENCOUNTER — Telehealth: Payer: Self-pay | Admitting: *Deleted

## 2013-09-19 NOTE — Telephone Encounter (Signed)
Message copied by Katrine Coho on Wed Sep 19, 2013 10:57 AM ------      Message from: Marcine Matar      Created: Tue Jul 10, 2013  4:44 PM      Regarding: FW: Cardiac MRI       Webb Silversmith, please read e-mail below.             gesila         ----- Message -----         From: Winnifred Friar         Sent: 07/10/2013   5:55 AM           To: Marcine Matar      Subject: RE: Cardiac MRI                                          We tried this pt yesterday and he did not fit in scanner. Thx      ----- Message -----         From: Marcine Matar         Sent: 06/14/2013   2:51 PM           To: Winnifred Friar      Subject: FW: Cardiac MRI                                                      ----- Message -----         From: Marcine Matar         Sent: 06/07/2013   3:23 PM           To: Windy Fast [facility] Pre Cert/Auth      Subject: FW: Cardiac MRI                                          MCLEAN : 2/13, 2/16, 2/17, 2/23, 2/25,             GESILA       ----- Message -----         From: Corinna Lines         Sent: 05/31/2013   9:03 AM           To: Katrine Coho, RN, Lillia Pauls, #      Subject: Cardiac MRI                                              Forward all information to the above persons. Cardiac MRI.                    ------

## 2013-09-20 ENCOUNTER — Encounter: Payer: Self-pay | Admitting: *Deleted

## 2013-09-20 ENCOUNTER — Encounter: Payer: Self-pay | Admitting: Cardiology

## 2013-09-30 ENCOUNTER — Encounter: Payer: Self-pay | Admitting: Physician Assistant

## 2013-09-30 DIAGNOSIS — G4733 Obstructive sleep apnea (adult) (pediatric): Secondary | ICD-10-CM

## 2013-10-01 ENCOUNTER — Encounter: Payer: Self-pay | Admitting: *Deleted

## 2013-10-01 ENCOUNTER — Ambulatory Visit (INDEPENDENT_AMBULATORY_CARE_PROVIDER_SITE_OTHER): Payer: BC Managed Care – PPO | Admitting: Cardiology

## 2013-10-01 ENCOUNTER — Encounter: Payer: Self-pay | Admitting: Cardiology

## 2013-10-01 VITALS — BP 122/73 | HR 66 | Ht 73.0 in | Wt 298.0 lb

## 2013-10-01 DIAGNOSIS — I5022 Chronic systolic (congestive) heart failure: Secondary | ICD-10-CM

## 2013-10-01 LAB — LIPID PANEL
CHOL/HDL RATIO: 4
CHOLESTEROL: 136 mg/dL (ref 0–200)
HDL: 30.6 mg/dL — ABNORMAL LOW (ref >39.00)
LDL Cholesterol: 92 mg/dL (ref 0–99)
TRIGLYCERIDES: 65 mg/dL (ref 0.0–149.0)
VLDL: 13 mg/dL (ref 0.0–40.0)

## 2013-10-01 LAB — BASIC METABOLIC PANEL
BUN: 12 mg/dL (ref 6–23)
CHLORIDE: 104 meq/L (ref 96–112)
CO2: 27 meq/L (ref 19–32)
CREATININE: 1.3 mg/dL (ref 0.4–1.5)
Calcium: 9.1 mg/dL (ref 8.4–10.5)
GFR: 78.03 mL/min (ref >60.00)
Glucose, Bld: 98 mg/dL (ref 70–99)
POTASSIUM: 4.3 meq/L (ref 3.5–5.1)
Sodium: 139 mEq/L (ref 135–145)

## 2013-10-01 NOTE — Patient Instructions (Signed)
Your physician recommends that you have lab work today--Lipid profile/BMET.  Your physician recommends that you schedule a follow-up appointment in: 4 months with Dr [name] in the [facility].   Your physician has requested that you have an echocardiogram. Echocardiography is a painless test that uses sound waves to create images of your heart. It provides your doctor with information about the size and shape of your heart and how well your heart's chambers and valves are working. This procedure takes approximately one hour. There are no restrictions for this procedure. IN 4 MONTHS PRIOR TO YOUR APPT WITH DR [facility]. This can be done at [facility].

## 2013-10-01 NOTE — Progress Notes (Signed)
Patient ID: Gary Williamson, male   DOB: [birth year], 48 y.o.   MRN: [id number] PCP: Harrison Mons  48 yo with a h/o nonischemic cardiomyopathy, LBBB, HTN, and NYHA Class II CHF presents for cardiology followup.  He has been doing well recently.  Echo in 12/14 showed that his EF remains 20-25%.  He had a St Jude CRT-D system placed in 4/15.  He feels like he may be a little less winded with heavy exercise since he had CRT.  He walks for exercise with no dyspnea.  He is mildly short of breath walking up a hill or a flight of steps.  This is chronic.  His weight continues to rise, it is up about 6 lbs. No chest pain, PND, or orthopnea.  He has been unable to take hydralazine/Imdur due to severe headaches even with 15 mg daily of Imdur.   Labs (8/11): K 4.5, creatinine 1.3, LDL 79, HDL 26 Labs (2/12): K 4.3, creatinine 1.5 Labs (7/12): K 4.5, creatinine 1.2 Labs (12/14): K 4.4, creatinine 1.2, LDL 87, HDL 32 Labs (4/15): K 4.2, creatinine 1.03  ECG: a-sensed, v-paced (LBBB-morphology)  Allergies (verified):  No Known Drug Allergies  Past Medical History: 1. Cardiomyopathy: Nonischemic.  He had a left heart cath in 2002 or 2003 with no significant coronary disease per his report (this was done in [place]). It sounds like his CMP has been present since about 2000.  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  Echo (5/11): severe LV dilation with EF < 20%, diffuse hypokinesis, moderate diastolic dysfunction, severe left atrial enlargement.  First hospitalization for CHF was in 5/11.  No drug use.  Adenosine myoview (6/11): EF 21%, diffuse hypokinesis, scar in the anterior and inferior walls and at the apex.  Mild peri-infarct ischemia.  LHC was done (7/11) showing EF 15%, global hypokinesis, LVEDP 22 mmHg, no angiographic CAD.  Echo (10/11): EF 20-25%, severely dilated LV with diffuse hypokinesis, mild MR.  Echo (12/14) with EF 20-25%, moderately dilated LV, diffuse  hypokinesis, cannot rule out apical thrombus.  He was unable to tolerate cardiac MRI.  He had St Jude CRT-D implantation in 4/15.  2. Hypertension 3. Chronic left bundle branch block 4. Hyperlipidemia  Family History: Family history is negative for premature diagnosis of coronary artery disease or any arrhythmia or history of heart failure in first-degree relatives.  Mother and father do have a history of hypertension  Social History: The patient lives in [place] with his wife and 2 children.   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   Current Outpatient Prescriptions  Medication Sig Dispense Refill  . aspirin 81 MG EC tablet Take 1 tablet (81 mg total) by mouth daily.  30 tablet  2  . carvedilol (COREG) 25 MG tablet Take 1 tablet (25 mg total) by mouth 2 (two) times daily.  180 tablet  2  . fish oil-omega-3 fatty acids 1000 MG capsule Take 1 capsule (1 g total) by mouth daily.  30 capsule  2  . furosemide (LASIX) 40 MG tablet Take 1 tablet (40 mg total) by mouth daily.  90 tablet  0  . lisinopril (PRINIVIL,ZESTRIL) 10 MG tablet Take 15 mg by mouth  2 (two) times daily.      . Multiple Vitamin (MULTIVITAMIN) tablet Take 1 tablet by mouth daily.        . potassium chloride SA (K-DUR,KLOR-CON) 20 MEQ tablet Take 1 tablet (20 mEq total) by mouth daily.  90 tablet  2  . simvastatin (ZOCOR) 40 MG tablet Take 1 tablet (40 mg total) by mouth every evening.  90 tablet  0  . spironolactone (ALDACTONE) 25 MG tablet Take 25 mg by mouth daily.       No current facility-administered medications for this visit.    BP 122/73  Pulse 66  Ht 6\' 1"  (1.854 m)  Wt 135.172 kg (298 lb)  BMI 39.32 kg/m2 General:  Well developed, well nourished, in no acute distress.  Obese.  Neck:   Neck supple, no JVD. No masses, thyromegaly or abnormal cervical nodes. Lungs:  Clear bilaterally to auscultation and percussion. Heart:  Non-displaced PMI, chest non-tender; regular rate and rhythm, S1, S2 without murmurs, rubs or gallops. S2 is paradoxically split.  Carotid upstroke normal, no bruit.  Pedals normal pulses. No edema, no varicosities. Abdomen:  Bowel sounds positive; abdomen soft and non-tender without masses, organomegaly, or hernias noted. No hepatosplenomegaly. Extremities:  No clubbing or cyanosis. Neurologic:  Alert and oriented x 3. Psych:  Normal affect.  Assessment/Plan: 1. Nonischemic cardiomyopathy: NYHA class II symptoms, stable.  Weight is up but he does not appear volume overloaded.  I suspect this is weight gain due to diet/lack of exercise.  He has been taking his medications faithfully.  Cardiomyopathy could be post-viral myocarditis, but would also consider the possibility of a LBBB cardiomyopathy.  He has had LBBB for many years, may have pre-dated the cardiomyopathy.  He now has a Research officer, political party CRT-D system.  - Continue current doses of Coreg, lisinopril and spironolactone.  He does not tolerate nitrates so no Bidil.  - He has an unusual QRS axis for BiV pacing.  I had his device checked today and his LV lead is working properly.   2. Obesity: Patient needs to lose weight. We talked today about dietary changes and increasing exercise.   Followup in CHF clinic in 4 months with BMET, lipids, and echo.   Larey Dresser 10/01/2013

## 2013-11-19 ENCOUNTER — Encounter: Payer: Self-pay | Admitting: *Deleted

## 2013-11-21 ENCOUNTER — Encounter: Payer: Self-pay | Admitting: Internal Medicine

## 2013-11-21 ENCOUNTER — Ambulatory Visit (INDEPENDENT_AMBULATORY_CARE_PROVIDER_SITE_OTHER): Payer: BC Managed Care – PPO | Admitting: Internal Medicine

## 2013-11-21 VITALS — BP 137/74 | HR 92 | Ht 73.0 in | Wt 306.2 lb

## 2013-11-21 DIAGNOSIS — I5022 Chronic systolic (congestive) heart failure: Secondary | ICD-10-CM

## 2013-11-21 DIAGNOSIS — I509 Heart failure, unspecified: Secondary | ICD-10-CM

## 2013-11-21 DIAGNOSIS — I428 Other cardiomyopathies: Secondary | ICD-10-CM

## 2013-11-21 LAB — MDC_IDC_ENUM_SESS_TYPE_INCLINIC
Date Time Interrogation Session: 20150722112653
HighPow Impedance: 63 Ohm
Lead Channel Impedance Value: 650 Ohm
Lead Channel Impedance Value: 787.5 Ohm
Lead Channel Pacing Threshold Amplitude: 0.5 V
Lead Channel Pacing Threshold Amplitude: 0.5 V
Lead Channel Pacing Threshold Amplitude: 0.625 V
Lead Channel Pacing Threshold Amplitude: 0.75 V
Lead Channel Pacing Threshold Pulse Width: 0.5 ms
Lead Channel Pacing Threshold Pulse Width: 0.5 ms
Lead Channel Pacing Threshold Pulse Width: 0.5 ms
Lead Channel Sensing Intrinsic Amplitude: 3.9 mV
Lead Channel Setting Pacing Amplitude: 2 V
Lead Channel Setting Pacing Amplitude: 2 V
Lead Channel Setting Pacing Amplitude: 2 V
Lead Channel Setting Pacing Pulse Width: 0.5 ms
Lead Channel Setting Pacing Pulse Width: 0.5 ms
MDC IDC MSMT BATTERY REMAINING LONGEVITY: 81.6 mo
MDC IDC MSMT LEADCHNL LV PACING THRESHOLD AMPLITUDE: 0.75 V
MDC IDC MSMT LEADCHNL LV PACING THRESHOLD PULSEWIDTH: 0.5 ms
MDC IDC MSMT LEADCHNL RA IMPEDANCE VALUE: 412.5 Ohm
MDC IDC MSMT LEADCHNL RV PACING THRESHOLD AMPLITUDE: 0.75 V
MDC IDC MSMT LEADCHNL RV PACING THRESHOLD PULSEWIDTH: 0.5 ms
MDC IDC MSMT LEADCHNL RV PACING THRESHOLD PULSEWIDTH: 0.5 ms
MDC IDC MSMT LEADCHNL RV SENSING INTR AMPL: 11.9 mV
MDC IDC PG SERIAL: 7157119
MDC IDC SET LEADCHNL RV SENSING SENSITIVITY: 0.5 mV
MDC IDC SET ZONE DETECTION INTERVAL: 300 ms
MDC IDC STAT BRADY RA PERCENT PACED: 4.5 %
MDC IDC STAT BRADY RV PERCENT PACED: 99 %
Zone Setting Detection Interval: 250 ms

## 2013-11-21 NOTE — Progress Notes (Signed)
PCP: JEFFERY,CHELLE, PA-C Primary Cardiologist:  Dr Sharlee Blew Gary Williamson is a 48 y.o. male who presents today for routine electrophysiology followup.  Since his recent BiV ICD implant, the patient reports doing very well. His exercise tolerance has improved.  He notices now that he can climb a flight of stairs easier than previously.   Today, he denies symptoms of palpitations, chest pain, shortness of breath,  lower extremity edema, dizziness, presyncope, syncope, or ICD shocks.  The patient is otherwise without complaint today.   Past Medical History  Diagnosis Date  . NICM (nonischemic cardiomyopathy)     Probably nonischemic; LHC in 2002/03 no sig CAD per report (done in [place]). Cardiomyopathy for 9-10 yrs. 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 Echo 5/11: severe LVE, EF <20%, diff HK, mod dias dys, sev. LAE. Hosp for CHF in 5/11. Ad. MV 6/11: EF 21%, mild per-infart ischemia. LHC (7/11): EF 15%, no CAD, LVEDP 22; Echo (10/11): EF 20-25%;  Echo (12/12):  EF 20%, mod LVH, Gr 1 DD, diff HK, mild LAE  . Hypertension   . LBBB (left bundle branch block)     Chronic  . Pure hypercholesterolemia   . Chronic systolic heart failure    Past Surgical History  Procedure Laterality Date  . Cardiac catheterization  2002 or 2003  . Bi-ventricular implantable cardioverter defibrillator  (crt-d)  08/21/13    STJ Quadra Assura CRTD implanted by Dr [name]  . Left heart cath  11/13/09    Dr [name]    ROS- all systems are reviewed and negative except as per HPI above  Current Outpatient Prescriptions  Medication Sig Dispense Refill  . aspirin 81 MG EC tablet Take 1 tablet (81 mg total) by mouth daily.  30 tablet  2  . carvedilol (COREG) 25 MG tablet Take 1 tablet (25 mg total) by mouth 2 (two) times daily.  180 tablet  2  . fish oil-omega-3 fatty acids 1000 MG capsule Take 1 capsule (1 g total) by mouth daily.  30 capsule  2  . furosemide (LASIX) 40 MG tablet Take 1 tablet (40 mg total) by mouth daily.   90 tablet  0  . lisinopril (PRINIVIL,ZESTRIL) 10 MG tablet Take 15 mg by mouth 2 (two) times daily.      . Multiple Vitamin (MULTIVITAMIN) tablet Take 1 tablet by mouth daily.        . potassium chloride SA (K-DUR,KLOR-CON) 20 MEQ tablet Take 1 tablet (20 mEq total) by mouth daily.  90 tablet  2  . simvastatin (ZOCOR) 40 MG tablet Take 1 tablet (40 mg total) by mouth every evening.  90 tablet  0  . spironolactone (ALDACTONE) 25 MG tablet Take 25 mg by mouth daily.       No current facility-administered medications for this visit.    Physical Exam: Filed Vitals:   11/21/13 0905  BP: 137/74  Pulse: 92  Height: 6\' 1"  (1.854 m)  Weight: 306 lb 3.2 oz (138.891 kg)    GEN- The patient is well appearing, alert and oriented x 3 today.   Head- normocephalic, atraumatic Eyes-  Sclera clear, conjunctiva pink Ears- hearing intact Oropharynx- clear Lungs- Clear to ausculation bilaterally, normal work of breathing Chest- ICD pocket is well healed Heart- Regular rate and rhythm, no murmurs, rubs or gallops, PMI not laterally displaced GI- soft, NT, ND, + BS Extremities- no clubbing, cyanosis, or edema  ICD interrogation- reviewed in detail today,  See PACEART report ekg 10/01/13 reveals sinus  with BiV pacing  Assessment and Plan:  1.  Chronic systolic dysfunction euvolemic today Stable on an appropriate medical regimen Normal BiV ICD function See Pace Art report No changes today Dr [name] to repeat echo in 3 months Dr [name] would like for the CHF team to follow coreview.  I will follow Merlin every 3 months Return to see me in 1 year

## 2013-11-21 NOTE — Patient Instructions (Addendum)
Your physician wants you to follow-up in: 12 months with Dr Vallery Ridge will receive a reminder letter in the mail two months in advance. If you don't receive a letter, please call our office to schedule the follow-up appointment.   Remote monitoring is used to monitor your Pacemaker or ICD from home. This monitoring reduces the number of office visits required to check your device to one time per year. It allows [name] to keep an eye on the functioning of your device to ensure it is working properly. You are scheduled for a device check from home on 02/20/14. You may send your transmission at any time that day. If you have a wireless device, the transmission will be sent automatically. After your physician reviews your transmission, you will receive a postcard with your next transmission date.

## 2013-12-04 ENCOUNTER — Other Ambulatory Visit: Payer: Self-pay | Admitting: *Deleted

## 2013-12-09 ENCOUNTER — Telehealth: Payer: Self-pay

## 2013-12-09 NOTE — Telephone Encounter (Signed)
Patient requesting refill on "Spironolactone (Aldactone) 25mg . Patient states he is completing out. Please call in to [facility]. Patients call back number is [phone]

## 2013-12-10 MED ORDER — SPIRONOLACTONE 25 MG PO TABS: 25.0000 mg | ORAL_TABLET | Freq: Every day | ORAL | Status: DC

## 2013-12-10 NOTE — Telephone Encounter (Signed)
Lm advising pt to rtn call to schedule an appt. Sent in 30 day supply.

## 2013-12-11 ENCOUNTER — Encounter: Payer: Self-pay | Admitting: Physician Assistant

## 2014-01-09 ENCOUNTER — Other Ambulatory Visit: Payer: Self-pay

## 2014-01-09 MED ORDER — POTASSIUM CHLORIDE CRYS ER 20 MEQ PO TBCR: 20.0000 meq | EXTENDED_RELEASE_TABLET | Freq: Every day | ORAL | Status: DC

## 2014-01-25 ENCOUNTER — Other Ambulatory Visit: Payer: Self-pay

## 2014-01-25 MED ORDER — SIMVASTATIN 40 MG PO TABS: 40.0000 mg | ORAL_TABLET | Freq: Every evening | ORAL | Status: DC

## 2014-02-11 ENCOUNTER — Other Ambulatory Visit: Payer: Self-pay | Admitting: *Deleted

## 2014-02-11 MED ORDER — SPIRONOLACTONE 25 MG PO TABS: 25.0000 mg | ORAL_TABLET | Freq: Every day | ORAL | Status: DC

## 2014-02-20 ENCOUNTER — Telehealth: Payer: Self-pay | Admitting: Cardiology

## 2014-02-20 ENCOUNTER — Ambulatory Visit (INDEPENDENT_AMBULATORY_CARE_PROVIDER_SITE_OTHER): Payer: BC Managed Care – PPO | Admitting: *Deleted

## 2014-02-20 DIAGNOSIS — I428 Other cardiomyopathies: Secondary | ICD-10-CM

## 2014-02-20 DIAGNOSIS — I5022 Chronic systolic (congestive) heart failure: Secondary | ICD-10-CM

## 2014-02-20 NOTE — Telephone Encounter (Signed)
LMOVM reminding pt to send remote transmission.   

## 2014-02-21 ENCOUNTER — Encounter: Payer: Self-pay | Admitting: Internal Medicine

## 2014-02-21 DIAGNOSIS — I429 Cardiomyopathy, unspecified: Secondary | ICD-10-CM

## 2014-02-21 DIAGNOSIS — I5022 Chronic systolic (congestive) heart failure: Secondary | ICD-10-CM

## 2014-02-21 NOTE — Progress Notes (Signed)
Remote ICD transmission.   

## 2014-02-22 LAB — MDC_IDC_ENUM_SESS_TYPE_REMOTE
Battery Remaining Percentage: 89 %
Battery Voltage: 3.08 V
Brady Statistic AP VP Percent: 5.3 %
Brady Statistic AP VS Percent: 1 %
Brady Statistic AS VP Percent: 93 %
Brady Statistic AS VS Percent: 1.3 %
Brady Statistic RA Percent Paced: 5.1 %
Date Time Interrogation Session: 20151022054334
HighPow Impedance: 68 Ohm
HighPow Impedance: 68 Ohm
Implantable Pulse Generator Serial Number: 7157119
Lead Channel Impedance Value: 360 Ohm
Lead Channel Pacing Threshold Amplitude: 0.625 V
Lead Channel Pacing Threshold Amplitude: 0.75 V
Lead Channel Pacing Threshold Pulse Width: 0.5 ms
Lead Channel Pacing Threshold Pulse Width: 0.5 ms
Lead Channel Sensing Intrinsic Amplitude: 3.9 mV
Lead Channel Sensing Intrinsic Amplitude: 6.7 mV
Lead Channel Setting Pacing Amplitude: 2 V
Lead Channel Setting Pacing Pulse Width: 0.5 ms
Lead Channel Setting Sensing Sensitivity: 0.5 mV
MDC IDC MSMT BATTERY REMAINING LONGEVITY: 78 mo
MDC IDC MSMT LEADCHNL LV IMPEDANCE VALUE: 800 Ohm
MDC IDC MSMT LEADCHNL LV PACING THRESHOLD PULSEWIDTH: 0.5 ms
MDC IDC MSMT LEADCHNL RA PACING THRESHOLD AMPLITUDE: 0.5 V
MDC IDC MSMT LEADCHNL RV IMPEDANCE VALUE: 560 Ohm
MDC IDC SET LEADCHNL LV PACING AMPLITUDE: 2 V
MDC IDC SET LEADCHNL LV PACING PULSEWIDTH: 0.5 ms
MDC IDC SET LEADCHNL RA PACING AMPLITUDE: 2 V
MDC IDC SET ZONE DETECTION INTERVAL: 250 ms
Zone Setting Detection Interval: 300 ms

## 2014-03-06 ENCOUNTER — Encounter: Payer: Self-pay | Admitting: Cardiology

## 2014-03-22 ENCOUNTER — Encounter: Payer: Self-pay | Admitting: Cardiology

## 2014-03-25 ENCOUNTER — Ambulatory Visit (INDEPENDENT_AMBULATORY_CARE_PROVIDER_SITE_OTHER): Payer: BC Managed Care – PPO | Admitting: Physician Assistant

## 2014-03-25 VITALS — BP 144/88 | HR 75 | Temp 98.2°F | Resp 18 | Ht 72.0 in | Wt 308.4 lb

## 2014-03-25 DIAGNOSIS — G4733 Obstructive sleep apnea (adult) (pediatric): Secondary | ICD-10-CM

## 2014-03-25 DIAGNOSIS — R312 Other microscopic hematuria: Secondary | ICD-10-CM

## 2014-03-25 DIAGNOSIS — Z1211 Encounter for screening for malignant neoplasm of colon: Secondary | ICD-10-CM

## 2014-03-25 DIAGNOSIS — E785 Hyperlipidemia, unspecified: Secondary | ICD-10-CM

## 2014-03-25 DIAGNOSIS — E559 Vitamin D deficiency, unspecified: Secondary | ICD-10-CM

## 2014-03-25 DIAGNOSIS — J069 Acute upper respiratory infection, unspecified: Secondary | ICD-10-CM

## 2014-03-25 DIAGNOSIS — R3129 Other microscopic hematuria: Secondary | ICD-10-CM

## 2014-03-25 DIAGNOSIS — I10 Essential (primary) hypertension: Secondary | ICD-10-CM

## 2014-03-25 DIAGNOSIS — B353 Tinea pedis: Secondary | ICD-10-CM

## 2014-03-25 DIAGNOSIS — B9789 Other viral agents as the cause of diseases classified elsewhere: Secondary | ICD-10-CM

## 2014-03-25 DIAGNOSIS — I5022 Chronic systolic (congestive) heart failure: Secondary | ICD-10-CM

## 2014-03-25 LAB — COMPREHENSIVE METABOLIC PANEL
ALK PHOS: 76 U/L (ref 39–117)
ALT: 14 U/L (ref 0–53)
AST: 16 U/L (ref 0–37)
Albumin: 4 g/dL (ref 3.5–5.2)
BILIRUBIN TOTAL: 0.5 mg/dL (ref 0.2–1.2)
BUN: 11 mg/dL (ref 6–23)
CO2: 25 mEq/L (ref 19–32)
Calcium: 9.3 mg/dL (ref 8.4–10.5)
Chloride: 106 mEq/L (ref 96–112)
Creat: 0.9 mg/dL (ref 0.50–1.35)
Glucose, Bld: 98 mg/dL (ref 70–99)
Potassium: 4.9 mEq/L (ref 3.5–5.3)
SODIUM: 139 meq/L (ref 135–145)
TOTAL PROTEIN: 6.9 g/dL (ref 6.0–8.3)

## 2014-03-25 LAB — POCT CBC
Granulocyte percent: 76.9 %G (ref 37–80)
HCT, POC: 43.8 % (ref 43.5–53.7)
Hemoglobin: 14.2 g/dL (ref 14.1–18.1)
LYMPH, POC: 2.3 (ref 0.6–3.4)
MCH, POC: 29.3 pg (ref 27–31.2)
MCHC: 32.5 g/dL (ref 31.8–35.4)
MCV: 90.4 fL (ref 80–97)
MID (CBC): 0.2 (ref 0–0.9)
MPV: 7.2 fL (ref 0–99.8)
PLATELET COUNT, POC: 222 10*3/uL (ref 142–424)
POC GRANULOCYTE: 8.2 — AB (ref 2–6.9)
POC LYMPH %: 21.3 % (ref 10–50)
POC MID %: 1.8 %M (ref 0–12)
RBC: 4.84 M/uL (ref 4.69–6.13)
RDW, POC: 14.3 %
WBC: 10.6 10*3/uL — AB (ref 4.6–10.2)

## 2014-03-25 LAB — LIPID PANEL
Cholesterol: 161 mg/dL (ref 0–200)
HDL: 31 mg/dL — AB (ref >39)
LDL CALC: 109 mg/dL — AB (ref 0–99)
TRIGLYCERIDES: 104 mg/dL (ref <150)
Total CHOL/HDL Ratio: 5.2 Ratio
VLDL: 21 mg/dL (ref 0–40)

## 2014-03-25 MED ORDER — SPIRONOLACTONE 25 MG PO TABS: 25.0000 mg | ORAL_TABLET | Freq: Every day | ORAL | Status: DC

## 2014-03-25 MED ORDER — IPRATROPIUM BROMIDE 0.03 % NA SOLN: 2.0000 | Freq: Two times a day (BID) | NASAL | Status: DC

## 2014-03-25 MED ORDER — BENZONATATE 100 MG PO CAPS: 100.0000 mg | ORAL_CAPSULE | Freq: Three times a day (TID) | ORAL | Status: DC | PRN

## 2014-03-25 MED ORDER — FUROSEMIDE 40 MG PO TABS: 40.0000 mg | ORAL_TABLET | Freq: Every day | ORAL | Status: DC

## 2014-03-25 MED ORDER — LISINOPRIL 10 MG PO TABS: 15.0000 mg | ORAL_TABLET | Freq: Two times a day (BID) | ORAL | Status: DC

## 2014-03-25 NOTE — Patient Instructions (Signed)
I will contact you with your lab results as soon as they are available.   If you have not heard from me in 2 weeks, please contact me.  The fastest way to get your results is to register for My Chart (see the instructions on the last page of this printout).  I'll send cardiology a message about getting the echo scheduled. If you don't ehar from them in the next week, please contact me.  Use OTC Lamisil (I recommend the spray) each day when you get out of the bath/shower for the athlete's foot.

## 2014-03-25 NOTE — Progress Notes (Signed)
Left message for patient to call back to schedule physical with Harrison Mons, PA-C before 09/01/14.

## 2014-03-25 NOTE — Progress Notes (Signed)
Subjective:    Patient ID: Gary Williamson, male    DOB: March 10, 1966, 48 y.o.   MRN: [id number]   PCP: Mikey Maffett, PA-C  Chief Complaint  Patient presents with  . Cough    green sputum x 2 days  . Medication Refill    furosemide; lisinopril;spironolactone    No Known Allergies  Patient Active Problem List   Diagnosis Date Noted  . Hematuria, microscopic 05/15/2013  . NICM (nonischemic cardiomyopathy) 04/09/2013  . Severe obesity (BMI >= 40) 04/03/2013  . OSA (obstructive sleep apnea) 01/18/2013  . Ulnar collateral ligament sprain 04/13/2012  . Hypertension   . CARDIOVASCULAR FUNCTION STUDY, ABNORMAL 11/04/2009  . PURE HYPERCHOLESTEROLEMIA 10/14/2009  . Chronic systolic heart failure 75/17/0017  . Cardiac enlargement 05/02/2009  . Avitaminosis D 05/02/2009  . HLD (hyperlipidemia) 03/05/2009    Prior to Admission medications   Medication Sig Start Date End Date Taking? Authorizing Provider  aspirin 81 MG EC tablet Take 1 tablet (81 mg total) by mouth daily. 12/22/10  Yes Larey Dresser, MD  carvedilol (COREG) 25 MG tablet Take 1 tablet (25 mg total) by mouth 2 (two) times daily. 08/03/13  Yes Larey Dresser, MD  fish oil-omega-3 fatty acids 1000 MG capsule Take 1 capsule (1 g total) by mouth daily. 12/22/10  Yes Larey Dresser, MD  furosemide (LASIX) 40 MG tablet Take 1 tablet (40 mg total) by mouth daily. 07/30/13  Yes Cambridge Deleo S Jadarrius Maselli, PA-C  lisinopril (PRINIVIL,ZESTRIL) 10 MG tablet Take 15 mg by mouth 2 (two) times daily. 01/18/13  Yes Everitt Wenner S Jason Frisbee, PA-C  Multiple Vitamin (MULTIVITAMIN) tablet Take 1 tablet by mouth daily.     Yes Historical Provider, MD  potassium chloride SA (K-DUR,KLOR-CON) 20 MEQ tablet Take 1 tablet (20 mEq total) by mouth daily. PATIENT DUE FOR CHECK UP 01/09/14  Yes Ayannah Faddis S Jahmez Bily, PA-C  simvastatin (ZOCOR) 40 MG tablet Take 1 tablet (40 mg total) by mouth every evening. 01/25/14 01/25/15 Yes Larey Dresser, MD  spironolactone (ALDACTONE) 25  MG tablet Take 1 tablet (25 mg total) by mouth daily. 02/11/14  Yes Larey Dresser, MD    Medical, Surgical, Family and Social History reviewed and updated.  HPI  A week ago, was giving out turkeys during the night. Since then he's developed "a cold." Some nasal congestion and drainage, cough. Cough is worse over the past couple of days. No fever, chills.   Sees a dentist Q6 months (last visit a few months ago), eye specialist annually (last visit summer 2015).  Is current with influenza vaccine. Is current with pneumococcal vaccine.   Review of Systems Denies chest pain, shortness of breath, HA, dizziness, vision change, nausea, vomiting, diarrhea, constipation, melena, hematochezia, dysuria, increased urinary urgency or frequency, increased hunger or thirst, unintentional weight change, unexplained myalgias or arthralgias, rash.     Objective:   Physical Exam  Constitutional: He is oriented to person, place, and time. Vital signs are normal. He appears well-developed and well-nourished. He is active and cooperative. No distress.  BP 144/88 mmHg  Pulse 75  Temp(Src) 98.2 F (36.8 C) (Oral)  Resp 18  Ht 6' (1.829 m)  Wt 308 lb 6.4 oz (139.889 kg)  BMI 41.82 kg/m2  SpO2 97%  HENT:  Head: Normocephalic and atraumatic.  Right Ear: Hearing normal.  Left Ear: Hearing normal.  Eyes: Conjunctivae are normal. No scleral icterus.  Neck: Normal range of motion. Neck supple. No thyromegaly present.  Cardiovascular: Normal rate, regular rhythm  and normal heart sounds.   Pulses:      Radial pulses are 2+ on the right side, and 2+ on the left side.  Pulmonary/Chest: Effort normal and breath sounds normal.  Lymphadenopathy:       Head (right side): No tonsillar, no preauricular, no posterior auricular and no occipital adenopathy present.       Head (left side): No tonsillar, no preauricular, no posterior auricular and no occipital adenopathy present.    He has no cervical adenopathy.         Right: No supraclavicular adenopathy present.       Left: No supraclavicular adenopathy present.  Neurological: He is alert and oriented to person, place, and time. No sensory deficit.  Skin: Skin is warm, dry and intact. Rash noted. Rash is maculopapular (between the toes of the RIGHT foot). No cyanosis or erythema. Nails show no clubbing.  Psychiatric: He has a normal mood and affect.  Vitals reviewed.         Assessment & Plan:  1. Essential hypertension Mildly elevated today. Continue current treatment. - POCT CBC - Comprehensive metabolic panel - lisinopril (PRINIVIL,ZESTRIL) 10 MG tablet; Take 1.5 tablets (15 mg total) by mouth 2 (two) times daily.  Dispense: 135 tablet; Refill: 3  2. HLD (hyperlipidemia) Await lab results. - Lipid panel  3. Hematuria, microscopic Urology evaluation was normal. Continue to monitor annually.  4. Chronic systolic heart failure Stable. Continue follow-up with cardiology. - furosemide (LASIX) 40 MG tablet; Take 1 tablet (40 mg total) by mouth daily.  Dispense: 90 tablet; Refill: 3 - spironolactone (ALDACTONE) 25 MG tablet; Take 1 tablet (25 mg total) by mouth daily.  Dispense: 90 tablet; Refill: 3  5. OSA (obstructive sleep apnea) Continue CPAP. WEight loss will help, possibly eliminate.  6. Severe obesity (BMI >= 40) Healthy eating, regular exercise.  7. Avitaminosis D Await lab results. - Vitamin D 1,25 dihydroxy  8. Screening for colon cancer - Ambulatory referral to Gastroenterology  9. Tinea pedis of right foot OTC lamisil.  10. Viral URI with cough Supportive care. - benzonatate (TESSALON) 100 MG capsule; Take 1-2 capsules (100-200 mg total) by mouth 3 (three) times daily as needed for cough.  Dispense: 40 capsule; Refill: 0 - ipratropium (ATROVENT) 0.03 % nasal spray; Place 2 sprays into both nostrils 2 (two) times daily.  Dispense: 30 mL; Refill: 0  Return for Complete physical before 09/01/2014.  Fara Chute, PA-C Physician Assistant-Certified [facility]

## 2014-03-28 LAB — VITAMIN D 1,25 DIHYDROXY
Vitamin D 1, 25 (OH)2 Total: 55 pg/mL (ref 18–72)
Vitamin D2 1, 25 (OH)2: <8 pg/mL
Vitamin D3 1, 25 (OH)2: 55 pg/mL

## 2014-04-01 ENCOUNTER — Other Ambulatory Visit: Payer: Self-pay | Admitting: *Deleted

## 2014-04-01 DIAGNOSIS — I428 Other cardiomyopathies: Secondary | ICD-10-CM

## 2014-04-08 NOTE — Progress Notes (Signed)
Left message for patient to CB to schedule a CPE with Chelle before May 2016.

## 2014-04-11 ENCOUNTER — Encounter (HOSPITAL_COMMUNITY): Payer: Self-pay | Admitting: Internal Medicine

## 2014-04-23 ENCOUNTER — Other Ambulatory Visit (HOSPITAL_COMMUNITY): Payer: Self-pay | Admitting: Cardiology

## 2014-04-24 ENCOUNTER — Encounter: Payer: Self-pay | Admitting: Internal Medicine

## 2014-04-30 ENCOUNTER — Ambulatory Visit (HOSPITAL_BASED_OUTPATIENT_CLINIC_OR_DEPARTMENT_OTHER)
Admission: RE | Admit: 2014-04-30 | Discharge: 2014-04-30 | Disposition: A | Discharge status: Home / Self Care | Payer: BC Managed Care – PPO | Source: Ambulatory Visit | Attending: Cardiology | Admitting: Cardiology

## 2014-04-30 ENCOUNTER — Ambulatory Visit (HOSPITAL_COMMUNITY)
Admission: RE | Admit: 2014-04-30 | Discharge: 2014-04-30 | Disposition: A | Discharge status: Home / Self Care | Payer: BC Managed Care – PPO | Source: Ambulatory Visit | Attending: Physician Assistant | Admitting: Physician Assistant

## 2014-04-30 VITALS — BP 122/80 | HR 84 | Wt 309.0 lb

## 2014-04-30 DIAGNOSIS — I429 Cardiomyopathy, unspecified: Secondary | ICD-10-CM | POA: Insufficient documentation

## 2014-04-30 DIAGNOSIS — I428 Other cardiomyopathies: Secondary | ICD-10-CM

## 2014-04-30 DIAGNOSIS — Z7982 Long term (current) use of aspirin: Secondary | ICD-10-CM | POA: Insufficient documentation

## 2014-04-30 DIAGNOSIS — E669 Obesity, unspecified: Secondary | ICD-10-CM | POA: Diagnosis not present

## 2014-04-30 DIAGNOSIS — E785 Hyperlipidemia, unspecified: Secondary | ICD-10-CM | POA: Insufficient documentation

## 2014-04-30 DIAGNOSIS — I517 Cardiomegaly: Secondary | ICD-10-CM

## 2014-04-30 DIAGNOSIS — I509 Heart failure, unspecified: Secondary | ICD-10-CM | POA: Insufficient documentation

## 2014-04-30 DIAGNOSIS — I5022 Chronic systolic (congestive) heart failure: Secondary | ICD-10-CM

## 2014-04-30 DIAGNOSIS — I340 Nonrheumatic mitral (valve) insufficiency: Secondary | ICD-10-CM | POA: Insufficient documentation

## 2014-04-30 DIAGNOSIS — I447 Left bundle-branch block, unspecified: Secondary | ICD-10-CM | POA: Diagnosis not present

## 2014-04-30 DIAGNOSIS — I10 Essential (primary) hypertension: Secondary | ICD-10-CM | POA: Diagnosis not present

## 2014-04-30 DIAGNOSIS — Z79899 Other long term (current) drug therapy: Secondary | ICD-10-CM | POA: Insufficient documentation

## 2014-04-30 DIAGNOSIS — Z95810 Presence of automatic (implantable) cardiac defibrillator: Secondary | ICD-10-CM | POA: Insufficient documentation

## 2014-04-30 DIAGNOSIS — I313 Pericardial effusion (noninflammatory): Secondary | ICD-10-CM | POA: Insufficient documentation

## 2014-04-30 MED ORDER — SACUBITRIL-VALSARTAN 24-26 MG PO TABS: 1.0000 | ORAL_TABLET | Freq: Two times a day (BID) | ORAL | Status: DC

## 2014-04-30 NOTE — Progress Notes (Signed)
  Echocardiogram 2D Echocardiogram has been performed.  Darlina Sicilian M 04/30/2014, 2:37 PM

## 2014-04-30 NOTE — Progress Notes (Signed)
Patient ID: Ulis Kaps, male   DOB: 21-Feb-1966, 48 y.o.   MRN: [id number] PCP: Harrison Mons  48 yo with a h/o nonischemic cardiomyopathy, LBBB, HTN, and NYHA Class II CHF presents for cardiology followup.  He has been doing well recently.  Echo in 12/14 showed that his EF remains 20-25%.  He had a St Jude CRT-D system placed in 4/15.    He returns for follow up . Denies SOB/PND/Orthopnea. No ICDs.  Weight at home 308 pounds. Not exercising. Following low salt and limiting fluid intake to < less than 2 liters per day. Echo today was reviewed.  EF 25-30%, mild LV dilation, normal RV size and systolic function.    Labs (8/11): K 4.5, creatinine 1.3, LDL 79, HDL 26 Labs (2/12): K 4.3, creatinine 1.5 Labs (7/12): K 4.5, creatinine 1.2 Labs (12/14): K 4.4, creatinine 1.2, LDL 87, HDL 32 Labs (4/15): K 4.2, creatinine 1.03 Labs (03/25/14) : K 4.9 Creatinine 0.9 Cholesterol 161 TGL 104 HDL 31 LDL 109   Allergies (verified):  No Known Drug Allergies  Past Medical History: 1. Cardiomyopathy: Nonischemic.  He had a left heart cath in 2002 or 2003 with no significant coronary disease per his report (this was done in [place]). It sounds like his CMP has been present since about 2000.  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  Echo (5/11): severe LV dilation with EF < 20%, diffuse hypokinesis, moderate diastolic dysfunction, severe left atrial enlargement.  First hospitalization for CHF was in 5/11.  No drug use.  Adenosine myoview (6/11): EF 21%, diffuse hypokinesis, scar in the anterior and inferior walls and at the apex.  Mild peri-infarct ischemia.  LHC was done (7/11) showing EF 15%, global hypokinesis, LVEDP 22 mmHg, no angiographic CAD.  Echo (10/11): EF 20-25%, severely dilated LV with diffuse hypokinesis, mild MR.  Echo (12/14) with EF 20-25%, moderately dilated LV, diffuse hypokinesis, cannot rule out apical thrombus.  He was unable to tolerate cardiac MRI.  He  had St Jude CRT-D implantation in 4/15. Echo (12/15) with EF 25-30%, mild LV dilation, mild LVH, normal RV size and systolic function.  2. Hypertension 3. Chronic left bundle branch block 4. Hyperlipidemia  Family History: Family history is negative for premature diagnosis of coronary artery disease or any arrhythmia or history of heart failure in first-degree relatives.  Mother and father do have a history of hypertension  Social History: The patient lives in [place] with his wife and 2 children.   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   Current Outpatient Prescriptions  Medication Sig Dispense Refill  . aspirin 81 MG EC tablet Take 1 tablet (81 mg total) by mouth daily. 30 tablet 2  . carvedilol (COREG) 25 MG tablet Take 1 tablet (25 mg total) by mouth 2 (two) times daily. 180 tablet 2  . fish oil-omega-3 fatty acids 1000 MG capsule Take 1 capsule (1 g total) by mouth daily. 30 capsule 2  . furosemide (LASIX) 40 MG tablet Take 1 tablet (40 mg total) by mouth daily. 90 tablet 3  . lisinopril (PRINIVIL,ZESTRIL) 10 MG tablet Take 1.5 tablets (15 mg total) by mouth 2 (two) times daily. 135 tablet 3  .  Multiple Vitamin (MULTIVITAMIN) tablet Take 1 tablet by mouth daily.      . potassium chloride SA (K-DUR,KLOR-CON) 20 MEQ tablet Take 1 tablet (20 mEq total) by mouth daily. PATIENT DUE FOR CHECK UP 90 tablet 0  . simvastatin (ZOCOR) 40 MG tablet Take 1 tablet (40 mg total) by mouth every evening. 90 tablet 1  . spironolactone (ALDACTONE) 25 MG tablet Take 1 tablet (25 mg total) by mouth daily. 90 tablet 3  . ipratropium (ATROVENT) 0.03 % nasal spray Place 2 sprays into both nostrils 2 (two) times daily. (Patient not taking: Reported on 04/30/2014) 30 mL 0   No  current facility-administered medications for this encounter.    BP 122/80 mmHg  Pulse 84  Wt 309 lb (140.161 kg)  SpO2 98% General:  Well developed, well nourished, in no acute distress.  Obese.  Neck:  Neck supple, no JVD. No masses, thyromegaly or abnormal cervical nodes. Lungs:  Clear bilaterally to auscultation and percussion. Heart:  Non-displaced PMI, chest non-tender; regular rate and rhythm, S1, S2 without murmurs, rubs or gallops. S2 is paradoxically split.  Carotid upstroke normal, no bruit.  Pedals normal pulses. No edema, no varicosities. Abdomen:  Bowel sounds positive; abdomen soft and non-tender without masses, organomegaly, or hernias noted. No hepatosplenomegaly. Extremities:  No clubbing or cyanosis. Neurologic:  Alert and oriented x 3. Psych:  Normal affect.  Assessment/Plan: 1. Nonischemic cardiomyopathy: NYHA class II symptoms, stable.  He now has a Research officer, political party CRT-D system. Euvolemic on exam.  Echo today with stable EF 25-30%.  - Continue lasix 40 mg daily.  - On goal dose of coreg .  - Stop lisinopril and start Entresto 24/26 bid.  Followup in 2 weeks in office for BMET.  Will try to titrate up Entresto at that time.  - Continue spironolactone 25 mg daily - He does not tolerate nitrates so no Bidil.  - He has an unusual QRS axis for BiV pacing.  I had his device checked today and his LV lead is working properly.   2. Obesity: Patient needs to lose weight. He is going to try weight watchers.   Follow up in CHF clinic in 4 months   CLEGG,AMY 10/01/2013   Patient seen with NP, agree with the above note.  Stable NYHA class II symptoms.  Euvolemic.  As above, will stop lisinopril and begin Entresto.  [name] 05/01/2014

## 2014-04-30 NOTE — Patient Instructions (Signed)
STOP Lisinopril.  START Entresto on Thursday 1 tablet twice daily.  Follow up in 2 weeks with lab work.  Do the following things EVERYDAY: 1) Weigh yourself in the morning before breakfast. Write it down and keep it in a log. 2) Take your medicines as prescribed 3) Eat low salt foods-Limit salt (sodium) to 2000 mg per day.  4) Stay as active as you can everyday 5) Limit all fluids for the day to less than 2 liters

## 2014-05-15 ENCOUNTER — Encounter (HOSPITAL_COMMUNITY): Payer: Self-pay

## 2014-05-15 ENCOUNTER — Ambulatory Visit (HOSPITAL_COMMUNITY)
Admission: RE | Admit: 2014-05-15 | Discharge: 2014-05-15 | Disposition: A | Discharge status: Home / Self Care | Payer: BC Managed Care – PPO | Source: Ambulatory Visit | Attending: Cardiology | Admitting: Cardiology

## 2014-05-15 DIAGNOSIS — I5022 Chronic systolic (congestive) heart failure: Secondary | ICD-10-CM | POA: Diagnosis not present

## 2014-05-15 DIAGNOSIS — E785 Hyperlipidemia, unspecified: Secondary | ICD-10-CM | POA: Insufficient documentation

## 2014-05-15 DIAGNOSIS — I428 Other cardiomyopathies: Secondary | ICD-10-CM | POA: Diagnosis not present

## 2014-05-15 DIAGNOSIS — E669 Obesity, unspecified: Secondary | ICD-10-CM | POA: Insufficient documentation

## 2014-05-15 DIAGNOSIS — I10 Essential (primary) hypertension: Secondary | ICD-10-CM | POA: Diagnosis not present

## 2014-05-15 LAB — BASIC METABOLIC PANEL
ANION GAP: 6 (ref 5–15)
BUN: 11 mg/dL (ref 6–23)
CALCIUM: 9 mg/dL (ref 8.4–10.5)
CHLORIDE: 102 meq/L (ref 96–112)
CO2: 27 mmol/L (ref 19–32)
Creatinine, Ser: 1.21 mg/dL (ref 0.50–1.35)
GFR calc non Af Amer: 68 mL/min — ABNORMAL LOW (ref >90)
GFR, EST AFRICAN AMERICAN: 79 mL/min — AB (ref >90)
GLUCOSE: 120 mg/dL — AB (ref 70–99)
POTASSIUM: 4 mmol/L (ref 3.5–5.1)
Sodium: 135 mmol/L (ref 135–145)

## 2014-05-15 MED ORDER — SACUBITRIL-VALSARTAN 49-51 MG PO TABS
1.0000 | ORAL_TABLET | Freq: Two times a day (BID) | ORAL | Status: DC
Start: 2014-05-15 — End: 2014-09-09

## 2014-05-15 NOTE — Patient Instructions (Signed)
STOP Potassium.  INCREASE Entresto to 49/51mg  tablet twice daily. Can double current tablets until you get new prescription.  Return in 2 weeks for repeat lab work.  Follow up in 6 weeks.  Do the following things EVERYDAY: 1) Weigh yourself in the morning before breakfast. Write it down and keep it in a log. 2) Take your medicines as prescribed 3) Eat low salt foods-Limit salt (sodium) to 2000 mg per day.  4) Stay as active as you can everyday 5) Limit all fluids for the day to less than 2 liters

## 2014-05-15 NOTE — Progress Notes (Signed)
Patient ID: Gary Williamson, male   DOB: Dec 22, 1965, 49 y.o.   MRN: [id number] PCP: Harrison Mons  49 yo with a h/o nonischemic cardiomyopathy, LBBB, HTN, and NYHA Class II CHF presents for cardiology followup.  He has been doing well recently.  Echo in 12/14 showed that his EF remains 20-25%.  He had a [employer] Jude CRT-D system placed in 4/15.    He returns for follow up . Last visit lisinopril was stopped and he started entresto 29/31 mg twice a day. Denies SOB/PND/Orthopnea. No shocks.  Weight at home 308-310 pounds. Walking at gym about 20 minutes. Following low salt and limiting fluid intake to < less than 2 liters per day. Echo today was reviewed.  EF 25-30%, mild LV dilation, normal RV size and systolic function.    Labs (8/11): K 4.5, creatinine 1.3, LDL 79, HDL 26 Labs (2/12): K 4.3, creatinine 1.5 Labs (7/12): K 4.5, creatinine 1.2 Labs (12/14): K 4.4, creatinine 1.2, LDL 87, HDL 32 Labs (4/15): K 4.2, creatinine 1.03 Labs (03/25/14) : K 4.9 Creatinine 0.9 Cholesterol 161 TGL 104 HDL 31 LDL 109   Allergies (verified):  No Known Drug Allergies  Past Medical History: 1. Cardiomyopathy: Nonischemic.  He had a left heart cath in 2002 or 2003 with no significant coronary disease per his report (this was done in [place]). It sounds like his CMP has been present since about 2000.  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  Echo (5/11): severe LV dilation with EF < 20%, diffuse hypokinesis, moderate diastolic dysfunction, severe left atrial enlargement.  First hospitalization for CHF was in 5/11.  No drug use.  Adenosine myoview (6/11): EF 21%, diffuse hypokinesis, scar in the anterior and inferior walls and at the apex.  Mild peri-infarct ischemia.  LHC was done (7/11) showing EF 15%, global hypokinesis, LVEDP 22 mmHg, no angiographic CAD.  Echo (10/11): EF 20-25%, severely dilated LV with diffuse hypokinesis, mild MR.  Echo (12/14) with EF 20-25%, moderately  dilated LV, diffuse hypokinesis, cannot rule out apical thrombus.  He was unable to tolerate cardiac MRI.  He had St Jude CRT-D implantation in 4/15. Echo (12/15) with EF 25-30%, mild LV dilation, mild LVH, normal RV size and systolic function.  2. Hypertension 3. Chronic left bundle branch block 4. Hyperlipidemia  Family History: Family history is negative for premature diagnosis of coronary artery disease or any arrhythmia or history of heart failure in first-degree relatives.  Mother and father do have a history of hypertension  Social History: The patient lives in [place] with his wife and 2 children.   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   Current Outpatient Prescriptions  Medication Sig Dispense Refill  . aspirin 81 MG EC tablet Take 1 tablet (81 mg total) by mouth daily. 30 tablet 2  . carvedilol (COREG) 25 MG tablet Take 1 tablet (25 mg total) by mouth 2 (two) times daily. 180 tablet 2  . fish oil-omega-3 fatty acids 1000 MG capsule Take 1 capsule (1 g total) by mouth daily. 30 capsule 2  . furosemide (LASIX) 40 MG tablet Take 1 tablet (40 mg total) by mouth daily. 90 tablet 3  . Multiple Vitamin (MULTIVITAMIN)  tablet Take 1 tablet by mouth daily.      . potassium chloride SA (K-DUR,KLOR-CON) 20 MEQ tablet Take 1 tablet (20 mEq total) by mouth daily. PATIENT DUE FOR CHECK UP 90 tablet 0  . sacubitril-valsartan (ENTRESTO) 24-26 MG Take 1 tablet by mouth 2 (two) times daily. 28 tablet 0  . simvastatin (ZOCOR) 40 MG tablet Take 1 tablet (40 mg total) by mouth every evening. 90 tablet 1  . spironolactone (ALDACTONE) 25 MG tablet Take 1 tablet (25 mg total) by mouth daily. 90 tablet 3   No current facility-administered medications for this encounter.    BP  120/85 mmHg  Pulse 82  Resp 18  Wt 314 lb (142.429 kg)  SpO2 98% General:  Well developed, well nourished, in no acute distress.  Obese.  Neck:  Neck supple, no JVD. No masses, thyromegaly or abnormal cervical nodes. Lungs:  Clear bilaterally to auscultation and percussion. Heart:  Non-displaced PMI, chest non-tender; regular rate and rhythm, S1, S2 without murmurs, rubs or gallops. S2 is paradoxically split.  Carotid upstroke normal, no bruit.  Pedals normal pulses. No edema, no varicosities. Abdomen:  Bowel sounds positive; abdomen soft and non-tender without masses, organomegaly, or hernias noted. No hepatosplenomegaly. Extremities:  No clubbing or cyanosis. Neurologic:  Alert and oriented x 3. Psych:  Normal affect.  Assessment/Plan: 1. Nonischemic cardiomyopathy: Doing well. NYHA class II symptoms, stable.  He now has a [employer] CRT-D system. Euvolemic on exam.  05/01/15 ECHO EF 25-30%.  - Continue lasix 40 mg daily. Stop potassium  - On goal dose of coreg 25 mg twice a day - Increase Entresto to 49/51 mg twice a day. Check BMET today and in 10 days. - Continue spironolactone 25 mg daily - He does not tolerate nitrates so no Bidil.  Check BMET.  2. Obesity: Patient needs to lose weight. He is going to try weight watchers.  Encouraged to continue exercising.   Follow up in CHF clinic in 6 weeks    Janis Sol NP-C  8:43 AM

## 2014-05-16 ENCOUNTER — Other Ambulatory Visit: Payer: Self-pay

## 2014-05-16 DIAGNOSIS — I5022 Chronic systolic (congestive) heart failure: Secondary | ICD-10-CM

## 2014-05-16 MED ORDER — SPIRONOLACTONE 25 MG PO TABS
25.0000 mg | ORAL_TABLET | Freq: Every day | ORAL | Status: DC
Start: 2014-05-16 — End: 2014-05-20

## 2014-05-16 MED ORDER — CARVEDILOL 25 MG PO TABS: 25.0000 mg | ORAL_TABLET | Freq: Two times a day (BID) | ORAL | Status: DC

## 2014-05-16 MED ORDER — FUROSEMIDE 40 MG PO TABS: 40.0000 mg | ORAL_TABLET | Freq: Every day | ORAL | Status: DC

## 2014-05-20 ENCOUNTER — Other Ambulatory Visit: Payer: Self-pay | Admitting: *Deleted

## 2014-05-20 DIAGNOSIS — I5022 Chronic systolic (congestive) heart failure: Secondary | ICD-10-CM

## 2014-05-20 MED ORDER — SPIRONOLACTONE 25 MG PO TABS: 25.0000 mg | ORAL_TABLET | Freq: Every day | ORAL | Status: DC

## 2014-05-20 MED ORDER — CARVEDILOL 25 MG PO TABS: 25.0000 mg | ORAL_TABLET | Freq: Two times a day (BID) | ORAL | Status: DC

## 2014-05-27 ENCOUNTER — Encounter: Payer: Self-pay | Admitting: Internal Medicine

## 2014-05-27 ENCOUNTER — Ambulatory Visit (INDEPENDENT_AMBULATORY_CARE_PROVIDER_SITE_OTHER): Payer: BC Managed Care – PPO | Admitting: *Deleted

## 2014-05-27 DIAGNOSIS — I428 Other cardiomyopathies: Secondary | ICD-10-CM

## 2014-05-27 DIAGNOSIS — I5022 Chronic systolic (congestive) heart failure: Secondary | ICD-10-CM

## 2014-05-27 DIAGNOSIS — I429 Cardiomyopathy, unspecified: Secondary | ICD-10-CM

## 2014-05-27 NOTE — Progress Notes (Signed)
Remote ICD transmission.   

## 2014-05-29 ENCOUNTER — Ambulatory Visit (HOSPITAL_COMMUNITY)
Admission: RE | Admit: 2014-05-29 | Discharge: 2014-05-29 | Disposition: A | Discharge status: Home / Self Care | Payer: BC Managed Care – PPO | Source: Ambulatory Visit | Attending: Cardiology | Admitting: Cardiology

## 2014-05-29 DIAGNOSIS — I5022 Chronic systolic (congestive) heart failure: Secondary | ICD-10-CM | POA: Insufficient documentation

## 2014-05-29 LAB — BASIC METABOLIC PANEL
Anion gap: 9 (ref 5–15)
BUN: 13 mg/dL (ref 6–23)
CO2: 24 mmol/L (ref 19–32)
CREATININE: 1.2 mg/dL (ref 0.50–1.35)
Calcium: 8.6 mg/dL (ref 8.4–10.5)
Chloride: 105 mmol/L (ref 96–112)
GFR, EST AFRICAN AMERICAN: 80 mL/min — AB (ref >90)
GFR, EST NON AFRICAN AMERICAN: 69 mL/min — AB (ref >90)
GLUCOSE: 153 mg/dL — AB (ref 70–99)
Potassium: 3.8 mmol/L (ref 3.5–5.1)
Sodium: 138 mmol/L (ref 135–145)

## 2014-05-30 ENCOUNTER — Telehealth: Payer: Self-pay | Admitting: *Deleted

## 2014-05-30 LAB — MDC_IDC_ENUM_SESS_TYPE_REMOTE
Battery Remaining Percentage: 85 %
Brady Statistic AP VS Percent: 1 %
Brady Statistic AS VP Percent: 96 %
Brady Statistic AS VS Percent: 1.2 %
Brady Statistic RA Percent Paced: 2.8 %
Date Time Interrogation Session: 20160125082237
HIGH POWER IMPEDANCE MEASURED VALUE: 64 Ohm
HighPow Impedance: 64 Ohm
Implantable Pulse Generator Serial Number: 7157119
Lead Channel Impedance Value: 590 Ohm
Lead Channel Pacing Threshold Amplitude: 0.5 V
Lead Channel Pacing Threshold Amplitude: 0.625 V
Lead Channel Pacing Threshold Amplitude: 0.75 V
Lead Channel Pacing Threshold Pulse Width: 0.5 ms
Lead Channel Pacing Threshold Pulse Width: 0.5 ms
Lead Channel Sensing Intrinsic Amplitude: 6.8 mV
Lead Channel Setting Pacing Amplitude: 2 V
Lead Channel Setting Pacing Amplitude: 2 V
Lead Channel Setting Pacing Pulse Width: 0.5 ms
Lead Channel Setting Pacing Pulse Width: 0.5 ms
Lead Channel Setting Sensing Sensitivity: 0.5 mV
MDC IDC MSMT BATTERY REMAINING LONGEVITY: 76 mo
MDC IDC MSMT BATTERY VOLTAGE: 3.04 V
MDC IDC MSMT LEADCHNL LV IMPEDANCE VALUE: 890 Ohm
MDC IDC MSMT LEADCHNL LV PACING THRESHOLD PULSEWIDTH: 0.5 ms
MDC IDC MSMT LEADCHNL RA IMPEDANCE VALUE: 400 Ohm
MDC IDC MSMT LEADCHNL RA SENSING INTR AMPL: 4 mV
MDC IDC SET LEADCHNL LV PACING AMPLITUDE: 2 V
MDC IDC SET ZONE DETECTION INTERVAL: 250 ms
MDC IDC SET ZONE DETECTION INTERVAL: 300 ms
MDC IDC STAT BRADY AP VP PERCENT: 3 %

## 2014-05-30 NOTE — Telephone Encounter (Signed)
Dr Henrene Pastor, Reviewing this pt's chart for Pv for 2-1 Monday. He has a colon scheduled for 06-17-14, Monday with you. He has a history of chronic systolic heart failure, HTN, OSA, cardiac enlargement with ICD placement. His last echo was 04-30-2014 showing an EF of 25-30%. His EF has been as low as 15%. Do you want an OV for him or is a direct colon ok? Please advise. Thanks for your time,  Marijean Niemann.

## 2014-05-30 NOTE — Telephone Encounter (Signed)
LMTRC on home and cell number 05-30-14 305 pm. Lelan Pons PV

## 2014-05-30 NOTE — Telephone Encounter (Signed)
He needs an office visit.  Thanks.

## 2014-05-31 NOTE — Telephone Encounter (Signed)
115pm 1-29 [facility]  Marie PV

## 2014-05-31 NOTE — Telephone Encounter (Signed)
LM on cell number. Pt has called and RS his PV to 2-4 Thursday but we need to cancel and schedule Ov per Dr Henrene Pastor due to medical history. Pt needs colon done in hospital setting not [facility].  Lelan Pons PV

## 2014-05-31 NOTE — Telephone Encounter (Signed)
0847 am [facility] on home number to canc PV and Schedule OV. ewm

## 2014-06-03 NOTE — Telephone Encounter (Signed)
Patient returned call today. Office visit needed as new patient with Dr. Henrene Pastor per Dr. Henrene Pastor. Appointment made for July 24, 2014 845 am.

## 2014-06-05 ENCOUNTER — Encounter: Payer: Self-pay | Admitting: Cardiology

## 2014-06-17 ENCOUNTER — Encounter: Payer: BC Managed Care – PPO | Admitting: Internal Medicine

## 2014-07-24 ENCOUNTER — Ambulatory Visit (INDEPENDENT_AMBULATORY_CARE_PROVIDER_SITE_OTHER): Payer: BLUE CROSS/BLUE SHIELD | Admitting: Internal Medicine

## 2014-07-24 ENCOUNTER — Encounter: Payer: Self-pay | Admitting: Internal Medicine

## 2014-07-24 ENCOUNTER — Ambulatory Visit: Payer: BC Managed Care – PPO | Admitting: Internal Medicine

## 2014-07-24 VITALS — BP 106/66 | HR 80 | Ht 72.0 in | Wt 313.5 lb

## 2014-07-24 DIAGNOSIS — I5042 Chronic combined systolic (congestive) and diastolic (congestive) heart failure: Secondary | ICD-10-CM

## 2014-07-24 DIAGNOSIS — G4733 Obstructive sleep apnea (adult) (pediatric): Secondary | ICD-10-CM | POA: Diagnosis not present

## 2014-07-24 DIAGNOSIS — Z1211 Encounter for screening for malignant neoplasm of colon: Secondary | ICD-10-CM

## 2014-07-24 MED ORDER — MOVIPREP 100 G PO SOLR: 1.0000 | Freq: Once | ORAL | Status: DC

## 2014-07-24 NOTE — Patient Instructions (Signed)
You have been scheduled for a colonoscopy at [facility].  Please follow written instructions given to you at your visit today.  Please pick up your prep supplies at the pharmacy within the next 1-3 days. If you use inhalers (even only as needed), please bring them with you on the day of your procedure.

## 2014-07-24 NOTE — Progress Notes (Signed)
HISTORY OF PRESENT ILLNESS:  Gary Williamson is a 49 y.o. male with hypertension, nonischemic cardiomyopathy with estimated ejection fraction 30-35%, morbid obesity, and obstructive sleep apnea. He is sent to the office today after having had screening colonoscopy requested by his primary provider. Due to his comorbidities, face-to-face consultation required. The patient has not had prior colonoscopy. He denies a family history of colon cancer. He is not on blood thinners. He has no lower GI complaints such as change in bowel habits, bleeding, or abdominal pain. No weight loss. His heart failure has been stable.  REVIEW OF SYSTEMS:  All non-GI ROS negative upon complete and comprehensive review  Past Medical History  Diagnosis Date  . NICM (nonischemic cardiomyopathy)     Probably nonischemic; LHC in 2002/03 no sig CAD per report (done in [place]). Cardiomyopathy for 9-10 yrs. 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 Echo 5/11: severe LVE, EF <20%, diff HK, mod dias dys, sev. LAE. Hosp for CHF in 5/11. Ad. MV 6/11: EF 21%, mild per-infart ischemia. LHC (7/11): EF 15%, no CAD, LVEDP 22; Echo (10/11): EF 20-25%;  Echo (12/12):  EF 20%, mod LVH, Gr 1 DD, diff HK, mild LAE  . Hypertension   . LBBB (left bundle branch block)     Chronic  . Pure hypercholesterolemia   . Chronic systolic heart failure   . 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 Severe obesity   . OSA (obstructive sleep apnea)   . Tinea pedis     Past Surgical History  Procedure Laterality Date  . Cardiac catheterization  2002 or 2003  . Left heart cath  11/13/09    Dr [name]  . Bi-ventricular implantable cardioverter defibrillator N/A 08/21/2013    Procedure: BI-VENTRICULAR IMPLANTABLE CARDIOVERTER DEFIBRILLATOR  (CRT-D);  Surgeon: Coralyn Mark, MD;  Location: [facility] CATH LAB;  Service: Cardiovascular;  Laterality: N/A;    Social History Daniel Haxton  reports that he has never  smoked. He has never used smokeless tobacco. He reports that he drinks alcohol. He reports that he does not use illicit drugs.  family history includes Alzheimer's disease in his mother; Arthritis in his father; Breast cancer in his mother; COPD in his brother; Dementia in his father; Hyperlipidemia in his mother; Hypertension in his father and mother; Stroke in his maternal grandmother. There is no history of Coronary artery disease, Heart failure, or Heart disease.  No Known Allergies     PHYSICAL EXAMINATION: Vital signs: BP 106/66 mmHg  Pulse 80  Ht 6' (1.829 m)  Wt 313 lb 8 oz (142.203 kg)  BMI 42.51 kg/m2  Constitutional: Obese, generally well-appearing, no acute distress Psychiatric: alert and oriented x3, cooperative Eyes: extraocular movements intact, anicteric, conjunctiva pink Mouth: oral pharynx moist, no lesions Neck: supple no lymphadenopathy Cardiovascular: heart regular rate and rhythm, no murmur Lungs: clear to auscultation bilaterally Abdomen: soft, obese, nontender, nondistended, no obvious ascites, no peritoneal signs, normal bowel sounds, no organomegaly Rectal: Deferred until colonoscopy Extremities: no lower extremity edema bilaterally Skin: no lesions on visible extremities Neuro: No focal deficits.   ASSESSMENT:  #1. Colon cancer screening. Baseline risk for colorectal neoplasia. However, high risk for invasive procedure due to comorbidities. I discussed with him in detail the nature of colonoscopy (optical) as well as the risks, benefits, and alternatives. In terms of alternatives, I specifically discussed with him cologard in the data on cologard use. He was interested in optical colonoscopy. This will be  performed at the hospital with MAC/propofol. We will use electrolyte neutral prep solution due to his history of heart failure.  A copy has been sent to Doroteo Bradford, PA-C

## 2014-08-27 ENCOUNTER — Ambulatory Visit (INDEPENDENT_AMBULATORY_CARE_PROVIDER_SITE_OTHER): Payer: BLUE CROSS/BLUE SHIELD | Admitting: *Deleted

## 2014-08-27 ENCOUNTER — Encounter: Payer: Self-pay | Admitting: Internal Medicine

## 2014-08-27 DIAGNOSIS — I5022 Chronic systolic (congestive) heart failure: Secondary | ICD-10-CM

## 2014-08-27 DIAGNOSIS — I429 Cardiomyopathy, unspecified: Secondary | ICD-10-CM

## 2014-08-27 DIAGNOSIS — I428 Other cardiomyopathies: Secondary | ICD-10-CM

## 2014-08-27 NOTE — Progress Notes (Signed)
Remote ICD transmission.   

## 2014-08-28 ENCOUNTER — Ambulatory Visit (INDEPENDENT_AMBULATORY_CARE_PROVIDER_SITE_OTHER): Payer: BLUE CROSS/BLUE SHIELD | Admitting: Family Medicine

## 2014-08-28 ENCOUNTER — Encounter: Payer: Self-pay | Admitting: Internal Medicine

## 2014-08-28 ENCOUNTER — Encounter: Payer: Self-pay | Admitting: Family Medicine

## 2014-08-28 VITALS — BP 97/64 | HR 71 | Temp 97.9°F | Resp 16 | Ht 73.0 in | Wt 310.0 lb

## 2014-08-28 DIAGNOSIS — E7800 Pure hypercholesterolemia, unspecified: Secondary | ICD-10-CM

## 2014-08-28 DIAGNOSIS — Z125 Encounter for screening for malignant neoplasm of prostate: Secondary | ICD-10-CM | POA: Diagnosis not present

## 2014-08-28 DIAGNOSIS — R312 Other microscopic hematuria: Secondary | ICD-10-CM | POA: Diagnosis not present

## 2014-08-28 DIAGNOSIS — I429 Cardiomyopathy, unspecified: Secondary | ICD-10-CM

## 2014-08-28 DIAGNOSIS — G4733 Obstructive sleep apnea (adult) (pediatric): Secondary | ICD-10-CM

## 2014-08-28 DIAGNOSIS — E559 Vitamin D deficiency, unspecified: Secondary | ICD-10-CM

## 2014-08-28 DIAGNOSIS — R3129 Other microscopic hematuria: Secondary | ICD-10-CM

## 2014-08-28 DIAGNOSIS — Z131 Encounter for screening for diabetes mellitus: Secondary | ICD-10-CM | POA: Diagnosis not present

## 2014-08-28 DIAGNOSIS — I10 Essential (primary) hypertension: Secondary | ICD-10-CM

## 2014-08-28 DIAGNOSIS — Z0000 Encounter for general adult medical examination without abnormal findings: Secondary | ICD-10-CM

## 2014-08-28 DIAGNOSIS — I428 Other cardiomyopathies: Secondary | ICD-10-CM

## 2014-08-28 DIAGNOSIS — I5022 Chronic systolic (congestive) heart failure: Secondary | ICD-10-CM

## 2014-08-28 LAB — MDC_IDC_ENUM_SESS_TYPE_REMOTE
Battery Remaining Longevity: 73 mo
Battery Remaining Percentage: 82 %
Battery Voltage: 3.01 V
Brady Statistic AP VP Percent: 3.1 %
Brady Statistic AP VS Percent: 1 %
Brady Statistic AS VP Percent: 95 %
Brady Statistic AS VS Percent: 1.2 %
Brady Statistic RA Percent Paced: 2.9 %
Date Time Interrogation Session: 20160426065422
HighPow Impedance: 69 Ohm
HighPow Impedance: 69 Ohm
Implantable Pulse Generator Serial Number: 7157119
Lead Channel Impedance Value: 430 Ohm
Lead Channel Impedance Value: 530 Ohm
Lead Channel Impedance Value: 940 Ohm
Lead Channel Pacing Threshold Amplitude: 0.5 V
Lead Channel Pacing Threshold Amplitude: 0.625 V
Lead Channel Pacing Threshold Amplitude: 0.75 V
Lead Channel Pacing Threshold Pulse Width: 0.5 ms
Lead Channel Pacing Threshold Pulse Width: 0.5 ms
Lead Channel Pacing Threshold Pulse Width: 0.5 ms
Lead Channel Sensing Intrinsic Amplitude: 4.3 mV
Lead Channel Sensing Intrinsic Amplitude: 8.5 mV
Lead Channel Setting Pacing Amplitude: 2 V
Lead Channel Setting Pacing Amplitude: 2 V
Lead Channel Setting Pacing Amplitude: 2 V
Lead Channel Setting Pacing Pulse Width: 0.5 ms
Lead Channel Setting Pacing Pulse Width: 0.5 ms
Lead Channel Setting Sensing Sensitivity: 0.5 mV
Zone Setting Detection Interval: 250 ms
Zone Setting Detection Interval: 300 ms

## 2014-08-28 LAB — POCT URINALYSIS DIPSTICK
Bilirubin, UA: NEGATIVE
Glucose, UA: NEGATIVE
Ketones, UA: NEGATIVE
Leukocytes, UA: NEGATIVE
NITRITE UA: NEGATIVE
PROTEIN UA: NEGATIVE
RBC UA: NEGATIVE
SPEC GRAV UA: 1.01
UROBILINOGEN UA: 0.2
pH, UA: 6

## 2014-08-28 LAB — CBC WITH DIFFERENTIAL/PLATELET
Basophils Absolute: 0 10*3/uL (ref 0.0–0.1)
Basophils Relative: 0 % (ref 0–1)
Eosinophils Absolute: 0.2 10*3/uL (ref 0.0–0.7)
Eosinophils Relative: 2 % (ref 0–5)
HCT: 42.2 % (ref 39.0–52.0)
Hemoglobin: 14.8 g/dL (ref 13.0–17.0)
LYMPHS PCT: 21 % (ref 12–46)
Lymphs Abs: 2.1 10*3/uL (ref 0.7–4.0)
MCH: 30.3 pg (ref 26.0–34.0)
MCHC: 35.1 g/dL (ref 30.0–36.0)
MCV: 86.5 fL (ref 78.0–100.0)
MPV: 10.3 fL (ref 8.6–12.4)
Monocytes Absolute: 0.9 10*3/uL (ref 0.1–1.0)
Monocytes Relative: 9 % (ref 3–12)
NEUTROS PCT: 68 % (ref 43–77)
Neutro Abs: 6.7 10*3/uL (ref 1.7–7.7)
PLATELETS: 220 10*3/uL (ref 150–400)
RBC: 4.88 MIL/uL (ref 4.22–5.81)
RDW: 14.5 % (ref 11.5–15.5)
WBC: 9.8 10*3/uL (ref 4.0–10.5)

## 2014-08-28 LAB — LIPID PANEL
CHOL/HDL RATIO: 5.3 ratio
CHOLESTEROL: 158 mg/dL (ref 0–200)
HDL: 30 mg/dL — AB (ref >=40)
LDL Cholesterol: 101 mg/dL — ABNORMAL HIGH (ref 0–99)
TRIGLYCERIDES: 133 mg/dL (ref <150)
VLDL: 27 mg/dL (ref 0–40)

## 2014-08-28 LAB — COMPREHENSIVE METABOLIC PANEL
ALBUMIN: 4.2 g/dL (ref 3.5–5.2)
ALK PHOS: 70 U/L (ref 39–117)
ALT: 21 U/L (ref 0–53)
AST: 19 U/L (ref 0–37)
BUN: 14 mg/dL (ref 6–23)
CO2: 26 mEq/L (ref 19–32)
Calcium: 9.2 mg/dL (ref 8.4–10.5)
Chloride: 107 mEq/L (ref 96–112)
Creat: 1.12 mg/dL (ref 0.50–1.35)
Glucose, Bld: 111 mg/dL — ABNORMAL HIGH (ref 70–99)
POTASSIUM: 4.4 meq/L (ref 3.5–5.3)
SODIUM: 146 meq/L — AB (ref 135–145)
TOTAL PROTEIN: 7 g/dL (ref 6.0–8.3)
Total Bilirubin: 0.4 mg/dL (ref 0.2–1.2)

## 2014-08-28 LAB — HEMOGLOBIN A1C
Hgb A1c MFr Bld: 6.1 % — ABNORMAL HIGH (ref <5.7)
Mean Plasma Glucose: 128 mg/dL — ABNORMAL HIGH (ref <117)

## 2014-08-28 LAB — TSH: TSH: 1.871 u[IU]/mL (ref 0.350–4.500)

## 2014-08-28 NOTE — Patient Instructions (Addendum)
1. Check blood pressure once weekly; call cardiology if blood pressure consistently less than 100/60.  2.  Recommend trying to wear CPAP nightly. 3.  Work on exercise and weight loss.   Keeping you healthy  Get these tests  Blood pressure- Have your blood pressure checked once a year by your healthcare provider.  Normal blood pressure is 120/80  Weight- Have your body mass index (BMI) calculated to screen for obesity.  BMI is a measure of body fat based on height and weight. You can also calculate your own BMI at View[employer].si.  Cholesterol- Have your cholesterol checked every year.  Diabetes- Have your blood sugar checked regularly if you have high blood pressure, high cholesterol, have a family history of diabetes or if you are overweight.  Screening for Colon Cancer- Colonoscopy starting at age 11.  Screening may begin sooner depending on your family history and other health conditions. Follow up colonoscopy as directed by your Gastroenterologist.  Screening for Prostate Cancer- Both blood work (PSA) and a rectal exam help screen for Prostate Cancer.  Screening begins at age 29 with African-American men and at age 30 with Caucasian men.  Screening may begin sooner depending on your family history.  Take these medicines  Aspirin- One aspirin daily can help prevent Heart disease and Stroke.  Flu shot- Every fall.  Tetanus- Every 10 years.  Zostavax- Once after the age of 68 to prevent Shingles.  Pneumonia shot- Once after the age of 32; if you are younger than 67, ask your healthcare provider if you need a Pneumonia shot.  Take these steps  Don't smoke- If you do smoke, talk to your doctor about quitting.  For tips on how to quit, go to www.smokefree.gov or call 1-800-QUIT-NOW.  Be physically active- Exercise 5 days a week for at least 30 minutes.  If you are not already physically active start slow and gradually work up to 30 minutes of moderate physical activity.   Examples of moderate activity include walking briskly, mowing the yard, dancing, swimming, bicycling, etc.  Eat a healthy diet- Eat a variety of healthy food such as fruits, vegetables, low fat milk, low fat cheese, yogurt, lean meant, poultry, fish, beans, tofu, etc. For more information go to www.thenutritionsource.org  Drink alcohol in moderation- Limit alcohol intake to less than two drinks a day. Never drink and drive.  Dentist- Brush and floss twice daily; visit your dentist twice a year.  Depression- Your emotional health is as important as your physical health. If you're feeling down, or losing interest in things you would normally enjoy please talk to your healthcare provider.  Eye exam- Visit your eye doctor every year.  Safe sex- If you may be exposed to a sexually transmitted infection, use a condom.  Seat belts- Seat belts can save your life; always wear one.  Smoke/Carbon Monoxide detectors- These detectors need to be installed on the appropriate level of your home.  Replace batteries at least once a year.  Skin cancer- When out in the sun, cover up and use sunscreen 15 SPF or higher.  Violence- If anyone is threatening you, please tell your healthcare provider.  Living Will/ Health care power of attorney- Speak with your healthcare provider and family.

## 2014-08-28 NOTE — Progress Notes (Signed)
Subjective:    Patient ID: Gary Williamson, male    DOB: 1965-11-07, 49 y.o.   MRN: [id number]  08/28/2014  Annual Exam   HPI This 49 y.o. male presents for Complete Physical Examination.  Last physical:  04-03-2013 Colonoscopy:  Consultation with GI 07-24-2014; scheduled for colon cancer screening 09-2014.   TDAP: 2013 Pneumovax:  2014 Influenza:  01-2014 Eye exam:  Glasses 2015; no glaucoma or cataracts. Dental exam:  Every six months.  CHF systolic/cardiomyopathy: followed every six months; recent switch in medication.  Defibrillator; performs testing from home.  Recent switch from Lisinopril to Entresto 49-51 three months ago.  No dizziness.    Does not weigh daily; baseline weight is 315.    OSA: does not wear CPAP; machine keeps wife awake; also makes sounds.   Hypercholesterolemia: Patient reports good compliance with medication, good tolerance to medication, and good symptom control.      Review of Systems  Constitutional: Negative for fever, chills, diaphoresis, activity change, appetite change, fatigue and unexpected weight change.  HENT: Negative for congestion, dental problem, drooling, ear discharge, ear pain, facial swelling, hearing loss, mouth sores, nosebleeds, postnasal drip, rhinorrhea, sinus pressure, sneezing, sore throat, tinnitus, trouble swallowing and voice change.   Eyes: Negative for photophobia, pain, discharge, redness, itching and visual disturbance.  Respiratory: Negative for apnea, cough, choking, chest tightness, shortness of breath, wheezing and stridor.   Cardiovascular: Negative for chest pain, palpitations and leg swelling.  Gastrointestinal: Negative for nausea, vomiting, abdominal pain, diarrhea, constipation and blood in stool.  Endocrine: Negative for cold intolerance, heat intolerance, polydipsia, polyphagia and polyuria.  Genitourinary: Negative for dysuria, urgency, frequency, hematuria, flank pain, decreased urine volume, discharge,  penile swelling, scrotal swelling, enuresis, difficulty urinating, genital sores, penile pain and testicular pain.  Musculoskeletal: Negative for myalgias, back pain, joint swelling, arthralgias, gait problem, neck pain and neck stiffness.  Skin: Negative for color change, pallor, rash and wound.  Allergic/Immunologic: Negative for environmental allergies, food allergies and immunocompromised state.  Neurological: Negative for dizziness, tremors, seizures, syncope, facial asymmetry, speech difficulty, weakness, light-headedness, numbness and headaches.  Hematological: Negative for adenopathy. Does not bruise/bleed easily.  Psychiatric/Behavioral: Negative for suicidal ideas, hallucinations, behavioral problems, confusion, sleep disturbance, self-injury, dysphoric mood, decreased concentration and agitation. The patient is not nervous/anxious and is not hyperactive.     Past Medical History  Diagnosis Date  . NICM (nonischemic cardiomyopathy)     Probably nonischemic; LHC in 2002/03 no sig CAD per report (done in [place]). Cardiomyopathy for 9-10 yrs. 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 Echo 5/11: severe LVE, EF <20%, diff HK, mod dias dys, sev. LAE. Hosp for CHF in 5/11. Ad. MV 6/11: EF 21%, mild per-infart ischemia. LHC (7/11): EF 15%, no CAD, LVEDP 22; Echo (10/11): EF 20-25%;  Echo (12/12):  EF 20%, mod LVH, Gr 1 DD, diff HK, mild LAE  . Hypertension   . LBBB (left bundle branch block)     Chronic  . Pure hypercholesterolemia   . Chronic systolic heart failure   . 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 Severe obesity   . OSA (obstructive sleep apnea)    Past Surgical History  Procedure Laterality Date  . Cardiac catheterization  2002 or 2003  . Left heart cath  11/13/09    Dr [name]  . Bi-ventricular implantable cardioverter defibrillator N/A 08/21/2013    Procedure: BI-VENTRICULAR IMPLANTABLE CARDIOVERTER DEFIBRILLATOR  (CRT-D);   Surgeon: Nelda Severe  [name], MD;  Location: [facility];  Service: Cardiovascular;  Laterality: N/A;   No Known Allergies Current Outpatient Prescriptions  Medication Sig Dispense Refill  . aspirin 81 MG EC tablet Take 1 tablet (81 mg total) by mouth daily. 30 tablet 2  . carvedilol (COREG) 25 MG tablet Take 1 tablet (25 mg total) by mouth 2 (two) times daily. 180 tablet 3  . fish oil-omega-3 fatty acids 1000 MG capsule Take 1 capsule (1 g total) by mouth daily. 30 capsule 2  . furosemide (LASIX) 40 MG tablet Take 1 tablet (40 mg total) by mouth daily. 90 tablet 2  . Multiple Vitamin (MULTIVITAMIN) tablet Take 1 tablet by mouth daily.      . sacubitril-valsartan (ENTRESTO) 49-51 MG Take 1 tablet by mouth 2 (two) times daily. 60 tablet 3  . simvastatin (ZOCOR) 40 MG tablet Take 1 tablet (40 mg total) by mouth every evening. 90 tablet 1  . spironolactone (ALDACTONE) 25 MG tablet Take 1 tablet (25 mg total) by mouth daily. 90 tablet 3   No current facility-administered medications for this visit.   History   Social History  . Marital Status: Married    Spouse Name: [name]  . Number of Children: 2  . Years of Education: N/A   Occupational History  . [employer] of [employer], applications for [employer]  .     Social History Main Topics  . Smoking status: Never Smoker   . Smokeless tobacco: Never Used  . Alcohol Use: 0.0 - 1.0 oz/week    0-2 Standard drinks or equivalent per week  . Drug Use: No  . Sexual Activity: Yes    Birth Control/ Protection: Condom   Other Topics Concern  . Not on file   Social History Narrative   Marital status: married x 15 years; happily married      Children: 2 children (12,9)      Lives in [place] with his wife and 2 children      Employment: [employer]; Solicitor x 9 years; likes work.      Tobacco: none      Alcohol:  1 drink per week   Regular diet      Exercise:  Walking three days per week; 20 minutes.       Seatbelt: 100%       Family History  Problem Relation Age of Onset  . Hypertension Mother   . Alzheimer's disease Mother   . Hyperlipidemia Mother   . Breast cancer Mother   . Cancer Mother     breast cancer  . Coronary artery disease Neg Hx   . Heart failure Neg Hx   . Heart disease Neg Hx   . Stroke Maternal Grandmother   . Arthritis Father   . Dementia Father   . Hypertension Father   . Alzheimer's disease Father   . COPD Brother        Objective:    BP 97/64 mmHg  Pulse 71  Temp(Src) 97.9 F (36.6 C)  Resp 16  Ht 6\' 1"  (1.854 m)  Wt 310 lb (140.615 kg)  BMI 40.91 kg/m2  SpO2 98% Physical Exam  Constitutional: He is oriented to person, place, and time. He appears well-developed and well-nourished. No distress.  obese  HENT:  Head: Normocephalic and atraumatic.  Right Ear: External ear normal.  Left Ear: External ear normal.  Nose: Nose normal.  Mouth/Throat: Oropharynx is clear and moist.  Eyes: Conjunctivae and EOM  are normal. Pupils are equal, round, and reactive to light.  Neck: Normal range of motion. Neck supple. Carotid bruit is not present. No thyromegaly present.  Cardiovascular: Normal rate, regular rhythm, normal heart sounds and intact distal pulses.  Exam reveals no gallop and no friction rub.   No murmur heard. Pulmonary/Chest: Effort normal and breath sounds normal. He has no wheezes. He has no rales.  Abdominal: Soft. Bowel sounds are normal. He exhibits no distension and no mass. There is no tenderness. There is no rebound and no guarding. Hernia confirmed negative in the right inguinal area and confirmed negative in the left inguinal area.  Genitourinary: Rectum normal, testes normal and penis normal. Prostate is enlarged. Prostate is not tender. Right testis shows no mass, no swelling and no tenderness. Left testis shows no mass, no swelling and no tenderness. Circumcised.  Musculoskeletal:       Right shoulder: Normal.       Left shoulder: Normal.       Cervical  back: Normal.  Lymphadenopathy:    He has no cervical adenopathy.       Right: No inguinal adenopathy present.       Left: No inguinal adenopathy present.  Neurological: He is alert and oriented to person, place, and time. He has normal reflexes. No cranial nerve deficit. He exhibits normal muscle tone. Coordination normal.  Skin: Skin is warm and dry. No rash noted. He is not diaphoretic.  Psychiatric: He has a normal mood and affect. His behavior is normal. Judgment and thought content normal.        Assessment & Plan:   1. Routine physical examination   2. Severe obesity (BMI >= 40)   3. Pure hypercholesterolemia   4. OSA (obstructive sleep apnea)   5. NICM (nonischemic cardiomyopathy)   6. Essential hypertension   7. Hematuria, microscopic   8. Chronic systolic heart failure   9. Avitaminosis D   10. Screening for diabetes mellitus   11. Screening for prostate cancer      1. Complete Physical Examination: anticipatory guidance--- weight loss, exercise.  Scheduled for colonoscopy in 09/2014.  Immunizations UTD.   2.  Obesity: persistent; highly recommend weight loss, exercise. 3.  Hypercholesterolemia: controlled; obtain labs; continue current medication. 4.  OSA: non-compliance with CPAP; highly encouraged increased compliance; recommend contacting health agency regarding machine and mask. 5.  Non-ischemic CM: followed by cardiology every six months.   6.  HTN: controlled and borderline low readings today; recommend checking BP weekly for next month; call cardiology if BP consistently < 100/60.  7.  Hematuria microscopic: stable; s/p urological work up in the past. 8.  Chronic systolic heart failure: stable; with defibrillator; euvolemic today.  Maintained on Coreg, Lasix, Spironolactone, and Entresto.  9.  Vitamin D deficiency: stable; obtain labs. 10.  Screening diabetes: obtain glucose, HgbA1c. 11. Screening prostate cancer: s/p DRE; obtain PSA.   Meds ordered this  encounter  Medications  . simvastatin (ZOCOR) 40 MG tablet    Sig: Take 1 tablet (40 mg total) by mouth every evening.    Dispense:  90 tablet    Refill:  1    Return in about 6 months (around 02/27/2015) for recheck with Harrison Mons, PA-C.    Norwood Levo, M.D. [facility] [address] [place], [place]  [address] [phone] phone [phone] fax

## 2014-08-29 LAB — PSA: PSA: 0.56 ng/mL (ref <=4.00)

## 2014-08-29 LAB — VITAMIN D 25 HYDROXY (VIT D DEFICIENCY, FRACTURES): Vit D, 25-Hydroxy: 18 ng/mL — ABNORMAL LOW (ref 30–100)

## 2014-08-29 MED ORDER — SIMVASTATIN 40 MG PO TABS: 40.0000 mg | ORAL_TABLET | Freq: Every evening | ORAL | Status: DC

## 2014-09-03 ENCOUNTER — Encounter: Payer: Self-pay | Admitting: Cardiology

## 2014-09-03 DIAGNOSIS — Z1211 Encounter for screening for malignant neoplasm of colon: Secondary | ICD-10-CM

## 2014-09-06 ENCOUNTER — Encounter (HOSPITAL_COMMUNITY): Payer: Self-pay | Admitting: *Deleted

## 2014-09-09 ENCOUNTER — Other Ambulatory Visit (HOSPITAL_COMMUNITY): Payer: Self-pay | Admitting: *Deleted

## 2014-09-09 MED ORDER — SACUBITRIL-VALSARTAN 49-51 MG PO TABS: 1.0000 | ORAL_TABLET | Freq: Two times a day (BID) | ORAL | Status: DC

## 2014-09-10 ENCOUNTER — Telehealth: Payer: Self-pay

## 2014-09-10 NOTE — Telephone Encounter (Signed)
Pt called back inquiring about labs. Let him know what they said and that I had sent him a copy in the mail

## 2014-09-16 ENCOUNTER — Ambulatory Visit (HOSPITAL_COMMUNITY): Payer: BLUE CROSS/BLUE SHIELD | Admitting: Anesthesiology

## 2014-09-16 ENCOUNTER — Encounter (HOSPITAL_COMMUNITY)
Admission: RE | Disposition: A | Discharge status: Home / Self Care | Payer: Self-pay | Source: Ambulatory Visit | Attending: Internal Medicine

## 2014-09-16 ENCOUNTER — Ambulatory Visit (HOSPITAL_COMMUNITY)
Admission: RE | Admit: 2014-09-16 | Discharge: 2014-09-16 | Disposition: A | Discharge status: Home / Self Care | Payer: BLUE CROSS/BLUE SHIELD | Source: Ambulatory Visit | Attending: Internal Medicine | Admitting: Internal Medicine

## 2014-09-16 ENCOUNTER — Encounter (HOSPITAL_COMMUNITY): Payer: Self-pay

## 2014-09-16 DIAGNOSIS — G4733 Obstructive sleep apnea (adult) (pediatric): Secondary | ICD-10-CM | POA: Insufficient documentation

## 2014-09-16 DIAGNOSIS — Z1211 Encounter for screening for malignant neoplasm of colon: Secondary | ICD-10-CM | POA: Diagnosis not present

## 2014-09-16 DIAGNOSIS — E669 Obesity, unspecified: Secondary | ICD-10-CM | POA: Diagnosis not present

## 2014-09-16 DIAGNOSIS — Z9989 Dependence on other enabling machines and devices: Secondary | ICD-10-CM | POA: Insufficient documentation

## 2014-09-16 DIAGNOSIS — Z95810 Presence of automatic (implantable) cardiac defibrillator: Secondary | ICD-10-CM | POA: Insufficient documentation

## 2014-09-16 DIAGNOSIS — D120 Benign neoplasm of cecum: Secondary | ICD-10-CM | POA: Insufficient documentation

## 2014-09-16 DIAGNOSIS — Z79899 Other long term (current) drug therapy: Secondary | ICD-10-CM | POA: Insufficient documentation

## 2014-09-16 DIAGNOSIS — D125 Benign neoplasm of sigmoid colon: Secondary | ICD-10-CM | POA: Diagnosis not present

## 2014-09-16 DIAGNOSIS — Z0000 Encounter for general adult medical examination without abnormal findings: Secondary | ICD-10-CM | POA: Insufficient documentation

## 2014-09-16 DIAGNOSIS — K5730 Diverticulosis of large intestine without perforation or abscess without bleeding: Secondary | ICD-10-CM | POA: Insufficient documentation

## 2014-09-16 DIAGNOSIS — I10 Essential (primary) hypertension: Secondary | ICD-10-CM | POA: Insufficient documentation

## 2014-09-16 DIAGNOSIS — I5022 Chronic systolic (congestive) heart failure: Secondary | ICD-10-CM | POA: Insufficient documentation

## 2014-09-16 HISTORY — PX: COLONOSCOPY: SHX5424

## 2014-09-16 SURGERY — COLONOSCOPY
Anesthesia: Monitor Anesthesia Care

## 2014-09-16 MED ORDER — LACTATED RINGERS IV SOLN
INTRAVENOUS | Status: DC | PRN
  Administered 2014-09-16: 09:00:00 via INTRAVENOUS

## 2014-09-16 MED ORDER — PROPOFOL 10 MG/ML IV BOLUS
INTRAVENOUS | Status: AC
  Filled 2014-09-16: qty 20

## 2014-09-16 MED ORDER — PROPOFOL 10 MG/ML IV BOLUS
INTRAVENOUS | Status: DC | PRN
  Administered 2014-09-16: 50 mg via INTRAVENOUS
  Administered 2014-09-16: 100 mg via INTRAVENOUS
  Administered 2014-09-16 (×3): 50 mg via INTRAVENOUS

## 2014-09-16 MED ORDER — PROPOFOL 10 MG/ML IV BOLUS
INTRAVENOUS | Status: AC
Start: 2014-09-16 — End: 2014-09-16
  Filled 2014-09-16: qty 20

## 2014-09-16 MED ORDER — SODIUM CHLORIDE 0.9 % IV SOLN: INTRAVENOUS | Status: DC

## 2014-09-16 NOTE — H&P (Signed)
  HISTORY OF PRESENT ILLNESS:  Gary Williamson is a 49 y.o. male who was evaluated in the office March 2016 regarding screening colonoscopy. He is high-risk given history of heart failure. No interval clinical change. Last proceed with colonoscopy under monitored anesthesia care.  REVIEW OF SYSTEMS:  All non-GI ROS negative upon review  Past Medical History  Diagnosis Date  . NICM (nonischemic cardiomyopathy)     Probably nonischemic; LHC in 2002/03 no sig CAD per report (done in [place]). Cardiomyopathy for 9-10 yrs. 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 Echo 5/11: severe LVE, EF <20%, diff HK, mod dias dys, sev. LAE. Hosp for CHF in 5/11. Ad. MV 6/11: EF 21%, mild per-infart ischemia. LHC (7/11): EF 15%, no CAD, LVEDP 22; Echo (10/11): EF 20-25%;  Echo (12/12):  EF 20%, mod LVH, Gr 1 DD, diff HK, mild LAE  . Hypertension   . LBBB (left bundle branch block)     Chronic  . Pure hypercholesterolemia   . Chronic systolic heart failure   . 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 Severe obesity   . OSA (obstructive sleep apnea)     cpap used    Past Surgical History  Procedure Laterality Date  . Cardiac catheterization  2002 or 2003  . Left heart cath  11/13/09    Dr [name]  . Bi-ventricular implantable cardioverter defibrillator N/A 08/21/2013    Procedure: BI-VENTRICULAR IMPLANTABLE CARDIOVERTER DEFIBRILLATOR  (CRT-D);  Surgeon: Coralyn Mark, MD;  Location: [facility];  Service: Cardiovascular;  Laterality: N/A;    Social History Khari Lozon  reports that he has never smoked. He has never used smokeless tobacco. He reports that he drinks alcohol. He reports that he does not use illicit drugs.  family history includes Alzheimer's disease in his father and mother; Arthritis in his father; Breast cancer in his mother; COPD in his brother; Cancer in his mother; Dementia in his father; Hyperlipidemia in his mother; Hypertension in his  father and mother; Stroke in his maternal grandmother. There is no history of Coronary artery disease, Heart failure, or Heart disease.  No Known Allergies     PHYSICAL EXAMINATION: Vital signs: BP 146/78 mmHg  Pulse 69  Temp(Src) 98.1 F (36.7 C) (Oral)  Resp 22  Ht 6\' 1"  (1.854 m)  Wt 310 lb (140.615 kg)  BMI 40.91 kg/m2  SpO2 95% General: Well-developed, well-nourished, no acute distress HEENT: Sclerae are anicteric, conjunctiva pink. Oral mucosa intact Lungs: Clear Heart: Regular Abdomen: soft, nontender, nondistended, no obvious ascites, no peritoneal signs, normal bowel sounds. No organomegaly. Extremities: No edema Psychiatric: alert and oriented x3. Cooperative   ASSESSMENT:  #1. Screening colonoscopy. High risk #2. History of CHF. Stable  PLAN:  #1. Colonoscopy with MAC.The nature of the procedure, as well as the risks, benefits, and alternatives were carefully and thoroughly reviewed with the patient. Ample time for discussion and questions allowed. The patient understood, was satisfied, and agreed to proceed.

## 2014-09-16 NOTE — Anesthesia Postprocedure Evaluation (Signed)
Anesthesia Post Note  Patient: Gary Williamson  Procedure(s) Performed: Procedure(s) (LRB): COLONOSCOPY (N/A)  Anesthesia type: general  Patient location: PACU  Post pain: Pain level controlled  Post assessment: Patient's Cardiovascular Status Stable  Last Vitals:  Filed Vitals:   09/16/14 1050  BP: 118/76  Pulse: 71  Temp:   Resp: 19    Post vital signs: Reviewed and stable  Level of consciousness: sedated  Complications: No apparent anesthesia complications

## 2014-09-16 NOTE — Transfer of Care (Signed)
Immediate Anesthesia Transfer of Care Note  Patient: Gary Williamson  Procedure(s) Performed: Procedure(s): COLONOSCOPY (N/A)  Patient Location: PACU and Endoscopy Unit  Anesthesia Type:MAC  Level of Consciousness: awake, sedated and patient cooperative  Airway & Oxygen Therapy: Patient Spontanous Breathing and Patient connected to face mask oxygen  Post-op Assessment: Report given to RN and Post -op Vital signs reviewed and stable  Post vital signs: Reviewed and stable  Last Vitals:  Filed Vitals:   09/16/14 0845  BP: 146/78  Pulse: 69  Temp: 36.7 C  Resp: 22    Complications: No apparent anesthesia complications

## 2014-09-16 NOTE — Anesthesia Preprocedure Evaluation (Addendum)
Anesthesia Evaluation  Patient identified by MRN, date of birth, ID band Patient awake    Reviewed: Allergy & Precautions, NPO status , Patient's Chart, lab work & pertinent test results  Airway Mallampati: I  TM Distance: >3 FB Neck ROM: Full    Dental   Pulmonary sleep apnea ,    Pulmonary exam normal       Cardiovascular hypertension, Pt. on medications Normal cardiovascular exam+ Cardiac Defibrillator  ECHO 04/2014 Study Conclusions  - Left ventricle: The cavity size was mildly dilated. Wall thickness was increased in a pattern of mild LVH. Systolic function was severely reduced. The estimated ejection fraction was in the range of 25% to 30%. Diffuse hypokinesis. Doppler parameters are consistent with abnormal left ventricular relaxation (grade 1 diastolic dysfunction). - Aortic valve: There was no stenosis. - Mitral valve: There was trivial regurgitation. - Left atrium: The atrium was mildly dilated. - Right ventricle: The cavity size was normal. Pacer wire or catheter noted in right ventricle. Systolic function was normal. - Tricuspid valve: Peak RV-RA gradient (S): 21 mm Hg. - Pulmonary arteries: PA peak pressure: 29 mm Hg (S). - Inferior vena cava: The vessel was normal in size. The respirophasic diameter changes were in the normal range (>= 50%), consistent with normal central venous pressure. - Pericardium, extracardiac: A trivial pericardial effusion was identified.  Impressions:  - Normal LV size with mild LV hypertrophy. EF 25-30%, diffuse hypokinesis. Normal RV size and systolic function. No significant valvular abnormalities.    Neuro/Psych    GI/Hepatic   Endo/Other    Renal/GU      Musculoskeletal   Abdominal   Peds  Hematology   Anesthesia Other Findings   Reproductive/Obstetrics                            Anesthesia Physical Anesthesia  Plan  ASA: III  Anesthesia Plan: MAC   Post-op Pain Management:    Induction: Intravenous  Airway Management Planned: Natural Airway  Additional Equipment:   Intra-op Plan:   Post-operative Plan: Extubation in OR  Informed Consent: I have reviewed the patients History and Physical, chart, labs and discussed the procedure including the risks, benefits and alternatives for the proposed anesthesia with the patient or authorized representative who has indicated his/her understanding and acceptance.     Plan Discussed with: CRNA and Surgeon  Anesthesia Plan Comments:         Anesthesia Quick Evaluation

## 2014-09-16 NOTE — Discharge Instructions (Signed)
Colonoscopy, Care After °These instructions give you information on caring for yourself after your procedure. Your doctor may also give you more specific instructions. Call your doctor if you have any problems or questions after your procedure. °HOME CARE °· Do not drive for 24 hours. °· Do not sign important papers or use machinery for 24 hours. °· You may shower. °· You may go back to your usual activities, but go slower for the first 24 hours. °· Take rest breaks often during the first 24 hours. °· Walk around or use warm packs on your belly (abdomen) if you have belly cramping or gas. °· Drink enough fluids to keep your pee (urine) clear or pale yellow. °· Resume your normal diet. Avoid heavy or fried foods. °· Avoid drinking alcohol for 24 hours or as told by your doctor. °· Only take medicines as told by your doctor. °If a tissue sample (biopsy) was taken during the procedure:  °· Do not take aspirin or blood thinners for 7 days, or as told by your doctor. °· Do not drink alcohol for 7 days, or as told by your doctor. °· Eat soft foods for the first 24 hours. °GET HELP IF: °You still have a small amount of blood in your poop (stool) 2-3 days after the procedure. °GET HELP RIGHT AWAY IF: °· You have more than a small amount of blood in your poop. °· You see clumps of tissue (blood clots) in your poop. °· Your belly is puffy (swollen). °· You feel sick to your stomach (nauseous) or throw up (vomit). °· You have a fever. °· You have belly pain that gets worse and medicine does not help. °MAKE SURE YOU: °· Understand these instructions. °· Will watch your condition. °· Will get help right away if you are not doing well or get worse. °Document Released: 05/22/2010 Document Revised: 04/24/2013 Document Reviewed: 12/25/2012 °ExitCare® Patient Information ©2015 [employer]. This information is not intended to replace advice given to you by your health care provider. Make sure you discuss any questions you have with  your health care provider. ° °

## 2014-09-16 NOTE — Op Note (Signed)
[facility] [place], [address]   COLONOSCOPY PROCEDURE REPORT  PATIENT: Gary Williamson, Gary Williamson  MR#: [id number] BIRTHDATE: Sep 05, 1965 , 48  yrs. old GENDER: male ENDOSCOPIST: Eustace Quail, MD REFERRED YT:KPTWSF [place], [place] PROCEDURE DATE:  09/16/2014 PROCEDURE:   Colonoscopy, screening and Colonoscopy with snare polypectomy x 3 First Screening Colonoscopy - Avg.  risk and is 50 yrs.  old or older Yes.  Prior Negative Screening - Now for repeat screening. N/A  History of Adenoma - Now for follow-up colonoscopy & has been > or = to 3 yrs.  N/A  Polyps removed today? Yes ASA CLASS:   Class III INDICATIONS:Screening for colonic neoplasia and Colorectal Neoplasm Risk Assessment for this procedure is average risk. MEDICATIONS: Monitored anesthesia care and Per Anesthesia  DESCRIPTION OF PROCEDURE:   After the risks benefits and alternatives of the procedure were thoroughly explained, informed consent was obtained.  The digital rectal exam revealed no abnormalities of the rectum.   The Pentax Ped Colon [id number] endoscope was introduced through the anus and advanced to the cecum, which was identified by both the appendix and ileocecal valve. No adverse events experienced.   The quality of the prep was excellent.  (MoviPrep was used)  The instrument was then slowly withdrawn as the colon was fully examined.    COLON FINDINGS: Three polyps ranging between 3-61mm in size were found in the sigmoid colon and at the cecum.  A polypectomy was performed with a cold snare.  The resection was complete, the polyp tissue was completely retrieved and sent to histology.   There was moderate diverticulosis noted in the left colon.   The examination was otherwise normal.  Retroflexed views revealed no abnormalities. The time to cecum = 3.0 Withdrawal time = 10.9   The scope was withdrawn and the procedure completed. COMPLICATIONS: There were no immediate  complications.  ENDOSCOPIC IMPRESSION: 1.   Three polyps ranging between 3-94mm in size were found in the sigmoid colon and at the cecum; polypectomy was performed with a cold snare 2.   Moderate diverticulosis was noted in the left colon 3.   The examination was otherwise normal  RECOMMENDATIONS: 1. Repeat colonoscopy in 5 years if polyp adenomatous; otherwise 10 years  eSigned:  Eustace Quail, MD 09/16/2014 10:39 AM   cc: The Patient and Daphane Shepherd, [place]

## 2014-09-17 ENCOUNTER — Encounter: Payer: Self-pay | Admitting: Internal Medicine

## 2014-09-17 ENCOUNTER — Encounter (HOSPITAL_COMMUNITY): Payer: Self-pay | Admitting: Internal Medicine

## 2014-11-30 ENCOUNTER — Telehealth: Payer: Self-pay | Admitting: Family Medicine

## 2014-11-30 NOTE — Telephone Encounter (Signed)
lomo to call back to reschedule his appt that he has with Chelle in Oct just an ov visit

## 2015-01-07 ENCOUNTER — Other Ambulatory Visit: Payer: Self-pay | Admitting: Cardiology

## 2015-01-29 ENCOUNTER — Other Ambulatory Visit: Payer: Self-pay

## 2015-01-29 DIAGNOSIS — I5022 Chronic systolic (congestive) heart failure: Secondary | ICD-10-CM

## 2015-01-29 MED ORDER — SPIRONOLACTONE 25 MG PO TABS: 25.0000 mg | ORAL_TABLET | Freq: Every day | ORAL | Status: DC

## 2015-02-26 ENCOUNTER — Ambulatory Visit (INDEPENDENT_AMBULATORY_CARE_PROVIDER_SITE_OTHER): Payer: BLUE CROSS/BLUE SHIELD | Admitting: *Deleted

## 2015-02-26 ENCOUNTER — Encounter (HOSPITAL_COMMUNITY): Payer: Self-pay

## 2015-02-26 ENCOUNTER — Ambulatory Visit (HOSPITAL_COMMUNITY)
Admission: RE | Admit: 2015-02-26 | Discharge: 2015-02-26 | Disposition: A | Discharge status: Home / Self Care | Payer: BLUE CROSS/BLUE SHIELD | Source: Ambulatory Visit | Attending: Cardiology | Admitting: Cardiology

## 2015-02-26 VITALS — BP 124/78 | HR 83 | Wt 315.8 lb

## 2015-02-26 DIAGNOSIS — Z79899 Other long term (current) drug therapy: Secondary | ICD-10-CM | POA: Insufficient documentation

## 2015-02-26 DIAGNOSIS — Z95810 Presence of automatic (implantable) cardiac defibrillator: Secondary | ICD-10-CM | POA: Diagnosis not present

## 2015-02-26 DIAGNOSIS — Z8249 Family history of ischemic heart disease and other diseases of the circulatory system: Secondary | ICD-10-CM | POA: Diagnosis not present

## 2015-02-26 DIAGNOSIS — I10 Essential (primary) hypertension: Secondary | ICD-10-CM | POA: Insufficient documentation

## 2015-02-26 DIAGNOSIS — I429 Cardiomyopathy, unspecified: Secondary | ICD-10-CM

## 2015-02-26 DIAGNOSIS — I428 Other cardiomyopathies: Secondary | ICD-10-CM | POA: Insufficient documentation

## 2015-02-26 DIAGNOSIS — I5022 Chronic systolic (congestive) heart failure: Secondary | ICD-10-CM

## 2015-02-26 DIAGNOSIS — E669 Obesity, unspecified: Secondary | ICD-10-CM | POA: Diagnosis not present

## 2015-02-26 DIAGNOSIS — I447 Left bundle-branch block, unspecified: Secondary | ICD-10-CM | POA: Diagnosis not present

## 2015-02-26 DIAGNOSIS — E785 Hyperlipidemia, unspecified: Secondary | ICD-10-CM | POA: Insufficient documentation

## 2015-02-26 DIAGNOSIS — Z7982 Long term (current) use of aspirin: Secondary | ICD-10-CM | POA: Insufficient documentation

## 2015-02-26 LAB — BASIC METABOLIC PANEL
ANION GAP: 10 (ref 5–15)
BUN: 12 mg/dL (ref 6–20)
CO2: 26 mmol/L (ref 22–32)
Calcium: 9.4 mg/dL (ref 8.9–10.3)
Chloride: 107 mmol/L (ref 101–111)
Creatinine, Ser: 1.02 mg/dL (ref 0.61–1.24)
Glucose, Bld: 117 mg/dL — ABNORMAL HIGH (ref 65–99)
POTASSIUM: 4.4 mmol/L (ref 3.5–5.1)
SODIUM: 143 mmol/L (ref 135–145)

## 2015-02-26 MED ORDER — SACUBITRIL-VALSARTAN 97-103 MG PO TABS: 1.0000 | ORAL_TABLET | Freq: Two times a day (BID) | ORAL | Status: DC

## 2015-02-26 NOTE — Patient Instructions (Signed)
INCREASE Entresto to 97/103 mg, one tab twice a day  Labs today, in 2 weeks, and in 3 months  Your physician recommends that you schedule a follow-up appointment in: 6 months  Do the following things EVERYDAY: 1) Weigh yourself in the morning before breakfast. Write it down and keep it in a log. 2) Take your medicines as prescribed 3) Eat low salt foods-Limit salt (sodium) to 2000 mg per day.  4) Stay as active as you can everyday 5) Limit all fluids for the day to less than 2 liters 6)

## 2015-02-26 NOTE — Progress Notes (Signed)
Patient ID: Gary Williamson, male   DOB: Jun 26, 1965, 49 y.o.   MRN: [id number] PCP: Harrison Mons  49 yo with a h/o nonischemic cardiomyopathy, LBBB, HTN, and NYHA Class II CHF presents for cardiology followup.  He has been doing well recently.  Echo in 12/14 showed that his EF remains 20-25%.  He had a St Jude CRT-D system placed in 4/15.  Last echo in 12/15 showed EF 25-30%, mild LV dilation, normal RV size and systolic function.   He has been doing well symptomatically.  No exertional dyspnea.  Working out with a Clinical research associate 3 times a week.  Weight is stable.  No lightheadedness, no chest pain, no orthopnea/PND, no palpitations.     Labs (8/11): K 4.5, creatinine 1.3, LDL 79, HDL 26 Labs (2/12): K 4.3, creatinine 1.5 Labs (7/12): K 4.5, creatinine 1.2 Labs (12/14): K 4.4, creatinine 1.2, LDL 87, HDL 32 Labs (4/15): K 4.2, creatinine 1.03 Labs (03/25/14) : K 4.9 Creatinine 0.9 Cholesterol 161 TGL 104 HDL 31 LDL 109  Labs (4/16): K 4.4, creatinine 1.12, HCT 42.2, LDL 101, HDL 30  Allergies (verified):  No Known Drug Allergies  Past Medical History: 1. Cardiomyopathy: Nonischemic.  He had a left heart cath in 2002 or 2003 with no significant coronary disease per his report (this was done in [place]). It sounds like his CMP has been present since about 2000.  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  Echo (5/11): severe LV dilation with EF < 20%, diffuse hypokinesis, moderate diastolic dysfunction, severe left atrial enlargement.  First hospitalization for CHF was in 5/11.  No drug use.  Adenosine myoview (6/11): EF 21%, diffuse hypokinesis, scar in the anterior and inferior walls and at the apex.  Mild peri-infarct ischemia.  LHC was done (7/11) showing EF 15%, global hypokinesis, LVEDP 22 mmHg, no angiographic CAD.  Echo (10/11): EF 20-25%, severely dilated LV with diffuse hypokinesis, mild MR.  Echo (12/14) with EF 20-25%, moderately dilated LV, diffuse  hypokinesis, cannot rule out apical thrombus.  He was unable to tolerate cardiac MRI.  He had St Jude CRT-D implantation in 4/15. Echo (12/15) with EF 25-30%, mild LV dilation, mild LVH, normal RV size and systolic function.  2. Hypertension 3. Chronic left bundle branch block 4. Hyperlipidemia  Family History: Family history is negative for premature diagnosis of coronary artery disease or any arrhythmia or history of heart failure in first-degree relatives.  Mother and father do have a history of hypertension  Social History: The patient lives in [place] with his wife and 2 children.   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   Current Outpatient Prescriptions  Medication Sig Dispense Refill  . aspirin 81 MG EC tablet Take 1 tablet (81 mg total) by mouth daily. 30 tablet 2  . carvedilol (COREG) 25 MG tablet Take 1 tablet (25 mg total) by mouth 2 (two) times daily. 180 tablet 3  . fish oil-omega-3 fatty acids 1000 MG capsule Take 1 capsule (1 g total) by mouth daily. 30 capsule 2  . furosemide (LASIX) 40 MG tablet Take 1 tablet (40 mg total) by mouth daily. 90 tablet 2  . Multiple Vitamin (MULTIVITAMIN) tablet Take 1 tablet  by mouth daily.      . simvastatin (ZOCOR) 40 MG tablet Take 1 tablet (40 mg total) by mouth every evening. 90 tablet 1  . spironolactone (ALDACTONE) 25 MG tablet Take 1 tablet (25 mg total) by mouth daily. 90 tablet 0  . sacubitril-valsartan (ENTRESTO) 97-103 MG Take 1 tablet by mouth 2 (two) times daily. 180 tablet 3   No current facility-administered medications for this encounter.    BP 124/78 mmHg  Pulse 83  Wt 315 lb 12.8 oz (143.246 kg)  SpO2 96% General:  Well developed, well nourished, in no acute distress.  Obese.  Neck:  Neck  supple, no JVD. No masses, thyromegaly or abnormal cervical nodes. Lungs:  Clear bilaterally to auscultation and percussion. Heart:  Non-displaced PMI, chest non-tender; regular rate and rhythm, S1, S2 without murmurs, rubs or gallops. S2 is paradoxically split.  Carotid upstroke normal, no bruit.  Pedals normal pulses. Trace ankle edema, no varicosities. Abdomen:  Bowel sounds positive; abdomen soft and non-tender without masses, organomegaly, or hernias noted. No hepatosplenomegaly. Extremities:  No clubbing or cyanosis. Neurologic:  Alert and oriented x 3. Psych:  Normal affect.  Assessment/Plan: 1. Nonischemic cardiomyopathy: Doing well. NYHA class I-II symptoms, stable.  He now has a Research officer, political party CRT-D system. Euvolemic on exam.  05/01/15 ECHO EF 25-30%. Unable to tolerate Bidil due to headaches.  - Continue lasix 40 mg daily.  - On goal dose of coreg 25 mg twice a day - Increase Entresto to 97/103 mg twice a day. Check BMET in 10 days.  Will need BMET again in 3-4 months given spironolactone use. - Continue spironolactone 25 mg daily 2. Obesity: Patient needs to lose weight. Working with trainer, needs to cut back on portions at meals.    [name] 02/26/2015

## 2015-02-27 ENCOUNTER — Encounter: Payer: Self-pay | Admitting: *Deleted

## 2015-02-27 ENCOUNTER — Ambulatory Visit: Payer: BLUE CROSS/BLUE SHIELD | Admitting: Physician Assistant

## 2015-02-27 DIAGNOSIS — Z006 Encounter for examination for normal comparison and control in clinical research program: Secondary | ICD-10-CM

## 2015-02-27 NOTE — Progress Notes (Signed)
Remote ICD transmission.   

## 2015-02-27 NOTE — Progress Notes (Signed)
STATUS FIRST Informed Consent    Subject Name: [name]  Subject met inclusion and exclusion criteria.The informed consent form, study requirements and expectations were reviewed with the subject and questions and concerns were addressed prior to the signing of the consent form. The subject verbalized understanding of the trail requirements. The subject agreed to participate in the STATUS FIRST research study and signed the informed consent at 09:52 am on 02-26-15 The informed consent was obtained prior to performance of any protocol-specific procedures for the subject. A copy of the signed informed consent was given to the subject and a copy was placed in the subjects's medical record.    Gary Williamson 02/26/15 9:52

## 2015-03-10 LAB — CUP PACEART REMOTE DEVICE CHECK
Battery Remaining Longevity: 66 mo
Battery Remaining Percentage: 76 %
Brady Statistic RA Percent Paced: 0 %
HighPow Impedance: 68 Ohm
Implantable Lead Implant Date: 20150421
Implantable Lead Implant Date: 20150421
Implantable Lead Implant Date: 20150421
Implantable Lead Location: 753857
Implantable Lead Location: 753860
Lead Channel Impedance Value: 380 Ohm
Lead Channel Impedance Value: 460 Ohm
Lead Channel Pacing Threshold Amplitude: 0.625 V
Lead Channel Pacing Threshold Pulse Width: 0.4 ms
Lead Channel Sensing Intrinsic Amplitude: 3.8 mV
Lead Channel Sensing Intrinsic Amplitude: 6.5 mV
Lead Channel Setting Pacing Amplitude: 2 V
Lead Channel Setting Pacing Amplitude: 2 V
Lead Channel Setting Pacing Pulse Width: 0.5 ms
Lead Channel Setting Pacing Pulse Width: 0.5 ms
MDC IDC LEAD LOCATION: 753859
MDC IDC MSMT LEADCHNL LV IMPEDANCE VALUE: 830 Ohm
MDC IDC MSMT LEADCHNL RV PACING THRESHOLD AMPLITUDE: 0.625 V
MDC IDC MSMT LEADCHNL RV PACING THRESHOLD PULSEWIDTH: 0.5 ms
MDC IDC SESS DTM: 20161107134238
MDC IDC SET LEADCHNL RA PACING AMPLITUDE: 2 V
MDC IDC SET LEADCHNL RV SENSING SENSITIVITY: 0.5 mV
MDC IDC STAT BRADY RV PERCENT PACED: 0 %
Pulse Gen Serial Number: 7157119

## 2015-03-11 ENCOUNTER — Encounter: Payer: Self-pay | Admitting: Cardiology

## 2015-03-12 ENCOUNTER — Ambulatory Visit (HOSPITAL_COMMUNITY)
Admission: RE | Admit: 2015-03-12 | Discharge: 2015-03-12 | Disposition: A | Discharge status: Home / Self Care | Payer: BLUE CROSS/BLUE SHIELD | Source: Ambulatory Visit | Attending: Internal Medicine | Admitting: Internal Medicine

## 2015-03-12 DIAGNOSIS — I5022 Chronic systolic (congestive) heart failure: Secondary | ICD-10-CM | POA: Diagnosis not present

## 2015-03-12 LAB — BASIC METABOLIC PANEL
ANION GAP: 7 (ref 5–15)
BUN: 11 mg/dL (ref 6–20)
CHLORIDE: 106 mmol/L (ref 101–111)
CO2: 26 mmol/L (ref 22–32)
Calcium: 9 mg/dL (ref 8.9–10.3)
Creatinine, Ser: 1.2 mg/dL (ref 0.61–1.24)
GFR calc non Af Amer: >60 mL/min (ref >60)
Glucose, Bld: 113 mg/dL — ABNORMAL HIGH (ref 65–99)
POTASSIUM: 4.2 mmol/L (ref 3.5–5.1)
Sodium: 139 mmol/L (ref 135–145)

## 2015-03-23 ENCOUNTER — Ambulatory Visit (INDEPENDENT_AMBULATORY_CARE_PROVIDER_SITE_OTHER): Payer: BLUE CROSS/BLUE SHIELD | Admitting: Family Medicine

## 2015-03-23 ENCOUNTER — Ambulatory Visit (INDEPENDENT_AMBULATORY_CARE_PROVIDER_SITE_OTHER): Payer: BLUE CROSS/BLUE SHIELD

## 2015-03-23 VITALS — BP 156/94 | HR 84 | Temp 98.1°F | Resp 20 | Ht 73.0 in | Wt 317.4 lb

## 2015-03-23 DIAGNOSIS — M25571 Pain in right ankle and joints of right foot: Secondary | ICD-10-CM | POA: Diagnosis not present

## 2015-03-23 DIAGNOSIS — S93401A Sprain of unspecified ligament of right ankle, initial encounter: Secondary | ICD-10-CM

## 2015-03-23 MED ORDER — DICLOFENAC SODIUM 1 % TD GEL: 2.0000 g | Freq: Four times a day (QID) | TRANSDERMAL | Status: DC

## 2015-03-23 NOTE — Progress Notes (Signed)
Subjective:    Patient ID: Gary Williamson, male    DOB: 1965/07/22, 49 y.o.   MRN: [id number]  03/23/2015  Ankle Pain   HPI This 49 y.o. male presents for evaluation of R ankle pain.  Traveled out of the country last week.  Onset two days ago.  Np swelling.  Medial ankle pain; mild swelling.  Walked some on trip but nothing unusual. Walking through the airport; did walk through facilities.  No similar pain.  No previous injury.  No icing or heat; did elevate leg; applied deep blue. No medications.  Nighttime awakening due to pain. No calf swelling.  No history of hout.  Chronic knee pain.     Review of Systems  Constitutional: Negative for fever, chills, diaphoresis and fatigue.  Musculoskeletal: Positive for joint swelling, arthralgias and gait problem.  Neurological: Negative for weakness and numbness.    Past Medical History  Diagnosis Date  . NICM (nonischemic cardiomyopathy) ([place])     Probably nonischemic; LHC in 2002/03 no sig CAD per report (done in [place]). Cardiomyopathy for 9-10 yrs. 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 Echo 5/11: severe LVE, EF <20%, diff HK, mod dias dys, sev. LAE. Hosp for CHF in 5/11. Ad. MV 6/11: EF 21%, mild per-infart ischemia. LHC (7/11): EF 15%, no CAD, LVEDP 22; Echo (10/11): EF 20-25%;  Echo (12/12):  EF 20%, mod LVH, Gr 1 DD, diff HK, mild LAE  . Hypertension   . LBBB (left bundle branch block)     Chronic  . Pure hypercholesterolemia   . Chronic systolic heart failure ([place])   . 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 Severe obesity ([place])   . OSA (obstructive sleep apnea)     cpap used   Past Surgical History  Procedure Laterality Date  . Cardiac catheterization  2002 or 2003  . Left heart cath  11/13/09    Dr [name]  . Bi-ventricular implantable cardioverter defibrillator N/A 08/21/2013    Procedure: BI-VENTRICULAR IMPLANTABLE CARDIOVERTER DEFIBRILLATOR  (CRT-D);  Surgeon: Coralyn Mark,  MD;  Location[facility] CATH LAB;  Service: Cardiovascular;  Laterality: N/A;  . Colonoscopy N/A 09/16/2014    Procedure: COLONOSCOPY;  Surgeon: [name], MD;  Location: [facility] ENDOSCOPY;  Service: Endoscopy;  Laterality: N/A;   No Known Allergies  Social History   Social History  . Marital Status: Married    Spouse Name: [name]  . Number of Children: 2  . Years of Education: N/A   Occupational History  . Mudlogger of Programmer, applications for [employer]  .     Social History Main Topics  . Smoking status: Never Smoker   . Smokeless tobacco: Never Used  . Alcohol Use: 0.0 - 1.0 oz/week    0-2 Standard drinks or equivalent per week     Comment: rare to occ.  . Drug Use: No  . Sexual Activity: Yes    Birth Control/ Protection: Condom   Other Topics Concern  . Not on file   Social History Narrative   Marital status: married x 15 years; happily married      Children: 2 children (12,9)      Lives in [place] with his wife and 2 children      Employment: [employer]; Solicitor x 9 years; likes work.      Tobacco: none      Alcohol:  1 drink per week   Regular  diet      Exercise:  Walking three days per week; 20 minutes.       Seatbelt: 100%      Family History  Problem Relation Age of Onset  . Hypertension Mother   . Alzheimer's disease Mother   . Hyperlipidemia Mother   . Breast cancer Mother   . Cancer Mother     breast cancer  . Coronary artery disease Neg Hx   . Heart failure Neg Hx   . Heart disease Neg Hx   . Stroke Maternal Grandmother   . Arthritis Father   . Dementia Father   . Hypertension Father   . Alzheimer's disease Father   . COPD Brother        Objective:    BP 156/94 mmHg  Pulse 84  Temp(Src) 98.1 F (36.7 C) (Oral)  Resp 20  Ht 6\' 1"  (1.854 m)  Wt 317 lb 6.4 oz (143.972 kg)  BMI 41.89 kg/m2  SpO2 98% Physical Exam  Constitutional: He is oriented to person, place, and time. He appears well-developed and well-nourished. No  distress.  Morbidly obese  HENT:  Head: Normocephalic and atraumatic.  Eyes: Conjunctivae and EOM are normal. Pupils are equal, round, and reactive to light.  Neck: Normal range of motion. Neck supple. Carotid bruit is not present. No thyromegaly present.  Cardiovascular: Normal rate, regular rhythm, normal heart sounds and intact distal pulses.  Exam reveals no gallop and no friction rub.   No murmur heard. Pulmonary/Chest: Effort normal and breath sounds normal. He has no wheezes. He has no rales.  Musculoskeletal:       Right ankle: He exhibits decreased range of motion and swelling. He exhibits no ecchymosis, no deformity, no laceration and normal pulse. Tenderness. Lateral malleolus and medial malleolus tenderness found. No posterior TFL, no head of 5th metatarsal and no proximal fibula tenderness found. Achilles tendon normal. Achilles tendon exhibits no pain and no defect.       Right lower leg: Normal. He exhibits no tenderness, no bony tenderness, no swelling, no edema, no deformity and no laceration.       Right foot: Normal. There is normal range of motion, no tenderness, no bony tenderness and no swelling.  Lymphadenopathy:    He has no cervical adenopathy.  Neurological: He is alert and oriented to person, place, and time. No cranial nerve deficit.  Skin: Skin is warm and dry. No rash noted. He is not diaphoretic.  Psychiatric: He has a normal mood and affect. His behavior is normal.  Nursing note and vitals reviewed.   [facility] reading (PRIMARY) by  Dr. Tamala Julian.  R ANKLE: degenerative changes; no acute process      Assessment & Plan:   1. Right ankle pain   2. Right ankle sprain, initial encounter    -New. -Recommend rest, icing, elevation, Voltaren gel.   -Fitted with SWEEDO lace up brace and crutches. -If unable to wean crutches in 5 days, call for ortho referral.   Orders Placed This Encounter  Procedures  . DG Ankle Complete Right    Standing Status: Future      Number of Occurrences: 1     Standing Expiration Date: 03/22/2016    Order Specific Question:  Reason for Exam (SYMPTOM  OR DIAGNOSIS REQUIRED)    Answer:  medial ankle pain; no injury; prolonged walking in past week    Order Specific Question:  Preferred imaging location?    Answer:  External   Meds ordered  this encounter  Medications  . diclofenac sodium (VOLTAREN) 1 % GEL    Sig: Apply 2 g topically 4 (four) times daily.    Dispense:  100 g    Refill:  0    No Follow-up on file.   Teruko Joswick Elayne Guerin, M.D. [facility] [address] [place], [place]  [id number] [phone] phone [phone] fax

## 2015-04-07 ENCOUNTER — Telehealth (HOSPITAL_COMMUNITY): Payer: Self-pay

## 2015-04-07 NOTE — Telephone Encounter (Signed)
Patient called CHF triage line with questions about his medication ENTRESTO. Will forward to CHF pharmacist Doroteo Bradford to address with patient.

## 2015-04-09 ENCOUNTER — Other Ambulatory Visit (HOSPITAL_COMMUNITY): Payer: Self-pay | Admitting: Pharmacist

## 2015-04-09 ENCOUNTER — Other Ambulatory Visit (HOSPITAL_COMMUNITY): Payer: Self-pay | Admitting: Cardiology

## 2015-04-09 DIAGNOSIS — I428 Other cardiomyopathies: Secondary | ICD-10-CM

## 2015-04-09 DIAGNOSIS — I5022 Chronic systolic (congestive) heart failure: Secondary | ICD-10-CM

## 2015-04-09 MED ORDER — CARVEDILOL 25 MG PO TABS: 25.0000 mg | ORAL_TABLET | Freq: Two times a day (BID) | ORAL | Status: DC

## 2015-04-09 MED ORDER — SPIRONOLACTONE 25 MG PO TABS: 25.0000 mg | ORAL_TABLET | Freq: Every day | ORAL | Status: DC

## 2015-04-09 MED ORDER — FUROSEMIDE 40 MG PO TABS: 40.0000 mg | ORAL_TABLET | Freq: Every day | ORAL | Status: DC

## 2015-04-09 MED ORDER — SIMVASTATIN 40 MG PO TABS: 40.0000 mg | ORAL_TABLET | Freq: Every evening | ORAL | Status: DC

## 2015-04-09 MED ORDER — SACUBITRIL-VALSARTAN 97-103 MG PO TABS: 1.0000 | ORAL_TABLET | Freq: Two times a day (BID) | ORAL | Status: DC

## 2015-04-10 ENCOUNTER — Telehealth (HOSPITAL_COMMUNITY): Payer: Self-pay | Admitting: Pharmacist

## 2015-04-10 NOTE — Telephone Encounter (Signed)
Entresto 97/103 mg PA approved through [place] until 04/08/2016. Pharmacy verified copay $60/month.   Ruta Hinds. Velva Harman, PharmD, BCPS, CPP Clinical Pharmacist Pager: [phone] Phone: [phone] 04/10/2015 2:40 PM

## 2015-05-28 ENCOUNTER — Ambulatory Visit (HOSPITAL_COMMUNITY)
Admission: RE | Admit: 2015-05-28 | Discharge: 2015-05-28 | Disposition: A | Discharge status: Home / Self Care | Payer: BLUE CROSS/BLUE SHIELD | Source: Ambulatory Visit | Attending: Internal Medicine | Admitting: Internal Medicine

## 2015-05-28 ENCOUNTER — Ambulatory Visit (INDEPENDENT_AMBULATORY_CARE_PROVIDER_SITE_OTHER): Payer: BLUE CROSS/BLUE SHIELD | Admitting: *Deleted

## 2015-05-28 DIAGNOSIS — I428 Other cardiomyopathies: Secondary | ICD-10-CM

## 2015-05-28 DIAGNOSIS — I429 Cardiomyopathy, unspecified: Secondary | ICD-10-CM

## 2015-05-28 DIAGNOSIS — I5022 Chronic systolic (congestive) heart failure: Secondary | ICD-10-CM | POA: Insufficient documentation

## 2015-05-28 LAB — BASIC METABOLIC PANEL
Anion gap: 11 (ref 5–15)
BUN: 13 mg/dL (ref 6–20)
CALCIUM: 9.1 mg/dL (ref 8.9–10.3)
CO2: 26 mmol/L (ref 22–32)
CREATININE: 1.15 mg/dL (ref 0.61–1.24)
Chloride: 104 mmol/L (ref 101–111)
GLUCOSE: 106 mg/dL — AB (ref 65–99)
Potassium: 3.8 mmol/L (ref 3.5–5.1)
Sodium: 141 mmol/L (ref 135–145)

## 2015-05-28 NOTE — Progress Notes (Signed)
Remote ICD transmission.   

## 2015-06-02 ENCOUNTER — Telehealth (HOSPITAL_COMMUNITY): Payer: Self-pay | Admitting: Pharmacist

## 2015-06-02 NOTE — Telephone Encounter (Signed)
Pharmacy stated that patient needed new PA for [facility]. Submitted new PA and [place] verified that PA is approved through 04/08/2016. Info relayed to pharmacy who verified copay $60/mo and patient can still use copay card so that he should only have to pay $10/mo through the end of the year. Left VM for patient to call back with any issues.   Ruta Hinds. Velva Harman, PharmD, BCPS, CPP Clinical Pharmacist Pager: [phone] Phone: [phone] 06/02/2015 11:46 AM

## 2015-06-11 LAB — CUP PACEART REMOTE DEVICE CHECK
Battery Remaining Percentage: 73 %
Battery Voltage: 2.96 V
Brady Statistic AP VP Percent: 4.4 %
Brady Statistic AS VP Percent: 94 %
HIGH POWER IMPEDANCE MEASURED VALUE: 75 Ohm
HighPow Impedance: 75 Ohm
Implantable Lead Implant Date: 20150421
Implantable Lead Implant Date: 20150421
Implantable Lead Location: 753857
Implantable Lead Location: 753859
Lead Channel Impedance Value: 480 Ohm
Lead Channel Impedance Value: 810 Ohm
Lead Channel Pacing Threshold Amplitude: 0.625 V
Lead Channel Pacing Threshold Pulse Width: 0.5 ms
Lead Channel Sensing Intrinsic Amplitude: 8.5 mV
Lead Channel Setting Pacing Amplitude: 2 V
Lead Channel Setting Pacing Amplitude: 2 V
Lead Channel Setting Sensing Sensitivity: 0.5 mV
MDC IDC LEAD IMPLANT DT: 20150421
MDC IDC LEAD LOCATION: 753860
MDC IDC MSMT BATTERY REMAINING LONGEVITY: 65 mo
MDC IDC MSMT LEADCHNL RA IMPEDANCE VALUE: 380 Ohm
MDC IDC MSMT LEADCHNL RA PACING THRESHOLD PULSEWIDTH: 0.5 ms
MDC IDC MSMT LEADCHNL RA SENSING INTR AMPL: 4 mV
MDC IDC MSMT LEADCHNL RV PACING THRESHOLD AMPLITUDE: 0.625 V
MDC IDC SESS DTM: 20170125070016
MDC IDC SET LEADCHNL LV PACING PULSEWIDTH: 0.5 ms
MDC IDC SET LEADCHNL RV PACING AMPLITUDE: 2 V
MDC IDC SET LEADCHNL RV PACING PULSEWIDTH: 0.5 ms
MDC IDC STAT BRADY AP VS PERCENT: 1 %
MDC IDC STAT BRADY AS VS PERCENT: 1.4 %
MDC IDC STAT BRADY RA PERCENT PACED: 4.1 %
Pulse Gen Serial Number: 7157119

## 2015-06-13 ENCOUNTER — Encounter: Payer: Self-pay | Admitting: Cardiology

## 2015-06-25 ENCOUNTER — Ambulatory Visit: Payer: BLUE CROSS/BLUE SHIELD | Admitting: Internal Medicine

## 2015-08-04 ENCOUNTER — Other Ambulatory Visit: Payer: Self-pay | Admitting: Cardiology

## 2015-08-05 NOTE — Telephone Encounter (Signed)
carvedilol (COREG) 25 MG tablet  Medication   Ordering: Adora Fridge, RPH  Authorizing: Larey Dresser, MD   Date: 04/09/2015  Department: [place]      Order Providers    Prescribing Provider Encounter Provider   Larey Dresser, MD Adora Fridge, [facility]    Medication Detail      Disp Refills Start End     carvedilol (COREG) 25 MG tablet 180 tablet 3 04/09/2015     Sig - Route: Take 1 tablet (25 mg total) by mouth 2 (two) times daily. - Oral    E-Prescribing Status: Receipt confirmed by pharmacy (04/09/2015 12:14 PM EST)     Pharmacy    [facility] [place], [place]

## 2015-08-21 ENCOUNTER — Ambulatory Visit (INDEPENDENT_AMBULATORY_CARE_PROVIDER_SITE_OTHER): Payer: BLUE CROSS/BLUE SHIELD | Admitting: Family Medicine

## 2015-08-21 VITALS — BP 124/80 | HR 81 | Temp 97.7°F | Resp 18 | Ht 73.0 in | Wt 327.0 lb

## 2015-08-21 DIAGNOSIS — I10 Essential (primary) hypertension: Secondary | ICD-10-CM | POA: Diagnosis not present

## 2015-08-21 DIAGNOSIS — D125 Benign neoplasm of sigmoid colon: Secondary | ICD-10-CM

## 2015-08-21 DIAGNOSIS — R3129 Other microscopic hematuria: Secondary | ICD-10-CM

## 2015-08-21 DIAGNOSIS — Z1159 Encounter for screening for other viral diseases: Secondary | ICD-10-CM

## 2015-08-21 DIAGNOSIS — Z125 Encounter for screening for malignant neoplasm of prostate: Secondary | ICD-10-CM

## 2015-08-21 DIAGNOSIS — I5022 Chronic systolic (congestive) heart failure: Secondary | ICD-10-CM | POA: Diagnosis not present

## 2015-08-21 DIAGNOSIS — D120 Benign neoplasm of cecum: Secondary | ICD-10-CM

## 2015-08-21 DIAGNOSIS — G4733 Obstructive sleep apnea (adult) (pediatric): Secondary | ICD-10-CM

## 2015-08-21 DIAGNOSIS — Z0000 Encounter for general adult medical examination without abnormal findings: Secondary | ICD-10-CM

## 2015-08-21 DIAGNOSIS — E7800 Pure hypercholesterolemia, unspecified: Secondary | ICD-10-CM | POA: Diagnosis not present

## 2015-08-21 DIAGNOSIS — E559 Vitamin D deficiency, unspecified: Secondary | ICD-10-CM

## 2015-08-21 DIAGNOSIS — I429 Cardiomyopathy, unspecified: Secondary | ICD-10-CM | POA: Diagnosis not present

## 2015-08-21 DIAGNOSIS — I428 Other cardiomyopathies: Secondary | ICD-10-CM

## 2015-08-21 DIAGNOSIS — Z114 Encounter for screening for human immunodeficiency virus [HIV]: Secondary | ICD-10-CM

## 2015-08-21 LAB — POCT URINALYSIS DIP (MANUAL ENTRY)
BILIRUBIN UA: NEGATIVE
GLUCOSE UA: NEGATIVE
Ketones, POC UA: NEGATIVE
Leukocytes, UA: NEGATIVE
NITRITE UA: NEGATIVE
RBC UA: NEGATIVE
Spec Grav, UA: 1.02
Urobilinogen, UA: 0.2
pH, UA: 5.5

## 2015-08-21 LAB — LIPID PANEL
CHOLESTEROL: 180 mg/dL (ref 125–200)
HDL: 29 mg/dL — ABNORMAL LOW (ref >=40)
LDL Cholesterol: 119 mg/dL (ref <130)
TRIGLYCERIDES: 162 mg/dL — AB (ref <150)
Total CHOL/HDL Ratio: 6.2 Ratio — ABNORMAL HIGH (ref <=5.0)
VLDL: 32 mg/dL — AB (ref <30)

## 2015-08-21 LAB — CBC WITH DIFFERENTIAL/PLATELET
BASOS PCT: 0 %
Basophils Absolute: 0 cells/uL (ref 0–200)
Eosinophils Absolute: 252 cells/uL (ref 15–500)
Eosinophils Relative: 3 %
HEMATOCRIT: 42.5 % (ref 38.5–50.0)
HEMOGLOBIN: 14.5 g/dL (ref 13.2–17.1)
LYMPHS ABS: 1764 {cells}/uL (ref 850–3900)
LYMPHS PCT: 21 %
MCH: 29.4 pg (ref 27.0–33.0)
MCHC: 34.1 g/dL (ref 32.0–36.0)
MCV: 86 fL (ref 80.0–100.0)
MONO ABS: 672 {cells}/uL (ref 200–950)
MPV: 10 fL (ref 7.5–12.5)
Monocytes Relative: 8 %
NEUTROS ABS: 5712 {cells}/uL (ref 1500–7800)
Neutrophils Relative %: 68 %
Platelets: 216 10*3/uL (ref 140–400)
RBC: 4.94 MIL/uL (ref 4.20–5.80)
RDW: 15 % (ref 11.0–15.0)
WBC: 8.4 10*3/uL (ref 3.8–10.8)

## 2015-08-21 LAB — COMPREHENSIVE METABOLIC PANEL
ALBUMIN: 3.9 g/dL (ref 3.6–5.1)
ALK PHOS: 62 U/L (ref 40–115)
ALT: 23 U/L (ref 9–46)
AST: 19 U/L (ref 10–35)
BILIRUBIN TOTAL: 0.7 mg/dL (ref 0.2–1.2)
BUN: 14 mg/dL (ref 7–25)
CALCIUM: 8.7 mg/dL (ref 8.6–10.3)
CO2: 24 mmol/L (ref 20–31)
Chloride: 105 mmol/L (ref 98–110)
Creat: 0.99 mg/dL (ref 0.70–1.33)
GLUCOSE: 113 mg/dL — AB (ref 65–99)
POTASSIUM: 4 mmol/L (ref 3.5–5.3)
Sodium: 138 mmol/L (ref 135–146)
TOTAL PROTEIN: 6.5 g/dL (ref 6.1–8.1)

## 2015-08-21 LAB — HEMOGLOBIN A1C
HEMOGLOBIN A1C: 6.2 % — AB (ref <5.7)
Mean Plasma Glucose: 131 mg/dL

## 2015-08-21 LAB — POC MICROSCOPIC URINALYSIS (UMFC)

## 2015-08-21 LAB — HIV ANTIBODY (ROUTINE TESTING W REFLEX): HIV 1&2 Ab, 4th Generation: NONREACTIVE

## 2015-08-21 LAB — HEPATITIS C ANTIBODY: HCV Ab: NEGATIVE

## 2015-08-21 LAB — TSH: TSH: 2.68 mIU/L (ref 0.40–4.50)

## 2015-08-21 NOTE — Patient Instructions (Addendum)
   IF you received an x-ray today, you will receive an invoice from [facility]. Please contact [facility] at [phone] with questions or concerns regarding your invoice.   IF you received labwork today, you will receive an invoice from [facility]. Please contact [facility] at [phone] with questions or concerns regarding your invoice.   Our billing staff will not be able to assist you with questions regarding bills from these companies.  You will be contacted with the lab results as soon as they are available. The fastest way to get your results is to activate your My Chart account. Instructions are located on the last page of this paperwork. If you have not heard from us regarding the results in 2 weeks, please contact this office.    Keeping you healthy  Get these tests  Blood pressure- Have your blood pressure checked once a year by your healthcare provider.  Normal blood pressure is 120/80  Weight- Have your body mass index (BMI) calculated to screen for obesity.  BMI is a measure of body fat based on height and weight. You can also calculate your own BMI at www.[place].com/bmi/.  Cholesterol- Have your cholesterol checked every year.  Diabetes- Have your blood sugar checked regularly if you have high blood pressure, high cholesterol, have a family history of diabetes or if you are overweight.  Screening for Colon Cancer- Colonoscopy starting at age 50.  Screening may begin sooner depending on your family history and other health conditions. Follow up colonoscopy as directed by your Gastroenterologist.  Screening for Prostate Cancer- Both blood work (PSA) and a rectal exam help screen for Prostate Cancer.  Screening begins at age 40 with African-American men and at age 50 with Caucasian men.  Screening may begin sooner depending on your family history.  Take these medicines  Aspirin- One aspirin daily can help prevent Heart  disease and Stroke.  Flu shot- Every fall.  Tetanus- Every 10 years.  Zostavax- Once after the age of 60 to prevent Shingles.  Pneumonia shot- Once after the age of 65; if you are younger than 65, ask your healthcare provider if you need a Pneumonia shot.  Take these steps  Don't smoke- If you do smoke, talk to your doctor about quitting.  For tips on how to quit, go to www.smokefree.gov or call 1-800-QUIT-NOW.  Be physically active- Exercise 5 days a week for at least 30 minutes.  If you are not already physically active start slow and gradually work up to 30 minutes of moderate physical activity.  Examples of moderate activity include walking briskly, mowing the yard, dancing, swimming, bicycling, etc.  Eat a healthy diet- Eat a variety of healthy food such as fruits, vegetables, low fat milk, low fat cheese, yogurt, lean meant, poultry, fish, beans, tofu, etc. For more information go to www.thenutritionsource.org  Drink alcohol in moderation- Limit alcohol intake to less than two drinks a day. Never drink and drive.  Dentist- Brush and floss twice daily; visit your dentist twice a year.  Depression- Your emotional health is as important as your physical health. If you're feeling down, or losing interest in things you would normally enjoy please talk to your healthcare provider.  Eye exam- Visit your eye doctor every year.  Safe sex- If you may be exposed to a sexually transmitted infection, use a condom.  Seat belts- Seat belts can save your life; always wear one.  Smoke/Carbon Monoxide detectors- These detectors need to be installed on   the appropriate level of your home.  Replace batteries at least once a year.  Skin cancer- When out in the sun, cover up and use sunscreen 15 SPF or higher.  Violence- If anyone is threatening you, please tell your healthcare provider.  Living Will/ Health care power of attorney- Speak with your healthcare provider and family. 

## 2015-08-21 NOTE — Progress Notes (Signed)
Subjective:    Patient ID: Gary Williamson, male    DOB: [birth year], 50 y.o.   MRN: [id number]  08/21/2015  Annual Exam   HPI This 50 y.o. male presents for Annual Wellness Examination.  Last physical:  08-28-2014 Colonoscopy:  09-16-2014 TDAP:  2013 Pneumovax:  2014 Zostavax:  never Influenza:  2016 Eye exam:  2015; +glasses; due for repeat.   Dental exam:  2 months ago; every six months.  Weight up 17 pounds.    Review of Systems  Constitutional: Negative for fever, chills, diaphoresis, activity change, appetite change, fatigue and unexpected weight change.  HENT: Negative for congestion, dental problem, drooling, ear discharge, ear pain, facial swelling, hearing loss, mouth sores, nosebleeds, postnasal drip, rhinorrhea, sinus pressure, sneezing, sore throat, tinnitus, trouble swallowing and voice change.   Eyes: Negative for photophobia, pain, discharge, redness, itching and visual disturbance.  Respiratory: Negative for apnea, cough, choking, chest tightness, shortness of breath, wheezing and stridor.   Cardiovascular: Negative for chest pain, palpitations and leg swelling.  Gastrointestinal: Negative for nausea, vomiting, abdominal pain, diarrhea, constipation and blood in stool.  Endocrine: Negative for cold intolerance, heat intolerance, polydipsia, polyphagia and polyuria.  Genitourinary: Negative for dysuria, urgency, frequency, hematuria, flank pain, decreased urine volume, discharge, penile swelling, scrotal swelling, enuresis, difficulty urinating, genital sores, penile pain and testicular pain.  Musculoskeletal: Negative for myalgias, back pain, joint swelling, arthralgias, gait problem, neck pain and neck stiffness.  Skin: Negative for color change, pallor, rash and wound.  Allergic/Immunologic: Negative for environmental allergies, food allergies and immunocompromised state.  Neurological: Negative for dizziness, tremors, seizures, syncope, facial asymmetry, speech  difficulty, weakness, light-headedness, numbness and headaches.  Hematological: Negative for adenopathy. Does not bruise/bleed easily.  Psychiatric/Behavioral: Negative for suicidal ideas, hallucinations, behavioral problems, confusion, sleep disturbance, self-injury, dysphoric mood, decreased concentration and agitation. The patient is not nervous/anxious and is not hyperactive.     Past Medical History  Diagnosis Date   NICM (nonischemic cardiomyopathy) ([place])     Probably nonischemic; LHC in 2002/03 no sig CAD per report (done in [place]). Cardiomyopathy for 9-10 yrs. 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 Echo 5/11: severe LVE, EF <20%, diff HK, mod dias dys, sev. LAE. Hosp for CHF in 5/11. Ad. MV 6/11: EF 21%, mild per-infart ischemia. LHC (7/11): EF 15%, no CAD, LVEDP 22; Echo (10/11): EF 20-25%;  Echo (12/12):  EF 20%, mod LVH, Gr 1 DD, diff HK, mild LAE   Hypertension    LBBB (left bundle branch block)     Chronic   Pure hypercholesterolemia    Chronic systolic heart failure (HCC)    Acute idiopathic pericarditis 03/24/2010    Overview:  Viral Idiopathic Pericarditis    Severe obesity (HCC)    OSA (obstructive sleep apnea)     cpap used   Past Surgical History  Procedure Laterality Date   Cardiac catheterization  2002 or 2003   Left heart cath  11/13/09    Dr [name]   Bi-ventricular implantable cardioverter defibrillator N/A 08/21/2013    Procedure: BI-VENTRICULAR IMPLANTABLE CARDIOVERTER DEFIBRILLATOR  (CRT-D);  Surgeon: Coralyn Mark, MD;  Location: [facility] CATH LAB;  Service: Cardiovascular;  Laterality: N/A;   Colonoscopy N/A 09/16/2014    Procedure: COLONOSCOPY;  Surgeon: [name], MD;  Location: [facility] ENDOSCOPY;  Service: Endoscopy;  Laterality: N/A;   No Known Allergies Current Outpatient Prescriptions  Medication Sig Dispense Refill   aspirin 81 MG EC tablet Take 1 tablet (81 mg total) by mouth  daily. 30 tablet 2   carvedilol (COREG) 25 MG tablet Take 1 tablet (25  mg total) by mouth 2 (two) times daily. 180 tablet 3   diclofenac sodium (VOLTAREN) 1 % GEL Apply 2 g topically 4 (four) times daily. 100 g 0   fish oil-omega-3 fatty acids 1000 MG capsule Take 1 capsule (1 g total) by mouth daily. 30 capsule 2   furosemide (LASIX) 40 MG tablet Take 1 tablet (40 mg total) by mouth daily. 90 tablet 2   Multiple Vitamin (MULTIVITAMIN) tablet Take 1 tablet by mouth daily.       sacubitril-valsartan (ENTRESTO) 97-103 MG Take 1 tablet by mouth 2 (two) times daily. 180 tablet 3   simvastatin (ZOCOR) 40 MG tablet Take 1 tablet (40 mg total) by mouth every evening. 90 tablet 3   spironolactone (ALDACTONE) 25 MG tablet Take 1 tablet (25 mg total) by mouth daily. 90 tablet 3   No current facility-administered medications for this visit.   Social History   Social History   Marital Status: Married    Spouse Name: Felicia   Number of Children: 2   Years of Education: N/A   [employer] applications for [employer]       Social History Main Topics   Smoking status: Never Smoker    Smokeless tobacco: Never Used   Alcohol Use: 0.0 - 1.0 oz/week    0-2 Standard drinks or equivalent per week     Comment: rare to occ.   Drug Use: No   Sexual Activity: Yes    Birth Control/ Protection: Condom   Other Topics Concern   Not on file   Social History Narrative   Marital status: married x 15 years; happily married      Children: 2 children (12,9)      Lives in [place] with his wife and 2 children      Employment: [employer]; Solicitor x 9 years; likes work.      Tobacco: none      Alcohol:  1 drink per week   Regular diet      Exercise:  Walking three days per week; 20 minutes.       Seatbelt: 100%      Family History  Problem Relation Age of Onset   Hypertension Mother    Alzheimer's disease Mother    Hyperlipidemia Mother    Breast cancer Mother    Cancer Mother     breast cancer    Coronary artery disease Neg Hx    Heart failure Neg Hx    Heart disease Neg Hx    Stroke Maternal Grandmother    Arthritis Father    Dementia Father    Hypertension Father    Alzheimer's disease Father    COPD Brother        Objective:    BP 124/80 mmHg   Pulse 81   Temp(Src) 97.7 F (36.5 C) (Oral)   Resp 18   Ht 6\' 1"  (1.854 m)   Wt 327 lb (148.326 kg)   BMI 43.15 kg/m2   SpO2 96% Physical Exam  Constitutional: He is oriented to person, place, and time. He appears well-developed and well-nourished. No distress.  HENT:  Head: Normocephalic and atraumatic.  Right Ear: External ear normal.  Left Ear: External ear normal.  Nose: Nose normal.  Mouth/Throat: Oropharynx is clear and moist.  Eyes: Conjunctivae and EOM are normal. Pupils are equal, round,  and reactive to light.  Neck: Normal range of motion. Neck supple. Carotid bruit is not present. No thyromegaly present.  Cardiovascular: Normal rate, regular rhythm, normal heart sounds and intact distal pulses.  Exam reveals no gallop and no friction rub.   No murmur heard. Pulmonary/Chest: Effort normal and breath sounds normal. He has no wheezes. He has no rales.  Abdominal: Soft. Bowel sounds are normal. He exhibits no distension and no mass. There is no tenderness. There is no rebound and no guarding.  Musculoskeletal:       Right shoulder: Normal.       Left shoulder: Normal.       Cervical back: Normal.  Lymphadenopathy:    He has no cervical adenopathy.  Neurological: He is alert and oriented to person, place, and time. He has normal reflexes. No cranial nerve deficit. He exhibits normal muscle tone. Coordination normal.  Skin: Skin is warm and dry. No rash noted. He is not diaphoretic.  Psychiatric: He has a normal mood and affect. His behavior is normal. Judgment and thought content normal.   Results for orders placed or performed in visit on 05/28/15  Implantable device - remote  Result Value Ref Range   Date  Time Interrogation Session [id number]    Pulse Generator Manufacturer [employer]    Pulse Gen Model 3365-40Q Quadra Assura    Pulse Gen Serial Number [id number]    Implantable Pulse Generator Type Cardiac Resynch Therapy Defibulator    Implantable Pulse Generator Implant Date [id number]    Implantable Lead Manufacturer [facility]    Implantable Lead Model [id number]    Implantable Lead Serial Number [id number]    Implantable Lead Implant Date [id number]    Implantable Lead Location [id number]    Implantable Lead Manufacturer [facility]    Implantable Lead Model Jude [id number]    Implantable Lead Serial Number [id number]    Implantable Lead Implant Date AD:427113    Implantable Lead Location [id number]    Implantable Lead Manufacturer [facility]    Implantable Lead Model 1458Q Quartet    Implantable Lead Serial Number [id number]    Implantable Lead Implant Date AD:427113    Implantable Lead Location [id number]    Lead Channel Setting Sensing Sensitivity 0.5 mV   Lead Channel Setting Sensing Adaptation Mode Adaptive Sensing    Lead Channel Setting Pacing Amplitude 2.0 V   Lead Channel Setting Pacing Pulse Width 0.5 ms   Lead Channel Setting Pacing Amplitude 2.0 V   Lead Channel Setting Pacing Pulse Width 0.5 ms   Lead Channel Setting Pacing Amplitude 2.0 V   Lead Channel Setting Pacing Capture Mode Fixed Pacing    Lead Channel Status     Lead Channel Impedance Value 810 ohm   Lead Channel Status     Lead Channel Impedance Value 380 ohm   Lead Channel Sensing Intrinsic Amplitude 4.0 mV   Lead Channel Pacing Threshold Amplitude 0.625 V   Lead Channel Pacing Threshold Pulse Width 0.5 ms   Lead Channel Status     Lead Channel Impedance Value 480 ohm   Lead Channel Sensing Intrinsic Amplitude 8.5 mV   Lead Channel Pacing Threshold Amplitude 0.625 V   Lead Channel Pacing Threshold Pulse Width 0.5 ms   HighPow Impedance 75 ohm   HighPow Impedance 75 ohm   HighPow Imped Status     HighPow Imped Status      Battery Status MOS    Battery Remaining Longevity 65 mo   Battery Remaining Percentage 73.0 %  Battery Voltage 2.96 V   Brady Statistic RA Percent Paced 4.1 %   Brady Statistic AP VP Percent 4.4 %   Brady Statistic AS VP Percent 94.0 %   Brady Statistic AP VS Percent 1.0 %   Brady Statistic AS VS Percent 1.4 %   Eval Rhythm AsBp        Assessment & Plan:   1. Routine physical examination   2. Essential hypertension   3. Pure hypercholesterolemia   4. Morbid obesity due to excess calories ([place])   5. Hematuria, microscopic   6. NICM (nonischemic cardiomyopathy) ([place])   7. Chronic systolic heart failure ([place])   8. OSA (obstructive sleep apnea)   9. Avitaminosis D   10. Benign neoplasm of sigmoid colon   11. Benign neoplasm of cecum   12. Need for hepatitis C screening test   13. Screening for HIV (human immunodeficiency virus)   14. Screening for prostate cancer    -anticipatory guidance --- weight loss, exercise. -colonoscopy UTD. -Immunizations UTD. -obtain screening age appropriate. -no evidence of depression. -RTC six months for hypercholesterolemia, glucose intolerance, HTN. -no family hx of colon cancer or prostate cancer.   Orders Placed This Encounter  Procedures   CBC with Differential/Platelet   Comprehensive metabolic panel    Order Specific Question:  Has the patient fasted?    Answer:  Yes   Hemoglobin A1c   Lipid panel    Order Specific Question:  Has the patient fasted?    Answer:  Yes   TSH   PSA   VITAMIN D 25 Hydroxy (Vit-D Deficiency, Fractures)   HIV antibody   Hepatitis C antibody   POCT urinalysis dipstick   POCT Microscopic Urinalysis ([facility])   No orders of the defined types were placed in this encounter.    No Follow-up on file.    Katlynne Mckercher Elayne Guerin, M.D. [facility] [address] [place], [place]  [address] [phone] phone [phone] fax

## 2015-08-22 LAB — VITAMIN D 25 HYDROXY (VIT D DEFICIENCY, FRACTURES): VIT D 25 HYDROXY: 14 ng/mL — AB (ref 30–100)

## 2015-08-22 LAB — PSA: PSA: 0.47 ng/mL (ref <=4.00)

## 2015-08-27 ENCOUNTER — Ambulatory Visit (INDEPENDENT_AMBULATORY_CARE_PROVIDER_SITE_OTHER): Payer: BLUE CROSS/BLUE SHIELD | Admitting: *Deleted

## 2015-08-27 ENCOUNTER — Telehealth: Payer: Self-pay | Admitting: Cardiology

## 2015-08-27 DIAGNOSIS — I428 Other cardiomyopathies: Secondary | ICD-10-CM

## 2015-08-27 DIAGNOSIS — I5022 Chronic systolic (congestive) heart failure: Secondary | ICD-10-CM

## 2015-08-27 DIAGNOSIS — I429 Cardiomyopathy, unspecified: Secondary | ICD-10-CM

## 2015-08-27 NOTE — Telephone Encounter (Signed)
LMOVM reminding pt to send remote transmission.   

## 2015-08-28 ENCOUNTER — Other Ambulatory Visit: Payer: Self-pay | Admitting: Cardiology

## 2015-08-28 NOTE — Progress Notes (Signed)
Remote ICD transmission.   

## 2015-09-02 ENCOUNTER — Other Ambulatory Visit: Payer: Self-pay

## 2015-10-08 ENCOUNTER — Encounter: Payer: Self-pay | Admitting: Cardiology

## 2015-10-10 LAB — CUP PACEART REMOTE DEVICE CHECK
Battery Remaining Longevity: 62 mo
Battery Remaining Percentage: 70 %
Battery Voltage: 2.96 V
HIGH POWER IMPEDANCE MEASURED VALUE: 69 Ohm
HIGH POWER IMPEDANCE MEASURED VALUE: 69 Ohm
Implantable Lead Implant Date: 20150421
Implantable Lead Implant Date: 20150421
Implantable Lead Implant Date: 20150421
Implantable Lead Location: 753859
Implantable Lead Location: 753860
Lead Channel Impedance Value: 460 Ohm
Lead Channel Impedance Value: 850 Ohm
Lead Channel Pacing Threshold Amplitude: 0.5 V
Lead Channel Pacing Threshold Amplitude: 0.75 V
Lead Channel Pacing Threshold Pulse Width: 0.5 ms
Lead Channel Sensing Intrinsic Amplitude: 5.2 mV
Lead Channel Setting Pacing Amplitude: 2 V
Lead Channel Setting Pacing Amplitude: 2 V
Lead Channel Setting Sensing Sensitivity: 0.5 mV
MDC IDC LEAD LOCATION: 753857
MDC IDC MSMT LEADCHNL LV PACING THRESHOLD PULSEWIDTH: 0.5 ms
MDC IDC MSMT LEADCHNL RA IMPEDANCE VALUE: 400 Ohm
MDC IDC MSMT LEADCHNL RA PACING THRESHOLD AMPLITUDE: 0.625 V
MDC IDC MSMT LEADCHNL RA SENSING INTR AMPL: 3.9 mV
MDC IDC MSMT LEADCHNL RV PACING THRESHOLD PULSEWIDTH: 0.5 ms
MDC IDC PG SERIAL: 7157119
MDC IDC SESS DTM: 20170427051526
MDC IDC SET LEADCHNL LV PACING PULSEWIDTH: 0.5 ms
MDC IDC SET LEADCHNL RV PACING AMPLITUDE: 2 V
MDC IDC SET LEADCHNL RV PACING PULSEWIDTH: 0.5 ms
MDC IDC STAT BRADY AP VP PERCENT: 4.1 %
MDC IDC STAT BRADY AP VS PERCENT: 1 %
MDC IDC STAT BRADY AS VP PERCENT: 94 %
MDC IDC STAT BRADY AS VS PERCENT: 1.4 %
MDC IDC STAT BRADY RA PERCENT PACED: 3.8 %

## 2015-12-05 ENCOUNTER — Telehealth: Payer: Self-pay | Admitting: Cardiology

## 2015-12-05 NOTE — Telephone Encounter (Signed)
LMOVM requesting that pt send manual transmission b/c home monitor has not updated in at least 8 days.   

## 2015-12-24 ENCOUNTER — Encounter: Payer: Self-pay | Admitting: *Deleted

## 2016-01-07 ENCOUNTER — Encounter: Payer: Self-pay | Admitting: Internal Medicine

## 2016-01-07 ENCOUNTER — Ambulatory Visit (INDEPENDENT_AMBULATORY_CARE_PROVIDER_SITE_OTHER): Payer: BLUE CROSS/BLUE SHIELD | Admitting: Internal Medicine

## 2016-01-07 VITALS — BP 136/82 | HR 79 | Ht 73.0 in | Wt 340.8 lb

## 2016-01-07 DIAGNOSIS — I5022 Chronic systolic (congestive) heart failure: Secondary | ICD-10-CM

## 2016-01-07 DIAGNOSIS — I429 Cardiomyopathy, unspecified: Secondary | ICD-10-CM | POA: Diagnosis not present

## 2016-01-07 DIAGNOSIS — I428 Other cardiomyopathies: Secondary | ICD-10-CM

## 2016-01-07 NOTE — Progress Notes (Signed)
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 male who presents today for routine electrophysiology followup.  Since his last visit, the patient reports doing very well. His exercise tolerance is preserved.  He is walking about a mile per day with his spouse without limitation.  Weight loss has been a challenge.  He works in [place] and travels frequently.  Today, he denies symptoms of palpitations, chest pain, shortness of breath,  lower extremity edema, dizziness, presyncope, syncope, or ICD shocks.  The patient is otherwise without complaint today.   Past Medical History:  Diagnosis Date  . Acute idiopathic pericarditis 03/24/2010   Overview:  Viral Idiopathic Pericarditis   . Chronic systolic heart failure ([place])   . Hypertension   . LBBB (left bundle branch block)    Chronic  . NICM (nonischemic cardiomyopathy) ([place])    Probably nonischemic; LHC in 2002/03 no sig CAD per report (done in [place]). Cardiomyopathy for 9-10 yrs. 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 Echo 5/11: severe LVE, EF <20%, diff HK, mod dias dys, sev. LAE. Hosp for CHF in 5/11. Ad. MV 6/11: EF 21%, mild per-infart ischemia. LHC (7/11): EF 15%, no CAD, LVEDP 22; Echo (10/11): EF 20-25%;  Echo (12/12):  EF 20%, mod LVH, Gr 1 DD, diff HK, mild LAE  . OSA (obstructive sleep apnea)    cpap used  . Pure hypercholesterolemia   . Severe obesity ([place])    Past Surgical History:  Procedure Laterality Date  . BI-VENTRICULAR IMPLANTABLE CARDIOVERTER DEFIBRILLATOR N/A 08/21/2013   Procedure: BI-VENTRICULAR IMPLANTABLE CARDIOVERTER DEFIBRILLATOR  (CRT-D);  Surgeon: Coralyn Mark, MD;  Location: [facility] CATH LAB;  Service: Cardiovascular;  Laterality: N/A;  . CARDIAC CATHETERIZATION  2002 or 2003  . COLONOSCOPY N/A 09/16/2014   Procedure: COLONOSCOPY;  Surgeon: [name], MD;  Location: [facility] ENDOSCOPY;  Service: Endoscopy;  Laterality: N/A;  . LEFT HEART CATH  11/13/09   Dr [name]    ROS- all  systems are reviewed and negative except as per HPI above  Current Outpatient Prescriptions  Medication Sig Dispense Refill  . aspirin 81 MG EC tablet Take 1 tablet (81 mg total) by mouth daily. 30 tablet 2  . carvedilol (COREG) 25 MG tablet TAKE 1 BY MOUTH TWICE DAILY 180 tablet 0  . fish oil-omega-3 fatty acids 1000 MG capsule Take 1 capsule (1 g total) by mouth daily. 30 capsule 2  . furosemide (LASIX) 40 MG tablet Take 1 tablet (40 mg total) by mouth daily. 90 tablet 2  . Multiple Vitamin (MULTIVITAMIN) tablet Take 1 tablet by mouth daily.      . sacubitril-valsartan (ENTRESTO) 97-103 MG Take 1 tablet by mouth 2 (two) times daily. 180 tablet 3  . simvastatin (ZOCOR) 40 MG tablet Take 1 tablet (40 mg total) by mouth every evening. 90 tablet 3  . spironolactone (ALDACTONE) 25 MG tablet Take 1 tablet (25 mg total) by mouth daily. 90 tablet 3   No current facility-administered medications for this visit.     Physical Exam: Vitals:   01/07/16 1029  BP: 136/82  Pulse: 79  Weight: (!) 340 lb 12.8 oz (154.6 kg)  Height: 6\' 1"  (1.854 m)    GEN- The patient is well appearing, alert and oriented x 3 today.   Head- normocephalic, atraumatic Eyes-  Sclera clear, conjunctiva pink Ears- hearing intact Oropharynx- clear Lungs- Clear to ausculation bilaterally, normal work of breathing Chest- ICD pocket is well healed Heart-  Regular rate and rhythm, no murmurs, rubs or gallops, PMI not laterally displaced GI- soft, NT, ND, + BS Extremities- no clubbing, cyanosis, or edema  ICD interrogation- reviewed in detail today,  See PACEART report ekg today reveals sinus rhythm with BiV pacing  Assessment and Plan:  1.  Chronic systolic dysfunction euvolemic today Stable on an appropriate medical regimen Normal BiV ICD function See Pace Art report No changes today  2. Morbid obesity Body mass index is 44.96 kg/m. Weight loss advised  Will enroll in ICM device clinic with Carmela Rima Return to see me in 1 year  [name] MD, [facility] 01/07/2016 11:09 AM

## 2016-01-07 NOTE — Progress Notes (Signed)
Referred to ICM clinic by Dr [name].  Met patient in office and explained ICM program and he agreed to monthly calls.  He travels some and advised to call if scheduled date of ICM remote transmission 02/10/2016 does not work for him. Advised to call for any fluid symptoms and provided phone number.  He gave verbal permission to leave detailed message on home and cell phone number.

## 2016-01-07 NOTE — Patient Instructions (Signed)
Medication Instructions:  Your physician recommends that you continue on your current medications as directed. Please refer to the Current Medication list given to you today.   Labwork: None ordered  Testing/Procedures: None ordered  Follow-Up: Your physician wants you to follow-up in: 12 months with Dr. [name]. You will receive a reminder letter in the mail two months in advance. If you don't receive a letter, please call our office to schedule the follow-up appointment.  Remote monitoring is used to monitor your ICD from home. This monitoring reduces the number of office visits required to check your device to one time per year. It allows [name] to keep an eye on the functioning of your device to ensure it is working properly. You are scheduled for a device check from home on 04/07/16. You may send your transmission at any time that day. If you have a wireless device, the transmission will be sent automatically. After your physician reviews your transmission, you will receive a postcard with your next transmission date.  Sharman Cheek, RN will follow you monthly for fluid levels.     If you need a refill on your cardiac medications before your next appointment, please call your pharmacy.

## 2016-01-08 LAB — CUP PACEART INCLINIC DEVICE CHECK
Battery Remaining Percentage: 65 %
Date Time Interrogation Session: 20170907162602
Implantable Lead Implant Date: 20150421
Implantable Lead Implant Date: 20150421
Lead Channel Pacing Threshold Amplitude: 0.75 V
Lead Channel Pacing Threshold Amplitude: 0.75 V
Lead Channel Pacing Threshold Amplitude: 0.75 V
Lead Channel Pacing Threshold Pulse Width: 0.5 ms
Lead Channel Sensing Intrinsic Amplitude: 11.9 mV
MDC IDC LEAD IMPLANT DT: 20150421
MDC IDC LEAD LOCATION: 753857
MDC IDC LEAD LOCATION: 753859
MDC IDC LEAD LOCATION: 753860
MDC IDC MSMT BATTERY REMAINING LONGEVITY: 56 mo
MDC IDC MSMT LEADCHNL RA PACING THRESHOLD PULSEWIDTH: 0.5 ms
MDC IDC MSMT LEADCHNL RA SENSING INTR AMPL: 4.1 mV
MDC IDC MSMT LEADCHNL RV PACING THRESHOLD PULSEWIDTH: 0.5 ms
MDC IDC PG SERIAL: 7157119

## 2016-02-10 ENCOUNTER — Ambulatory Visit (INDEPENDENT_AMBULATORY_CARE_PROVIDER_SITE_OTHER): Payer: BLUE CROSS/BLUE SHIELD

## 2016-02-10 ENCOUNTER — Telehealth: Payer: Self-pay

## 2016-02-10 DIAGNOSIS — I5022 Chronic systolic (congestive) heart failure: Secondary | ICD-10-CM | POA: Diagnosis not present

## 2016-02-10 DIAGNOSIS — Z95810 Presence of automatic (implantable) cardiac defibrillator: Secondary | ICD-10-CM

## 2016-02-10 NOTE — Telephone Encounter (Signed)
Remote ICM transmission received.  Attempted patient call and left detailed message regarding transmission and next ICM scheduled for 03/19/2016.  Advised to return call for any fluid symptoms or questions.

## 2016-02-10 NOTE — Progress Notes (Signed)
EPIC Encounter for ICM Monitoring  Patient Name: Gary Williamson is a 50 y.o. male Date: 02/10/2016 Primary Care Physican: Harrison Mons, PA-C Primary Cardiologist: [name] Electrophysiologist: [name] Dry Weight:  unknown Bi-V Pacing:  98%       Attempted 1st ICM call and unable to reach.  Transmission reviewed.   Thoracic impedance normal.    Follow-up plan: ICM clinic phone appointment on 03/19/2016.  Copy of ICM check sent to device physician.   ICM trend: 02/10/2016       [name], RN 02/10/2016 3:06 PM

## 2016-02-24 ENCOUNTER — Encounter: Payer: Self-pay | Admitting: Family Medicine

## 2016-02-24 ENCOUNTER — Ambulatory Visit (INDEPENDENT_AMBULATORY_CARE_PROVIDER_SITE_OTHER): Payer: BLUE CROSS/BLUE SHIELD | Admitting: Family Medicine

## 2016-02-24 VITALS — BP 132/86 | HR 87 | Temp 97.8°F | Resp 18 | Ht 73.0 in | Wt 342.6 lb

## 2016-02-24 DIAGNOSIS — G4733 Obstructive sleep apnea (adult) (pediatric): Secondary | ICD-10-CM

## 2016-02-24 DIAGNOSIS — L989 Disorder of the skin and subcutaneous tissue, unspecified: Secondary | ICD-10-CM | POA: Diagnosis not present

## 2016-02-24 DIAGNOSIS — E7800 Pure hypercholesterolemia, unspecified: Secondary | ICD-10-CM

## 2016-02-24 DIAGNOSIS — I5022 Chronic systolic (congestive) heart failure: Secondary | ICD-10-CM

## 2016-02-24 DIAGNOSIS — E559 Vitamin D deficiency, unspecified: Secondary | ICD-10-CM

## 2016-02-24 DIAGNOSIS — R7302 Impaired glucose tolerance (oral): Secondary | ICD-10-CM | POA: Diagnosis not present

## 2016-02-24 DIAGNOSIS — I10 Essential (primary) hypertension: Secondary | ICD-10-CM

## 2016-02-24 LAB — COMPREHENSIVE METABOLIC PANEL
ALBUMIN: 3.5 g/dL — AB (ref 3.6–5.1)
ALT: 18 U/L (ref 9–46)
AST: 17 U/L (ref 10–35)
Alkaline Phosphatase: 59 U/L (ref 40–115)
BILIRUBIN TOTAL: 0.4 mg/dL (ref 0.2–1.2)
BUN: 14 mg/dL (ref 7–25)
CHLORIDE: 107 mmol/L (ref 98–110)
CO2: 23 mmol/L (ref 20–31)
CREATININE: 1.06 mg/dL (ref 0.70–1.33)
Calcium: 8.8 mg/dL (ref 8.6–10.3)
GLUCOSE: 119 mg/dL — AB (ref 65–99)
Potassium: 4 mmol/L (ref 3.5–5.3)
SODIUM: 139 mmol/L (ref 135–146)
Total Protein: 6 g/dL — ABNORMAL LOW (ref 6.1–8.1)

## 2016-02-24 LAB — CBC WITH DIFFERENTIAL/PLATELET
Basophils Absolute: 0 cells/uL (ref 0–200)
Basophils Relative: 0 %
EOS PCT: 2 %
Eosinophils Absolute: 158 cells/uL (ref 15–500)
HCT: 39.1 % (ref 38.5–50.0)
HEMOGLOBIN: 13.4 g/dL (ref 13.2–17.1)
LYMPHS ABS: 1580 {cells}/uL (ref 850–3900)
Lymphocytes Relative: 20 %
MCH: 30 pg (ref 27.0–33.0)
MCHC: 34.3 g/dL (ref 32.0–36.0)
MCV: 87.7 fL (ref 80.0–100.0)
MONOS PCT: 9 %
MPV: 10.1 fL (ref 7.5–12.5)
Monocytes Absolute: 711 cells/uL (ref 200–950)
NEUTROS PCT: 69 %
Neutro Abs: 5451 cells/uL (ref 1500–7800)
PLATELETS: 183 10*3/uL (ref 140–400)
RBC: 4.46 MIL/uL (ref 4.20–5.80)
RDW: 14.7 % (ref 11.0–15.0)
WBC: 7.9 10*3/uL (ref 3.8–10.8)

## 2016-02-24 LAB — LIPID PANEL
Cholesterol: 165 mg/dL (ref 125–200)
HDL: 30 mg/dL — ABNORMAL LOW (ref >=40)
LDL CALC: 111 mg/dL (ref <130)
Total CHOL/HDL Ratio: 5.5 Ratio — ABNORMAL HIGH (ref <=5.0)
Triglycerides: 120 mg/dL (ref <150)
VLDL: 24 mg/dL (ref <30)

## 2016-02-24 MED ORDER — FUROSEMIDE 40 MG PO TABS: 40.0000 mg | ORAL_TABLET | Freq: Every day | ORAL | 3 refills | Status: DC

## 2016-02-24 MED ORDER — SPIRONOLACTONE 25 MG PO TABS: 25.0000 mg | ORAL_TABLET | Freq: Every day | ORAL | 3 refills | Status: DC

## 2016-02-24 MED ORDER — CLOBETASOL PROPIONATE 0.05 % EX OINT: 1.0000 "application " | TOPICAL_OINTMENT | Freq: Two times a day (BID) | CUTANEOUS | 0 refills | Status: DC

## 2016-02-24 MED ORDER — CARVEDILOL 25 MG PO TABS: ORAL_TABLET | ORAL | 3 refills | Status: DC

## 2016-02-24 NOTE — Progress Notes (Signed)
Subjective:    Patient ID: Gary Williamson, male    DOB: [birth year], 50 y.o.   MRN: [id number]  02/24/2016  Follow-up (6 month follow up)   HPI This 50 y.o. male presents for six month follow-up of HTN, CHF systolic with defibrillator, glucose intolerance, hypercholesterolemia, and vitamin D deficiency.  Traveling too much with work; in [place] all week last week.  Not checking BP at home; at [facility], checks BP 130s/80s.  Avoiding soft drinks; trying to lose weight.  Joined [employer] for three weeks ago; no progress yet.  Tracking is the issue.  Wife wants patient to get a trainer; cross fit guy; had mass on lung that had treated. Never started Vitamin D supplement after last visit.  Followed by cardiology every six months; followed by debrillator clinic also.   Wt Readings from Last 3 Encounters:  02/24/16 (!) 342 lb 9.6 oz (155.4 kg)  01/07/16 (!) 340 lb 12.8 oz (154.6 kg)  08/21/15 (!) 327 lb (148.3 kg)    BP Readings from Last 3 Encounters:  02/24/16 132/86  01/07/16 136/82  08/21/15 124/80   Lab Results  Component Value Date   CHOL 180 08/21/2015   CHOL 158 08/28/2014   CHOL 161 03/25/2014   Lab Results  Component Value Date   HDL 29 (L) 08/21/2015   HDL 30 (L) 08/28/2014   HDL 31 (L) 03/25/2014   Lab Results  Component Value Date   LDLCALC 119 08/21/2015   LDLCALC 101 (H) 08/28/2014   LDLCALC 109 (H) 03/25/2014   Lab Results  Component Value Date   TRIG 162 (H) 08/21/2015   TRIG 133 08/28/2014   TRIG 104 03/25/2014   Lab Results  Component Value Date   CHOLHDL 6.2 (H) 08/21/2015   CHOLHDL 5.3 08/28/2014   CHOLHDL 5.2 03/25/2014   No results found for: LDLDIRECT   R posterior skin lesion: thinks due to barber issues; scraping skin off; wife recommends leaving it alone.  Has something on it.  Scab.     Review of Systems  Constitutional: Negative for activity change, appetite change, chills, diaphoresis, fatigue and fever.  Respiratory: Negative for cough and  shortness of breath.   Cardiovascular: Negative for chest pain, palpitations and leg swelling.  Gastrointestinal: Negative for abdominal pain, diarrhea, nausea and vomiting.  Endocrine: Negative for cold intolerance, heat intolerance, polydipsia, polyphagia and polyuria.  Skin: Negative for color change, rash and wound.  Neurological: Negative for dizziness, tremors, seizures, syncope, facial asymmetry, speech difficulty, weakness, light-headedness, numbness and headaches.  Psychiatric/Behavioral: Negative for dysphoric mood and sleep disturbance. The patient is not nervous/anxious.     Past Medical History:  Diagnosis Date  . Acute idiopathic pericarditis 03/24/2010   Overview:  Viral Idiopathic Pericarditis   . Chronic systolic heart failure ([place])   . Hypertension   . LBBB (left bundle branch block)    Chronic  . NICM (nonischemic cardiomyopathy) ([place])    Probably nonischemic; LHC in 2002/03 no sig CAD per report (done in [facility]). Cardiomyopathy for 9-10 yrs. 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 Echo 5/11: severe LVE, EF <20%, diff HK, mod dias dys, sev. LAE. Hosp for CHF in 5/11. Ad. MV 6/11: EF 21%, mild per-infart ischemia. LHC (7/11): EF 15%, no CAD, LVEDP 22; Echo (10/11): EF 20-25%;  Echo (12/12):  EF 20%, mod LVH, Gr 1 DD, diff HK, mild LAE  . OSA (obstructive sleep apnea)    cpap used  . Pure hypercholesterolemia   . Severe obesity (  [facility])    Past Surgical History:  Procedure Laterality Date  . BI-VENTRICULAR IMPLANTABLE CARDIOVERTER DEFIBRILLATOR N/A 08/21/2013   Procedure: BI-VENTRICULAR IMPLANTABLE CARDIOVERTER DEFIBRILLATOR  (CRT-D);  Surgeon: Coralyn Mark, MD;  Location[facility] C[facility] LAB;  Service: Cardiovascular;  Laterality: N/A;  . CARDIAC CATHETERIZATION  2002 or 2003  . COLONOSCOPY N/A 09/16/2014   Procedure: COLONOSCOPY;  Surgeon: [name], MD;  Location: [facility] ENDOSCOPY;  Service: Endoscopy;  Laterality: N/A;  . LEFT HEART CATH  11/13/09   Dr [name]   No Known  Allergies Current Outpatient Prescriptions  Medication Sig Dispense Refill  . aspirin 81 MG EC tablet Take 1 tablet (81 mg total) by mouth daily. 30 tablet 2  . carvedilol (COREG) 25 MG tablet TAKE 1 BY MOUTH TWICE DAILY 180 tablet 3  . fish oil-omega-3 fatty acids 1000 MG capsule Take 1 capsule (1 g total) by mouth daily. 30 capsule 2  . furosemide (LASIX) 40 MG tablet Take 1 tablet (40 mg total) by mouth daily. 90 tablet 3  . Multiple Vitamin (MULTIVITAMIN) tablet Take 1 tablet by mouth daily.      . sacubitril-valsartan (ENTRESTO) 97-103 MG Take 1 tablet by mouth 2 (two) times daily. 180 tablet 3  . simvastatin (ZOCOR) 40 MG tablet Take 1 tablet (40 mg total) by mouth every evening. 90 tablet 3  . spironolactone (ALDACTONE) 25 MG tablet Take 1 tablet (25 mg total) by mouth daily. 90 tablet 3  . clobetasol ointment (TEMOVATE) [id number] % Apply 1 application topically 2 (two) times daily. 30 g 0   No current facility-administered medications for this visit.    Social History   Social History  . Marital status: Married    Spouse name: [name]  . Number of children: 2  . Years of education: N/A   Occupational History  . Mudlogger of Programmer, applications for [employer]  .  [place]   Social History Main Topics  . Smoking status: Never Smoker  . Smokeless tobacco: Never Used  . Alcohol use 0.0 - 1.0 oz/week     Comment: rare to occ.  . Drug use: No  . Sexual activity: Yes    Birth control/ protection: Condom   Other Topics Concern  . Not on file   Social History Narrative   Marital status: married x 16 years; happily married      Children: 2 children (13,10)      Lives in [place] with his wife and 2 children      Employment: [employer]; Solicitor x 10 years; likes work.  Lots of traveling.       Tobacco: none      Alcohol:  1 drink per week   Regular diet      Exercise:  Walking three days per week; 20 minutes.       Seatbelt: 100%      Family History  Problem  Relation Age of Onset  . Hypertension Mother   . Alzheimer's disease Mother   . Hyperlipidemia Mother   . Breast cancer Mother   . Cancer Mother     breast cancer  . Stroke Maternal Grandmother   . Arthritis Father   . Dementia Father   . Hypertension Father   . Alzheimer's disease Father   . COPD Brother   . Coronary artery disease Neg Hx   . Heart failure Neg Hx   . Heart disease Neg Hx        Objective:  BP 132/86   Pulse 87   Temp 97.8 F (36.6 C) (Oral)   Resp 18   Ht 6\' 1"  (1.854 m)   Wt (!) 342 lb 9.6 oz (155.4 kg)   SpO2 98%   BMI 45.20 kg/m  Physical Exam  Constitutional: He is oriented to person, place, and time. He appears well-developed and well-nourished. No distress.  HENT:  Head: Normocephalic and atraumatic.  Right Ear: External ear normal.  Left Ear: External ear normal.  Nose: Nose normal.  Mouth/Throat: Oropharynx is clear and moist.  Eyes: Conjunctivae and EOM are normal. Pupils are equal, round, and reactive to light.  Neck: Normal range of motion. Neck supple. Carotid bruit is not present. No thyromegaly present.  Cardiovascular: Normal rate, regular rhythm, normal heart sounds and intact distal pulses.  Exam reveals no gallop and no friction rub.   No murmur heard. Pulmonary/Chest: Effort normal and breath sounds normal. He has no wheezes. He has no rales.  Abdominal: Soft. Bowel sounds are normal. He exhibits no distension and no mass. There is no tenderness. There is no rebound and no guarding.  Lymphadenopathy:    He has no cervical adenopathy.  Neurological: He is alert and oriented to person, place, and time. No cranial nerve deficit.  Skin: Skin is warm and dry. No rash noted. He is not diaphoretic.  Psychiatric: He has a normal mood and affect. His behavior is normal.  Nursing note and vitals reviewed.  Results for orders placed or performed in visit on 01/07/16  Implantable device check  Result Value Ref Range   Pulse Generator  Manufacturer [employer]    Date Time Interrogation Session [id number]    Pulse Gen Model 3365-40Q Quadra Assura    Pulse Gen Serial Number [id number]    Implantable Pulse Generator Type Cardiac Resynch Therapy Defibulator    Implantable Pulse Generator Implant Date [id number]    Implantable Lead Manufacturer [facility]    Implantable Lead Model [id number]    Implantable Lead Serial Number [id number]    Implantable Lead Implant Date [id number]    Implantable Lead Location [id number]    Implantable Lead Manufacturer [facility]    Implantable Lead Model 1458Q Quartet    Implantable Lead Serial Number [id number]    Implantable Lead Implant Date [id number]    Implantable Lead Location [id number]    Implantable Lead Manufacturer [facility]    Implantable Lead Model [id number] Tendril STS    Implantable Lead Serial Number [id number]    Implantable Lead Implant Date AD:427113    Implantable Lead Location [id number]    Lead Channel Sensing Intrinsic Amplitude 4.1 mV   Lead Channel Pacing Threshold Amplitude 0.75 V   Lead Channel Pacing Threshold Pulse Width 0.5 ms   Lead Channel Sensing Intrinsic Amplitude 11.9 mV   Lead Channel Pacing Threshold Amplitude 0.75 V   Lead Channel Pacing Threshold Pulse Width 0.5 ms   Lead Channel Pacing Threshold Amplitude 0.75 V   Lead Channel Pacing Threshold Pulse Width 0.5 ms   Battery Remaining Longevity 56 mo   Battery Remaining Percentage 65 %   Eval Rhythm AS,VS @ 75        Assessment & Plan:   1. Chronic systolic heart failure ([place])   2. Essential hypertension   3. OSA (obstructive sleep apnea)   4. Pure hypercholesterolemia   5. Severe obesity (BMI >= 40) ([facility])   6. Skin lesion   7. Avitaminosis D   8. Glucose intolerance (impaired glucose tolerance)    -controlled;  obtain labs; continue current medications. -recommend exercise, weight loss, and low-calorie food choices.   Orders Placed This Encounter  Procedures  . CBC with Differential/Platelet  . Comprehensive metabolic  panel    Order Specific Question:   Has the patient fasted?    Answer:   Yes  . Lipid panel    Order Specific Question:   Has the patient fasted?    Answer:   Yes  . VITAMIN D 25 Hydroxy (Vit-D Deficiency, Fractures)  . Hemoglobin A1c   Meds ordered this encounter  Medications  . clobetasol ointment (TEMOVATE) 0.05 %    Sig: Apply 1 application topically 2 (two) times daily.    Dispense:  30 g    Refill:  0  . carvedilol (COREG) 25 MG tablet    Sig: TAKE 1 BY MOUTH TWICE DAILY    Dispense:  180 tablet    Refill:  3  . furosemide (LASIX) 40 MG tablet    Sig: Take 1 tablet (40 mg total) by mouth daily.    Dispense:  90 tablet    Refill:  3  . spironolactone (ALDACTONE) 25 MG tablet    Sig: Take 1 tablet (25 mg total) by mouth daily.    Dispense:  90 tablet    Refill:  3    Return in about 6 months (around 08/24/2016) for complete physical examiniation.   Shameria Trimarco Elayne Guerin, M.D. [facility] [address] [place], [place]  [address] [phone] phone [phone] fax

## 2016-02-24 NOTE — Patient Instructions (Addendum)
   IF you received an x-ray today, you will receive an invoice from [facility]. Please contact [facility] at [phone] with questions or concerns regarding your invoice.   IF you received labwork today, you will receive an invoice from [facility]. Please contact [facility] at [phone] with questions or concerns regarding your invoice.   Our billing staff will not be able to assist you with questions regarding bills from these companies.  You will be contacted with the lab results as soon as they are available. The fastest way to get your results is to activate your My Chart account. Instructions are located on the last page of this paperwork. If you have not heard from us regarding the results in 2 weeks, please contact this office.     DASH Eating Plan DASH stands for "Dietary Approaches to Stop Hypertension." The DASH eating plan is a healthy eating plan that has been shown to reduce high blood pressure (hypertension). Additional health benefits may include reducing the risk of type 2 diabetes mellitus, heart disease, and stroke. The DASH eating plan may also help with weight loss. WHAT DO I NEED TO KNOW ABOUT THE DASH EATING PLAN? For the DASH eating plan, you will follow these general guidelines:  Choose foods with a percent daily value for sodium of less than 5% (as listed on the food label).  Use salt-free seasonings or herbs instead of table salt or sea salt.  Check with your health care provider or pharmacist before using salt substitutes.  Eat lower-sodium products, often labeled as "lower sodium" or "no salt added."  Eat fresh foods.  Eat more vegetables, fruits, and low-fat dairy products.  Choose whole grains. Look for the word "whole" as the first word in the ingredient list.  Choose fish and skinless chicken or turkey more often than red meat. Limit fish, poultry, and meat to 6 oz (170 g) each day.  Limit sweets,  desserts, sugars, and sugary drinks.  Choose heart-healthy fats.  Limit cheese to 1 oz (28 g) per day.  Eat more home-cooked food and less restaurant, buffet, and fast food.  Limit fried foods.  Cook foods using methods other than frying.  Limit canned vegetables. If you do use them, rinse them well to decrease the sodium.  When eating at a restaurant, ask that your food be prepared with less salt, or no salt if possible. WHAT FOODS CAN I EAT? Seek help from a dietitian for individual calorie needs. Grains Whole grain or whole wheat bread. Brown rice. Whole grain or whole wheat pasta. Quinoa, bulgur, and whole grain cereals. Low-sodium cereals. Corn or whole wheat flour tortillas. Whole grain cornbread. Whole grain crackers. Low-sodium crackers. Vegetables Fresh or frozen vegetables (raw, steamed, roasted, or grilled). Low-sodium or reduced-sodium tomato and vegetable juices. Low-sodium or reduced-sodium tomato sauce and paste. Low-sodium or reduced-sodium canned vegetables.  Fruits All fresh, canned (in natural juice), or frozen fruits. Meat and Other Protein Products Ground beef (85% or leaner), grass-fed beef, or beef trimmed of fat. Skinless chicken or turkey. Ground chicken or turkey. Pork trimmed of fat. All fish and seafood. Eggs. Dried beans, peas, or lentils. Unsalted nuts and seeds. Unsalted canned beans. Dairy Low-fat dairy products, such as skim or 1% milk, 2% or reduced-fat cheeses, low-fat ricotta or cottage cheese, or plain low-fat yogurt. Low-sodium or reduced-sodium cheeses. Fats and Oils Tub margarines without trans fats. Light or reduced-fat mayonnaise and salad dressings (reduced sodium). Avocado. Safflower, olive, or canola   oils. Natural peanut or almond butter. Other Unsalted popcorn and pretzels. The items listed above may not be a complete list of recommended foods or beverages. Contact your dietitian for more options. WHAT FOODS ARE NOT  RECOMMENDED? Grains White bread. White pasta. White rice. Refined cornbread. Bagels and croissants. Crackers that contain trans fat. Vegetables Creamed or fried vegetables. Vegetables in a cheese sauce. Regular canned vegetables. Regular canned tomato sauce and paste. Regular tomato and vegetable juices. Fruits Dried fruits. Canned fruit in light or heavy syrup. Fruit juice. Meat and Other Protein Products Fatty cuts of meat. Ribs, chicken wings, bacon, sausage, bologna, salami, chitterlings, fatback, hot dogs, bratwurst, and packaged luncheon meats. Salted nuts and seeds. Canned beans with salt. Dairy Whole or 2% milk, cream, half-and-half, and cream cheese. Whole-fat or sweetened yogurt. Full-fat cheeses or blue cheese. Nondairy creamers and whipped toppings. Processed cheese, cheese spreads, or cheese curds. Condiments Onion and garlic salt, seasoned salt, table salt, and sea salt. Canned and packaged gravies. Worcestershire sauce. Tartar sauce. Barbecue sauce. Teriyaki sauce. Soy sauce, including reduced sodium. Steak sauce. Fish sauce. Oyster sauce. Cocktail sauce. Horseradish. Ketchup and mustard. Meat flavorings and tenderizers. Bouillon cubes. Hot sauce. Tabasco sauce. Marinades. Taco seasonings. Relishes. Fats and Oils Butter, stick margarine, lard, shortening, ghee, and bacon fat. Coconut, palm kernel, or palm oils. Regular salad dressings. Other Pickles and olives. Salted popcorn and pretzels. The items listed above may not be a complete list of foods and beverages to avoid. Contact your dietitian for more information. WHERE CAN I FIND MORE INFORMATION? National Heart, Lung, and Blood Institute: www.nhlbi.nih.gov/health/health-topics/topics/dash/   This information is not intended to replace advice given to you by your health care provider. Make sure you discuss any questions you have with your health care provider.   Document Released: 04/08/2011 Document Revised: 05/10/2014  Document Reviewed: 02/21/2013 Elsevier Interactive Patient Education 2016 Elsevier Inc.  

## 2016-02-25 LAB — VITAMIN D 25 HYDROXY (VIT D DEFICIENCY, FRACTURES): VIT D 25 HYDROXY: 16 ng/mL — AB (ref 30–100)

## 2016-02-25 LAB — HEMOGLOBIN A1C
Hgb A1c MFr Bld: 5.8 % — ABNORMAL HIGH (ref <5.7)
MEAN PLASMA GLUCOSE: 120 mg/dL

## 2016-03-11 ENCOUNTER — Telehealth: Payer: Self-pay | Admitting: Cardiology

## 2016-03-11 NOTE — Telephone Encounter (Signed)
LMOVM requesting that pt send manual transmission b/c home monitor has not updated in at least 7 days.    

## 2016-03-18 ENCOUNTER — Telehealth: Payer: Self-pay | Admitting: Cardiology

## 2016-03-18 NOTE — Telephone Encounter (Signed)
LMOVM requesting that pt send manual transmission b/c home monitor has not updated in at least 7 days.    

## 2016-03-19 ENCOUNTER — Telehealth: Payer: Self-pay | Admitting: Cardiology

## 2016-03-19 NOTE — Telephone Encounter (Signed)
LMOVM reminding pt to send remote transmission.   

## 2016-03-31 ENCOUNTER — Telehealth (HOSPITAL_COMMUNITY): Payer: Self-pay | Admitting: Pharmacist

## 2016-03-31 NOTE — Telephone Encounter (Signed)
Entresto 97-103 mg BID PA approved by [employer] through 04/09/17.   Ruta Hinds. Velva Harman, PharmD, BCPS, CPP Clinical Pharmacist Pager: [phone] Phone: [phone] 03/31/2016 12:04 PM

## 2016-04-05 NOTE — Progress Notes (Signed)
No ICM remote transmission received on 03/19/2016.   Next ICM transmission scheduled for 04/07/2016.

## 2016-04-07 ENCOUNTER — Encounter: Payer: BLUE CROSS/BLUE SHIELD | Admitting: *Deleted

## 2016-04-07 ENCOUNTER — Telehealth: Payer: Self-pay | Admitting: Cardiology

## 2016-04-07 NOTE — Telephone Encounter (Signed)
LMOVM reminding pt to send remote transmission.   

## 2016-04-12 ENCOUNTER — Telehealth: Payer: Self-pay

## 2016-04-12 NOTE — Telephone Encounter (Addendum)
Attempted ICM call and left message to return call.  Requested to send remote transmission.  Next ICM remote transmission scheduled for 05/07/2016

## 2016-04-13 ENCOUNTER — Telehealth (HOSPITAL_COMMUNITY): Payer: Self-pay | Admitting: Vascular Surgery

## 2016-04-13 NOTE — Telephone Encounter (Signed)
Left pt message to make f/u appt W/ md

## 2016-04-16 ENCOUNTER — Encounter: Payer: Self-pay | Admitting: Cardiology

## 2016-04-23 ENCOUNTER — Ambulatory Visit (INDEPENDENT_AMBULATORY_CARE_PROVIDER_SITE_OTHER): Payer: BLUE CROSS/BLUE SHIELD | Admitting: *Deleted

## 2016-04-23 ENCOUNTER — Telehealth: Payer: Self-pay | Admitting: Internal Medicine

## 2016-04-23 DIAGNOSIS — I428 Other cardiomyopathies: Secondary | ICD-10-CM | POA: Diagnosis not present

## 2016-04-23 NOTE — Telephone Encounter (Signed)
Spoke w/ pt and informed him that his remote transmission was received.  

## 2016-04-23 NOTE — Progress Notes (Signed)
Remote ICD transmission.   

## 2016-04-23 NOTE — Telephone Encounter (Signed)
NEW MESSAGE  PT CALL REQUESTING TO SPEAK WITH RN TO SEE IF TRANSMISSION WAS RECEIVED. PLEASE CALL BACK TO DISCUSS

## 2016-04-28 ENCOUNTER — Encounter: Payer: Self-pay | Admitting: Cardiology

## 2016-04-29 ENCOUNTER — Telehealth (HOSPITAL_COMMUNITY): Payer: Self-pay | Admitting: Vascular Surgery

## 2016-04-29 ENCOUNTER — Encounter (HOSPITAL_COMMUNITY): Payer: Self-pay | Admitting: Vascular Surgery

## 2016-04-29 NOTE — Telephone Encounter (Signed)
Can not reach pr , sent letter

## 2016-04-30 LAB — CUP PACEART REMOTE DEVICE CHECK
Battery Remaining Percentage: 62 %
Battery Voltage: 2.95 V
Brady Statistic AP VP Percent: 3.5 %
Brady Statistic AS VP Percent: 95 %
Brady Statistic RA Percent Paced: 3.2 %
HighPow Impedance: 71 Ohm
HighPow Impedance: 71 Ohm
Implantable Lead Implant Date: 20150421
Implantable Lead Implant Date: 20150421
Implantable Lead Location: 753857
Implantable Lead Location: 753859
Lead Channel Impedance Value: 400 Ohm
Lead Channel Impedance Value: 480 Ohm
Lead Channel Impedance Value: 830 Ohm
Lead Channel Pacing Threshold Amplitude: 0.5 V
Lead Channel Pacing Threshold Pulse Width: 0.5 ms
Lead Channel Pacing Threshold Pulse Width: 0.5 ms
Lead Channel Sensing Intrinsic Amplitude: 12 mV
Lead Channel Setting Pacing Amplitude: 2 V
Lead Channel Setting Pacing Amplitude: 2 V
Lead Channel Setting Pacing Pulse Width: 0.5 ms
MDC IDC LEAD IMPLANT DT: 20150421
MDC IDC LEAD LOCATION: 753860
MDC IDC MSMT BATTERY REMAINING LONGEVITY: 55 mo
MDC IDC MSMT LEADCHNL LV PACING THRESHOLD AMPLITUDE: 0.75 V
MDC IDC MSMT LEADCHNL RA PACING THRESHOLD AMPLITUDE: 0.75 V
MDC IDC MSMT LEADCHNL RA PACING THRESHOLD PULSEWIDTH: 0.5 ms
MDC IDC MSMT LEADCHNL RA SENSING INTR AMPL: 4.4 mV
MDC IDC PG IMPLANT DT: 20150421
MDC IDC PG SERIAL: 7157119
MDC IDC SESS DTM: 20171222145209
MDC IDC SET LEADCHNL LV PACING AMPLITUDE: 2 V
MDC IDC SET LEADCHNL LV PACING PULSEWIDTH: 0.5 ms
MDC IDC SET LEADCHNL RV SENSING SENSITIVITY: 0.5 mV
MDC IDC STAT BRADY AP VS PERCENT: 1 %
MDC IDC STAT BRADY AS VS PERCENT: 1.5 %

## 2016-05-07 ENCOUNTER — Telehealth: Payer: Self-pay | Admitting: Cardiology

## 2016-05-07 NOTE — Telephone Encounter (Signed)
LMOVM reminding pt to send remote transmission.   

## 2016-05-08 ENCOUNTER — Other Ambulatory Visit (HOSPITAL_COMMUNITY): Payer: Self-pay | Admitting: Cardiology

## 2016-05-08 DIAGNOSIS — I428 Other cardiomyopathies: Secondary | ICD-10-CM

## 2016-05-12 ENCOUNTER — Encounter: Payer: Self-pay | Admitting: Cardiology

## 2016-05-14 NOTE — Progress Notes (Signed)
No ICM remote transmission received on 05/07/2016.   Next ICM transmission scheduled for 05/27/2016.

## 2016-05-24 ENCOUNTER — Other Ambulatory Visit (HOSPITAL_COMMUNITY): Payer: Self-pay | Admitting: *Deleted

## 2016-05-24 MED ORDER — SIMVASTATIN 40 MG PO TABS: 40.0000 mg | ORAL_TABLET | Freq: Every evening | ORAL | 3 refills | Status: DC

## 2016-05-27 ENCOUNTER — Telehealth: Payer: Self-pay | Admitting: Cardiology

## 2016-05-27 ENCOUNTER — Ambulatory Visit (INDEPENDENT_AMBULATORY_CARE_PROVIDER_SITE_OTHER): Payer: BLUE CROSS/BLUE SHIELD

## 2016-05-27 DIAGNOSIS — Z95810 Presence of automatic (implantable) cardiac defibrillator: Secondary | ICD-10-CM

## 2016-05-27 DIAGNOSIS — I5022 Chronic systolic (congestive) heart failure: Secondary | ICD-10-CM | POA: Diagnosis not present

## 2016-05-27 NOTE — Telephone Encounter (Signed)
LMOVM reminding pt to send remote transmission.   

## 2016-05-28 ENCOUNTER — Telehealth: Payer: Self-pay

## 2016-05-28 NOTE — Telephone Encounter (Signed)
Remote ICM transmission received.  Attempted patient call and left detailed message regarding transmission and next ICM scheduled for 06/28/2016.  Advised to return call for any fluid symptoms or questions.

## 2016-05-28 NOTE — Progress Notes (Signed)
EPIC Encounter for ICM Monitoring  Patient Name: Adrew Dunst is a 51 y.o. male Date: 05/28/2016 Primary Care Physican: Harrison Mons, PA-C Primary Cardiologist: [name] Electrophysiologist: [name] Dry Weight:     unknown Bi-V Pacing:  98%       Attempted call to patient and unable to reach.  Left detailed message regarding transmission.  Transmission reviewed.   Thoracic impedance normal   Recommendations: Provided ICM number and encouraged to call for fluid symptoms.  Follow-up plan: ICM clinic phone appointment on 06/28/2016.  Copy of ICM check sent to device physician.   3 month ICM trend: 05/28/2016   1 Year ICM trend:      [name], RN 05/28/2016 1:26 PM

## 2016-06-10 ENCOUNTER — Telehealth: Payer: Self-pay | Admitting: Cardiology

## 2016-06-10 NOTE — Telephone Encounter (Signed)
LMOVM requesting that pt send manual transmission b/c home monitor has not updated in at least 7 days.    

## 2016-06-28 ENCOUNTER — Telehealth: Payer: Self-pay | Admitting: Cardiology

## 2016-06-28 NOTE — Telephone Encounter (Signed)
LMOVM reminding pt to send remote transmission.   

## 2016-07-01 ENCOUNTER — Telehealth: Payer: Self-pay | Admitting: Cardiology

## 2016-07-01 NOTE — Telephone Encounter (Signed)
LMOVM requesting that pt send manual transmission b/c home monitor has not updated in at least 7 days.    

## 2016-07-05 NOTE — Progress Notes (Signed)
No ICM remote transmission received for 06/28/2016 and next ICM transmission scheduled for 07/26/2016.

## 2016-07-15 ENCOUNTER — Telehealth: Payer: Self-pay | Admitting: Cardiology

## 2016-07-15 NOTE — Telephone Encounter (Signed)
LMOVM requesting that pt send manual transmission b/c home monitor has not updated in at least 7 days.    

## 2016-07-26 ENCOUNTER — Encounter: Payer: BLUE CROSS/BLUE SHIELD | Admitting: *Deleted

## 2016-07-27 ENCOUNTER — Telehealth: Payer: Self-pay | Admitting: Cardiology

## 2016-07-27 NOTE — Telephone Encounter (Signed)
LMOVM reminding pt to send remote transmission.   

## 2016-07-29 ENCOUNTER — Telehealth: Payer: Self-pay | Admitting: Cardiology

## 2016-07-29 NOTE — Telephone Encounter (Signed)
LMOVM requesting that pt send manual transmission b/c home monitor has not updated in at least 7 days.    

## 2016-07-30 ENCOUNTER — Encounter: Payer: Self-pay | Admitting: Cardiology

## 2016-08-02 ENCOUNTER — Ambulatory Visit (INDEPENDENT_AMBULATORY_CARE_PROVIDER_SITE_OTHER): Payer: BLUE CROSS/BLUE SHIELD

## 2016-08-02 ENCOUNTER — Telehealth: Payer: Self-pay

## 2016-08-02 DIAGNOSIS — I5022 Chronic systolic (congestive) heart failure: Secondary | ICD-10-CM | POA: Diagnosis not present

## 2016-08-02 DIAGNOSIS — Z95810 Presence of automatic (implantable) cardiac defibrillator: Secondary | ICD-10-CM | POA: Diagnosis not present

## 2016-08-02 NOTE — Progress Notes (Signed)
EPIC Encounter for ICM Monitoring  Patient Name: Donevan Biller is a 51 y.o. male Date: 08/02/2016 Primary Care Physican: Harrison Mons, PA-C Primary Cardiologist: [name] Electrophysiologist: [name] Dry Weight:unknown Bi-V Pacing: 98%          Attempted call to patient and unable to reach.  Transmission reviewed.    Thoracic impedance normal.  Prescribed dosage: Furosemide 40 mg 1 tablet daily  Recommendations: NONE - Unable to reach patient   Follow-up plan: ICM clinic phone appointment on 09/02/2016.    Copy of ICM check sent to device physician.   3 month ICM trend: 08/01/2016   1 Year ICM trend:      [name], RN 08/02/2016 2:52 PM

## 2016-08-02 NOTE — Telephone Encounter (Signed)
Remote ICM transmission received.  Attempted patient call and no answer and no voice mail.

## 2016-08-19 ENCOUNTER — Encounter: Payer: Self-pay | Admitting: Cardiology

## 2016-08-24 ENCOUNTER — Encounter: Payer: Self-pay | Admitting: Family Medicine

## 2016-08-24 ENCOUNTER — Ambulatory Visit (INDEPENDENT_AMBULATORY_CARE_PROVIDER_SITE_OTHER): Payer: BLUE CROSS/BLUE SHIELD | Admitting: Family Medicine

## 2016-08-24 VITALS — BP 140/78 | HR 80 | Temp 97.9°F | Resp 16 | Ht 73.0 in | Wt 340.0 lb

## 2016-08-24 DIAGNOSIS — E7800 Pure hypercholesterolemia, unspecified: Secondary | ICD-10-CM

## 2016-08-24 DIAGNOSIS — I428 Other cardiomyopathies: Secondary | ICD-10-CM | POA: Diagnosis not present

## 2016-08-24 DIAGNOSIS — I10 Essential (primary) hypertension: Secondary | ICD-10-CM

## 2016-08-24 DIAGNOSIS — Z0000 Encounter for general adult medical examination without abnormal findings: Secondary | ICD-10-CM

## 2016-08-24 DIAGNOSIS — I5022 Chronic systolic (congestive) heart failure: Secondary | ICD-10-CM

## 2016-08-24 DIAGNOSIS — G4733 Obstructive sleep apnea (adult) (pediatric): Secondary | ICD-10-CM | POA: Diagnosis not present

## 2016-08-24 DIAGNOSIS — E559 Vitamin D deficiency, unspecified: Secondary | ICD-10-CM | POA: Diagnosis not present

## 2016-08-24 DIAGNOSIS — E7439 Other disorders of intestinal carbohydrate absorption: Secondary | ICD-10-CM | POA: Diagnosis not present

## 2016-08-24 DIAGNOSIS — Z125 Encounter for screening for malignant neoplasm of prostate: Secondary | ICD-10-CM | POA: Diagnosis not present

## 2016-08-24 NOTE — Progress Notes (Signed)
Subjective:    Patient ID: Gary Williamson, male    DOB: March 24, 1966, 51 y.o.   MRN: [id number]  08/24/2016  Annual Exam   HPI This 51 y.o. male presents for Complete Physical Examination.  Last physical:  08-21-15 Colonoscopy:  2016 PSA: 08-2015 Eye exam:  2016; scheduled next week; +glasses.   Dental exam:  Cleaning few months ago.  Immunization History  Administered Date(s) Administered  . Influenza Split 01/30/2013, 02/14/2014, 02/01/2015  . Influenza, Seasonal, Injecte, Preservative Fre 02/11/2012  . Influenza-Unspecified 02/13/2016  . Pneumococcal Polysaccharide-23 04/03/2013  . Tdap 04/12/2012   BP Readings from Last 3 Encounters:  08/24/16 140/78  02/24/16 132/86  01/07/16 136/82   Wt Readings from Last 3 Encounters:  08/24/16 (!) 340 lb (154.2 kg)  02/24/16 (!) 342 lb 9.6 oz (155.4 kg)  01/07/16 (!) 340 lb 12.8 oz (154.6 kg)    Started vitamin d 1000 IU daily since last visit.  Review of Systems  Constitutional: Negative for activity change, appetite change, chills, diaphoresis, fatigue, fever and unexpected weight change.  HENT: Negative for congestion, dental problem, drooling, ear discharge, ear pain, facial swelling, hearing loss, mouth sores, nosebleeds, postnasal drip, rhinorrhea, sinus pressure, sneezing, sore throat, tinnitus, trouble swallowing and voice change.   Eyes: Negative for photophobia, pain, discharge, redness, itching and visual disturbance.  Respiratory: Negative for apnea, cough, choking, chest tightness, shortness of breath, wheezing and stridor.   Cardiovascular: Negative for chest pain, palpitations and leg swelling.  Gastrointestinal: Negative for abdominal pain, blood in stool, constipation, diarrhea, nausea and vomiting.  Endocrine: Negative for cold intolerance, heat intolerance, polydipsia, polyphagia and polyuria.  Genitourinary: Negative for decreased urine volume, difficulty urinating, discharge, dysuria, enuresis, flank pain,  frequency, genital sores, hematuria, penile pain, penile swelling, scrotal swelling, testicular pain and urgency.  Musculoskeletal: Negative for arthralgias, back pain, gait problem, joint swelling, myalgias, neck pain and neck stiffness.  Skin: Negative for color change, pallor, rash and wound.  Allergic/Immunologic: Negative for environmental allergies, food allergies and immunocompromised state.  Neurological: Negative for dizziness, tremors, seizures, syncope, facial asymmetry, speech difficulty, weakness, light-headedness, numbness and headaches.  Hematological: Negative for adenopathy. Does not bruise/bleed easily.  Psychiatric/Behavioral: Negative for agitation, behavioral problems, confusion, decreased concentration, dysphoric mood, hallucinations, self-injury, sleep disturbance and suicidal ideas. The patient is not nervous/anxious and is not hyperactive.     Past Medical History:  Diagnosis Date  . Acute idiopathic pericarditis 03/24/2010   Overview:  Viral Idiopathic Pericarditis   . Chronic systolic heart failure ([place])   . Hypertension   . LBBB (left bundle branch block)    Chronic  . NICM (nonischemic cardiomyopathy) ([place])    Probably nonischemic; LHC in 2002/03 no sig CAD per report (done in [place]). Cardiomyopathy for 9-10 yrs. 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 Echo 5/11: severe LVE, EF <20%, diff HK, mod dias dys, sev. LAE. Hosp for CHF in 5/11. Ad. MV 6/11: EF 21%, mild per-infart ischemia. LHC (7/11): EF 15%, no CAD, LVEDP 22; Echo (10/11): EF 20-25%;  Echo (12/12):  EF 20%, mod LVH, Gr 1 DD, diff HK, mild LAE  . OSA (obstructive sleep apnea)    cpap used  . Pure hypercholesterolemia   . Severe obesity ([place])    Past Surgical History:  Procedure Laterality Date  . BI-VENTRICULAR IMPLANTABLE CARDIOVERTER DEFIBRILLATOR N/A 08/21/2013   Procedure: BI-VENTRICULAR IMPLANTABLE CARDIOVERTER DEFIBRILLATOR  (CRT-D);  Surgeon: Coralyn Mark, MD;  Location: [facility] CATH LAB;  Service:  Cardiovascular;  Laterality: N/A;  .  CARDIAC CATHETERIZATION  2002 or 2003  . COLONOSCOPY N/A 09/16/2014   Procedure: COLONOSCOPY;  Surgeon: [name], MD;  Location: [facility] ENDOSCOPY;  Service: Endoscopy;  Laterality: N/A;  . LEFT HEART CATH  11/13/09   Dr [name]   No Known Allergies  Social History   Social History  . Marital status: Married    Spouse name: [name]  . Number of children: 2  . Years of education: N/A   Occupational History  . [employer]udlogger of Programmer, applications for [employer]  .  [place]   Social History Main Topics  . Smoking status: Never Smoker  . Smokeless tobacco: Never Used  . Alcohol use 0.0 - 1.0 oz/week     Comment: rare to occ.  . Drug use: No  . Sexual activity: Yes    Birth control/ protection: Condom   Other Topics Concern  . Not on file   Social History Narrative   Marital status: married x 16 years; happily married      Children: 2 children (13,10)      Lives in [place] with his wife and 2 children      Employment: [employer]; Solicitor x 10 years; likes work.  Lots of traveling.       Tobacco: none      Alcohol:  1 drink per week   Regular diet      Exercise:  Walking three days per week; 20 minutes.       Seatbelt: 100%      Family History  Problem Relation Age of Onset  . Hypertension Mother   . Alzheimer's disease Mother   . Hyperlipidemia Mother   . Breast cancer Mother   . Cancer Mother        breast cancer  . Stroke Maternal Grandmother   . Arthritis Father   . Dementia Father   . Hypertension Father   . Alzheimer's disease Father   . COPD Brother   . Coronary artery disease Neg Hx   . Heart failure Neg Hx   . Heart disease Neg Hx        Objective:    BP 140/78   Pulse 80   Temp 97.9 F (36.6 C) (Oral)   Resp 16   Ht 6\' 1"  (1.854 m)   Wt (!) 340 lb (154.2 kg)   SpO2 95%   BMI 44.86 kg/m  Physical Exam  Constitutional: He is oriented to person, place, and time. He appears well-developed and  well-nourished. No distress.  HENT:  Head: Normocephalic and atraumatic.  Right Ear: External ear normal.  Left Ear: External ear normal.  Nose: Nose normal.  Mouth/Throat: Oropharynx is clear and moist.  Eyes: Conjunctivae and EOM are normal. Pupils are equal, round, and reactive to light.  Neck: Normal range of motion. Neck supple. Carotid bruit is not present. No thyromegaly present.  Cardiovascular: Normal rate, regular rhythm, normal heart sounds and intact distal pulses.  Exam reveals no gallop and no friction rub.   No murmur heard. Pulmonary/Chest: Effort normal and breath sounds normal. He has no wheezes. He has no rales.  Abdominal: Soft. Bowel sounds are normal. He exhibits no distension and no mass. There is no tenderness. There is no rebound and no guarding.  Musculoskeletal:       Right shoulder: Normal.       Left shoulder: Normal.       Cervical back: Normal.  Lymphadenopathy:    He has no  cervical adenopathy.  Neurological: He is alert and oriented to person, place, and time. He has normal reflexes. No cranial nerve deficit. He exhibits normal muscle tone. Coordination normal.  Skin: Skin is warm and dry. No rash noted. He is not diaphoretic.  Psychiatric: He has a normal mood and affect. His behavior is normal. Judgment and thought content normal.   Depression screen [facility] 2/9 08/24/2016 02/24/2016 08/21/2015 03/23/2015 08/28/2014  Decreased Interest 0 0 0 0 0  Down, Depressed, Hopeless 0 0 0 0 0  PHQ - 2 Score 0 0 0 0 0   Fall Risk  08/24/2016 02/24/2016 08/21/2015 08/28/2014  Falls in the past year? No No No No        Assessment & Plan:   1. Routine physical examination   2. Chronic systolic heart failure ([place])   3. Essential hypertension   4. NICM (nonischemic cardiomyopathy) ([place])   5. OSA (obstructive sleep apnea)   6. Pure hypercholesterolemia   7. Severe obesity (BMI >= 40) ([facility])   8. Avitaminosis D   9. Glucose intolerance   10. Screening for prostate  cancer    -anticipatory guidance provided. -obtain age appropriate screening labs and labs for chronic disease management. -refills provided.   Orders Placed This Encounter  Procedures  . CBC with Differential/Platelet  . Comprehensive metabolic panel    Order Specific Question:   Has the patient fasted?    Answer:   Yes  . Hemoglobin A1c  . Lipid panel    Order Specific Question:   Has the patient fasted?    Answer:   Yes  . PSA  . TSH  . VITAMIN D 25 Hydroxy (Vit-D Deficiency, Fractures)  . VITAMIN D 25 Hydroxy (Vit-D Deficiency, Fractures)  . POCT urinalysis dipstick   No orders of the defined types were placed in this encounter.   Return in about 6 months (around 02/23/2017) for recheck blood pressure and cholesterol.   Winni Ehrhard Elayne Guerin, M.D. Primary Care at [facility] previously [facility] [place] [address] [place], [place]  [address] [phone] phone [phone] fax

## 2016-08-24 NOTE — Patient Instructions (Addendum)
IF you received an x-ray today, you will receive an invoice from [facility]. Please contact [facility] at [phone] with questions or concerns regarding your invoice.   IF you received labwork today, you will receive an invoice from [facility]. Please contact [facility] at [phone] with questions or concerns regarding your invoice.   Our billing staff will not be able to assist you with questions regarding bills from these companies.  You will be contacted with the lab results as soon as they are available. The fastest way to get your results is to activate your My Chart account. Instructions are located on the last page of this paperwork. If you have not heard from [name] regarding the results in 2 weeks, please contact this office.      Preventive Care 40-64 Years, Male Preventive care refers to lifestyle choices and visits with your health care provider that can promote health and wellness. What does preventive care include?  A yearly physical exam. This is also called an annual well check.  Dental exams once or twice a year.  Routine eye exams. Ask your health care provider how often you should have your eyes checked.  Personal lifestyle choices, including:  Daily care of your teeth and gums.  Regular physical activity.  Eating a healthy diet.  Avoiding tobacco and drug use.  Limiting alcohol use.  Practicing safe sex.  Taking low-dose aspirin every day starting at age 48. What happens during an annual well check? The services and screenings done by your health care provider during your annual well check will depend on your age, overall health, lifestyle risk factors, and family history of disease. Counseling  Your health care provider may ask you questions about your:  Alcohol use.  Tobacco use.  Drug use.  Emotional well-being.  Home and relationship well-being.  Sexual activity.  Eating habits.  Work and work  Statistician. Screening  You may have the following tests or measurements:  Height, weight, and BMI.  Blood pressure.  Lipid and cholesterol levels. These may be checked every 5 years, or more frequently if you are over 38 years old.  Skin check.  Lung cancer screening. You may have this screening every year starting at age 7 if you have a 30-pack-year history of smoking and currently smoke or have quit within the past 15 years.  Fecal occult blood test (FOBT) of the stool. You may have this test every year starting at age 53.  Flexible sigmoidoscopy or colonoscopy. You may have a sigmoidoscopy every 5 years or a colonoscopy every 10 years starting at age 56.  Prostate cancer screening. Recommendations will vary depending on your family history and other risks.  Hepatitis C blood test.  Hepatitis B blood test.  Sexually transmitted disease (STD) testing.  Diabetes screening. This is done by checking your blood sugar (glucose) after you have not eaten for a while (fasting). You may have this done every 1-3 years. Discuss your test results, treatment options, and if necessary, the need for more tests with your health care provider. Vaccines  Your health care provider may recommend certain vaccines, such as:  Influenza vaccine. This is recommended every year.  Tetanus, diphtheria, and acellular pertussis (Tdap, Td) vaccine. You may need a Td booster every 10 years.  Varicella vaccine. You may need this if you have not been vaccinated.  Zoster vaccine. You may need this after age 56.  Measles, mumps, and rubella (MMR) vaccine. You may need at least one  dose of MMR if you were born in 1957 or later. You may also need a second dose.  Pneumococcal 13-valent conjugate (PCV13) vaccine. You may need this if you have certain conditions and have not been vaccinated.  Pneumococcal polysaccharide (PPSV23) vaccine. You may need one or two doses if you smoke cigarettes or if you have  certain conditions.  Meningococcal vaccine. You may need this if you have certain conditions.  Hepatitis A vaccine. You may need this if you have certain conditions or if you travel or work in places where you may be exposed to hepatitis A.  Hepatitis B vaccine. You may need this if you have certain conditions or if you travel or work in places where you may be exposed to hepatitis B.  Haemophilus influenzae type b (Hib) vaccine. You may need this if you have certain risk factors. Talk to your health care provider about which screenings and vaccines you need and how often you need them. This information is not intended to replace advice given to you by your health care provider. Make sure you discuss any questions you have with your health care provider. Document Released: 05/16/2015 Document Revised: 01/07/2016 Document Reviewed: 02/18/2015 Elsevier Interactive Patient Education  2017 [employer].

## 2016-08-25 LAB — COMPREHENSIVE METABOLIC PANEL
A/G RATIO: 1.4 (ref 1.2–2.2)
ALT: 18 IU/L (ref 0–44)
AST: 19 IU/L (ref 0–40)
Albumin: 4 g/dL (ref 3.5–5.5)
Alkaline Phosphatase: 79 IU/L (ref 39–117)
BUN/Creatinine Ratio: 11 (ref 9–20)
BUN: 12 mg/dL (ref 6–24)
Bilirubin Total: 0.2 mg/dL (ref 0.0–1.2)
CO2: 21 mmol/L (ref 18–29)
CREATININE: 1.13 mg/dL (ref 0.76–1.27)
Calcium: 9.2 mg/dL (ref 8.7–10.2)
Chloride: 103 mmol/L (ref 96–106)
GFR, EST AFRICAN AMERICAN: 86 mL/min/{1.73_m2} (ref >59)
GFR, EST NON AFRICAN AMERICAN: 74 mL/min/{1.73_m2} (ref >59)
GLUCOSE: 117 mg/dL — AB (ref 65–99)
Globulin, Total: 2.8 g/dL (ref 1.5–4.5)
Potassium: 4.4 mmol/L (ref 3.5–5.2)
Sodium: 142 mmol/L (ref 134–144)
TOTAL PROTEIN: 6.8 g/dL (ref 6.0–8.5)

## 2016-08-25 LAB — CBC WITH DIFFERENTIAL/PLATELET
BASOS: 0 %
Basophils Absolute: 0 10*3/uL (ref 0.0–0.2)
EOS (ABSOLUTE): 0.3 10*3/uL (ref 0.0–0.4)
Eos: 3 %
Hematocrit: 41.4 % (ref 37.5–51.0)
Hemoglobin: 14.4 g/dL (ref 13.0–17.7)
IMMATURE GRANS (ABS): 0 10*3/uL (ref 0.0–0.1)
IMMATURE GRANULOCYTES: 0 %
LYMPHS: 18 %
Lymphocytes Absolute: 1.7 10*3/uL (ref 0.7–3.1)
MCH: 30.3 pg (ref 26.6–33.0)
MCHC: 34.8 g/dL (ref 31.5–35.7)
MCV: 87 fL (ref 79–97)
Monocytes Absolute: 0.7 10*3/uL (ref 0.1–0.9)
Monocytes: 8 %
NEUTROS PCT: 71 %
Neutrophils Absolute: 6.4 10*3/uL (ref 1.4–7.0)
PLATELETS: 220 10*3/uL (ref 150–379)
RBC: 4.76 x10E6/uL (ref 4.14–5.80)
RDW: 14.8 % (ref 12.3–15.4)
WBC: 9.1 10*3/uL (ref 3.4–10.8)

## 2016-08-25 LAB — HEMOGLOBIN A1C
Est. average glucose Bld gHb Est-mCnc: 126 mg/dL
Hgb A1c MFr Bld: 6 % — ABNORMAL HIGH (ref 4.8–5.6)

## 2016-08-25 LAB — LIPID PANEL
CHOLESTEROL TOTAL: 165 mg/dL (ref 100–199)
Chol/HDL Ratio: 5 ratio (ref 0.0–5.0)
HDL: 33 mg/dL — AB (ref >39)
LDL CALC: 108 mg/dL — AB (ref 0–99)
TRIGLYCERIDES: 122 mg/dL (ref 0–149)
VLDL CHOLESTEROL CAL: 24 mg/dL (ref 5–40)

## 2016-08-25 LAB — VITAMIN D 25 HYDROXY (VIT D DEFICIENCY, FRACTURES): Vit D, 25-Hydroxy: 26.1 ng/mL — ABNORMAL LOW (ref 30.0–100.0)

## 2016-08-25 LAB — PSA: Prostate Specific Ag, Serum: 0.5 ng/mL (ref 0.0–4.0)

## 2016-08-25 LAB — TSH: TSH: 3.36 u[IU]/mL (ref 0.450–4.500)

## 2016-09-02 ENCOUNTER — Ambulatory Visit (INDEPENDENT_AMBULATORY_CARE_PROVIDER_SITE_OTHER): Payer: BLUE CROSS/BLUE SHIELD

## 2016-09-02 ENCOUNTER — Telehealth: Payer: Self-pay | Admitting: Cardiology

## 2016-09-02 DIAGNOSIS — Z95810 Presence of automatic (implantable) cardiac defibrillator: Secondary | ICD-10-CM | POA: Diagnosis not present

## 2016-09-02 DIAGNOSIS — I5022 Chronic systolic (congestive) heart failure: Secondary | ICD-10-CM

## 2016-09-02 NOTE — Telephone Encounter (Signed)
Spoke with pt and reminded pt of remote transmission that is due today. Pt verbalized understanding.   

## 2016-09-03 ENCOUNTER — Telehealth: Payer: Self-pay

## 2016-09-03 NOTE — Telephone Encounter (Signed)
Remote ICM transmission received.  Attempted patient call and left detailed message regarding transmission and next ICM scheduled for 10/04/2016.  Advised to return call for any fluid symptoms or questions.    

## 2016-09-03 NOTE — Progress Notes (Signed)
EPIC Encounter for ICM Monitoring  Patient Name: Gary Williamson is a 51 y.o. male Date: 09/03/2016 Primary Care Physican: Harrison Mons, PA-C Primary Cardiologist: [name] Electrophysiologist: [name] Dry Weight:unknown Bi-V Pacing: 98%      Attempted call to patient and unable to reach.  Left detailed message regarding transmission.  Transmission reviewed.    Thoracic impedance normal but was abnormal suggesting fluid accumulation from ~08/11/2016 to 08/16/2016.  Prescribed dosage: Furosemide 40 mg 1 tablet daily  Recommendations: Left voice mail with ICM number and encouraged to call for fluid symptoms.  Follow-up plan: ICM clinic phone appointment on 10/04/2016.   Copy of ICM check sent to device physician.   3 month ICM trend: 09/03/2016   1 Year ICM trend:      Gary Billings, RN 09/03/2016 7:54 AM

## 2016-10-04 ENCOUNTER — Ambulatory Visit (INDEPENDENT_AMBULATORY_CARE_PROVIDER_SITE_OTHER): Payer: BLUE CROSS/BLUE SHIELD | Admitting: *Deleted

## 2016-10-04 ENCOUNTER — Telehealth: Payer: Self-pay | Admitting: Cardiology

## 2016-10-04 DIAGNOSIS — I5022 Chronic systolic (congestive) heart failure: Secondary | ICD-10-CM | POA: Diagnosis not present

## 2016-10-04 DIAGNOSIS — I428 Other cardiomyopathies: Secondary | ICD-10-CM | POA: Diagnosis not present

## 2016-10-04 DIAGNOSIS — Z95810 Presence of automatic (implantable) cardiac defibrillator: Secondary | ICD-10-CM | POA: Diagnosis not present

## 2016-10-04 NOTE — Telephone Encounter (Signed)
Spoke with pt and reminded pt of remote transmission that is due today. Pt verbalized understanding.   

## 2016-10-05 ENCOUNTER — Telehealth: Payer: Self-pay

## 2016-10-05 NOTE — Telephone Encounter (Signed)
Remote ICM transmission received.  Attempted patient call and left detailed message regarding transmission and next ICM scheduled for 11/05/2016.  Advised to return call for any fluid symptoms or questions.

## 2016-10-05 NOTE — Progress Notes (Signed)
EPIC Encounter for ICM Monitoring  Patient Name: Gary Williamson is a 51 y.o. male Date: 10/05/2016 Primary Care Physican: Wardell Honour, MD Primary Cardiologist: [name] Electrophysiologist: [name] Dry Weight:unknown Bi-V Pacing: 98%      Attempted call to patient and unable to reach.  Left detailed message regarding transmission.  Transmission reviewed.    Thoracic impedance normal.  Prescribed dosage: Furosemide 40 mg 1 tablet daily  Recommendations: Left voice mail with ICM number and encouraged to call for fluid symptoms.  Follow-up plan: ICM clinic phone appointment on 11/05/2016.    Copy of ICM check sent to device physician.   3 month ICM trend: 10/05/2016   1 Year ICM trend:      [name], RN 10/05/2016 9:56 AM

## 2016-10-08 LAB — CUP PACEART REMOTE DEVICE CHECK
Battery Remaining Percentage: 56 %
Battery Voltage: 2.95 V
Brady Statistic AP VP Percent: 3.4 %
Brady Statistic AS VP Percent: 95 %
Brady Statistic RA Percent Paced: 3.1 %
Date Time Interrogation Session: 20180605014916
HIGH POWER IMPEDANCE MEASURED VALUE: 62 Ohm
HighPow Impedance: 62 Ohm
Implantable Lead Implant Date: 20150421
Lead Channel Impedance Value: 450 Ohm
Lead Channel Pacing Threshold Amplitude: 0.625 V
Lead Channel Pacing Threshold Pulse Width: 0.5 ms
Lead Channel Pacing Threshold Pulse Width: 0.5 ms
Lead Channel Sensing Intrinsic Amplitude: 4.6 mV
Lead Channel Sensing Intrinsic Amplitude: 7.5 mV
Lead Channel Setting Pacing Amplitude: 2 V
Lead Channel Setting Pacing Amplitude: 2 V
Lead Channel Setting Pacing Pulse Width: 0.5 ms
MDC IDC LEAD IMPLANT DT: 20150421
MDC IDC LEAD IMPLANT DT: 20150421
MDC IDC LEAD LOCATION: 753857
MDC IDC LEAD LOCATION: 753859
MDC IDC LEAD LOCATION: 753860
MDC IDC MSMT BATTERY REMAINING LONGEVITY: 49 mo
MDC IDC MSMT LEADCHNL LV IMPEDANCE VALUE: 830 Ohm
MDC IDC MSMT LEADCHNL LV PACING THRESHOLD AMPLITUDE: 0.75 V
MDC IDC MSMT LEADCHNL RA IMPEDANCE VALUE: 380 Ohm
MDC IDC MSMT LEADCHNL RA PACING THRESHOLD AMPLITUDE: 0.5 V
MDC IDC MSMT LEADCHNL RA PACING THRESHOLD PULSEWIDTH: 0.5 ms
MDC IDC PG IMPLANT DT: 20150421
MDC IDC PG SERIAL: 7157119
MDC IDC SET LEADCHNL LV PACING AMPLITUDE: 2 V
MDC IDC SET LEADCHNL RV PACING PULSEWIDTH: 0.5 ms
MDC IDC SET LEADCHNL RV SENSING SENSITIVITY: 0.5 mV
MDC IDC STAT BRADY AP VS PERCENT: 1 %
MDC IDC STAT BRADY AS VS PERCENT: 1.7 %

## 2016-10-12 ENCOUNTER — Encounter: Payer: Self-pay | Admitting: Cardiology

## 2016-11-05 ENCOUNTER — Ambulatory Visit (INDEPENDENT_AMBULATORY_CARE_PROVIDER_SITE_OTHER): Payer: BLUE CROSS/BLUE SHIELD

## 2016-11-05 ENCOUNTER — Telehealth: Payer: Self-pay

## 2016-11-05 DIAGNOSIS — I5022 Chronic systolic (congestive) heart failure: Secondary | ICD-10-CM

## 2016-11-05 DIAGNOSIS — Z95810 Presence of automatic (implantable) cardiac defibrillator: Secondary | ICD-10-CM

## 2016-11-05 NOTE — Telephone Encounter (Signed)
Remote ICM transmission received.  Attempted patient call and left message to return call.   

## 2016-11-05 NOTE — Progress Notes (Addendum)
EPIC Encounter for ICM Monitoring  Patient Name: Gary Williamson is a 51 y.o. male Date: 11/05/2016 Primary Care Physican: Wardell Honour, MD Primary Cardiologist: [name] Electrophysiologist: [name] Dry Weight:unknown Bi-V Pacing: 98%      Attempted call to patient and unable to reach.  Left message to return call.  Transmission reviewed.    Thoracic impedance abnormal suggesting fluid accumulation from approximately 10/10/2016 to 10/23/2016 and starting 11/04/2016.  Prescribed dosage: Furosemide 40 mg 1 tablet daily  Labs: 08/24/2016 Creatinine 1.13, BUN 12, Potassium 4.4, Sodium 142  Recommendations: NONE - Unable to reach patient   Follow-up plan: ICM clinic phone appointment on 11/09/2016 to recheck fluid levels.    Patient is overdue to make HF clinic appointment, last appointment was 02/26/2015  Copy of ICM check sent to primary cardiologist and device physician.   3 month ICM trend: 11/05/2016   1 Year ICM trend:      [name], RN 11/05/2016 11:58 AM

## 2016-11-05 NOTE — Progress Notes (Signed)
Patient returned call.  Transmission reviewed and he denied any fluid symptoms.  Recommended to increase Furosemide 40 mg to 1 tablet twice a day x 2 days and then return to 1 tablet once a day.   He agreed. Recheck fluid levels 11/11/2016.

## 2016-11-11 ENCOUNTER — Ambulatory Visit (INDEPENDENT_AMBULATORY_CARE_PROVIDER_SITE_OTHER): Payer: BLUE CROSS/BLUE SHIELD | Admitting: Physician Assistant

## 2016-11-11 ENCOUNTER — Encounter (INDEPENDENT_AMBULATORY_CARE_PROVIDER_SITE_OTHER): Payer: BLUE CROSS/BLUE SHIELD | Admitting: Family Medicine

## 2016-11-11 ENCOUNTER — Encounter: Payer: Self-pay | Admitting: Physician Assistant

## 2016-11-11 ENCOUNTER — Ambulatory Visit (INDEPENDENT_AMBULATORY_CARE_PROVIDER_SITE_OTHER): Payer: BLUE CROSS/BLUE SHIELD

## 2016-11-11 VITALS — BP 117/74 | HR 82 | Temp 98.2°F | Resp 16 | Ht 73.0 in | Wt 337.8 lb

## 2016-11-11 DIAGNOSIS — M79675 Pain in left toe(s): Secondary | ICD-10-CM | POA: Diagnosis not present

## 2016-11-11 DIAGNOSIS — I5022 Chronic systolic (congestive) heart failure: Secondary | ICD-10-CM

## 2016-11-11 DIAGNOSIS — Z95810 Presence of automatic (implantable) cardiac defibrillator: Secondary | ICD-10-CM

## 2016-11-11 LAB — POCT CBC
Granulocyte percent: 77 %G (ref 37–80)
HEMATOCRIT: 42.6 % — AB (ref 43.5–53.7)
Hemoglobin: 14.1 g/dL (ref 14.1–18.1)
LYMPH, POC: 2 (ref 0.6–3.4)
MCH, POC: 29.3 pg (ref 27–31.2)
MCHC: 33 g/dL (ref 31.8–35.4)
MCV: 88.8 fL (ref 80–97)
MID (cbc): 0.3 (ref 0–0.9)
MPV: 7.1 fL (ref 0–99.8)
POC GRANULOCYTE: 7.5 — AB (ref 2–6.9)
POC LYMPH %: 20.3 % (ref 10–50)
POC MID %: 2.7 % (ref 0–12)
Platelet Count, POC: 214 10*3/uL (ref 142–424)
RBC: 4.8 M/uL (ref 4.69–6.13)
RDW, POC: 14.3 %
WBC: 9.7 10*3/uL (ref 4.6–10.2)

## 2016-11-11 MED ORDER — NAPROXEN 500 MG PO TABS: 500.0000 mg | ORAL_TABLET | Freq: Two times a day (BID) | ORAL | 0 refills | Status: DC

## 2016-11-11 NOTE — Patient Instructions (Addendum)
Your history and physical exam findings are consistent with gout. Please take naproxen as prescribed for pain and inflammation. Your pain should resolve over the next week. If this happens again in the future, take naproxen at the onset of symptoms and return to office for evaluation as there are other medications you can take if you catch the flare within 3 days of when it started. Below is a list of foods that are associated with gout flares, please read over these and make sure you do not consume any of these in high amounts. Please return to clinic if symptoms worsen, do not improve, or as needed     Gout Gout is painful swelling that can happen in some of your joints. Gout is a type of arthritis. This condition is caused by having too much uric acid in your body. Uric acid is a chemical that is made when your body breaks down substances called purines. If your body has too much uric acid, sharp crystals can form and build up in your joints. This causes pain and swelling. Gout attacks can happen quickly and be very painful (acute gout). Over time, the attacks can affect more joints and happen more often (chronic gout). Follow these instructions at home: During a Gout Attack  If directed, put ice on the painful area: ? Put ice in a plastic bag. ? Place a towel between your skin and the bag. ? Leave the ice on for 20 minutes, 2-3 times a day.  Rest the joint as much as possible. If the joint is in your leg, you may be given crutches to use.  Raise (elevate) the painful joint above the level of your heart as often as you can.  Drink enough fluids to keep your pee (urine) clear or pale yellow.  Take over-the-counter and prescription medicines only as told by your doctor.  Do not drive or use heavy machinery while taking prescription pain medicine.  Follow instructions from your doctor about what you can or cannot eat and drink.  Return to your normal activities as told by your doctor. Ask  your doctor what activities are safe for you. Avoiding Future Gout Attacks  Follow a low-purine diet as told by a specialist (dietitian) or your doctor. Avoid foods and drinks that have a lot of purines, such as: ? Liver. ? Kidney. ? Anchovies. ? Asparagus. ? Herring. ? Mushrooms ? Mussels. ? Beer.  Limit alcohol intake to no more than 1 drink a day for nonpregnant women and 2 drinks a day for men. One drink equals 12 oz of beer, 5 oz of wine, or 1 oz of hard liquor.  Stay at a healthy weight or lose weight if you are overweight. If you want to lose weight, talk with your doctor. It is important that you do not lose weight too fast.  Start or continue an exercise plan as told by your doctor.  Drink enough fluids to keep your pee clear or pale yellow.  Take over-the-counter and prescription medicines only as told by your doctor.  Keep all follow-up visits as told by your doctor. This is important. Contact a doctor if:  You have another gout attack.  You still have symptoms of a gout attack after10 days of treatment.  You have problems (side effects) because of your medicines.  You have chills or a fever.  You have burning pain when you pee (urinate).  You have pain in your lower back or belly. Get help right away  if:  You have very bad pain.  Your pain cannot be controlled.  You cannot pee. This information is not intended to replace advice given to you by your health care provider. Make sure you discuss any questions you have with your health care provider. Document Released: 01/27/2008 Document Revised: 09/25/2015 Document Reviewed: 01/30/2015 Elsevier Interactive Patient Education  2018 [place].  Naproxen Sodium oral tablet, extended-release What is this medicine? NAPROXEN (na PROX en) is a non-steroidal anti-inflammatory drug (NSAID). It is used to reduce swelling and to treat pain. This medicine may be used for dental pain, headache, or painful monthly  periods. It is also used for painful joint and muscular problems such as arthritis, tendinitis, bursitis, and gout. This medicine may be used for other purposes; ask your health care provider or pharmacist if you have questions. COMMON BRAND NAME(S): Midol Extended Relief, Naprelan Dose Card What should I tell my health care provider before I take this medicine? They need to know if you have any of these conditions: -asthma -cigarette smoker -drink more than 3 alcohol containing drinks a day -heart disease or circulation problems such as heart failure or leg edema (fluid retention) -high blood pressure -kidney disease -liver disease -stomach bleeding or ulcers -an unusual or allergic reaction to naproxen, aspirin, other NSAIDs, other medicines, foods, dyes, or preservatives -pregnant or trying to get pregnant -breast-feeding How should I use this medicine? Take this medicine by mouth with a glass of water. Follow the directions on the prescription label. Take this medicine with food if it upsets your stomach. Try to not lie down for at least 10 minutes after you take it. Take your medicine at regular intervals. Do not take your medicine more often than directed. Long-term, continuous use may increase the risk of heart attack or stroke. A special MedGuide will be given to you by the pharmacist with each prescription and refill. Be sure to read this information carefully each time. Talk to your pediatrician regarding the use of this medicine in children. Special care may be needed. Overdosage: If you think you have taken too much of this medicine contact a poison control center or emergency room at once. NOTE: This medicine is only for you. Do not share this medicine with others. What if I miss a dose? If you miss a dose, take it as soon as you can. If it is almost time for your next dose, take only that dose. Do not take double or extra doses. What may interact with this  medicine? -alcohol -aspirin -cidofovir -diuretics -lithium -methotrexate -other drugs for inflammation like ketorolac or prednisone -pemetrexed -probenecid -warfarin This list may not describe all possible interactions. Give your health care provider a list of all the medicines, herbs, non-prescription drugs, or dietary supplements you use. Also tell them if you smoke, drink alcohol, or use illegal drugs. Some items may interact with your medicine. What should I watch for while using this medicine? Tell your doctor or health care professional if your pain does not get better. Talk to your doctor before taking another medicine for pain. Do not treat yourself. This medicine does not prevent heart attack or stroke. In fact, this medicine may increase the chance of a heart attack or stroke. The chance may increase with longer use of this medicine and in people who have heart disease. If you take aspirin to prevent heart attack or stroke, talk with your doctor or health care professional. Do not take other medicines that contain aspirin, ibuprofen,  or naproxen with this medicine. Side effects such as stomach upset, nausea, or ulcers may be more likely to occur. Many medicines available without a prescription should not be taken with this medicine. This medicine can cause ulcers and bleeding in the stomach and intestines at any time during treatment. Do not smoke cigarettes or drink alcohol. These increase irritation to your stomach and can make it more susceptible to damage from this medicine. Ulcers and bleeding can happen without warning symptoms and can cause death. You may get drowsy or dizzy. Do not drive, use machinery, or do anything that needs mental alertness until you know how this medicine affects you. Do not stand or sit up quickly, especially if you are an older patient. This reduces the risk of dizzy or fainting spells. This medicine can cause you to bleed more easily. Try to avoid damage  to your teeth and gums when you brush or floss your teeth. What side effects may I notice from receiving this medicine? Side effects that you should report to your doctor or health care professional as soon as possible: -black or bloody stools, blood in the urine or vomit -blurred vision -chest pain -difficulty breathing or wheezing -nausea or vomiting -severe stomach pain -skin rash, skin redness, blistering or peeling skin, hives, or itching -slurred speech or weakness on one side of the body -swelling of eyelids, throat, lips -unexplained weight gain or swelling -unusually weak or tired -yellowing of eyes or skin Side effects that usually do not require medical attention (report to your doctor or health care professional if they continue or are bothersome): -constipation -headache -heartburn This list may not describe all possible side effects. Call your doctor for medical advice about side effects. You may report side effects to FDA at 1-800-FDA-1088. Where should I keep my medicine? Keep out of the reach of children. Store at room temperature between 20 and 25 degrees C (68 and 77 degrees F). Keep container tightly closed. Throw away any unused medicine after the expiration date. NOTE: This sheet is a summary. It may not cover all possible information. If you have questions about this medicine, talk to your doctor, pharmacist, or health care provider.  2018 Elsevier/Gold Standard (2009-04-21 20:26:54)  IF you received an x-ray today, you will receive an invoice from [facility]. Please contact [facility] at [phone] with questions or concerns regarding your invoice.   IF you received labwork today, you will receive an invoice from [facility]. Please contact [facility] at [phone] with questions or concerns regarding your invoice.   Our billing staff will not be able to assist you with questions regarding bills from these companies.  You will be contacted  with the lab results as soon as they are available. The fastest way to get your results is to activate your My Chart account. Instructions are located on the last page of this paperwork. If you have not heard from [name] regarding the results in 2 weeks, please contact this office.

## 2016-11-11 NOTE — Progress Notes (Signed)
EPIC Encounter for ICM Monitoring  Patient Name: Gary Williamson is a 51 y.o. male Date: 11/11/2016 Primary Care Physican: Wardell Honour, MD Primary Cardiologist: [name] Electrophysiologist: [name] Dry Weight:unknown Bi-V Pacing: 98%        Heart Failure questions reviewed, pt asymptomatic    Thoracic impedance returned to normal after increase in Furosemide x 2 days.   Prescribed dosage: Furosemide 40 mg 1 tablet daily  Labs: 08/24/2016 Creatinine 1.13, BUN 12, Potassium 4.4, Sodium 142  Recommendations: No changes.    Follow-up plan: ICM clinic phone appointment on 12/06/2016.    Copy of ICM check sent to device physician.   3 month ICM trend: 11/11/2016   1 Year ICM trend:      [name], RN 11/11/2016 11:41 AM

## 2016-11-11 NOTE — Progress Notes (Signed)
Gary Williamson  MRN: [id number] DOB: 04-18-1966  Subjective:  Gary Williamson is a 51 y.o. male seen in office today for a chief complaint of left big toe pain x 2 weeks. It started out exquisite tenderness at the big toe joint, even when the sheets touched his toe. The pain has gotten better over the past 2 weeks but has not resolved. Denies acute injury, redness, warmth, and swelling. Has no hx of gout. Does not consume high seafood diet or excessive alcohol. Has tried soaking toe in Epson salt with no full relief. No personal hx of MI, GI bleed, or kidney disease.   Review of Systems  Constitutional: Negative for chills, diaphoresis and fever.  Musculoskeletal: Negative for gait problem and myalgias.    Patient Active Problem List   Diagnosis Date Noted  . Colon cancer screening   . Benign neoplasm of cecum   . Benign neoplasm of sigmoid colon   . Hematuria, microscopic 05/15/2013  . NICM (nonischemic cardiomyopathy) ([place]) 04/09/2013  . Severe obesity (BMI >= 40) ([place]) 04/03/2013  . OSA (obstructive sleep apnea) 01/18/2013  . Ulnar collateral ligament sprain(841.1) 04/13/2012  . Hypertension   . CARDIOVASCULAR FUNCTION STUDY, ABNORMAL 11/04/2009  . PURE HYPERCHOLESTEROLEMIA 10/14/2009  . Chronic systolic heart failure ([place]) 10/14/2009  . Cardiac enlargement 05/02/2009  . Avitaminosis D 05/02/2009  . HLD (hyperlipidemia) 03/05/2009    Current Outpatient Prescriptions on File Prior to Visit  Medication Sig Dispense Refill  . aspirin 81 MG EC tablet Take 1 tablet (81 mg total) by mouth daily. 30 tablet 2  . carvedilol (COREG) 25 MG tablet TAKE 1 BY MOUTH TWICE DAILY 180 tablet 3  . ENTRESTO 97-103 MG TAKE ONE TABLET BY MOUTH TWICE DAILY 60 tablet 11  . fish oil-omega-3 fatty acids 1000 MG capsule Take 1 capsule (1 g total) by mouth daily. 30 capsule 2  . furosemide (LASIX) 40 MG tablet Take 1 tablet (40 mg total) by mouth daily. 90 tablet 3  . Multiple Vitamin (MULTIVITAMIN)  tablet Take 1 tablet by mouth daily.      . simvastatin (ZOCOR) 40 MG tablet Take 1 tablet (40 mg total) by mouth every evening. 90 tablet 3  . spironolactone (ALDACTONE) 25 MG tablet Take 1 tablet (25 mg total) by mouth daily. 90 tablet 3  . clobetasol ointment (TEMOVATE) 2.53 % Apply 1 application topically 2 (two) times daily. (Patient not taking: Reported on 11/11/2016) 30 g 0   No current facility-administered medications on file prior to visit.     No Known Allergies     Social History   Social History  . Marital status: Married    Spouse name: [name]  . Number of children: 2  . Years of education: N/A   Occupational History  . [employer] of Programmer, applications for [employer]  .  [place]   Social History Main Topics  . Smoking status: Never Smoker  . Smokeless tobacco: Never Used  . Alcohol use 0.0 - 1.0 oz/week     Comment: rare to occ.  . Drug use: No  . Sexual activity: Yes    Birth control/ protection: Condom   Other Topics Concern  . Not on file   Social History Narrative   Marital status: married x 16 years; happily married      Children: 2 children (13,10)      Lives in [place] with his wife and 2 children      Employment: [employer]; HR  Manager x 10 years; likes work.  Lots of traveling.       Tobacco: none      Alcohol:  1 drink per week   Regular diet      Exercise:  Walking three days per week; 20 minutes.       Seatbelt: 100%       Objective:  BP 117/74   Pulse 82   Temp 98.2 F (36.8 C) (Oral)   Resp 16   Ht 6\' 1"  (1.854 m)   Wt (!) 337 lb 12.8 oz (153.2 kg)   SpO2 98%   BMI 44.57 kg/m   Physical Exam  Constitutional: He is oriented to person, place, and time and well-developed, well-nourished, and in no distress.  HENT:  Head: Normocephalic and atraumatic.  Eyes: Conjunctivae are normal.  Neck: Normal range of motion.  Cardiovascular:  Pulses:      Dorsalis pedis pulses are 2+ on the right side, and 2+ on the left side.         Posterior tibial pulses are 2+ on the right side, and 2+ on the left side.  Pulmonary/Chest: Effort normal.  Musculoskeletal:       Right ankle: Normal.       Left ankle: Normal.       Right foot: Normal.       Left foot: There is decreased range of motion and tenderness. There is normal capillary refill.       Feet:  Neurological: He is alert and oriented to person, place, and time. Gait normal.  Skin: Skin is warm and dry.  Psychiatric: Affect normal.  Vitals reviewed.   Results for orders placed or performed in visit on 11/11/16 (from the past 24 hour(s))  POCT CBC     Status: Abnormal   Collection Time: 11/11/16 10:38 AM  Result Value Ref Range   WBC 9.7 4.6 - 10.2 K/uL   Lymph, poc 2.0 0.6 - 3.4   POC LYMPH PERCENT 20.3 10 - 50 %L   MID (cbc) 0.3 0 - 0.9   POC MID % 2.7 0 - 12 %M   POC Granulocyte 7.5 (A) 2 - 6.9   Granulocyte percent 77.0 37 - 80 %G   RBC 4.80 4.69 - 6.13 M/uL   Hemoglobin 14.1 14.1 - 18.1 g/dL   HCT, POC 42.6 (A) 43.5 - 53.7 %   MCV 88.8 80 - 97 fL   MCH, POC 29.3 27 - 31.2 pg   MCHC 33.0 31.8 - 35.4 g/dL   RDW, POC 14.3 %   Platelet Count, POC 214 142 - 424 K/uL   MPV 7.1 0 - 99.8 fL    Dg Toe Great Left  Result Date: 11/11/2016 CLINICAL DATA:  Redness, swelling, pain at first MTP joint EXAM: LEFT GREAT TOE COMPARISON:  None. FINDINGS: There is no evidence of fracture or dislocation. There is no evidence of arthropathy or other focal bone abnormality. Soft tissues are unremarkable. IMPRESSION: Negative. Electronically Signed   By: Rolm Baptise M.D.   On: 11/11/2016 10:51    Assessment and Plan :   1. Great toe pain, left Hx and PE findings consistent with gout. Will treat with NSAIDs. Pt given educational material on gout and which foods to avoid in excess amounts. Encouraged to return to clinic if symptoms worsen, do not improve, or as needed - POCT CBC - Uric Acid - DG Toe Great Left; Future - naproxen (NAPROSYN) 500 MG tablet; Take 1  tablet (  500 mg total) by mouth 2 (two) times daily with a meal.  Dispense: 30 tablet; Refill: 0  Tenna Delaine, PA-C  [facility] at [facility] 11/11/2016 11:18 AM

## 2016-11-12 LAB — URIC ACID: Uric Acid: 9.2 mg/dL — ABNORMAL HIGH (ref 3.7–8.6)

## 2016-11-22 ENCOUNTER — Ambulatory Visit (INDEPENDENT_AMBULATORY_CARE_PROVIDER_SITE_OTHER): Payer: BLUE CROSS/BLUE SHIELD | Admitting: Family Medicine

## 2016-11-22 ENCOUNTER — Encounter (INDEPENDENT_AMBULATORY_CARE_PROVIDER_SITE_OTHER): Payer: Self-pay | Admitting: Family Medicine

## 2016-11-22 VITALS — BP 146/72 | HR 86 | Temp 98.2°F | Ht 72.0 in | Wt 340.0 lb

## 2016-11-22 DIAGNOSIS — Z1389 Encounter for screening for other disorder: Secondary | ICD-10-CM

## 2016-11-22 DIAGNOSIS — R5383 Other fatigue: Secondary | ICD-10-CM

## 2016-11-22 DIAGNOSIS — I509 Heart failure, unspecified: Secondary | ICD-10-CM

## 2016-11-22 DIAGNOSIS — Z6841 Body Mass Index (BMI) 40.0 and over, adult: Secondary | ICD-10-CM | POA: Diagnosis not present

## 2016-11-22 DIAGNOSIS — IMO0001 Reserved for inherently not codable concepts without codable children: Secondary | ICD-10-CM | POA: Insufficient documentation

## 2016-11-22 DIAGNOSIS — E7849 Other hyperlipidemia: Secondary | ICD-10-CM | POA: Insufficient documentation

## 2016-11-22 DIAGNOSIS — G4733 Obstructive sleep apnea (adult) (pediatric): Secondary | ICD-10-CM

## 2016-11-22 DIAGNOSIS — Z9189 Other specified personal risk factors, not elsewhere classified: Secondary | ICD-10-CM | POA: Diagnosis not present

## 2016-11-22 DIAGNOSIS — Z0289 Encounter for other administrative examinations: Secondary | ICD-10-CM

## 2016-11-22 DIAGNOSIS — E784 Other hyperlipidemia: Secondary | ICD-10-CM

## 2016-11-22 DIAGNOSIS — R0602 Shortness of breath: Secondary | ICD-10-CM

## 2016-11-22 DIAGNOSIS — Z1331 Encounter for screening for depression: Secondary | ICD-10-CM

## 2016-11-22 DIAGNOSIS — E559 Vitamin D deficiency, unspecified: Secondary | ICD-10-CM | POA: Diagnosis not present

## 2016-11-22 DIAGNOSIS — E669 Obesity, unspecified: Secondary | ICD-10-CM | POA: Diagnosis not present

## 2016-11-22 DIAGNOSIS — E1159 Type 2 diabetes mellitus with other circulatory complications: Secondary | ICD-10-CM | POA: Insufficient documentation

## 2016-11-22 DIAGNOSIS — I10 Essential (primary) hypertension: Secondary | ICD-10-CM | POA: Diagnosis not present

## 2016-11-22 NOTE — Progress Notes (Signed)
Office: [phone]  /  Fax: [phone]   HPI:   Chief Complaint: [place] (MR# [id number]) is a 51 y.o. male who presents on 11/22/2016 for obesity evaluation and treatment. Current BMI is Body mass index is 46.11 kg/m.Linward Foster has struggled with obesity for years and has been unsuccessful in either losing weight or maintaining long term weight loss. Seville attended our information session and states he is currently in the action stage of change and ready to dedicate time achieving and maintaining a healthier weight.  Demaris states his family eats meals together he thinks his family will eat healthier with  him his desired weight loss is 115 lbs his heaviest weight ever was 350 lbs. he snacks frequently in the evenings he skips meals frequently   Fatigue Revin feels his energy is lower than it should be. This has worsened with weight gain and has not worsened recently. Dermot admits to daytime somnolence and  admits to waking up still tired. Patient is at risk for obstructive sleep apnea. Patent has a history of symptoms of daytime fatigue and morning fatigue. Patient generally gets 6 or 7 hours of sleep per night, and states they generally have restful sleep. Snoring is present. Apneic episodes are present. Epworth Sleepiness Score is 4  Dyspnea on exertion Cavin notes increasing shortness of breath with exercising and seems to be worsening over time with weight gain. He notes getting out of breath sooner with activity than he used to. This has not gotten worse recently. Tay denies orthopnea.  Vitamin D deficiency Aaric has a diagnosis of vitamin D deficiency, no recent labs. He is currently taking OTC vit D and admits fatigue. Colie denies nausea, vomiting or muscle weakness.  Congestive Heart Failure Standley is status post defibrillation implant in 2015. His EKG shows pacer spikes and he has a history of hypertension and sleep  apnea.  Hypertension Shenouda Genova is a 51 y.o. male with hypertension, his blood pressure is elevated today and is normally controlled per [place].  Gentry Xiao denies chest pain. He is attempting weight loss to help control his blood pressure with the goal of decreasing his risk of heart attack and stroke. Orlandos blood pressure is not currently controlled.  Sleep Apnea Neilson has a diagnosis of sleep apnea and is not wearing his CPAP. He didn't tolerate mask before and only wore it 1/2 of the night.  Hyperlipidemia Edwyn has hyperlipidemia and is currently on a statin. He has been trying to improve his cholesterol levels with intensive lifestyle modification including a low saturated fat diet, exercise and weight loss. He denies any chest pain, claudication or myalgias.  At risk for diabetes Abdalla is at higher than average risk for developing diabetes due to his obesity. He currently denies polyuria or polydipsia.  Depression Screen Ryane's Food and Mood (modified PHQ-9) score was  Depression screen PHQ 2/9 11/22/2016  Decreased Interest 1  Down, Depressed, Hopeless 0  PHQ - 2 Score 1  Altered sleeping 1  Tired, decreased energy 1  Change in appetite 0  Feeling bad or failure about yourself  0  Trouble concentrating 0  Moving slowly or fidgety/restless 0  Suicidal thoughts 0  PHQ-9 Score 3    ALLERGIES: No Known Allergies  MEDICATIONS: Current Outpatient Prescriptions on File Prior to Visit  Medication Sig Dispense Refill   aspirin 81 MG EC tablet Take 1 tablet (81 mg total) by mouth daily. 30 tablet 2   carvedilol (COREG) 25 MG tablet  TAKE 1 BY MOUTH TWICE DAILY 180 tablet 3   ENTRESTO 97-103 MG TAKE ONE TABLET BY MOUTH TWICE DAILY 60 tablet 11   fish oil-omega-3 fatty acids 1000 MG capsule Take 1 capsule (1 g total) by mouth daily. 30 capsule 2   furosemide (LASIX) 40 MG tablet Take 1 tablet (40 mg total) by mouth daily. 90 tablet 3   Multiple Vitamin  (MULTIVITAMIN) tablet Take 1 tablet by mouth daily.       simvastatin (ZOCOR) 40 MG tablet Take 1 tablet (40 mg total) by mouth every evening. 90 tablet 3   spironolactone (ALDACTONE) 25 MG tablet Take 1 tablet (25 mg total) by mouth daily. 90 tablet 3   clobetasol ointment (TEMOVATE) 9.62 % Apply 1 application topically 2 (two) times daily. (Patient not taking: Reported on 11/11/2016) 30 g 0   naproxen (NAPROSYN) 500 MG tablet Take 1 tablet (500 mg total) by mouth 2 (two) times daily with a meal. (Patient not taking: Reported on 11/22/2016) 30 tablet 0   No current facility-administered medications on file prior to visit.     PAST MEDICAL HISTORY: Past Medical History:  Diagnosis Date   Acute idiopathic pericarditis 03/24/2010   Overview:  Viral Idiopathic Pericarditis    Chronic systolic heart failure (HCC)    Hypertension    LBBB (left bundle branch block)    Chronic   Leg edema    NICM (nonischemic cardiomyopathy) ([place])    Probably nonischemic; LHC in 2002/03 no sig CAD per report (done in [place]). Cardiomyopathy for 9-10 yrs. 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 Echo 5/11: severe LVE, EF <20%, diff HK, mod dias dys, sev. LAE. Hosp for CHF in 5/11. Ad. MV 6/11: EF 21%, mild per-infart ischemia. LHC (7/11): EF 15%, no CAD, LVEDP 22; Echo (10/11): EF 20-25%;  Echo (12/12):  EF 20%, mod LVH, Gr 1 DD, diff HK, mild LAE   OSA (obstructive sleep apnea)    cpap used   Pure hypercholesterolemia    Severe obesity (HCC)    Vitamin D deficiency     PAST SURGICAL HISTORY: Past Surgical History:  Procedure Laterality Date   BI-VENTRICULAR IMPLANTABLE CARDIOVERTER DEFIBRILLATOR N/A 08/21/2013   Procedure: BI-VENTRICULAR IMPLANTABLE CARDIOVERTER DEFIBRILLATOR  (CRT-D);  Surgeon: Coralyn Mark, MD;  Location[facility] C[facility] LAB;  Service: Cardiovascular;  Laterality: N/A;   CARDIAC CATHETERIZATION  2002 or 2003   COLONOSCOPY N/A 09/16/2014   Procedure: COLONOSCOPY;  Surgeon: [name], MD;  Location: [facility] ENDOSCOPY;  Service: Endoscopy;  Laterality: N/A;   LEFT HEART CATH  11/13/09   Dr [name]    SOCIAL HISTORY: Social History  Substance Use Topics   Smoking status: Never Smoker   Smokeless tobacco: Never Used   Alcohol use 0.0 - 1.0 oz/week     Comment: rare to occ.    FAMILY HISTORY: Family History  Problem Relation Age of Onset   Hypertension Mother    Alzheimer's disease Mother    Hyperlipidemia Mother    Breast cancer Mother    Cancer Mother        breast cancer   Stroke Maternal Grandmother    Arthritis Father    Dementia Father    Hypertension Father    Alzheimer's disease Father    COPD Brother    Coronary artery disease Neg Hx    Heart failure Neg Hx    Heart disease Neg Hx     ROS: Review of Systems  Constitutional: Positive for malaise/fatigue.  Eyes:       Wear Glasses or Contacts  Respiratory: Positive for shortness of breath (with activity).   Cardiovascular: Negative for chest pain, orthopnea and claudication.  Gastrointestinal: Negative for nausea and vomiting.  Genitourinary: Negative for frequency.  Musculoskeletal: Negative for myalgias.       Negative muscle weakness  Endo/Heme/Allergies: Negative for polydipsia.    PHYSICAL EXAM: Blood pressure (!) 146/72, pulse 86, temperature 98.2 F (36.8 C), temperature source Oral, height 6' (1.829 m), weight (!) 340 lb (154.2 kg), SpO2 96 %. Body mass index is 46.11 kg/m. Physical Exam  Constitutional: He is oriented to person, place, and time. He appears well-developed and well-nourished.  Cardiovascular:  Irregularly irregular  Pulmonary/Chest: Effort normal.  Musculoskeletal: He exhibits edema (1+ trace edema).  Neurological: He is oriented to person, place, and time.  Skin: Skin is warm and dry.  Psychiatric: He has a normal mood and affect. His behavior is normal.  Vitals reviewed.   RECENT LABS AND TESTS: BMET    Component Value Date/Time    NA 142 08/24/2016 0953   K 4.4 08/24/2016 0953   CL 103 08/24/2016 0953   CO2 21 08/24/2016 0953   GLUCOSE 117 (H) 08/24/2016 0953   GLUCOSE 119 (H) 02/24/2016 1003   BUN 12 08/24/2016 0953   CREATININE 1.13 08/24/2016 0953   CREATININE 1.06 02/24/2016 1003   CALCIUM 9.2 08/24/2016 0953   GFRNONAA 74 08/24/2016 0953   GFRAA 86 08/24/2016 0953   Lab Results  Component Value Date   HGBA1C 6.0 (H) 08/24/2016   No results found for: INSULIN CBC    Component Value Date/Time   WBC 9.7 11/11/2016 1038   WBC 7.9 02/24/2016 1003   RBC 4.80 11/11/2016 1038   RBC 4.46 02/24/2016 1003   HGB 14.1 11/11/2016 1038   HGB 14.4 08/24/2016 0953   HCT 42.6 (A) 11/11/2016 1038   HCT 41.4 08/24/2016 0953   PLT 220 08/24/2016 0953   MCV 88.8 11/11/2016 1038   MCV 87 08/24/2016 0953   MCH 29.3 11/11/2016 1038   MCH 30.0 02/24/2016 1003   MCHC 33.0 11/11/2016 1038   MCHC 34.3 02/24/2016 1003   RDW 14.8 08/24/2016 0953   LYMPHSABS 1.7 08/24/2016 0953   MONOABS 711 02/24/2016 1003   EOSABS 0.3 08/24/2016 0953   BASOSABS 0.0 08/24/2016 0953   Iron/TIBC/Ferritin/ %Sat No results found for: IRON, TIBC, FERRITIN, IRONPCTSAT Lipid Panel     Component Value Date/Time   CHOL 165 08/24/2016 0953   TRIG 122 08/24/2016 0953   HDL 33 (L) 08/24/2016 0953   CHOLHDL 5.0 08/24/2016 0953   CHOLHDL 5.5 (H) 02/24/2016 1003   VLDL 24 02/24/2016 1003   LDLCALC 108 (H) 08/24/2016 0953   Hepatic Function Panel     Component Value Date/Time   PROT 6.8 08/24/2016 0953   ALBUMIN 4.0 08/24/2016 0953   AST 19 08/24/2016 0953   ALT 18 08/24/2016 0953   ALKPHOS 79 08/24/2016 0953   BILITOT 0.2 08/24/2016 0953   BILIDIR 0.1 12/22/2009 0846      Component Value Date/Time   TSH 3.360 08/24/2016 0953   TSH 2.68 08/21/2015 0902   TSH 1.871 08/28/2014 0939    ECG  shows NSR with a rate of 84 BPM INDIRECT CALORIMETER done today shows a VO2 of 407 and a REE of 2835. His calculated basal metabolic rate is  2505 thus his basal metabolic rate is worse than expected.    ASSESSMENT AND PLAN: Other  fatigue - Plan: EKG 12-Lead, Vitamin B12, CBC With Differential, Comprehensive metabolic panel, Folate, Hemoglobin A1c, Insulin, random, T3, T4, free, TSH  Shortness of breath on exertion  Essential hypertension  Other hyperlipidemia - Plan: Lipid Panel With LDL/HDL Ratio  Vitamin D deficiency - Plan: VITAMIN D 25 Hydroxy (Vit-D Deficiency, Fractures)  Other congestive heart failure (HCC)  Obstructive sleep apnea syndrome  Depression screening  At risk for diabetes mellitus - Plan: Hemoglobin A1c, Insulin, random  Class 3 obesity with serious comorbidity and body mass index (BMI) of 45.0 to 49.9 in adult, unspecified obesity type (HCC)  PLAN:  Fatigue Ayaz was informed that his fatigue may be related to obesity, depression or many other causes. Labs will be ordered, and in the meanwhile Hartley has agreed to work on diet, exercise and weight loss to help with fatigue. Proper sleep hygiene was discussed including the need for 7-8 hours of quality sleep each night. A sleep study was not ordered based on symptoms and Epworth score.  Dyspnea on exertion Lajuan's shortness of breath appears to be obesity related and exercise induced. He has agreed to work on weight loss and gradually increase exercise to treat his exercise induced shortness of breath. If Mitul follows our instructions and loses weight without improvement of his shortness of breath, we will plan to refer to pulmonology. We will monitor this condition regularly. Chrystian agrees to this plan.  Vitamin D Deficiency Ovide was informed that low vitamin D levels contributes to fatigue and are associated with obesity, breast, and colon cancer. He agrees to continue to take OTC vitamin D and we will check labs and will follow up for routine testing of vitamin D, at least 2-3 times per year. He was informed of the risk of  over-replacement of vitamin D and agrees to not increase his dose unless he discusses this with [name] first. Deanglo agrees to follow up with our clinic in 2 weeks.  Congestive Heart Failure We will get records from cardiology and review. Manning agrees to continue Lasix as prescribed and follow up with our cliniic in 2 weeks.  Hypertension We discussed sodium restriction, working on healthy weight loss, and a regular exercise program as the means to achieve improved blood pressure control. Rishi agreed with this plan and agreed to follow up as directed. We will check labs and continue to monitor his blood pressure as well as his progress with the above lifestyle modifications. He will continue his medications as prescribed and will watch for signs of hypotension as he continues his lifestyle modifications.  Sleep Apnea Tywone was strongly encouraged to start using his CPAP for at least part of the evening with the goal to improve sleep apnea with weight loss.  Hyperlipidemia Cadan was informed of the American Heart Association Guidelines emphasizing intensive lifestyle modifications as the first line treatment for hyperlipidemia. We discussed many lifestyle modifications today in depth, and Olivier will continue to work on decreasing saturated fats such as fatty red meat, butter and many fried foods. He will also increase vegetables and lean protein in his diet and continue to work on exercise and weight loss efforts. We will check labs and [place] agrees to follow up with our clinic in 2 weeks.  Diabetes risk counselling Keaundre was given extended (15 minutes) diabetes prevention counseling today. He is 51 y.o. male and has risk factors for diabetes including obesity. We discussed intensive lifestyle modifications today with an emphasis on weight loss as well as increasing exercise and  decreasing simple carbohydrates in his diet.  Depression Screen Ilyas had a negative depression screening.  Depression is commonly associated with obesity and often results in emotional eating behaviors. We will monitor this closely and work on CBT to help improve the non-hunger eating patterns.   Obesity Elick is currently in the action stage of change and his goal is to continue with weight loss efforts He has agreed to follow the Category 3 plan +200 calories Json has been instructed to work up to a goal of 150 minutes of combined cardio and strengthening exercise per week for weight loss and overall health benefits. We discussed the following Behavioral Modification Strategies today: travel eating strategies, increasing lean protein intake and ways to avoid night time snacking  Jayland has agreed to follow up with our clinic in 2 weeks. He was informed of the importance of frequent follow up visits to maximize his success with intensive lifestyle modifications for his multiple health conditions. He was informed we would discuss his lab results at his next visit unless there is a critical issue that needs to be addressed sooner. Dominik agreed to keep his next visit at the agreed upon time to discuss these results.  I, Doreene Nest, am acting as transcriptionist for Dennard Nip, MD  I have reviewed the above documentation for accuracy and completeness, and I agree with the above. -Dennard Nip, MD   OBESITY BEHAVIORAL INTERVENTION VISIT  Today's visit was # 1 out of 67.  Starting weight: 340 lbs Starting date: 11/22/16 Today's weight : 340 lbs Today's date: 11/22/2016 Total lbs lost to date: 0 (Patients must lose 7 lbs in the first 6 months to continue with counseling)   ASK: We discussed the diagnosis of obesity with [facility] today and Izear agreed to give [name] permission to discuss obesity behavioral modification therapy today.  ASSESS: Leston has the diagnosis of obesity and his BMI today is 70.2 Coyle is in the action stage of change   ADVISE: Nishant was educated  on the multiple health risks of obesity as well as the benefit of weight loss to improve his health. He was advised of the need for long term treatment and the importance of lifestyle modifications.  AGREE: Multiple dietary modification options and treatment options were discussed and  Jamisen agreed to follow the Category 3 plan +200 calories We discussed the following Behavioral Modification Strategies today: travel eating strategies, increasing lean protein intake and ways to avoid night time snacking

## 2016-11-23 LAB — CBC WITH DIFFERENTIAL
BASOS ABS: 0 10*3/uL (ref 0.0–0.2)
Basos: 0 %
EOS (ABSOLUTE): 0.3 10*3/uL (ref 0.0–0.4)
EOS: 4 %
HEMATOCRIT: 39.8 % (ref 37.5–51.0)
Hemoglobin: 13.4 g/dL (ref 13.0–17.7)
IMMATURE GRANULOCYTES: 0 %
Immature Grans (Abs): 0 10*3/uL (ref 0.0–0.1)
Lymphocytes Absolute: 1.9 10*3/uL (ref 0.7–3.1)
Lymphs: 23 %
MCH: 29.6 pg (ref 26.6–33.0)
MCHC: 33.7 g/dL (ref 31.5–35.7)
MCV: 88 fL (ref 79–97)
MONOS ABS: 0.7 10*3/uL (ref 0.1–0.9)
Monocytes: 8 %
NEUTROS PCT: 65 %
Neutrophils Absolute: 5.4 10*3/uL (ref 1.4–7.0)
RBC: 4.53 x10E6/uL (ref 4.14–5.80)
RDW: 15 % (ref 12.3–15.4)
WBC: 8.4 10*3/uL (ref 3.4–10.8)

## 2016-11-23 LAB — COMPREHENSIVE METABOLIC PANEL
ALT: 21 IU/L (ref 0–44)
AST: 24 IU/L (ref 0–40)
Albumin/Globulin Ratio: 1.4 (ref 1.2–2.2)
Albumin: 3.8 g/dL (ref 3.5–5.5)
Alkaline Phosphatase: 71 IU/L (ref 39–117)
BUN/Creatinine Ratio: 12 (ref 9–20)
BUN: 12 mg/dL (ref 6–24)
Bilirubin Total: 0.3 mg/dL (ref 0.0–1.2)
CO2: 24 mmol/L (ref 20–29)
CREATININE: 0.98 mg/dL (ref 0.76–1.27)
Calcium: 8.8 mg/dL (ref 8.7–10.2)
Chloride: 105 mmol/L (ref 96–106)
GFR calc Af Amer: 102 mL/min/{1.73_m2} (ref >59)
GFR, EST NON AFRICAN AMERICAN: 88 mL/min/{1.73_m2} (ref >59)
GLOBULIN, TOTAL: 2.7 g/dL (ref 1.5–4.5)
GLUCOSE: 119 mg/dL — AB (ref 65–99)
Potassium: 4.1 mmol/L (ref 3.5–5.2)
Sodium: 144 mmol/L (ref 134–144)
Total Protein: 6.5 g/dL (ref 6.0–8.5)

## 2016-11-23 LAB — LIPID PANEL WITH LDL/HDL RATIO
CHOLESTEROL TOTAL: 165 mg/dL (ref 100–199)
HDL: 29 mg/dL — AB (ref >39)
LDL Calculated: 106 mg/dL — ABNORMAL HIGH (ref 0–99)
LDl/HDL Ratio: 3.7 ratio — ABNORMAL HIGH (ref 0.0–3.6)
TRIGLYCERIDES: 149 mg/dL (ref 0–149)
VLDL CHOLESTEROL CAL: 30 mg/dL (ref 5–40)

## 2016-11-23 LAB — TSH: TSH: 4.69 u[IU]/mL — ABNORMAL HIGH (ref 0.450–4.500)

## 2016-11-23 LAB — VITAMIN B12: VITAMIN B 12: 410 pg/mL (ref 232–1245)

## 2016-11-23 LAB — VITAMIN D 25 HYDROXY (VIT D DEFICIENCY, FRACTURES): Vit D, 25-Hydroxy: 20.6 ng/mL — ABNORMAL LOW (ref 30.0–100.0)

## 2016-11-23 LAB — T3: T3, Total: 138 ng/dL (ref 71–180)

## 2016-11-23 LAB — INSULIN, RANDOM: INSULIN: 37.2 u[IU]/mL — AB (ref 2.6–24.9)

## 2016-11-23 LAB — HEMOGLOBIN A1C
Est. average glucose Bld gHb Est-mCnc: 131 mg/dL
Hgb A1c MFr Bld: 6.2 % — ABNORMAL HIGH (ref 4.8–5.6)

## 2016-11-23 LAB — FOLATE: FOLATE: 14.1 ng/mL (ref >3.0)

## 2016-11-23 LAB — T4, FREE: FREE T4: 1.22 ng/dL (ref 0.82–1.77)

## 2016-11-26 ENCOUNTER — Telehealth: Payer: Self-pay | Admitting: Family Medicine

## 2016-11-26 NOTE — Telephone Encounter (Signed)
Pt [facility] is calling about questions for spironoclactine 25 mg reference number is [id number]  Best number [phone]

## 2016-11-29 ENCOUNTER — Telehealth: Payer: Self-pay | Admitting: Family Medicine

## 2016-11-29 NOTE — Telephone Encounter (Signed)
Pt pharmacy is calling because they have questions about spironoclactine 25mg   Reference number is [id number]    Best number is [phone] they called again today 11/29/16 and also on 11/26/16 but message was accidentally closed when I added the 2nd message

## 2016-11-29 NOTE — Telephone Encounter (Signed)
[facility] is calling back to as the questions about spironoclactine 2mg 

## 2016-12-02 NOTE — Telephone Encounter (Signed)
Called number. States Education officer, environmental". Pt has not been or ordered spironolactone, currently on lasix.

## 2016-12-06 ENCOUNTER — Ambulatory Visit (INDEPENDENT_AMBULATORY_CARE_PROVIDER_SITE_OTHER): Payer: BLUE CROSS/BLUE SHIELD

## 2016-12-06 ENCOUNTER — Telehealth: Payer: Self-pay

## 2016-12-06 DIAGNOSIS — I5022 Chronic systolic (congestive) heart failure: Secondary | ICD-10-CM

## 2016-12-06 DIAGNOSIS — Z95810 Presence of automatic (implantable) cardiac defibrillator: Secondary | ICD-10-CM

## 2016-12-06 NOTE — Telephone Encounter (Signed)
Remote ICM transmission received.  Attempted patient call and left detailed message regarding transmission and next ICM scheduled for 01/06/2017.  Advised to return call for any fluid symptoms or questions.    

## 2016-12-06 NOTE — Progress Notes (Signed)
EPIC Encounter for ICM Monitoring  Patient Name: Ry Moody is a 51 y.o. male Date: 12/06/2016 Primary Care Physican: Wardell Honour, MD Primary Cardiologist: [name] Electrophysiologist: [name] Dry Weight:unknown Bi-V Pacing: 98%      Attempted call to patient and unable to reach.  Left detailed message regarding transmission.  Transmission reviewed.    Thoracic impedance normal but was abnormal suggesting fluid accumulation from approximately 11/13/2016 to 11/22/2016 (9 days)  Prescribed dosage: Furosemide 40 mg 1 tablet daily  Labs: 11/22/2016 Creatinine 0.98, BUN 12, Potassium 4.1, Sodium 144, EGFR 88-102 08/24/2016 Creatinine 1.13, BUN 12, Potassium 4.4, Sodium 142  Recommendations: Left voice mail with ICM number and encouraged to call for fluid symptoms.  Follow-up plan: ICM clinic phone appointment on 01/06/2017.  Due to make an appointment with Dr [name] in September.   Copy of ICM check sent to device physician.   3 month ICM trend: 12/06/2016   1 Year ICM trend:      [name], RN 12/06/2016 9:09 AM

## 2016-12-07 ENCOUNTER — Ambulatory Visit (INDEPENDENT_AMBULATORY_CARE_PROVIDER_SITE_OTHER): Payer: BLUE CROSS/BLUE SHIELD | Admitting: Family Medicine

## 2016-12-07 VITALS — BP 150/73 | HR 92 | Temp 97.6°F | Ht 72.0 in | Wt 337.0 lb

## 2016-12-07 DIAGNOSIS — R7303 Prediabetes: Secondary | ICD-10-CM

## 2016-12-07 DIAGNOSIS — E669 Obesity, unspecified: Secondary | ICD-10-CM | POA: Diagnosis not present

## 2016-12-07 DIAGNOSIS — Z6841 Body Mass Index (BMI) 40.0 and over, adult: Secondary | ICD-10-CM | POA: Diagnosis not present

## 2016-12-07 DIAGNOSIS — Z9189 Other specified personal risk factors, not elsewhere classified: Secondary | ICD-10-CM | POA: Diagnosis not present

## 2016-12-07 DIAGNOSIS — IMO0001 Reserved for inherently not codable concepts without codable children: Secondary | ICD-10-CM

## 2016-12-07 DIAGNOSIS — E559 Vitamin D deficiency, unspecified: Secondary | ICD-10-CM | POA: Diagnosis not present

## 2016-12-07 MED ORDER — METFORMIN HCL 500 MG PO TABS: 500.0000 mg | ORAL_TABLET | Freq: Every morning | ORAL | 0 refills | Status: DC

## 2016-12-07 MED ORDER — VITAMIN D (ERGOCALCIFEROL) 1.25 MG (50000 UNIT) PO CAPS: 50000.0000 [IU] | ORAL_CAPSULE | ORAL | 0 refills | Status: DC

## 2016-12-07 NOTE — Progress Notes (Signed)
Office: [phone]  /  Fax: [phone]   HPI:   Chief Complaint: Gary Williamson is here to discuss his progress with his obesity treatment plan. He is on the Category 3 plan and followed his eating plan approximately 80 % of the time on week 1 and 50% of the time on week 2. He states he is walking 20 minutes 3 times per week. Gary Williamson did well with weight loss over the last 2 weeks. He went on vacation and tried to do better with his diet than he normally would. Now he is back in town and is ready to get back on track. His weight is (!) 337 lb (152.9 kg) today and has had a weight loss of 3 pounds over a period of 2 weeks since his last visit. He has lost 3 lbs since starting treatment with [name].  Vitamin D deficiency Gary Williamson has a new diagnosis of vitamin D deficiency. He is currently taking OTC vit D and is not yet at goal. He admits fatigue and denies nausea, vomiting or muscle weakness.  Gary Williamson has a diagnosis of pre-diabetes based on his elevated Hgb A1c at 6.2 and fasting insulin and glucose are elevated. He was informed this puts him at greater risk of developing diabetes. He is not taking metformin currently and he continues to work on diet and exercise to decrease risk of diabetes. He admits polyphagia and denies nausea or hypoglycemia.  At risk for diabetes Gary Williamson is at higher than average risk for developing diabetes due to his obesity. He currently denies polyuria or polydipsia.  ALLERGIES: No Known Allergies  MEDICATIONS: Current Outpatient Prescriptions on File Prior to Visit  Medication Sig Dispense Refill  . aspirin 81 MG EC tablet Take 1 tablet (81 mg total) by mouth daily. 30 tablet 2  . carvedilol (COREG) 25 MG tablet TAKE 1 BY MOUTH TWICE DAILY 180 tablet 3  . Cholecalciferol (VITAMIN D) 2000 units CAPS Take by mouth daily.    . clobetasol ointment (TEMOVATE) 9.76 % Apply 1 application topically 2 (two) times daily. 30 g 0  . ENTRESTO 97-103 MG  TAKE ONE TABLET BY MOUTH TWICE DAILY 60 tablet 11  . fish oil-omega-3 fatty acids 1000 MG capsule Take 1 capsule (1 g total) by mouth daily. 30 capsule 2  . furosemide (LASIX) 40 MG tablet Take 1 tablet (40 mg total) by mouth daily. 90 tablet 3  . Multiple Vitamin (MULTIVITAMIN) tablet Take 1 tablet by mouth daily.      . naproxen (NAPROSYN) 500 MG tablet Take 1 tablet (500 mg total) by mouth 2 (two) times daily with a meal. 30 tablet 0  . simvastatin (ZOCOR) 40 MG tablet Take 1 tablet (40 mg total) by mouth every evening. 90 tablet 3  . spironolactone (ALDACTONE) 25 MG tablet Take 1 tablet (25 mg total) by mouth daily. 90 tablet 3   No current facility-administered medications on file prior to visit.     PAST MEDICAL HISTORY: Past Medical History:  Diagnosis Date  . Acute idiopathic pericarditis 03/24/2010   Overview:  Viral Idiopathic Pericarditis   . Chronic systolic heart failure ([place])   . Hypertension   . LBBB (left bundle branch block)    Chronic  . Leg edema   . NICM (nonischemic cardiomyopathy) ([place])    Probably nonischemic; LHC in 2002/03 no sig CAD per report (done in [place]). Cardiomyopathy for 9-10 yrs. 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 Echo 5/11: severe LVE, EF <20%, diff  HK, mod dias dys, sev. LAE. Hosp for CHF in 5/11. Ad. MV 6/11: EF 21%, mild per-infart ischemia. LHC (7/11): EF 15%, no CAD, LVEDP 22; Echo (10/11): EF 20-25%;  Echo (12/12):  EF 20%, mod LVH, Gr 1 DD, diff HK, mild LAE  . OSA (obstructive sleep apnea)    cpap used  . Pure hypercholesterolemia   . Severe obesity ([place])   . Vitamin D deficiency     PAST SURGICAL HISTORY: Past Surgical History:  Procedure Laterality Date  . BI-VENTRICULAR IMPLANTABLE CARDIOVERTER DEFIBRILLATOR N/A 08/21/2013   Procedure: BI-VENTRICULAR IMPLANTABLE CARDIOVERTER DEFIBRILLATOR  (CRT-D);  Surgeon: Coralyn Mark, MD;  Location: [facility] C[facility] LAB;  Service: Cardiovascular;  Laterality: N/A;  . CARDIAC CATHETERIZATION  2002 or 2003   . COLONOSCOPY N/A 09/16/2014   Procedure: COLONOSCOPY;  Surgeon: [name], MD;  Location: [facility] ENDOSCOPY;  Service: Endoscopy;  Laterality: N/A;  . LEFT HEART CATH  11/13/09   Dr [name]    SOCIAL HISTORY: Social History  Substance Use Topics  . Smoking status: Never Smoker  . Smokeless tobacco: Never Used  . Alcohol use 0.0 - 1.0 oz/week     Comment: rare to occ.    FAMILY HISTORY: Family History  Problem Relation Age of Onset  . Hypertension Mother   . Alzheimer's disease Mother   . Hyperlipidemia Mother   . Breast cancer Mother   . Cancer Mother        breast cancer  . Stroke Maternal Grandmother   . Arthritis Father   . Dementia Father   . Hypertension Father   . Alzheimer's disease Father   . COPD Brother   . Coronary artery disease Neg Hx   . Heart failure Neg Hx   . Heart disease Neg Hx     ROS: Review of Systems  Constitutional: Positive for malaise/fatigue and weight loss.  Gastrointestinal: Negative for nausea and vomiting.  Genitourinary: Negative for frequency.  Musculoskeletal:       Negative muscle weakness  Endo/Heme/Allergies: Negative for polydipsia.       Polyphagia Negative hypoglycemia    PHYSICAL EXAM: Blood pressure (!) 150/73, pulse 92, temperature 97.6 F (36.4 C), temperature source Oral, height 6' (1.829 m), weight (!) 337 lb (152.9 kg), SpO2 98 %. Body mass index is 45.71 kg/m. Physical Exam  Constitutional: He is oriented to person, place, and time. He appears well-developed and well-nourished.  Cardiovascular: Normal rate.   Musculoskeletal: Normal range of motion.  Neurological: He is oriented to person, place, and time.  Skin: Skin is dry.  Psychiatric: He has a normal mood and affect. His behavior is normal.  Vitals reviewed.   RECENT LABS AND TESTS: BMET    Component Value Date/Time   NA 144 11/22/2016 0954   K 4.1 11/22/2016 0954   CL 105 11/22/2016 0954   CO2 24 11/22/2016 0954   GLUCOSE 119 (H) 11/22/2016 0954     GLUCOSE 119 (H) 02/24/2016 1003   BUN 12 11/22/2016 0954   CREATININE 0.98 11/22/2016 0954   CREATININE 1.06 02/24/2016 1003   CALCIUM 8.8 11/22/2016 0954   GFRNONAA 88 11/22/2016 0954   GFRAA 102 11/22/2016 0954   Lab Results  Component Value Date   HGBA1C 6.2 (H) 11/22/2016   HGBA1C 6.0 (H) 08/24/2016   HGBA1C 5.8 (H) 02/24/2016   HGBA1C 6.2 (H) 08/21/2015   HGBA1C 6.1 (H) 08/28/2014   Lab Results  Component Value Date   INSULIN 37.2 (H) 11/22/2016   CBC  Component Value Date/Time   WBC 8.4 11/22/2016 0954   WBC 9.7 11/11/2016 1038   WBC 7.9 02/24/2016 1003   RBC 4.53 11/22/2016 0954   RBC 4.80 11/11/2016 1038   RBC 4.46 02/24/2016 1003   HGB 13.4 11/22/2016 0954   HCT 39.8 11/22/2016 0954   PLT 220 08/24/2016 0953   MCV 88 11/22/2016 0954   MCH 29.6 11/22/2016 0954   MCH 29.3 11/11/2016 1038   MCH 30.0 02/24/2016 1003   MCHC 33.7 11/22/2016 0954   MCHC 33.0 11/11/2016 1038   MCHC 34.3 02/24/2016 1003   RDW 15.0 11/22/2016 0954   LYMPHSABS 1.9 11/22/2016 0954   MONOABS 711 02/24/2016 1003   EOSABS 0.3 11/22/2016 0954   BASOSABS 0.0 11/22/2016 0954   Iron/TIBC/Ferritin/ %Sat No results found for: IRON, TIBC, FERRITIN, IRONPCTSAT Lipid Panel     Component Value Date/Time   CHOL 165 11/22/2016 0954   TRIG 149 11/22/2016 0954   HDL 29 (L) 11/22/2016 0954   CHOLHDL 5.0 08/24/2016 0953   CHOLHDL 5.5 (H) 02/24/2016 1003   VLDL 24 02/24/2016 1003   LDLCALC 106 (H) 11/22/2016 0954   Hepatic Function Panel     Component Value Date/Time   PROT 6.5 11/22/2016 0954   ALBUMIN 3.8 11/22/2016 0954   AST 24 11/22/2016 0954   ALT 21 11/22/2016 0954   ALKPHOS 71 11/22/2016 0954   BILITOT 0.3 11/22/2016 0954   BILIDIR 0.1 12/22/2009 0846      Component Value Date/Time   TSH 4.690 (H) 11/22/2016 0954   TSH 3.360 08/24/2016 0953   TSH 2.68 08/21/2015 0902    ASSESSMENT AND PLAN: Vitamin D deficiency - Plan: Vitamin D, Ergocalciferol, (DRISDOL) 50000 units  CAPS capsule  Prediabetes - Plan: metFORMIN (GLUCOPHAGE) 500 MG tablet  At risk for diabetes mellitus  Class 3 obesity with serious comorbidity and body mass index (BMI) of 45.0 to 49.9 in adult, unspecified obesity type (Gary Williamson)  PLAN:  Vitamin D Deficiency Gary Williamson was informed that low vitamin D levels contributes to fatigue and are associated with obesity, breast, and colon cancer. He agrees to start to take prescription Vit D @50 ,000 IU every week #4 with no refills and will follow up for routine testing of vitamin D, at least 2-3 times per year. He was informed of the risk of over-replacement of vitamin D and agrees to not increase his dose unless he discusses this with [name] first. Gary Williamson agrees to follow up with our clinic in 2 weeks.  Pre-Diabetes Gary Williamson will continue to work on weight loss, exercise, and decreasing simple carbohydrates in his diet to help decrease the risk of diabetes. We dicussed metformin including benefits and risks. He was informed that eating too many simple carbohydrates or too many calories at one sitting increases the likelihood of GI side effects. Gary Williamson agrees to start metformin for now and a prescription was written today for metformin 500 mg every morning #30 with no refills. Gary Williamson agrees to continue with diet and exercise and will follow up with [name] as directed to monitor his progress.  Diabetes risk counselling Gary Williamson was given extended (15 minutes) diabetes prevention counseling today. He is 51 y.o. male and has risk factors for diabetes including obesity and pre-diabetes. We discussed intensive lifestyle modifications today with an emphasis on weight loss as well as increasing exercise and decreasing simple carbohydrates in his diet.  Obesity Kawika is currently in the action stage of change. As such, his goal is to continue with weight loss  efforts He has agreed to follow the Category 3 [place] has been instructed to work up to a goal of 150  minutes of combined cardio and strengthening exercise per week for weight loss and overall health benefits. We discussed the following Behavioral Modification Strategies today: no skipping meals, increasing lean protein intake, decreasing simple carbohydrates  and decrease liquid calories  Hodge has agreed to follow up with our clinic in 2 weeks. He was informed of the importance of frequent follow up visits to maximize his success with intensive lifestyle modifications for his multiple health conditions.  I, Doreene Nest, am acting as transcriptionist for Dennard Nip, MD  I have reviewed the above documentation for accuracy and completeness, and I agree with the above. -Dennard Nip, MD   OBESITY BEHAVIORAL INTERVENTION VISIT  Today's visit was # 2 out of 73.  Starting weight: 340 lbs Starting date: 11/22/16 Today's weight : 337 lbs Today's date: 12/07/2016 Total lbs lost to date: 3 (Patients must lose 7 lbs in the first 6 months to continue with counseling)   ASK: We discussed the diagnosis of obesity with [facility] today and Giovanne agreed to give [name] permission to discuss obesity behavioral modification therapy today.  ASSESS: Lundon has the diagnosis of obesity and his BMI today is 26.8 Saige is in the action stage of change   ADVISE: Valentin was educated on the multiple health risks of obesity as well as the benefit of weight loss to improve his health. He was advised of the need for long term treatment and the importance of lifestyle modifications.  AGREE: Multiple dietary modification options and treatment options were discussed and  Nnamdi agreed to follow the Category 3 plan We discussed the following Behavioral Modification Strategies today: no skipping meals, increasing lean protein intake, decreasing simple carbohydrates  and decrease liquid calories

## 2016-12-21 ENCOUNTER — Ambulatory Visit (INDEPENDENT_AMBULATORY_CARE_PROVIDER_SITE_OTHER): Payer: BLUE CROSS/BLUE SHIELD | Admitting: Physician Assistant

## 2016-12-21 VITALS — BP 104/70 | HR 79 | Temp 97.8°F | Ht 72.0 in | Wt 324.0 lb

## 2016-12-21 DIAGNOSIS — E559 Vitamin D deficiency, unspecified: Secondary | ICD-10-CM | POA: Diagnosis not present

## 2016-12-21 DIAGNOSIS — R7303 Prediabetes: Secondary | ICD-10-CM

## 2016-12-21 DIAGNOSIS — E669 Obesity, unspecified: Secondary | ICD-10-CM | POA: Diagnosis not present

## 2016-12-21 DIAGNOSIS — IMO0001 Reserved for inherently not codable concepts without codable children: Secondary | ICD-10-CM

## 2016-12-21 DIAGNOSIS — Z6841 Body Mass Index (BMI) 40.0 and over, adult: Secondary | ICD-10-CM

## 2016-12-21 MED ORDER — VITAMIN D (ERGOCALCIFEROL) 1.25 MG (50000 UNIT) PO CAPS: 50000.0000 [IU] | ORAL_CAPSULE | ORAL | 0 refills | Status: DC

## 2016-12-21 MED ORDER — METFORMIN HCL 500 MG PO TABS: 500.0000 mg | ORAL_TABLET | Freq: Every morning | ORAL | 0 refills | Status: DC

## 2016-12-21 NOTE — Progress Notes (Addendum)
Office: [phone]  /  Fax: [phone]   HPI:   Chief Complaint: Gary Williamson is here to discuss his progress with his obesity treatment plan. He is on the follow the Category 3 plan and is following his eating plan approximately 75 % of the time. He states he is walking for 20 to 30 minutes 2 times per week. Gary Williamson continues to do well with weight loss. He plans ahead well and states hunger is well controlled. His weight is (!) 324 lb (147 kg) today and has had a weight loss of 13 pounds over a period of 2 weeks since his last visit. He has lost 16 lbs since starting treatment with Gary Williamson.  Vitamin D deficiency Gary Williamson has a diagnosis of vitamin D deficiency. He is currently taking vit D and denies nausea, vomiting or muscle weakness.  Gary Williamson has a diagnosis of pre-diabetes based on his elevated Hgb A1c and was informed this puts him at greater risk of developing diabetes. He is taking metformin currently and continues to work on diet and exercise to decrease risk of diabetes. He denies nausea, polyphagia or hypoglycemia.   ALLERGIES: No Known Allergies  MEDICATIONS: Current Outpatient Prescriptions on File Prior to Visit  Medication Sig Dispense Refill  . aspirin 81 MG EC tablet Take 1 tablet (81 mg total) by mouth daily. 30 tablet 2  . carvedilol (COREG) 25 MG tablet TAKE 1 BY MOUTH TWICE DAILY 180 tablet 3  . Cholecalciferol (VITAMIN D) 2000 units CAPS Take by mouth daily.    . clobetasol ointment (TEMOVATE) 1.24 % Apply 1 application topically 2 (two) times daily. 30 g 0  . ENTRESTO 97-103 MG TAKE ONE TABLET BY MOUTH TWICE DAILY 60 tablet 11  . fish oil-omega-3 fatty acids 1000 MG capsule Take 1 capsule (1 g total) by mouth daily. 30 capsule 2  . furosemide (LASIX) 40 MG tablet Take 1 tablet (40 mg total) by mouth daily. 90 tablet 3  . Multiple Vitamin (MULTIVITAMIN) tablet Take 1 tablet by mouth daily.      . naproxen (NAPROSYN) 500 MG tablet Take 1 tablet  (500 mg total) by mouth 2 (two) times daily with a meal. 30 tablet 0  . simvastatin (ZOCOR) 40 MG tablet Take 1 tablet (40 mg total) by mouth every evening. 90 tablet 3  . spironolactone (ALDACTONE) 25 MG tablet Take 1 tablet (25 mg total) by mouth daily. 90 tablet 3   No current facility-administered medications on file prior to visit.     PAST MEDICAL HISTORY: Past Medical History:  Diagnosis Date  . Acute idiopathic pericarditis 03/24/2010   Overview:  Viral Idiopathic Pericarditis   . Chronic systolic heart failure ([place])   . Hypertension   . LBBB (left bundle branch block)    Chronic  . Leg edema   . NICM (nonischemic cardiomyopathy) ([place])    Probably nonischemic; LHC in 2002/03 no sig CAD per report (done in [place]). Cardiomyopathy for 9-10 yrs. 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 Echo 5/11: severe LVE, EF <20%, diff HK, mod dias dys, sev. LAE. Hosp for CHF in 5/11. Ad. MV 6/11: EF 21%, mild per-infart ischemia. LHC (7/11): EF 15%, no CAD, LVEDP 22; Echo (10/11): EF 20-25%;  Echo (12/12):  EF 20%, mod LVH, Gr 1 DD, diff HK, mild LAE  . OSA (obstructive sleep apnea)    cpap used  . Pure hypercholesterolemia   . Severe obesity ([place])   . Vitamin D deficiency  PAST SURGICAL HISTORY: Past Surgical History:  Procedure Laterality Date  . BI-VENTRICULAR IMPLANTABLE CARDIOVERTER DEFIBRILLATOR N/A 08/21/2013   Procedure: BI-VENTRICULAR IMPLANTABLE CARDIOVERTER DEFIBRILLATOR  (CRT-D);  Surgeon: Coralyn Mark, MD;  Location[facility] CATH LAB;  Service: Cardiovascular;  Laterality: N/A;  . CARDIAC CATHETERIZATION  2002 or 2003  . COLONOSCOPY N/A 09/16/2014   Procedure: COLONOSCOPY;  Surgeon: [name], MD;  Location: [facility] ENDOSCOPY;  Service: Endoscopy;  Laterality: N/A;  . LEFT HEART CATH  11/13/09   Dr [name]    SOCIAL HISTORY: Social History  Substance Use Topics  . Smoking status: Never Smoker  . Smokeless tobacco: Never Used  . Alcohol use 0.0 - 1.0 oz/week     Comment: rare to  occ.    FAMILY HISTORY: Family History  Problem Relation Age of Onset  . Hypertension Mother   . Alzheimer's disease Mother   . Hyperlipidemia Mother   . Breast cancer Mother   . Cancer Mother        breast cancer  . Stroke Maternal Grandmother   . Arthritis Father   . Dementia Father   . Hypertension Father   . Alzheimer's disease Father   . COPD Brother   . Coronary artery disease Neg Hx   . Heart failure Neg Hx   . Heart disease Neg Hx     ROS: Review of Systems  Constitutional: Positive for weight loss.  Gastrointestinal: Negative for nausea and vomiting.  Musculoskeletal:       Negative muscle weakness  Endo/Heme/Allergies:       Negative hypoglycemia Negative polyphagia     PHYSICAL EXAM: Blood pressure 104/70, pulse 79, temperature 97.8 F (36.6 C), temperature source Oral, height 6' (1.829 m), weight (!) 324 lb (147 kg), SpO2 97 %. Body mass index is 43.94 kg/m. Physical Exam  Constitutional: He is oriented to person, place, and time. He appears well-developed and well-nourished.  Cardiovascular: Normal rate.   Pulmonary/Chest: Effort normal.  Musculoskeletal: Normal range of motion.  Neurological: He is oriented to person, place, and time.  Skin: Skin is warm and dry.  Psychiatric: He has a normal mood and affect. His behavior is normal.  Vitals reviewed.   RECENT LABS AND TESTS: BMET    Component Value Date/Time   NA 144 11/22/2016 0954   K 4.1 11/22/2016 0954   CL 105 11/22/2016 0954   CO2 24 11/22/2016 0954   GLUCOSE 119 (H) 11/22/2016 0954   GLUCOSE 119 (H) 02/24/2016 1003   BUN 12 11/22/2016 0954   CREATININE 0.98 11/22/2016 0954   CREATININE 1.06 02/24/2016 1003   CALCIUM 8.8 11/22/2016 0954   GFRNONAA 88 11/22/2016 0954   GFRAA 102 11/22/2016 0954   Lab Results  Component Value Date   HGBA1C 6.2 (H) 11/22/2016   HGBA1C 6.0 (H) 08/24/2016   HGBA1C 5.8 (H) 02/24/2016   HGBA1C 6.2 (H) 08/21/2015   HGBA1C 6.1 (H) 08/28/2014   Lab  Results  Component Value Date   INSULIN 37.2 (H) 11/22/2016   CBC    Component Value Date/Time   WBC 8.4 11/22/2016 0954   WBC 9.7 11/11/2016 1038   WBC 7.9 02/24/2016 1003   RBC 4.53 11/22/2016 0954   RBC 4.80 11/11/2016 1038   RBC 4.46 02/24/2016 1003   HGB 13.4 11/22/2016 0954   HCT 39.8 11/22/2016 0954   PLT 220 08/24/2016 0953   MCV 88 11/22/2016 0954   MCH 29.6 11/22/2016 0954   MCH 29.3 11/11/2016 1038   MCH  30.0 02/24/2016 1003   MCHC 33.7 11/22/2016 0954   MCHC 33.0 11/11/2016 1038   MCHC 34.3 02/24/2016 1003   RDW 15.0 11/22/2016 0954   LYMPHSABS 1.9 11/22/2016 0954   MONOABS 711 02/24/2016 1003   EOSABS 0.3 11/22/2016 0954   BASOSABS 0.0 11/22/2016 0954   Iron/TIBC/Ferritin/ %Sat No results found for: IRON, TIBC, FERRITIN, IRONPCTSAT Lipid Panel     Component Value Date/Time   CHOL 165 11/22/2016 0954   TRIG 149 11/22/2016 0954   HDL 29 (L) 11/22/2016 0954   CHOLHDL 5.0 08/24/2016 0953   CHOLHDL 5.5 (H) 02/24/2016 1003   VLDL 24 02/24/2016 1003   LDLCALC 106 (H) 11/22/2016 0954   Hepatic Function Panel     Component Value Date/Time   PROT 6.5 11/22/2016 0954   ALBUMIN 3.8 11/22/2016 0954   AST 24 11/22/2016 0954   ALT 21 11/22/2016 0954   ALKPHOS 71 11/22/2016 0954   BILITOT 0.3 11/22/2016 0954   BILIDIR 0.1 12/22/2009 0846      Component Value Date/Time   TSH 4.690 (H) 11/22/2016 0954   TSH 3.360 08/24/2016 0953   TSH 2.68 08/21/2015 0902    ASSESSMENT AND PLAN: Prediabetes - Plan: metFORMIN (GLUCOPHAGE) 500 MG tablet  Vitamin D deficiency - Plan: Vitamin D, Ergocalciferol, (DRISDOL) 50000 units CAPS capsule  Class 3 obesity with serious comorbidity and body mass index (BMI) of 40.0 to 44.9 in adult, unspecified obesity type ([place])  PLAN:  Vitamin D Deficiency Gary Williamson was informed that low vitamin D levels contributes to fatigue and are associated with obesity, breast, and colon cancer. He agrees to continue to take prescription Vit D  @50 ,000 IU every week, we will refill for 1 month and will follow up for routine testing of vitamin D, at least 2-3 times per year. He was informed of the risk of over-replacement of vitamin D and agrees to not increase his dose unless he discusses this with Gary Williamson first. Gary Williamson agrees to follow up with our clinic in 2 weeks.  Pre-Diabetes Gary Williamson will continue to work on weight loss, exercise, and decreasing simple carbohydrates in his diet to help decrease the risk of diabetes. We dicussed metformin including benefits and risks. He was informed that eating too many simple carbohydrates or too many calories at one sitting increases the likelihood of GI side effects. Gary Williamson agrees to continue metformin for now and a prescription was written today for 1 month refill. Gary Williamson agreed to follow up with Gary Williamson as directed to monitor his progress.  Obesity Gary Williamson is currently in the action stage of change. As such, his goal is to continue with weight loss efforts He has agreed to follow the Category 3 Gary Williamson has been instructed to work up to a goal of 150 minutes of combined cardio and strengthening exercise per week for weight loss and overall health benefits. We discussed the following Behavioral Modification Strategies today: increasing lean protein intake and meal planning & cooking strategies  Gary Williamson has agreed to follow up with our clinic in 2 weeks. He was informed of the importance of frequent follow up visits to maximize his success with intensive lifestyle modifications for his multiple health conditions.  Gary Williamson, Gary Williamson, am acting as transcriptionist for Gary Duverney, PA-C  Gary Williamson have reviewed the above documentation for accuracy and completeness, and Gary Williamson agree with the above. -Gary Duverney, PA-C  OBESITY BEHAVIORAL INTERVENTION VISIT  Today's visit was # 3 out of 22.  Starting weight: 340 lbs Starting date: 11/22/16 Today's  weight : 324 lbs Today's date: 12/21/2016 Total lbs lost to date:  16 (Patients must lose 7 lbs in the first 6 months to continue with counseling)   ASK: We discussed the diagnosis of obesity with Gary Williamson today and Gary Williamson agreed to give Gary Williamson permission to discuss obesity behavioral modification therapy today.  ASSESS: Gary Williamson has the diagnosis of obesity and his BMI today is 43.93 Gary Williamson is in the action stage of change   ADVISE: Gary Williamson was educated on the multiple health risks of obesity as well as the benefit of weight loss to improve his health. He was advised of the need for long term treatment and the importance of lifestyle modifications.  AGREE: Multiple dietary modification options and treatment options were discussed and  Gary Williamson agreed to follow the Category 3 plan We discussed the following Behavioral Modification Strategies today: increasing lean protein intake and meal planning & cooking strategies

## 2016-12-30 ENCOUNTER — Telehealth: Payer: Self-pay | Admitting: Family Medicine

## 2017-01-01 NOTE — Telephone Encounter (Signed)
He has been on this combination safely for over 2 years. He is due to see me and overdue to see Dr [name]. I dont think we need to make changes now. I will have our office schedule follow-up.  Thanks!  [name] MD, [facility] 01/01/2017 2:55 PM

## 2017-01-01 NOTE — Telephone Encounter (Signed)
[facility] CALLING TO LET DR Tamala Julian THAT PT IS TAKING SPIRONOLACTONE AND ENTRESTO TOGETHER AND WANT TO KNOW IF THAT IS STILL OK FOR PT TO TAKE THE TWO TOGETHER.   -Potassium levels stable for past several years on current regimen.  Will forward to Dr. [name] of cardiology for [facility] as he prescribes Entresto and initiated Spironolactone as well.

## 2017-01-05 ENCOUNTER — Encounter (HOSPITAL_COMMUNITY): Payer: Self-pay | Admitting: Vascular Surgery

## 2017-01-06 ENCOUNTER — Ambulatory Visit (INDEPENDENT_AMBULATORY_CARE_PROVIDER_SITE_OTHER): Payer: BLUE CROSS/BLUE SHIELD | Admitting: *Deleted

## 2017-01-06 ENCOUNTER — Ambulatory Visit (INDEPENDENT_AMBULATORY_CARE_PROVIDER_SITE_OTHER): Payer: BLUE CROSS/BLUE SHIELD | Admitting: Physician Assistant

## 2017-01-06 ENCOUNTER — Telehealth: Payer: Self-pay

## 2017-01-06 VITALS — BP 102/66 | HR 75 | Temp 97.9°F | Ht 72.0 in | Wt 326.0 lb

## 2017-01-06 DIAGNOSIS — Z6841 Body Mass Index (BMI) 40.0 and over, adult: Secondary | ICD-10-CM | POA: Diagnosis not present

## 2017-01-06 DIAGNOSIS — I5022 Chronic systolic (congestive) heart failure: Secondary | ICD-10-CM | POA: Diagnosis not present

## 2017-01-06 DIAGNOSIS — Z95810 Presence of automatic (implantable) cardiac defibrillator: Secondary | ICD-10-CM | POA: Diagnosis not present

## 2017-01-06 DIAGNOSIS — E669 Obesity, unspecified: Secondary | ICD-10-CM | POA: Diagnosis not present

## 2017-01-06 DIAGNOSIS — E559 Vitamin D deficiency, unspecified: Secondary | ICD-10-CM

## 2017-01-06 DIAGNOSIS — I428 Other cardiomyopathies: Secondary | ICD-10-CM

## 2017-01-06 DIAGNOSIS — IMO0001 Reserved for inherently not codable concepts without codable children: Secondary | ICD-10-CM

## 2017-01-06 NOTE — Progress Notes (Signed)
EPIC Encounter for ICM Monitoring  Patient Name: Gary Williamson is a 51 y.o. male Date: 01/06/2017 Primary Care Physican: Wardell Honour, MD Primary Cardiologist: [name] Electrophysiologist: [name] Dry Weight:unknown Bi-V Pacing: 98%      Attempted call to patient and unable to reach.  Left detailed message regarding transmission.  Transmission reviewed.    Thoracic impedance normal but was abnormal suggesting fluid accumulation from 8/21 to 9/1.  Prescribed dosage: Furosemide 40 mg 1 tablet daily  Labs: 11/22/2016 Creatinine 0.98, BUN 12, Potassium 4.1, Sodium 144, EGFR 88-102 08/24/2016 Creatinine 1.13, BUN 12, Potassium 4.4, Sodium 142  Recommendations: Left voice mail with ICM number and encouraged to call for fluid symptoms.  Follow-up plan: ICM clinic phone appointment on 02/24/2017.  Office appointment scheduled 01/24/2017 with Dr. [name] and 02/01/2017 with Dr [name].  Copy of ICM check sent to Dr. [name].   3 month ICM trend: 01/06/2017   1 Year ICM trend:      [name], RN 01/06/2017 2:09 PM

## 2017-01-06 NOTE — Telephone Encounter (Signed)
Remote ICM transmission received.  Attempted call to patient and left detailed message regarding transmission and next ICM scheduled for 02/24/2017.  Advised to return call for any fluid symptoms or questions.

## 2017-01-07 NOTE — Progress Notes (Signed)
Remote ICD transmission.   

## 2017-01-10 NOTE — Progress Notes (Signed)
Office: [phone]  /  Fax: [phone]   HPI:   Chief Complaint: Gary Williamson is here to discuss his progress with his obesity treatment plan. He is on the Category 3 plan and is following his eating plan approximately 50 % of the time. He states he is exercising walking 20 to 30 minutes 2 times per week. Gary Williamson had increased celebration eating and traveled for work, which made it harder to plan ahead. He states he wants more travel eating ideas.  His weight is (!) 326 lb (147.9 kg) today and has gained 2 pounds since his last visit. He has lost 14 lbs since starting treatment with [name].  Vitamin D deficiency Gary Williamson has a diagnosis of vitamin D deficiency. He is currently taking prescription Vit D and denies nausea, vomiting or muscle weakness.  ALLERGIES: No Known Allergies  MEDICATIONS: Current Outpatient Prescriptions on File Prior to Visit  Medication Sig Dispense Refill  . aspirin 81 MG EC tablet Take 1 tablet (81 mg total) by mouth daily. 30 tablet 2  . carvedilol (COREG) 25 MG tablet TAKE 1 BY MOUTH TWICE DAILY 180 tablet 3  . Cholecalciferol (VITAMIN D) 2000 units CAPS Take by mouth daily.    . clobetasol ointment (TEMOVATE) 6.57 % Apply 1 application topically 2 (two) times daily. 30 g 0  . ENTRESTO 97-103 MG TAKE ONE TABLET BY MOUTH TWICE DAILY 60 tablet 11  . fish oil-omega-3 fatty acids 1000 MG capsule Take 1 capsule (1 g total) by mouth daily. 30 capsule 2  . furosemide (LASIX) 40 MG tablet Take 1 tablet (40 mg total) by mouth daily. 90 tablet 3  . metFORMIN (GLUCOPHAGE) 500 MG tablet Take 1 tablet (500 mg total) by mouth every morning. 30 tablet 0  . Multiple Vitamin (MULTIVITAMIN) tablet Take 1 tablet by mouth daily.      . naproxen (NAPROSYN) 500 MG tablet Take 1 tablet (500 mg total) by mouth 2 (two) times daily with a meal. 30 tablet 0  . simvastatin (ZOCOR) 40 MG tablet Take 1 tablet (40 mg total) by mouth every evening. 90 tablet 3  . spironolactone  (ALDACTONE) 25 MG tablet Take 1 tablet (25 mg total) by mouth daily. 90 tablet 3  . Vitamin D, Ergocalciferol, (DRISDOL) 50000 units CAPS capsule Take 1 capsule (50,000 Units total) by mouth every 7 (seven) days. 4 capsule 0   No current facility-administered medications on file prior to visit.     PAST MEDICAL HISTORY: Past Medical History:  Diagnosis Date  . Acute idiopathic pericarditis 03/24/2010   Overview:  Viral Idiopathic Pericarditis   . Chronic systolic heart failure ([place])   . Hypertension   . LBBB (left bundle branch block)    Chronic  . Leg edema   . NICM (nonischemic cardiomyopathy) ([place])    Probably nonischemic; LHC in 2002/03 no sig CAD per report (done in [place]). Cardiomyopathy for 9-10 yrs. 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 Echo 5/11: severe LVE, EF <20%, diff HK, mod dias dys, sev. LAE. Hosp for CHF in 5/11. Ad. MV 6/11: EF 21%, mild per-infart ischemia. LHC (7/11): EF 15%, no CAD, LVEDP 22; Echo (10/11): EF 20-25%;  Echo (12/12):  EF 20%, mod LVH, Gr 1 DD, diff HK, mild LAE  . OSA (obstructive sleep apnea)    cpap used  . Pure hypercholesterolemia   . Severe obesity ([place])   . Vitamin D deficiency     PAST SURGICAL HISTORY: Past Surgical History:  Procedure Laterality  Date  . BI-VENTRICULAR IMPLANTABLE CARDIOVERTER DEFIBRILLATOR N/A 08/21/2013   Procedure: BI-VENTRICULAR IMPLANTABLE CARDIOVERTER DEFIBRILLATOR  (CRT-D);  Surgeon: Coralyn Mark, MD;  [facility] C[facility];  Service: Cardiovascular;  Laterality: N/A;  . CARDIAC CATHETERIZATION  2002 or 2003  . COLONOSCOPY N/A 09/16/2014   Procedure: COLONOSCOPY;  Surgeon: [name], MD;  Location: [facility] ENDOSCOPY;  Service: Endoscopy;  Laterality: N/A;  . LEFT HEART CATH  11/13/09   Dr [name]    SOCIAL HISTORY: Social History  Substance Use Topics  . Smoking status: Never Smoker  . Smokeless tobacco: Never Used  . Alcohol use 0.0 - 1.0 oz/week     Comment: rare to occ.    FAMILY HISTORY: Family History    Problem Relation Age of Onset  . Hypertension Mother   . Alzheimer's disease Mother   . Hyperlipidemia Mother   . Breast cancer Mother   . Cancer Mother        breast cancer  . Stroke Maternal Grandmother   . Arthritis Father   . Dementia Father   . Hypertension Father   . Alzheimer's disease Father   . COPD Brother   . Coronary artery disease Neg Hx   . Heart failure Neg Hx   . Heart disease Neg Hx     ROS: Review of Systems  Constitutional: Negative for weight loss.  Gastrointestinal: Negative for nausea and vomiting.  Musculoskeletal:       Negative muscle weakness    PHYSICAL EXAM: Blood pressure 102/66, pulse 75, temperature 97.9 F (36.6 C), temperature source Oral, height 6' (1.829 m), weight (!) 326 lb (147.9 kg), SpO2 97 %. Body mass index is 44.21 kg/m. Physical Exam  Constitutional: He is oriented to person, place, and time. He appears well-developed and well-nourished.  Cardiovascular: Normal rate.   Pulmonary/Chest: Effort normal.  Musculoskeletal: Normal range of motion.  Neurological: He is oriented to person, place, and time.  Skin: Skin is warm and dry.  Psychiatric: He has a normal mood and affect. His behavior is normal.  Vitals reviewed.   RECENT LABS AND TESTS: BMET    Component Value Date/Time   NA 144 11/22/2016 0954   K 4.1 11/22/2016 0954   CL 105 11/22/2016 0954   CO2 24 11/22/2016 0954   GLUCOSE 119 (H) 11/22/2016 0954   GLUCOSE 119 (H) 02/24/2016 1003   BUN 12 11/22/2016 0954   CREATININE 0.98 11/22/2016 0954   CREATININE 1.06 02/24/2016 1003   CALCIUM 8.8 11/22/2016 0954   GFRNONAA 88 11/22/2016 0954   GFRAA 102 11/22/2016 0954   Lab Results  Component Value Date   HGBA1C 6.2 (H) 11/22/2016   HGBA1C 6.0 (H) 08/24/2016   HGBA1C 5.8 (H) 02/24/2016   HGBA1C 6.2 (H) 08/21/2015   HGBA1C 6.1 (H) 08/28/2014   Lab Results  Component Value Date   INSULIN 37.2 (H) 11/22/2016   CBC    Component Value Date/Time   WBC 8.4  11/22/2016 0954   WBC 9.7 11/11/2016 1038   WBC 7.9 02/24/2016 1003   RBC 4.53 11/22/2016 0954   RBC 4.80 11/11/2016 1038   RBC 4.46 02/24/2016 1003   HGB 13.4 11/22/2016 0954   HCT 39.8 11/22/2016 0954   PLT 220 08/24/2016 0953   MCV 88 11/22/2016 0954   MCH 29.6 11/22/2016 0954   MCH 29.3 11/11/2016 1038   MCH 30.0 02/24/2016 1003   MCHC 33.7 11/22/2016 0954   MCHC 33.0 11/11/2016 1038   MCHC 34.3 02/24/2016 1003  RDW 15.0 11/22/2016 0954   LYMPHSABS 1.9 11/22/2016 0954   MONOABS 711 02/24/2016 1003   EOSABS 0.3 11/22/2016 0954   BASOSABS 0.0 11/22/2016 0954   Iron/TIBC/Ferritin/ %Sat No results found for: IRON, TIBC, FERRITIN, IRONPCTSAT Lipid Panel     Component Value Date/Time   CHOL 165 11/22/2016 0954   TRIG 149 11/22/2016 0954   HDL 29 (L) 11/22/2016 0954   CHOLHDL 5.0 08/24/2016 0953   CHOLHDL 5.5 (H) 02/24/2016 1003   VLDL 24 02/24/2016 1003   LDLCALC 106 (H) 11/22/2016 0954   Hepatic Function Panel     Component Value Date/Time   PROT 6.5 11/22/2016 0954   ALBUMIN 3.8 11/22/2016 0954   AST 24 11/22/2016 0954   ALT 21 11/22/2016 0954   ALKPHOS 71 11/22/2016 0954   BILITOT 0.3 11/22/2016 0954   BILIDIR 0.1 12/22/2009 0846      Component Value Date/Time   TSH 4.690 (H) 11/22/2016 0954   TSH 3.360 08/24/2016 0953   TSH 2.68 08/21/2015 0902    ASSESSMENT AND PLAN: Vitamin D deficiency  Class 3 obesity with serious comorbidity and body mass index (BMI) of 40.0 to 44.9 in adult, unspecified obesity type (HCC)  PLAN:  Vitamin D Deficiency Gary Williamson was informed that low vitamin D levels contributes to fatigue and are associated with obesity, breast, and colon cancer. He agrees to continue taking prescription Vit D @50 ,000 IU every week and will follow up for routine testing of vitamin D, at least 2-3 times per year. He was informed of the risk of over-replacement of vitamin D and agrees to not increase his dose unless he discusses this with [name] first.  Gary Williamson agrees to follow up with our clinic in 2 weeks.  We spent > than 50% of the 15 minute visit on the counseling as documented in the note.  Obesity Gary Williamson is currently in the action stage of change. As such, his goal is to continue with weight loss efforts He has agreed to follow the Category 3 Gary Williamson has been instructed to work up to a goal of 150 minutes of combined cardio and strengthening exercise per week for weight loss and overall health benefits. We discussed the following Behavioral Modification Strategies today: increasing lean protein intake and travel eating strategies   Madox has agreed to follow up with our clinic in 2 weeks. He was informed of the importance of frequent follow up visits to maximize his success with intensive lifestyle modifications for his multiple health conditions.  I, Gary Williamson, am acting as transcriptionist for Gary Duverney, PA-C  I have reviewed the above documentation for accuracy and completeness, and I agree with the above. -Gary Duverney, PA-C  I have reviewed the above note and agree with the plan. -Gary Nip, MD   OBESITY BEHAVIORAL INTERVENTION VISIT  Today's visit was # 4 out of 22.  Starting weight: 340 lbs Starting date: 11/22/16 Today's weight: 326 lbs Today's date: 01/06/17 Total lbs lost to date: 14 (Patients must lose 7 lbs in the first 6 months to continue with counseling)   ASK: We discussed the diagnosis of obesity with Gary Williamson today and Gary Williamson agreed to give [name] permission to discuss obesity behavioral modification therapy today.  ASSESS: Gary Williamson has the diagnosis of obesity and his BMI today is 77 Gary Williamson is in the action stage of change   ADVISE: Marcellius was educated on the multiple health risks of obesity as well as the benefit of weight loss to improve his health.  He was advised of the need for long term treatment and the importance of lifestyle modifications.  AGREE: Multiple dietary  modification options and treatment options were discussed and  Ladarius agreed to follow the Category 3 plan We discussed the following Behavioral Modification Strategies today: increasing lean protein intake and travel eating strategies

## 2017-01-11 ENCOUNTER — Encounter: Payer: Self-pay | Admitting: Cardiology

## 2017-01-19 ENCOUNTER — Ambulatory Visit (INDEPENDENT_AMBULATORY_CARE_PROVIDER_SITE_OTHER): Payer: BLUE CROSS/BLUE SHIELD | Admitting: Physician Assistant

## 2017-01-19 VITALS — BP 104/67 | HR 74 | Temp 97.9°F | Ht 72.0 in | Wt 328.0 lb

## 2017-01-19 DIAGNOSIS — E559 Vitamin D deficiency, unspecified: Secondary | ICD-10-CM | POA: Diagnosis not present

## 2017-01-19 DIAGNOSIS — Z6841 Body Mass Index (BMI) 40.0 and over, adult: Secondary | ICD-10-CM

## 2017-01-19 DIAGNOSIS — IMO0001 Reserved for inherently not codable concepts without codable children: Secondary | ICD-10-CM

## 2017-01-19 DIAGNOSIS — E669 Obesity, unspecified: Secondary | ICD-10-CM

## 2017-01-19 DIAGNOSIS — R7303 Prediabetes: Secondary | ICD-10-CM | POA: Diagnosis not present

## 2017-01-19 MED ORDER — VITAMIN D (ERGOCALCIFEROL) 1.25 MG (50000 UNIT) PO CAPS: 50000.0000 [IU] | ORAL_CAPSULE | ORAL | 0 refills | Status: DC

## 2017-01-20 NOTE — Progress Notes (Signed)
Office: [phone]  /  Fax: [phone]   HPI:   Chief Complaint: Gary Williamson is here to discuss his progress with his obesity treatment plan. He is on the Category 3 plan and is following his eating plan approximately 50 % of the time. He states he is walking for 20 minutes 2 times per week. Gary Williamson was traveling for work and had a hard time following the plan. He is motivated about weight loss and is ready to get back on track. His weight is (!) 328 lb (148.8 kg) today and has had a weight gain of 2 lbs over a period of 2 weeks since his last visit. He has lost 12 lbs since starting treatment with Gary Williamson.  Vitamin D deficiency Gary Williamson has a diagnosis of vitamin D deficiency. He is currently taking vit D and denies nausea, vomiting or muscle weakness.  Gary Williamson has a diagnosis of pre-diabetes based on his elevated Hgb A1c and was informed this puts him at greater risk of developing diabetes. He is taking metformin currently and continues to work on diet and exercise to decrease risk of diabetes. He denies nausea, polyphagia or hypoglycemia.    ALLERGIES: No Known Allergies  MEDICATIONS: Current Outpatient Prescriptions on File Prior to Visit  Medication Sig Dispense Refill  . aspirin 81 MG EC tablet Take 1 tablet (81 mg total) by mouth daily. 30 tablet 2  . carvedilol (COREG) 25 MG tablet TAKE 1 BY MOUTH TWICE DAILY 180 tablet 3  . Cholecalciferol (VITAMIN D) 2000 units CAPS Take by mouth daily.    . clobetasol ointment (TEMOVATE) 8.31 % Apply 1 application topically 2 (two) times daily. 30 g 0  . ENTRESTO 97-103 MG TAKE ONE TABLET BY MOUTH TWICE DAILY 60 tablet 11  . fish oil-omega-3 fatty acids 1000 MG capsule Take 1 capsule (1 g total) by mouth daily. 30 capsule 2  . furosemide (LASIX) 40 MG tablet Take 1 tablet (40 mg total) by mouth daily. 90 tablet 3  . metFORMIN (GLUCOPHAGE) 500 MG tablet Take 1 tablet (500 mg total) by mouth every morning. 30 tablet 0  .  Multiple Vitamin (MULTIVITAMIN) tablet Take 1 tablet by mouth daily.      . naproxen (NAPROSYN) 500 MG tablet Take 1 tablet (500 mg total) by mouth 2 (two) times daily with a meal. 30 tablet 0  . simvastatin (ZOCOR) 40 MG tablet Take 1 tablet (40 mg total) by mouth every evening. 90 tablet 3  . spironolactone (ALDACTONE) 25 MG tablet Take 1 tablet (25 mg total) by mouth daily. 90 tablet 3   No current facility-administered medications on file prior to visit.     PAST MEDICAL HISTORY: Past Medical History:  Diagnosis Date  . Acute idiopathic pericarditis 03/24/2010   Overview:  Viral Idiopathic Pericarditis   . Chronic systolic heart failure ([place])   . Hypertension   . LBBB (left bundle branch block)    Chronic  . Leg edema   . NICM (nonischemic cardiomyopathy) ([place])    Probably nonischemic; LHC in 2002/03 no sig CAD per report (done in [place]). Cardiomyopathy for 9-10 yrs. 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 Echo 5/11: severe LVE, EF <20%, diff HK, mod dias dys, sev. LAE. Hosp for CHF in 5/11. Ad. MV 6/11: EF 21%, mild per-infart ischemia. LHC (7/11): EF 15%, no CAD, LVEDP 22; Echo (10/11): EF 20-25%;  Echo (12/12):  EF 20%, mod LVH, Gr 1 DD, diff HK, mild LAE  . OSA (obstructive  sleep apnea)    cpap used  . Pure hypercholesterolemia   . Severe obesity (Gary Williamson)   . Vitamin D deficiency     PAST SURGICAL HISTORY: Past Surgical History:  Procedure Laterality Date  . BI-VENTRICULAR IMPLANTABLE CARDIOVERTER DEFIBRILLATOR N/A 08/21/2013   Procedure: BI-VENTRICULAR IMPLANTABLE CARDIOVERTER DEFIBRILLATOR  (CRT-D);  Surgeon: Coralyn Mark, MD;  Location: [facility] CATH LAB;  Service: Cardiovascular;  Laterality: N/A;  . CARDIAC CATHETERIZATION  2002 or 2003  . COLONOSCOPY N/A 09/16/2014   Procedure: COLONOSCOPY;  Surgeon: [name], MD;  Location: [facility] ENDOSCOPY;  Service: Endoscopy;  Laterality: N/A;  . LEFT HEART CATH  11/13/09   Dr [name]    SOCIAL HISTORY: Social History  Substance Use  Topics  . Smoking status: Never Smoker  . Smokeless tobacco: Never Used  . Alcohol use 0.0 - 1.0 oz/week     Comment: rare to occ.    FAMILY HISTORY: Family History  Problem Relation Age of Onset  . Hypertension Mother   . Alzheimer's disease Mother   . Hyperlipidemia Mother   . Breast cancer Mother   . Cancer Mother        breast cancer  . Stroke Maternal Grandmother   . Arthritis Father   . Dementia Father   . Hypertension Father   . Alzheimer's disease Father   . COPD Brother   . Coronary artery disease Neg Hx   . Heart failure Neg Hx   . Heart disease Neg Hx     ROS: Review of Systems  Constitutional: Negative for weight loss.  Gastrointestinal: Negative for nausea and vomiting.  Musculoskeletal:       Negative muscle weakness  Endo/Heme/Allergies:       Negative polyphagia Negative hypoglycemia    PHYSICAL EXAM: Blood pressure 104/67, pulse 74, temperature 97.9 F (36.6 C), height 6' (1.829 m), weight (!) 328 lb (148.8 kg), SpO2 96 %. Body mass index is 44.48 kg/m. Physical Exam  Constitutional: He is oriented to person, place, and time. He appears well-developed and well-nourished.  Cardiovascular: Normal rate.   Pulmonary/Chest: Effort normal.  Musculoskeletal: Normal range of motion.  Neurological: He is oriented to person, place, and time.  Skin: Skin is warm and dry.  Psychiatric: He has a normal mood and affect. His behavior is normal.  Vitals reviewed.   RECENT LABS AND TESTS: BMET    Component Value Date/Time   NA 144 11/22/2016 0954   K 4.1 11/22/2016 0954   CL 105 11/22/2016 0954   CO2 24 11/22/2016 0954   GLUCOSE 119 (H) 11/22/2016 0954   GLUCOSE 119 (H) 02/24/2016 1003   BUN 12 11/22/2016 0954   CREATININE 0.98 11/22/2016 0954   CREATININE 1.06 02/24/2016 1003   CALCIUM 8.8 11/22/2016 0954   GFRNONAA 88 11/22/2016 0954   GFRAA 102 11/22/2016 0954   Lab Results  Component Value Date   HGBA1C 6.2 (H) 11/22/2016   HGBA1C 6.0 (H)  08/24/2016   HGBA1C 5.8 (H) 02/24/2016   HGBA1C 6.2 (H) 08/21/2015   HGBA1C 6.1 (H) 08/28/2014   Lab Results  Component Value Date   INSULIN 37.2 (H) 11/22/2016   CBC    Component Value Date/Time   WBC 8.4 11/22/2016 0954   WBC 9.7 11/11/2016 1038   WBC 7.9 02/24/2016 1003   RBC 4.53 11/22/2016 0954   RBC 4.80 11/11/2016 1038   RBC 4.46 02/24/2016 1003   HGB 13.4 11/22/2016 0954   HCT 39.8 11/22/2016 0954   PLT 220  08/24/2016 0953   MCV 88 11/22/2016 0954   MCH 29.6 11/22/2016 0954   MCH 29.3 11/11/2016 1038   MCH 30.0 02/24/2016 1003   MCHC 33.7 11/22/2016 0954   MCHC 33.0 11/11/2016 1038   MCHC 34.3 02/24/2016 1003   RDW 15.0 11/22/2016 0954   LYMPHSABS 1.9 11/22/2016 0954   MONOABS 711 02/24/2016 1003   EOSABS 0.3 11/22/2016 0954   BASOSABS 0.0 11/22/2016 0954   Iron/TIBC/Ferritin/ %Sat No results found for: IRON, TIBC, FERRITIN, IRONPCTSAT Lipid Panel     Component Value Date/Time   CHOL 165 11/22/2016 0954   TRIG 149 11/22/2016 0954   HDL 29 (L) 11/22/2016 0954   CHOLHDL 5.0 08/24/2016 0953   CHOLHDL 5.5 (H) 02/24/2016 1003   VLDL 24 02/24/2016 1003   LDLCALC 106 (H) 11/22/2016 0954   Hepatic Function Panel     Component Value Date/Time   PROT 6.5 11/22/2016 0954   ALBUMIN 3.8 11/22/2016 0954   AST 24 11/22/2016 0954   ALT 21 11/22/2016 0954   ALKPHOS 71 11/22/2016 0954   BILITOT 0.3 11/22/2016 0954   BILIDIR 0.1 12/22/2009 0846      Component Value Date/Time   TSH 4.690 (H) 11/22/2016 0954   TSH 3.360 08/24/2016 0953   TSH 2.68 08/21/2015 0902    ASSESSMENT AND PLAN: Vitamin D deficiency - Plan: Vitamin D, Ergocalciferol, (DRISDOL) 50000 units CAPS capsule  Prediabetes  Class 3 obesity with serious comorbidity and body mass index (BMI) of 40.0 to 44.9 in adult, unspecified obesity type ([place])  PLAN:  Vitamin D Deficiency Eloy was informed that low vitamin D levels contributes to fatigue and are associated with obesity, breast, and  colon cancer. He agrees to continue to take prescription Vit D @50 ,000 IU every week, we will refill for 1 month and will follow up for routine testing of vitamin D, at least 2-3 times per year. He was informed of the risk of over-replacement of vitamin D and agrees to not increase his dose unless he discusses this with Gary Williamson first. Azim agrees to follow up with our clinic in 2 to 3 weeks.  Pre-Diabetes Danen will continue to work on weight loss, exercise, and decreasing simple carbohydrates in his diet to help decrease the risk of diabetes. We dicussed metformin including benefits and risks. He was informed that eating too many simple carbohydrates or too many calories at one sitting increases the likelihood of GI side effects. Lennyn agrees to continue metformin for now and a prescription was not written today. Mister agreed to follow up with Gary Williamson as directed to monitor his progress.  Obesity Laurance is currently in the action stage of change. As such, his goal is to continue with weight loss efforts He has agreed to follow the Category 3 [place] has been instructed to work up to a goal of 150 minutes of combined cardio and strengthening exercise per week for weight loss and overall health benefits. We discussed the following Behavioral Modification Strategies today: increasing lean protein intake and work on meal planning and easy cooking plans  Anikin has agreed to follow up with our clinic in 2 to 3 weeks. He was informed of the importance of frequent follow up visits to maximize his success with intensive lifestyle modifications for his multiple health conditions.  I, Doreene Nest, am acting as transcriptionist for Lacy Duverney, PA-C  I have reviewed the above documentation for accuracy and completeness, and I agree with the above. -Lacy Duverney, PA-C  I have  reviewed the above note and agree with the plan. -Dennard Nip, MD  OBESITY BEHAVIORAL INTERVENTION VISIT  Today's visit  was # 5 out of 22.  Starting weight: 340 lbs Starting date: 11/22/16 Today's weight : 328 lbs Today's date: 01/19/2017 Total lbs lost to date: 12 (Patients must lose 7 lbs in the first 6 months to continue with counseling)   ASK: We discussed the diagnosis of obesity with [facility] today and Tarun agreed to give Gary Williamson permission to discuss obesity behavioral modification therapy today.  ASSESS: Keidan has the diagnosis of obesity and his BMI today is 44.48 [place] is in the action stage of change   ADVISE: Maximus was educated on the multiple health risks of obesity as well as the benefit of weight loss to improve his health. He was advised of the need for long term treatment and the importance of lifestyle modifications.  AGREE: Multiple dietary modification options and treatment options were discussed and  Welford agreed to follow the Category 3 plan We discussed the following Behavioral Modification Strategies today: increasing lean protein intake and work on meal planning and easy cooking plans

## 2017-01-24 ENCOUNTER — Ambulatory Visit (INDEPENDENT_AMBULATORY_CARE_PROVIDER_SITE_OTHER): Payer: BLUE CROSS/BLUE SHIELD | Admitting: Internal Medicine

## 2017-01-24 ENCOUNTER — Encounter: Payer: Self-pay | Admitting: Internal Medicine

## 2017-01-24 VITALS — BP 140/80 | HR 85 | Ht 73.0 in | Wt 335.0 lb

## 2017-01-24 DIAGNOSIS — I110 Hypertensive heart disease with heart failure: Secondary | ICD-10-CM | POA: Diagnosis not present

## 2017-01-24 DIAGNOSIS — I5022 Chronic systolic (congestive) heart failure: Secondary | ICD-10-CM

## 2017-01-24 DIAGNOSIS — I428 Other cardiomyopathies: Secondary | ICD-10-CM | POA: Diagnosis not present

## 2017-01-24 LAB — CUP PACEART INCLINIC DEVICE CHECK
Date Time Interrogation Session: 20180924095243
HIGH POWER IMPEDANCE MEASURED VALUE: 64.125
Implantable Lead Implant Date: 20150421
Implantable Pulse Generator Implant Date: 20150421
Lead Channel Impedance Value: 375 Ohm
Lead Channel Impedance Value: 425 Ohm
Lead Channel Impedance Value: 850 Ohm
Lead Channel Pacing Threshold Amplitude: 0.75 V
Lead Channel Pacing Threshold Amplitude: 0.75 V
Lead Channel Pacing Threshold Amplitude: 0.75 V
Lead Channel Pacing Threshold Amplitude: 0.75 V
Lead Channel Pacing Threshold Pulse Width: 0.5 ms
Lead Channel Pacing Threshold Pulse Width: 0.5 ms
Lead Channel Pacing Threshold Pulse Width: 0.5 ms
Lead Channel Sensing Intrinsic Amplitude: 4.1 mV
Lead Channel Setting Pacing Amplitude: 2 V
Lead Channel Setting Pacing Amplitude: 2 V
Lead Channel Setting Sensing Sensitivity: 0.5 mV
MDC IDC LEAD IMPLANT DT: 20150421
MDC IDC LEAD IMPLANT DT: 20150421
MDC IDC LEAD LOCATION: 753857
MDC IDC LEAD LOCATION: 753859
MDC IDC LEAD LOCATION: 753860
MDC IDC MSMT BATTERY REMAINING LONGEVITY: 45 mo
MDC IDC MSMT LEADCHNL LV PACING THRESHOLD PULSEWIDTH: 0.5 ms
MDC IDC MSMT LEADCHNL RA PACING THRESHOLD AMPLITUDE: 0.75 V
MDC IDC MSMT LEADCHNL RA PACING THRESHOLD PULSEWIDTH: 0.5 ms
MDC IDC MSMT LEADCHNL RV PACING THRESHOLD AMPLITUDE: 0.75 V
MDC IDC MSMT LEADCHNL RV PACING THRESHOLD PULSEWIDTH: 0.5 ms
MDC IDC MSMT LEADCHNL RV SENSING INTR AMPL: 11.9 mV
MDC IDC SET LEADCHNL LV PACING AMPLITUDE: 2 V
MDC IDC SET LEADCHNL LV PACING PULSEWIDTH: 0.5 ms
MDC IDC SET LEADCHNL RV PACING PULSEWIDTH: 0.5 ms
MDC IDC STAT BRADY RA PERCENT PACED: 3.1 %
MDC IDC STAT BRADY RV PERCENT PACED: 98 %
Pulse Gen Serial Number: 7157119

## 2017-01-24 NOTE — Patient Instructions (Signed)
Medication Instructions:  Your physician recommends that you continue on your current medications as directed. Please refer to the Current Medication list given to you today.   Labwork: None ordered   Testing/Procedures: None ordered   Follow-Up: Remote monitoring is used to monitor your  ICD from home. This monitoring reduces the number of office visits required to check your device to one time per year. It allows [name] to keep an eye on the functioning of your device to ensure it is working properly. You are scheduled for a device check from home on 04/07/17. You may send your transmission at any time that day. If you have a wireless device, the transmission will be sent automatically. After your physician reviews your transmission, you will receive a postcard with your next transmission date.  Your physician wants you to follow-up in: 12 months with [place] will receive a reminder letter in the mail two months in advance. If you don't receive a letter, please call our office to schedule the follow-up appointment.     Any Other Special Instructions Will Be Listed Below (If Applicable).     If you need a refill on your cardiac medications before your next appointment, please call your pharmacy.

## 2017-01-24 NOTE — Progress Notes (Signed)
PCP: Wardell Honour, MD Primary Cardiologist:  Dr [name] Primary EP: Dr Magda Kiel Gary Williamson is a 51 y.o. male who presents today for routine electrophysiology followup.  Since last being seen in our clinic, the patient reports doing very well. He continues to work in [employer] for [employer].  He is working on weight loss and is having some progress!  Today, he denies symptoms of palpitations, chest pain, shortness of breath,  lower extremity edema, dizziness, presyncope, syncope, or ICD shocks.  The patient is otherwise without complaint today.   Past Medical History:  Diagnosis Date  . Acute idiopathic pericarditis 03/24/2010   Overview:  Viral Idiopathic Pericarditis   . Chronic systolic heart failure ([place])   . Hypertension   . LBBB (left bundle branch block)    Chronic  . Leg edema   . NICM (nonischemic cardiomyopathy) ([place])    Probably nonischemic; LHC in 2002/03 no sig CAD per report (done in [place]). Cardiomyopathy for 9-10 yrs. 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 Echo 5/11: severe LVE, EF <20%, diff HK, mod dias dys, sev. LAE. Hosp for CHF in 5/11. Ad. MV 6/11: EF 21%, mild per-infart ischemia. LHC (7/11): EF 15%, no CAD, LVEDP 22; Echo (10/11): EF 20-25%;  Echo (12/12):  EF 20%, mod LVH, Gr 1 DD, diff HK, mild LAE  . OSA (obstructive sleep apnea)    cpap used  . Pure hypercholesterolemia   . Severe obesity ([place])   . Vitamin D deficiency    Past Surgical History:  Procedure Laterality Date  . BI-VENTRICULAR IMPLANTABLE CARDIOVERTER DEFIBRILLATOR N/A 08/21/2013   Procedure: BI-VENTRICULAR IMPLANTABLE CARDIOVERTER DEFIBRILLATOR  (CRT-D);  Surgeon: Coralyn Mark, MD;  Location: [facility] CATH LAB;  Service: Cardiovascular;  Laterality: N/A;  . CARDIAC CATHETERIZATION  2002 or 2003  . COLONOSCOPY N/A 09/16/2014   Procedure: COLONOSCOPY;  Surgeon: [name], MD;  Location: [facility] ENDOSCOPY;  Service: Endoscopy;  Laterality: N/A;  . LEFT HEART CATH  11/13/09   Dr [name]    ROS- all systems are  reviewed and negative except as per HPI above  Current Outpatient Prescriptions  Medication Sig Dispense Refill  . aspirin 81 MG EC tablet Take 1 tablet (81 mg total) by mouth daily. 30 tablet 2  . carvedilol (COREG) 25 MG tablet TAKE 1 BY MOUTH TWICE DAILY 180 tablet 3  . Cholecalciferol (VITAMIN D) 2000 units CAPS Take by mouth daily.    . clobetasol ointment (TEMOVATE) 9.02 % Apply 1 application topically 2 (two) times daily. 30 g 0  . ENTRESTO 97-103 MG TAKE ONE TABLET BY MOUTH TWICE DAILY 60 tablet 11  . fish oil-omega-3 fatty acids 1000 MG capsule Take 1 capsule (1 g total) by mouth daily. 30 capsule 2  . furosemide (LASIX) 40 MG tablet Take 1 tablet (40 mg total) by mouth daily. 90 tablet 3  . metFORMIN (GLUCOPHAGE) 500 MG tablet Take 1 tablet (500 mg total) by mouth every morning. 30 tablet 0  . Multiple Vitamin (MULTIVITAMIN) tablet Take 1 tablet by mouth daily.      . simvastatin (ZOCOR) 40 MG tablet Take 1 tablet (40 mg total) by mouth every evening. 90 tablet 3  . spironolactone (ALDACTONE) 25 MG tablet Take 1 tablet (25 mg total) by mouth daily. 90 tablet 3  . Vitamin D, Ergocalciferol, (DRISDOL) 50000 units CAPS capsule Take 1 capsule (50,000 Units total) by mouth every 7 (seven) days. 4 capsule 0   No current facility-administered medications for this  visit.     Physical Exam: Vitals:   01/24/17 0909  BP: 140/80  Pulse: 85  SpO2: 97%  Weight: (!) 335 lb (152 kg)  Height: 6\' 1"  (1.854 m)    GEN- The patient is overweight appearing, alert and oriented x 3 today.   Head- normocephalic, atraumatic Eyes-  Sclera clear, conjunctiva pink Ears- hearing intact Oropharynx- clear Lungs- Clear to ausculation bilaterally, normal work of breathing Chest- ICD pocket is well healed Heart- Regular rate and rhythm, no murmurs, rubs or gallops, PMI not laterally displaced GI- soft, NT, ND, + BS Extremities- no clubbing, cyanosis, or edema  ICD interrogation- reviewed in detail  today,  See PACEART report  ekg 11/22/16 reveals sinus rhythm with BiV pacing  Assessment and Plan:  1.  Chronic systolic dysfunction euvolemic today Stable on an appropriate medical regimen Normal ICD function See Pace Art report No changes today Followed in ICM device clinic  2. Morbid obesity Body mass index is 44.2 kg/m. I am very pleased with weight loss efforts!  3. Hypertensive cardiovascular disease Elevated this am, however he has not yet taken his am medicines  Merlin Return to see EP NP in 1 year Follow--up with Dr [name] as scheduled  [name] MD, [facility] 01/24/2017 9:29 AM

## 2017-02-01 ENCOUNTER — Ambulatory Visit (HOSPITAL_COMMUNITY)
Admission: RE | Admit: 2017-02-01 | Discharge: 2017-02-01 | Disposition: A | Discharge status: Home / Self Care | Payer: BLUE CROSS/BLUE SHIELD | Source: Ambulatory Visit | Attending: Cardiology | Admitting: Cardiology

## 2017-02-01 ENCOUNTER — Encounter (HOSPITAL_COMMUNITY): Payer: Self-pay | Admitting: Cardiology

## 2017-02-01 VITALS — BP 129/71 | HR 70 | Wt 329.8 lb

## 2017-02-01 DIAGNOSIS — I429 Cardiomyopathy, unspecified: Secondary | ICD-10-CM | POA: Diagnosis present

## 2017-02-01 DIAGNOSIS — E785 Hyperlipidemia, unspecified: Secondary | ICD-10-CM | POA: Insufficient documentation

## 2017-02-01 DIAGNOSIS — I5022 Chronic systolic (congestive) heart failure: Secondary | ICD-10-CM

## 2017-02-01 DIAGNOSIS — E669 Obesity, unspecified: Secondary | ICD-10-CM | POA: Insufficient documentation

## 2017-02-01 DIAGNOSIS — I509 Heart failure, unspecified: Secondary | ICD-10-CM | POA: Diagnosis not present

## 2017-02-01 DIAGNOSIS — Z7982 Long term (current) use of aspirin: Secondary | ICD-10-CM | POA: Insufficient documentation

## 2017-02-01 DIAGNOSIS — I110 Hypertensive heart disease with heart failure: Secondary | ICD-10-CM | POA: Diagnosis not present

## 2017-02-01 DIAGNOSIS — I447 Left bundle-branch block, unspecified: Secondary | ICD-10-CM | POA: Insufficient documentation

## 2017-02-01 DIAGNOSIS — Z7984 Long term (current) use of oral hypoglycemic drugs: Secondary | ICD-10-CM | POA: Insufficient documentation

## 2017-02-01 LAB — CUP PACEART REMOTE DEVICE CHECK
Battery Voltage: 2.93 V
Brady Statistic AP VS Percent: 1 %
HIGH POWER IMPEDANCE MEASURED VALUE: 61 Ohm
HighPow Impedance: 61 Ohm
Implantable Lead Implant Date: 20150421
Implantable Lead Implant Date: 20150421
Implantable Lead Location: 753857
Implantable Lead Location: 753860
Implantable Pulse Generator Implant Date: 20150421
Lead Channel Impedance Value: 460 Ohm
Lead Channel Pacing Threshold Amplitude: 0.625 V
Lead Channel Pacing Threshold Pulse Width: 0.5 ms
Lead Channel Sensing Intrinsic Amplitude: 4.2 mV
Lead Channel Sensing Intrinsic Amplitude: 8.7 mV
Lead Channel Setting Pacing Amplitude: 2 V
MDC IDC LEAD IMPLANT DT: 20150421
MDC IDC LEAD LOCATION: 753859
MDC IDC MSMT BATTERY REMAINING LONGEVITY: 47 mo
MDC IDC MSMT BATTERY REMAINING PERCENTAGE: 53 %
MDC IDC MSMT LEADCHNL LV IMPEDANCE VALUE: 850 Ohm
MDC IDC MSMT LEADCHNL LV PACING THRESHOLD AMPLITUDE: 0.75 V
MDC IDC MSMT LEADCHNL RA IMPEDANCE VALUE: 400 Ohm
MDC IDC MSMT LEADCHNL RA PACING THRESHOLD PULSEWIDTH: 0.5 ms
MDC IDC MSMT LEADCHNL RV PACING THRESHOLD AMPLITUDE: 0.75 V
MDC IDC MSMT LEADCHNL RV PACING THRESHOLD PULSEWIDTH: 0.5 ms
MDC IDC PG SERIAL: 7157119
MDC IDC SESS DTM: 20180906060015
MDC IDC SET LEADCHNL LV PACING AMPLITUDE: 2 V
MDC IDC SET LEADCHNL LV PACING PULSEWIDTH: 0.5 ms
MDC IDC SET LEADCHNL RA PACING AMPLITUDE: 2 V
MDC IDC SET LEADCHNL RV PACING PULSEWIDTH: 0.5 ms
MDC IDC SET LEADCHNL RV SENSING SENSITIVITY: 0.5 mV
MDC IDC STAT BRADY AP VP PERCENT: 3.4 %
MDC IDC STAT BRADY AS VP PERCENT: 94 %
MDC IDC STAT BRADY AS VS PERCENT: 1.9 %
MDC IDC STAT BRADY RA PERCENT PACED: 3.1 %

## 2017-02-01 LAB — BASIC METABOLIC PANEL
ANION GAP: 8 (ref 5–15)
BUN: 14 mg/dL (ref 6–20)
CO2: 25 mmol/L (ref 22–32)
Calcium: 8.9 mg/dL (ref 8.9–10.3)
Chloride: 104 mmol/L (ref 101–111)
Creatinine, Ser: 1.1 mg/dL (ref 0.61–1.24)
Glucose, Bld: 104 mg/dL — ABNORMAL HIGH (ref 65–99)
POTASSIUM: 4.1 mmol/L (ref 3.5–5.1)
SODIUM: 137 mmol/L (ref 135–145)

## 2017-02-01 NOTE — [place] Notes (Signed)
Patient ID: Gary Williamson, male   DOB: 1965-12-21, 51 y.o.   MRN: [id number] PCP: Reginia Forts Cardiology: Dr. [name]  51 y.o. with a h/o nonischemic cardiomyopathy, LBBB, HTN, and NYHA Class II CHF presents for followup of CHF.  Echo in 12/14 showed that his EF remains 20-25%.  He had a St Jude CRT-D system placed in 4/15.  Last echo in 12/15 showed EF 25-30%, mild LV dilation, normal RV size and systolic function.   He has lost 20 lbs recently in the [facility] weight loss program.  Feels good overall, no exertional dyspnea or chest pain. No orthopnea/PND.  No lightheadedness/syncope.   Corevue reviewed: Stable thoracic impedance, 99% BiV-paced    Labs (8/11): K 4.5, creatinine 1.3, LDL 79, HDL 26 Labs (2/12): K 4.3, creatinine 1.5 Labs (7/12): K 4.5, creatinine 1.2 Labs (12/14): K 4.4, creatinine 1.2, LDL 87, HDL 32 Labs (4/15): K 4.2, creatinine 1.03 Labs (03/25/14) : K 4.9 Creatinine 0.9 Cholesterol 161 TGL 104 HDL 31 LDL 109  Labs (4/16): K 4.4, creatinine 1.12, HCT 42.2, LDL 101, HDL 30 Labs (7/18): TSH mildly elevated, LDL 106, K 4.1, creatinine 0.98  Allergies (verified):  No Known Drug Allergies  Past Medical History: 1. Cardiomyopathy: Nonischemic.  He had a left heart cath in 2002 or 2003 with no significant coronary disease per his report (this was done in [place]). It sounds like his CMP has been present since about 2000.  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  Echo (5/11): severe LV dilation with EF < 20%, diffuse hypokinesis, moderate diastolic dysfunction, severe left atrial enlargement.  First hospitalization for CHF was in 5/11.  No drug use.  Adenosine myoview (6/11): EF 21%, diffuse hypokinesis, scar in the anterior and inferior walls and at the apex.  Mild peri-infarct ischemia.  LHC was done (7/11) showing EF 15%, global hypokinesis, LVEDP 22 mmHg, no angiographic CAD.  Echo (10/11): EF 20-25%, severely dilated LV with diffuse  hypokinesis, mild MR.  Echo (12/14) with EF 20-25%, moderately dilated LV, diffuse hypokinesis, cannot rule out apical thrombus.  He was unable to tolerate cardiac MRI.  He had St Jude CRT-D implantation in 4/15. Echo (12/15) with EF 25-30%, mild LV dilation, mild LVH, normal RV size and systolic function.  2. Hypertension 3. Chronic left bundle branch block 4. Hyperlipidemia  Family History: Family history is negative for premature diagnosis of coronary artery disease or any arrhythmia or history of heart failure in first-degree relatives.  Mother and father do have a history of hypertension  Social History: The patient lives in [place] with his wife and 2 children.   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   Current Outpatient Prescriptions  Medication Sig Dispense Refill  . aspirin 81 MG EC tablet Take 1 tablet (81 mg total) by mouth daily. 30 tablet 2  . carvedilol (COREG) 25 MG tablet TAKE 1 BY MOUTH TWICE DAILY 180 tablet 3  . Cholecalciferol (VITAMIN D) 2000 units CAPS Take by mouth daily.    . clobetasol ointment (TEMOVATE) 4.16 % Apply 1 application topically 2 (two) times daily. 30 g 0  . ENTRESTO 97-103 MG TAKE ONE TABLET BY MOUTH TWICE DAILY 60 tablet  11  . fish oil-omega-3 fatty acids 1000 MG capsule Take 1 capsule (1 g total) by mouth daily. 30 capsule 2  . furosemide (LASIX) 40 MG tablet Take 1 tablet (40 mg total) by mouth daily. 90 tablet 3  . metFORMIN (GLUCOPHAGE) 500 MG tablet Take 1 tablet (500 mg total) by mouth every morning. 30 tablet 0  . Multiple Vitamin (MULTIVITAMIN) tablet Take 1 tablet by mouth daily.      . simvastatin (ZOCOR) 40 MG tablet Take 1 tablet (40 mg total) by mouth every evening. 90 tablet 3  . spironolactone (ALDACTONE) 25  MG tablet Take 1 tablet (25 mg total) by mouth daily. 90 tablet 3  . Vitamin D, Ergocalciferol, (DRISDOL) 50000 units CAPS capsule Take 1 capsule (50,000 Units total) by mouth every 7 (seven) days. 4 capsule 0   No current facility-administered medications for this encounter.     BP 129/71   Pulse 70   Wt (!) 329 lb 12 oz (149.6 kg)   SpO2 100%   BMI 43.51 kg/m  General: NAD Neck: No JVD, no thyromegaly or thyroid nodule.  Lungs: Clear to auscultation bilaterally with normal respiratory effort. CV: Nondisplaced PMI.  Heart regular S1/S2, no S3/S4, no murmur.  No peripheral edema.  No carotid bruit.  Normal pedal pulses.  Abdomen: Soft, nontender, no hepatosplenomegaly, no distention.  Skin: Intact without lesions or rashes.  Neurologic: Alert and oriented x 3.  Psych: Normal affect. Extremities: No clubbing or cyanosis.  HEENT: Normal.   Assessment/Plan: 1. Nonischemic cardiomyopathy: Doing well. NYHA class I-II symptoms, stable.  He now has a Research officer, political party CRT-D system. Euvolemic on exam.  05/01/15 ECHO EF 25-30%. Unable to tolerate Bidil due to headaches.  - Continue lasix 40 mg daily.  BMET today.  - On goal dose of coreg 25 mg twice a day, Entresto 97/103 bid, and spironolactone 25 mg daily.  - I will arrange for echo to reassess LV function.  If EF has not improved, will try him on very low dose Bidil to see if he can tolerate. 2. Obesity: He is working on weight loss.    [name] 02/01/2017

## 2017-02-01 NOTE — Patient Instructions (Signed)
Labs drawn today (if we do not call you, then your lab work was stable)   Your physician has requested that you have an echocardiogram. Echocardiography is a painless test that uses sound waves to create images of your heart. It provides your doctor with information about the size and shape of your heart and how well your heart's chambers and valves are working. This procedure takes approximately one hour. There are no restrictions for this procedure.  Your physician recommends that you schedule a follow-up appointment in: 6 months

## 2017-02-09 ENCOUNTER — Ambulatory Visit (INDEPENDENT_AMBULATORY_CARE_PROVIDER_SITE_OTHER): Payer: BLUE CROSS/BLUE SHIELD | Admitting: Physician Assistant

## 2017-02-14 ENCOUNTER — Ambulatory Visit (INDEPENDENT_AMBULATORY_CARE_PROVIDER_SITE_OTHER): Payer: BLUE CROSS/BLUE SHIELD | Admitting: Physician Assistant

## 2017-02-14 VITALS — BP 128/79 | HR 65 | Temp 97.9°F | Ht 72.0 in | Wt 325.0 lb

## 2017-02-14 DIAGNOSIS — Z6841 Body Mass Index (BMI) 40.0 and over, adult: Secondary | ICD-10-CM | POA: Diagnosis not present

## 2017-02-14 DIAGNOSIS — R7303 Prediabetes: Secondary | ICD-10-CM | POA: Diagnosis not present

## 2017-02-14 DIAGNOSIS — E559 Vitamin D deficiency, unspecified: Secondary | ICD-10-CM | POA: Diagnosis not present

## 2017-02-14 MED ORDER — METFORMIN HCL 500 MG PO TABS: 500.0000 mg | ORAL_TABLET | Freq: Every morning | ORAL | 0 refills | Status: DC

## 2017-02-14 NOTE — [place] Notes (Signed)
Office: [phone]  /  Fax: [phone]   HPI:   Chief Complaint: Gary Williamson is here to discuss his progress with his obesity treatment plan. He is on the Category 3 plan and is following his eating plan approximately 50 % of the time. He states he is exercising walking 30 minutes 2 times per week. Gary Williamson continues to do well with weight loss. He plans his meals well despite all the traveling he does for work. He would like more options for dinner.  His weight is (!) 325 lb (147.4 kg) today and has had a weight loss of 3 pounds over a period of 3 to 4 weeks since his last visit. He has lost 15 lbs since starting treatment with Gary Williamson.  Gary Williamson has a diagnosis of pre-diabetes based on his elevated Hgb A1c and was informed this puts him at greater risk of developing diabetes. He is taking metformin currently and continues to work on diet and exercise to decrease risk of diabetes. He denies nausea, polyphagia, or hypoglycemia.  Vitamin D deficiency Gary Williamson has a diagnosis of vitamin D deficiency. He is currently taking vit D and denies nausea, vomiting or muscle weakness.  ALLERGIES: No Known Allergies  MEDICATIONS: Current Outpatient Prescriptions on File Prior to Visit  Medication Sig Dispense Refill  . aspirin 81 MG EC tablet Take 1 tablet (81 mg total) by mouth daily. 30 tablet 2  . carvedilol (COREG) 25 MG tablet TAKE 1 BY MOUTH TWICE DAILY 180 tablet 3  . Cholecalciferol (VITAMIN D) 2000 units CAPS Take by mouth daily.    . clobetasol ointment (TEMOVATE) 9.48 % Apply 1 application topically 2 (two) times daily. 30 g 0  . ENTRESTO 97-103 MG TAKE ONE TABLET BY MOUTH TWICE DAILY 60 tablet 11  . fish oil-omega-3 fatty acids 1000 MG capsule Take 1 capsule (1 g total) by mouth daily. 30 capsule 2  . furosemide (LASIX) 40 MG tablet Take 1 tablet (40 mg total) by mouth daily. 90 tablet 3  . Multiple Vitamin (MULTIVITAMIN) tablet Take 1 tablet by mouth daily.      .  simvastatin (ZOCOR) 40 MG tablet Take 1 tablet (40 mg total) by mouth every evening. 90 tablet 3  . spironolactone (ALDACTONE) 25 MG tablet Take 1 tablet (25 mg total) by mouth daily. 90 tablet 3  . Vitamin D, Ergocalciferol, (DRISDOL) 50000 units CAPS capsule Take 1 capsule (50,000 Units total) by mouth every 7 (seven) days. 4 capsule 0   No current facility-administered medications on file prior to visit.     PAST MEDICAL HISTORY: Past Medical History:  Diagnosis Date  . Acute idiopathic pericarditis 03/24/2010   Overview:  Viral Idiopathic Pericarditis   . Chronic systolic heart failure (Gary Williamson)   . Hypertension   . LBBB (left bundle branch block)    Chronic  . Leg edema   . NICM (nonischemic cardiomyopathy) (Gary Williamson)    Probably nonischemic; LHC in 2002/03 no sig CAD per report (done in [place]). Cardiomyopathy for 9-10 yrs. 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 Echo 5/11: severe LVE, EF <20%, diff HK, mod dias dys, sev. LAE. Hosp for CHF in 5/11. Ad. MV 6/11: EF 21%, mild per-infart ischemia. LHC (7/11): EF 15%, no CAD, LVEDP 22; Echo (10/11): EF 20-25%;  Echo (12/12):  EF 20%, mod LVH, Gr 1 DD, diff HK, mild LAE  . OSA (obstructive sleep apnea)    cpap used  . Pure hypercholesterolemia   . Severe obesity (Gary Williamson)   .  Vitamin D deficiency     PAST SURGICAL HISTORY: Past Surgical History:  Procedure Laterality Date  . BI-VENTRICULAR IMPLANTABLE CARDIOVERTER DEFIBRILLATOR N/A 08/21/2013   Procedure: BI-VENTRICULAR IMPLANTABLE CARDIOVERTER DEFIBRILLATOR  (CRT-D);  Surgeon: Coralyn Mark, MD;  Location: [facility] CATH LAB;  Service: Cardiovascular;  Laterality: N/A;  . CARDIAC CATHETERIZATION  2002 or 2003  . COLONOSCOPY N/A 09/16/2014   Procedure: COLONOSCOPY;  Surgeon: [name], MD;  Location: [facility] ENDOSCOPY;  Service: Endoscopy;  Laterality: N/A;  . LEFT HEART CATH  11/13/09   Dr [name]    SOCIAL HISTORY: Social History  Substance Use Topics  . Smoking status: Never Smoker  . Smokeless  tobacco: Never Used  . Alcohol use 0.0 - 1.0 oz/week     Comment: rare to occ.    FAMILY HISTORY: Family History  Problem Relation Age of Onset  . Hypertension Mother   . Alzheimer's disease Mother   . Hyperlipidemia Mother   . Breast cancer Mother   . Cancer Mother        breast cancer  . Stroke Maternal Grandmother   . Arthritis Father   . Dementia Father   . Hypertension Father   . Alzheimer's disease Father   . COPD Brother   . Coronary artery disease Neg Hx   . Heart failure Neg Hx   . Heart disease Neg Hx     ROS: Review of Systems  Constitutional: Positive for weight loss.  Gastrointestinal: Negative for nausea and vomiting.  Musculoskeletal:       Negative muscle weakness  Endo/Heme/Allergies:       Negative hypoglycemia Negative polyphagia    PHYSICAL EXAM: Blood pressure 128/79, pulse 65, temperature 97.9 F (36.6 C), temperature source Oral, height 6' (1.829 m), weight (!) 325 lb (147.4 kg), SpO2 99 %. Body mass index is 44.08 kg/m. Physical Exam  Constitutional: He is oriented to person, place, and time. He appears well-developed and well-nourished.  Cardiovascular: Normal rate.   Pulmonary/Chest: Effort normal.  Musculoskeletal: Normal range of motion.  Neurological: He is oriented to person, place, and time.  Skin: Skin is warm and dry.  Psychiatric: He has a normal mood and affect. His behavior is normal.  Vitals reviewed.   RECENT LABS AND TESTS: BMET    Component Value Date/Time   NA 137 02/01/2017 1003   NA 144 11/22/2016 0954   K 4.1 02/01/2017 1003   CL 104 02/01/2017 1003   CO2 25 02/01/2017 1003   GLUCOSE 104 (H) 02/01/2017 1003   BUN 14 02/01/2017 1003   BUN 12 11/22/2016 0954   CREATININE 1.10 02/01/2017 1003   CREATININE 1.06 02/24/2016 1003   CALCIUM 8.9 02/01/2017 1003   GFRNONAA >60 02/01/2017 1003   GFRAA >60 02/01/2017 1003   Lab Results  Component Value Date   HGBA1C 6.2 (H) 11/22/2016   HGBA1C 6.0 (H) 08/24/2016     HGBA1C 5.8 (H) 02/24/2016   HGBA1C 6.2 (H) 08/21/2015   HGBA1C 6.1 (H) 08/28/2014   Lab Results  Component Value Date   INSULIN 37.2 (H) 11/22/2016   CBC    Component Value Date/Time   WBC 8.4 11/22/2016 0954   WBC 9.7 11/11/2016 1038   WBC 7.9 02/24/2016 1003   RBC 4.53 11/22/2016 0954   RBC 4.80 11/11/2016 1038   RBC 4.46 02/24/2016 1003   HGB 13.4 11/22/2016 0954   HCT 39.8 11/22/2016 0954   PLT 220 08/24/2016 0953   MCV 88 11/22/2016 0954   MCH  29.6 11/22/2016 0954   MCH 29.3 11/11/2016 1038   MCH 30.0 02/24/2016 1003   MCHC 33.7 11/22/2016 0954   MCHC 33.0 11/11/2016 1038   MCHC 34.3 02/24/2016 1003   RDW 15.0 11/22/2016 0954   LYMPHSABS 1.9 11/22/2016 0954   MONOABS 711 02/24/2016 1003   EOSABS 0.3 11/22/2016 0954   BASOSABS 0.0 11/22/2016 0954   Iron/TIBC/Ferritin/ %Sat No results found for: IRON, TIBC, FERRITIN, IRONPCTSAT Lipid Panel     Component Value Date/Time   CHOL 165 11/22/2016 0954   TRIG 149 11/22/2016 0954   HDL 29 (L) 11/22/2016 0954   CHOLHDL 5.0 08/24/2016 0953   CHOLHDL 5.5 (H) 02/24/2016 1003   VLDL 24 02/24/2016 1003   LDLCALC 106 (H) 11/22/2016 0954   Hepatic Function Panel     Component Value Date/Time   PROT 6.5 11/22/2016 0954   ALBUMIN 3.8 11/22/2016 0954   AST 24 11/22/2016 0954   ALT 21 11/22/2016 0954   ALKPHOS 71 11/22/2016 0954   BILITOT 0.3 11/22/2016 0954   BILIDIR 0.1 12/22/2009 0846      Component Value Date/Time   TSH 4.690 (H) 11/22/2016 0954   TSH 3.360 08/24/2016 0953   TSH 2.68 08/21/2015 0902    ASSESSMENT AND PLAN: Prediabetes - Plan: metFORMIN (GLUCOPHAGE) 500 MG tablet  Vitamin D deficiency  Class 3 severe obesity with serious comorbidity and body mass index (BMI) of 40.0 to 44.9 in adult, unspecified obesity type ([place])  PLAN:  Pre-Diabetes Gary Williamson will continue to work on weight loss, exercise, and decreasing simple carbohydrates in his diet to help decrease the risk of diabetes. We dicussed  metformin including benefits and risks. He was informed that eating too many simple carbohydrates or too many calories at one sitting increases the likelihood of GI side effects. Gary Williamson agrees to continue taking metformin 500 mg #30 and we will refill for 1 month. Gary Williamson agreed to follow up with our clinic in 3 weeks as directed to monitor his progress.  Vitamin D Deficiency Gary Williamson was informed that low vitamin D levels contributes to fatigue and are associated with obesity, breast, and colon cancer. Gary Williamson agrees to continue taking prescription Vit D @50 ,000 IU every week #4 and will follow up for routine testing of vitamin D, at least 2-3 times per year. He was informed of the risk of over-replacement of vitamin D and agrees to not increase his dose unless he discusses this with Gary Williamson first. Gary Williamson agrees to follow up with our clinic in 3 weeks.  Obesity Gary Williamson is currently in the action stage of change. As such, his goal is to continue with weight loss efforts He has agreed to change to keep a food journal with 600 calories and 40 grams of protein at supper and follow the Category 3 Gary Williamson has been instructed to work up to a goal of 150 minutes of combined cardio and strengthening exercise per week for weight loss and overall health benefits. We discussed the following Behavioral Modification Strategies today: increasing lean protein intake and work on meal planning and easy cooking plans    Algis has agreed to follow up with our clinic in 3 weeks. He was informed of the importance of frequent follow up visits to maximize his success with intensive lifestyle modifications for his multiple health conditions.  I, Gary Williamson, am acting as transcriptionist for Lacy Duverney, PA-C  I have reviewed the above documentation for accuracy and completeness, and I agree with the above. -Lacy Duverney, PA-C  I have reviewed the above note and agree with the plan. -Dennard Nip,  MD     Today's visit was # 6 out of 22.  Starting weight: 340 lbs Starting date: 11/22/16 Today's weight: 325 lbs Today's date: 02/14/2017 Total lbs lost to date: 15 (Patients must lose 7 lbs in the first 6 months to continue with counseling)   ASK: We discussed the diagnosis of obesity with [facility] today and Keyante agreed to give Gary Williamson permission to discuss obesity behavioral modification therapy today.  ASSESS: Daeshawn has the diagnosis of obesity and his BMI today is 62 Berlie is in the action stage of change   ADVISE: Joshuwa was educated on the multiple health risks of obesity as well as the benefit of weight loss to improve his health. He was advised of the need for long term treatment and the importance of lifestyle modifications.  AGREE: Multiple dietary modification options and treatment options were discussed and  Foday agreed to keep a food journal with 600 calories and 40 grams of protein at supper and follow the Category 3 plan We discussed the following Behavioral Modification Strategies today: increasing lean protein intake and work on meal planning and easy cooking plans

## 2017-02-22 ENCOUNTER — Other Ambulatory Visit: Payer: Self-pay

## 2017-02-22 ENCOUNTER — Ambulatory Visit (INDEPENDENT_AMBULATORY_CARE_PROVIDER_SITE_OTHER): Payer: BLUE CROSS/BLUE SHIELD | Admitting: Physician Assistant

## 2017-02-22 ENCOUNTER — Encounter (INDEPENDENT_AMBULATORY_CARE_PROVIDER_SITE_OTHER): Payer: Self-pay

## 2017-02-24 ENCOUNTER — Ambulatory Visit (INDEPENDENT_AMBULATORY_CARE_PROVIDER_SITE_OTHER): Payer: BLUE CROSS/BLUE SHIELD

## 2017-02-24 ENCOUNTER — Telehealth: Payer: Self-pay | Admitting: Cardiology

## 2017-02-24 DIAGNOSIS — I5022 Chronic systolic (congestive) heart failure: Secondary | ICD-10-CM

## 2017-02-24 DIAGNOSIS — Z95810 Presence of automatic (implantable) cardiac defibrillator: Secondary | ICD-10-CM | POA: Diagnosis not present

## 2017-02-24 NOTE — Telephone Encounter (Signed)
Spoke with pt and reminded pt of remote transmission that is due today. Pt verbalized understanding.   

## 2017-02-25 ENCOUNTER — Telehealth: Payer: Self-pay

## 2017-02-25 NOTE — Telephone Encounter (Signed)
Remote ICM transmission received.  Attempted call to patient and left message to return call regarding transmission. 

## 2017-02-25 NOTE — Progress Notes (Signed)
EPIC Encounter for ICM Monitoring  Patient Name: Franciszek Platten is a 51 y.o. male Date: 02/25/2017 Primary Care Physican: Wardell Honour, MD Primary Cardiologist: [name] Electrophysiologist: [name] Dry Weight:unknown Bi-V Pacing: 98%      Attempted call to patient and unable to reach.  Left message to return call regarding transmission.  Transmission reviewed.    Thoracic impedance abnormal suggesting fluid accumulation.  Prescribed dosage: Furosemide 40 mg 1 tablet daily  Labs: 02/01/2017 Creatinine 1.10, BUN 14, Potassium 4.1, Sodium 137 11/22/2016 Creatinine 0.98, BUN 12, Potassium 4.1, Sodium 144, EGFR 88-102 08/24/2016 Creatinine 1.13, BUN 12, Potassium 4.4, Sodium 142  Recommendations: NONE - Unable to reach patient   Follow-up plan: ICM clinic phone appointment on 03/07/2017.    Copy of ICM check sent to Dr. [name] and Dr. [name].   3 month ICM trend: 02/25/2017   1 Year ICM trend:      [name], RN 02/25/2017 8:48 AM

## 2017-03-01 ENCOUNTER — Ambulatory Visit: Payer: BLUE CROSS/BLUE SHIELD | Admitting: Family Medicine

## 2017-03-02 NOTE — Progress Notes (Signed)
Increase Lasix to 40 mg bid x 3 days then back to 40 mg daily.  

## 2017-03-07 ENCOUNTER — Telehealth: Payer: Self-pay

## 2017-03-07 ENCOUNTER — Ambulatory Visit (INDEPENDENT_AMBULATORY_CARE_PROVIDER_SITE_OTHER): Payer: Self-pay

## 2017-03-07 DIAGNOSIS — Z95810 Presence of automatic (implantable) cardiac defibrillator: Secondary | ICD-10-CM

## 2017-03-07 DIAGNOSIS — I5022 Chronic systolic (congestive) heart failure: Secondary | ICD-10-CM

## 2017-03-07 NOTE — Telephone Encounter (Signed)
Remote ICM transmission received.  Attempted call to patient and left detailed message per patient's permission regarding transmission and next ICM scheduled for 04/07/2017.  Advised to return call for any fluid symptoms or questions.

## 2017-03-07 NOTE — Progress Notes (Signed)
EPIC Encounter for ICM Monitoring  Patient Name: Gary Williamson is a 51 y.o. male Date: 03/07/2017 Primary Care Physican: Wardell Honour, MD Primary Cardiologist: [name] Electrophysiologist: [name] Dry Weight:unknown Bi-V Pacing: 98%       Attempted call to patient and unable to reach.  Left detailed message regarding transmission.  Transmission reviewed.    Thoracic impedance returned to normal.  Prescribed dosage: Furosemide 40 mg 1 tablet daily  Labs: 02/01/2017 Creatinine 1.10, BUN 14, Potassium 4.1, Sodium 137 11/22/2016 Creatinine 0.98, BUN 12, Potassium 4.1, Sodium 144, EGFR 88-102 08/24/2016 Creatinine 1.13, BUN 12, Potassium 4.4, Sodium 142  Recommendations:  Left voice mail with ICM number and encouraged to call if experiencing any fluid symptoms.  Follow-up plan: ICM clinic phone appointment on 04/07/2017.    Copy of ICM check sent to Dr. [name].   3 month ICM trend: 03/07/2017   1 Year ICM trend:      [name], RN 03/07/2017 10:37 AM

## 2017-03-10 ENCOUNTER — Encounter (INDEPENDENT_AMBULATORY_CARE_PROVIDER_SITE_OTHER): Payer: Self-pay

## 2017-03-10 ENCOUNTER — Ambulatory Visit (INDEPENDENT_AMBULATORY_CARE_PROVIDER_SITE_OTHER): Payer: BLUE CROSS/BLUE SHIELD | Admitting: Physician Assistant

## 2017-04-07 ENCOUNTER — Encounter: Payer: BLUE CROSS/BLUE SHIELD | Admitting: *Deleted

## 2017-04-07 ENCOUNTER — Telehealth: Payer: Self-pay | Admitting: Cardiology

## 2017-04-07 NOTE — Telephone Encounter (Signed)
LMOVM reminding pt to send remote transmission.   

## 2017-04-08 ENCOUNTER — Encounter: Payer: Self-pay | Admitting: Cardiology

## 2017-04-08 NOTE — Progress Notes (Signed)
No ICM remote transmission received for 04/07/2017 and next ICM transmission scheduled for 05/13/2017.

## 2017-04-16 ENCOUNTER — Other Ambulatory Visit: Payer: Self-pay | Admitting: Family Medicine

## 2017-04-16 DIAGNOSIS — I5022 Chronic systolic (congestive) heart failure: Secondary | ICD-10-CM

## 2017-05-13 ENCOUNTER — Ambulatory Visit (INDEPENDENT_AMBULATORY_CARE_PROVIDER_SITE_OTHER): Payer: BLUE CROSS/BLUE SHIELD

## 2017-05-13 DIAGNOSIS — Z95810 Presence of automatic (implantable) cardiac defibrillator: Secondary | ICD-10-CM

## 2017-05-13 DIAGNOSIS — I5022 Chronic systolic (congestive) heart failure: Secondary | ICD-10-CM | POA: Diagnosis not present

## 2017-05-13 NOTE — Progress Notes (Signed)
EPIC Encounter for ICM Monitoring  Patient Name: Gary Williamson is a 52 y.o. male Date: 05/13/2017 Primary Care Physican: Wardell Honour, MD Primary Cardiologist: [name] Electrophysiologist: [name] Dry Weight:unknown Bi-V Pacing: 99%       Heart Failure questions reviewed, pt asymptomatic.   Thoracic impedance normal.  Prescribed dosage: Furosemide 40 mg 1 tablet daily  Labs: 02/01/2017 Creatinine 1.10, BUN 14, Potassium 4.1, Sodium 137 11/22/2016 Creatinine 0.98, BUN 12, Potassium 4.1, Sodium 144, EGFR 88-102 08/24/2016 Creatinine 1.13, BUN 12, Potassium 4.4, Sodium 142  Recommendations: No changes.  Encouraged to call for fluid symptoms.  Follow-up plan: ICM clinic phone appointment on 06/14/2017.    Copy of ICM check sent to Dr. [name].   3 month ICM trend: 05/13/2017    1 Year ICM trend:       [name], RN 05/13/2017 6:30 PM

## 2017-05-15 ENCOUNTER — Other Ambulatory Visit: Payer: Self-pay | Admitting: Family Medicine

## 2017-05-15 DIAGNOSIS — I5022 Chronic systolic (congestive) heart failure: Secondary | ICD-10-CM

## 2017-05-16 ENCOUNTER — Other Ambulatory Visit: Payer: Self-pay | Admitting: *Deleted

## 2017-05-16 MED ORDER — FUROSEMIDE 40 MG PO TABS: 40.0000 mg | ORAL_TABLET | Freq: Every day | ORAL | 0 refills | Status: DC

## 2017-05-16 MED ORDER — CARVEDILOL 25 MG PO TABS: ORAL_TABLET | ORAL | 0 refills | Status: DC

## 2017-05-16 MED ORDER — FUROSEMIDE 40 MG PO TABS: 40.0000 mg | ORAL_TABLET | Freq: Every day | ORAL | 1 refills | Status: DC

## 2017-05-16 MED ORDER — CARVEDILOL 25 MG PO TABS: ORAL_TABLET | ORAL | 1 refills | Status: DC

## 2017-05-16 NOTE — Telephone Encounter (Signed)
Requesting refill, patient has appointment 07/20/2017. Please advise.

## 2017-05-16 NOTE — Telephone Encounter (Signed)
Will forward to Dr. Zada Finders of cardiology who has seen the patient in September 2018.

## 2017-05-18 ENCOUNTER — Encounter: Payer: Self-pay | Admitting: Physician Assistant

## 2017-05-18 ENCOUNTER — Ambulatory Visit: Payer: BLUE CROSS/BLUE SHIELD | Admitting: Physician Assistant

## 2017-05-18 ENCOUNTER — Other Ambulatory Visit: Payer: Self-pay

## 2017-05-18 ENCOUNTER — Telehealth: Payer: Self-pay | Admitting: Physician Assistant

## 2017-05-18 VITALS — BP 128/78 | HR 75 | Temp 98.8°F | Resp 16 | Ht 73.43 in | Wt 338.0 lb

## 2017-05-18 DIAGNOSIS — J069 Acute upper respiratory infection, unspecified: Secondary | ICD-10-CM

## 2017-05-18 DIAGNOSIS — R05 Cough: Secondary | ICD-10-CM | POA: Diagnosis not present

## 2017-05-18 DIAGNOSIS — R059 Cough, unspecified: Secondary | ICD-10-CM

## 2017-05-18 MED ORDER — AZITHROMYCIN 250 MG PO TABS: ORAL_TABLET | ORAL | 0 refills | Status: DC

## 2017-05-18 MED ORDER — IPRATROPIUM BROMIDE 0.03 % NA SOLN
2.0000 | Freq: Two times a day (BID) | NASAL | 0 refills | Status: DC
Start: 2017-05-18 — End: 2017-11-23

## 2017-05-18 MED ORDER — HYDROCOD POLST-CPM POLST ER 10-8 MG/5ML PO SUER
5.0000 mL | Freq: Every evening | ORAL | 0 refills | Status: DC | PRN
Start: 2017-05-18 — End: 2017-07-15

## 2017-05-18 MED ORDER — GUAIFENESIN ER 1200 MG PO TB12: 1.0000 | ORAL_TABLET | Freq: Two times a day (BID) | ORAL | 1 refills | Status: DC | PRN

## 2017-05-18 MED ORDER — HYDROCOD POLST-CPM POLST ER 10-8 MG/5ML PO SUER: 5.0000 mL | Freq: Every evening | ORAL | 0 refills | Status: DC | PRN

## 2017-05-18 NOTE — Addendum Note (Signed)
Addended by: Ivar Drape D on: 05/18/2017 12:38 PM   Modules accepted: Orders

## 2017-05-18 NOTE — Telephone Encounter (Signed)
Copied from [place]. Topic: Quick Communication - See Telephone Encounter >> May 18, 2017 12:27 PM Ether Griffins B wrote: CRM for notification. See Telephone encounter for:  [facility] stating they cant give pt full dose of chlorpheniramine-HYDROcodone that was called in and suggested pt have it moved to [facility] on [address]. Pt going out of town and has to be at airport at 2  05/18/17.

## 2017-05-18 NOTE — Telephone Encounter (Signed)
Order changed. Pt made aware.

## 2017-05-18 NOTE — Progress Notes (Signed)
PRIMARY CARE AT [place], [place] [address] [phone]  Date:  05/18/2017   Name:  Gary Williamson   DOB:  March 12, 1966   MRN:  [id number]  PCP:  Wardell Honour, MD    History of Present Illness:  Gary Williamson is a 52 y.o. male patient who presents to PCP with  Chief Complaint  Patient presents with  . Cough    with some chest congestion x 3 days   . Nasal Congestion     3 days ago, his symptoms started coughing productive at night only.  Runny nose.  No post nasal drip.  No ear discomfort.  No sob or dyspnea.  A dry hacking cough.  Yellowish sputum came up last night.  No fever or chills.   He has taken vitamin C liquid.   No known sick contacts.   Patient Active Problem List   Diagnosis Date Noted  . Class 3 obesity with serious comorbidity and body mass index (BMI) of 40.0 to 44.9 in adult 12/21/2016  . Class 3 obesity with serious comorbidity and body mass index (BMI) of 45.0 to 49.9 in adult 11/22/2016  . Essential hypertension 11/22/2016  . Benign neoplasm of cecum   . Benign neoplasm of sigmoid colon   . Hematuria, microscopic 05/15/2013  . NICM (nonischemic cardiomyopathy) ([place]) 04/09/2013  . OSA (obstructive sleep apnea) 01/18/2013  . Hypertension   . CARDIOVASCULAR FUNCTION STUDY, ABNORMAL 11/04/2009  . Chronic systolic heart failure ([place]) 10/14/2009  . Vitamin D deficiency 05/02/2009  . HLD (hyperlipidemia) 03/05/2009    Past Medical History:  Diagnosis Date  . Acute idiopathic pericarditis 03/24/2010   Overview:  Viral Idiopathic Pericarditis   . Chronic systolic heart failure ([place])   . Hypertension   . LBBB (left bundle branch block)    Chronic  . Leg edema   . NICM (nonischemic cardiomyopathy) ([place])    Probably nonischemic; LHC in 2002/03 no sig CAD per report (done in [place]). Cardiomyopathy for 9-10 yrs. 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 Echo 5/11: severe LVE, EF <20%, diff HK, mod dias dys, sev. LAE. Hosp for CHF in 5/11. Ad. MV  6/11: EF 21%, mild per-infart ischemia. LHC (7/11): EF 15%, no CAD, LVEDP 22; Echo (10/11): EF 20-25%;  Echo (12/12):  EF 20%, mod LVH, Gr 1 DD, diff HK, mild LAE  . OSA (obstructive sleep apnea)    cpap used  . Pure hypercholesterolemia   . Severe obesity ([place])   . Vitamin D deficiency     Past Surgical History:  Procedure Laterality Date  . BI-VENTRICULAR IMPLANTABLE CARDIOVERTER DEFIBRILLATOR N/A 08/21/2013   Procedure: BI-VENTRICULAR IMPLANTABLE CARDIOVERTER DEFIBRILLATOR  (CRT-D);  Surgeon: Coralyn Mark, MD;  Location: [facility] CATH LAB;  Service: Cardiovascular;  Laterality: N/A;  . CARDIAC CATHETERIZATION  2002 or 2003  . COLONOSCOPY N/A 09/16/2014   Procedure: COLONOSCOPY;  Surgeon: [name], MD;  Location: [facility] ENDOSCOPY;  Service: Endoscopy;  Laterality: N/A;  . LEFT HEART CATH  11/13/09   Dr [name]    Social History   Tobacco Use  . Smoking status: Never Smoker  . Smokeless tobacco: Never Used  Substance Use Topics  . Alcohol use: Yes    Alcohol/week: 0.0 - 1.0 oz    Comment: rare to occ.  . Drug use: No    Family History  Problem Relation Age of Onset  . Hypertension Mother   . Alzheimer's disease Mother   . Hyperlipidemia Mother   .  Breast cancer Mother   . Cancer Mother        breast cancer  . Stroke Maternal Grandmother   . Arthritis Father   . Dementia Father   . Hypertension Father   . Alzheimer's disease Father   . COPD Brother   . Coronary artery disease Neg Hx   . Heart failure Neg Hx   . Heart disease Neg Hx     No Known Allergies  Medication list has been reviewed and updated.  Current Outpatient Medications on File Prior to Visit  Medication Sig Dispense Refill  . aspirin 81 MG EC tablet Take 1 tablet (81 mg total) by mouth daily. 30 tablet 2  . carvedilol (COREG) 25 MG tablet TAKE 1 TABLET BY MOUTH TWICE A DAY 180 tablet 0  . Cholecalciferol (VITAMIN D) 2000 units CAPS Take by mouth daily.    Marland Kitchen ENTRESTO 97-103 MG TAKE ONE TABLET BY MOUTH  TWICE DAILY 60 tablet 11  . fish oil-omega-3 fatty acids 1000 MG capsule Take 1 capsule (1 g total) by mouth daily. 30 capsule 2  . furosemide (LASIX) 40 MG tablet Take 1 tablet (40 mg total) by mouth daily. Office visit needed 90 tablet 0  . Multiple Vitamin (MULTIVITAMIN) tablet Take 1 tablet by mouth daily.      . simvastatin (ZOCOR) 40 MG tablet Take 1 tablet (40 mg total) by mouth every evening. 90 tablet 3  . spironolactone (ALDACTONE) 25 MG tablet TAKE 1 TABLET BY MOUTH DAILY. OFFICE VISIT NEEDED 30 tablet 0  . Vitamin D, Ergocalciferol, (DRISDOL) 50000 units CAPS capsule Take 1 capsule (50,000 Units total) by mouth every 7 (seven) days. 4 capsule 0   No current facility-administered medications on file prior to visit.     ROS ROS otherwise unremarkable unless listed above.  Physical Examination: BP 128/78   Pulse 75   Temp 98.8 F (37.1 C) (Oral)   Resp 16   Ht 6' 1.43" (1.865 m)   Wt (!) 338 lb (153.3 kg)   SpO2 98%   BMI 44.08 kg/m  Ideal Body Weight: Weight in (lb) to have BMI = 25: 191.3  Physical Exam  Constitutional: He is oriented to person, place, and time. He appears well-developed and well-nourished. No distress.  HENT:  Head: Normocephalic and atraumatic.  Right Ear: Tympanic membrane, external ear and ear canal normal.  Left Ear: Tympanic membrane, external ear and ear canal normal.  Nose: Mucosal edema and rhinorrhea present. Right sinus exhibits no maxillary sinus tenderness and no frontal sinus tenderness. Left sinus exhibits no maxillary sinus tenderness and no frontal sinus tenderness.  Mouth/Throat: No uvula swelling. No oropharyngeal exudate, posterior oropharyngeal edema or posterior oropharyngeal erythema.  Eyes: Conjunctivae, EOM and lids are normal. Pupils are equal, round, and reactive to light. Right eye exhibits normal extraocular motion. Left eye exhibits normal extraocular motion.  Neck: Trachea normal and full passive range of motion without  pain. No edema and no erythema present.  Cardiovascular: Normal rate.  Pulmonary/Chest: Effort normal. No respiratory distress. He has no decreased breath sounds. He has no wheezes. He has no rhonchi.  Neurological: He is alert and oriented to person, place, and time.  Skin: Skin is warm and dry. He is not diaphoretic.  Psychiatric: He has a normal mood and affect. His behavior is normal.     Assessment and Plan: Tomislav Micale is a 52 y.o. male who is here today for cc of  Chief Complaint  Patient presents with  .  Cough    with some chest congestion x 3 days   . Nasal Congestion  --he will be travelling at this time.  Advised to treat supportively Given strict instruction of abx use if symptoms should arise as we discussed, ane he will take to completion.  He has taken this abx before without issue Upper respiratory infection, viral - Plan: Guaifenesin (MUCINEX MAXIMUM STRENGTH) 1200 MG TB12, ipratropium (ATROVENT) 0.03 % nasal spray, chlorpheniramine-HYDROcodone (TUSSIONEX PENNKINETIC ER) 10-8 MG/5ML SUER, azithromycin (ZITHROMAX) 250 MG tablet  Ivar Drape, PA-C [facility] 1/16/201912:24 PM

## 2017-05-18 NOTE — Patient Instructions (Addendum)
Upper Respiratory Infection, Adult Most upper respiratory infections (URIs) are caused by a virus. A URI affects the nose, throat, and upper air passages. The most common type of URI is often called "the common cold." Follow these instructions at home:  Take medicines only as told by your doctor.  Gargle warm saltwater or take cough drops to comfort your throat as told by your doctor.  Use a warm mist humidifier or inhale steam from a shower to increase air moisture. This may make it easier to breathe.  Drink enough fluid to keep your pee (urine) clear or pale yellow.  Eat soups and other clear broths.  Have a healthy diet.  Rest as needed.  Go back to work when your fever is gone or your doctor says it is okay. ? You may need to stay home longer to avoid giving your URI to others. ? You can also wear a face mask and wash your hands often to prevent spread of the virus.  Use your inhaler more if you have asthma.  Do not use any tobacco products, including cigarettes, chewing tobacco, or electronic cigarettes. If you need help quitting, ask your doctor. Contact a doctor if:  You are getting worse, not better.  Your symptoms are not helped by medicine.  You have chills.  You are getting more short of breath.  You have brown or red mucus.  You have yellow or brown discharge from your nose.  You have pain in your face, especially when you bend forward.  You have a fever.  You have puffy (swollen) neck glands.  You have pain while swallowing.  You have white areas in the back of your throat. Get help right away if:  You have very bad or constant: ? Headache. ? Ear pain. ? Pain in your forehead, behind your eyes, and over your cheekbones (sinus pain). ? Chest pain.  You have long-lasting (chronic) lung disease and any of the following: ? Wheezing. ? Long-lasting cough. ? Coughing up blood. ? A change in your usual mucus.  You have a stiff neck.  You have  changes in your: ? Vision. ? Hearing. ? Thinking. ? Mood. This information is not intended to replace advice given to you by your health care provider. Make sure you discuss any questions you have with your health care provider. Document Released: 10/06/2007 Document Revised: 12/21/2015 Document Reviewed: 07/25/2013 Elsevier Interactive Patient Education  2018 [employer].     IF you received an x-ray today, you will receive an invoice from [facility]. Please contact [facility] at [phone] with questions or concerns regarding your invoice.   IF you received labwork today, you will receive an invoice from [facility]. Please contact [facility] at [phone] with questions or concerns regarding your invoice.   Our billing staff will not be able to assist you with questions regarding bills from these companies.  You will be contacted with the lab results as soon as they are available. The fastest way to get your results is to activate your My Chart account. Instructions are located on the last page of this paperwork. If you have not heard from [name] regarding the results in 2 weeks, please contact this office.

## 2017-05-23 ENCOUNTER — Telehealth: Payer: Self-pay | Admitting: Family Medicine

## 2017-05-23 NOTE — Telephone Encounter (Signed)
Incoming call transferred from patient call center. Spoke with April at [facility]. She is calling for amount of tablet clarification for furosemide 40mg  and carvedilol 25mg . Furosemide 40mg  was for #90 tabs, carvedilol was for #180 tabs.  April appreciative, no further questions.

## 2017-06-10 ENCOUNTER — Other Ambulatory Visit (HOSPITAL_COMMUNITY): Payer: Self-pay | Admitting: *Deleted

## 2017-06-10 DIAGNOSIS — I5022 Chronic systolic (congestive) heart failure: Secondary | ICD-10-CM

## 2017-06-10 MED ORDER — SPIRONOLACTONE 25 MG PO TABS: ORAL_TABLET | ORAL | 3 refills | Status: DC

## 2017-06-18 ENCOUNTER — Other Ambulatory Visit (HOSPITAL_COMMUNITY): Payer: Self-pay | Admitting: Cardiology

## 2017-06-18 DIAGNOSIS — I428 Other cardiomyopathies: Secondary | ICD-10-CM

## 2017-06-28 NOTE — Progress Notes (Signed)
No ICM remote transmission received for 06/14/2017 and next ICM transmission scheduled for 07/07/2017.

## 2017-07-07 ENCOUNTER — Ambulatory Visit (INDEPENDENT_AMBULATORY_CARE_PROVIDER_SITE_OTHER): Payer: BLUE CROSS/BLUE SHIELD

## 2017-07-07 ENCOUNTER — Telehealth: Payer: Self-pay

## 2017-07-07 DIAGNOSIS — Z95810 Presence of automatic (implantable) cardiac defibrillator: Secondary | ICD-10-CM | POA: Diagnosis not present

## 2017-07-07 DIAGNOSIS — I5022 Chronic systolic (congestive) heart failure: Secondary | ICD-10-CM

## 2017-07-07 NOTE — Telephone Encounter (Signed)
LMOVM requesting that pt send manual transmission 

## 2017-07-08 ENCOUNTER — Telehealth: Payer: Self-pay

## 2017-07-08 NOTE — Progress Notes (Signed)
EPIC Encounter for ICM Monitoring  Patient Name: Gary Williamson is a 52 y.o. male Date: 07/08/2017 Primary Care Physican: Wardell Honour, MD Primary Cardiologist: [name] Electrophysiologist: [name] Dry Weight:unknown Bi-V Pacing: 99%      Attempted call to patient and unable to reach.  Left detailed message regarding transmission.  Transmission reviewed.    Thoracic impedance normal but has a pattern of accumulating fluid for a few days alternating with dryness for a few days.  Prescribed dosage: Furosemide 40 mg 1 tablet daily  Labs: 02/01/2017 Creatinine 1.10, BUN 14, Potassium 4.1, Sodium 137 11/22/2016 Creatinine 0.98, BUN 12, Potassium 4.1, Sodium 144, EGFR 88-102 08/24/2016 Creatinine 1.13, BUN 12, Potassium 4.4, Sodium 142  Recommendations: Left voice mail with ICM number and encouraged to call if experiencing any fluid symptoms.  Follow-up plan: ICM clinic phone appointment on 08/08/2017.    Copy of ICM check sent to Dr. [name].   3 month ICM trend: 07/07/2017    1 Year ICM trend:       [name], RN 07/08/2017 11:11 AM

## 2017-07-08 NOTE — Telephone Encounter (Signed)
Remote ICM transmission received.  Attempted call to patient and left detailed message per DPR regarding transmission and next ICM scheduled for 08/08/2017.  Advised to return call for any fluid symptoms or questions.    

## 2017-07-11 ENCOUNTER — Ambulatory Visit (INDEPENDENT_AMBULATORY_CARE_PROVIDER_SITE_OTHER): Payer: BLUE CROSS/BLUE SHIELD | Admitting: *Deleted

## 2017-07-11 DIAGNOSIS — I428 Other cardiomyopathies: Secondary | ICD-10-CM

## 2017-07-11 NOTE — Progress Notes (Signed)
Remote ICD transmission.   

## 2017-07-13 ENCOUNTER — Encounter: Payer: Self-pay | Admitting: Cardiology

## 2017-07-15 ENCOUNTER — Ambulatory Visit (INDEPENDENT_AMBULATORY_CARE_PROVIDER_SITE_OTHER): Payer: BLUE CROSS/BLUE SHIELD | Admitting: Family Medicine

## 2017-07-15 ENCOUNTER — Other Ambulatory Visit: Payer: Self-pay

## 2017-07-15 ENCOUNTER — Encounter: Payer: Self-pay | Admitting: Family Medicine

## 2017-07-15 VITALS — BP 128/72 | HR 96 | Temp 97.9°F | Resp 16 | Ht 72.44 in | Wt 332.0 lb

## 2017-07-15 DIAGNOSIS — E559 Vitamin D deficiency, unspecified: Secondary | ICD-10-CM

## 2017-07-15 DIAGNOSIS — Z6841 Body Mass Index (BMI) 40.0 and over, adult: Secondary | ICD-10-CM | POA: Diagnosis not present

## 2017-07-15 DIAGNOSIS — Z0000 Encounter for general adult medical examination without abnormal findings: Secondary | ICD-10-CM | POA: Diagnosis not present

## 2017-07-15 DIAGNOSIS — E7800 Pure hypercholesterolemia, unspecified: Secondary | ICD-10-CM

## 2017-07-15 DIAGNOSIS — I428 Other cardiomyopathies: Secondary | ICD-10-CM | POA: Diagnosis not present

## 2017-07-15 DIAGNOSIS — I5022 Chronic systolic (congestive) heart failure: Secondary | ICD-10-CM | POA: Diagnosis not present

## 2017-07-15 DIAGNOSIS — G4733 Obstructive sleep apnea (adult) (pediatric): Secondary | ICD-10-CM

## 2017-07-15 DIAGNOSIS — R3129 Other microscopic hematuria: Secondary | ICD-10-CM | POA: Diagnosis not present

## 2017-07-15 DIAGNOSIS — Z131 Encounter for screening for diabetes mellitus: Secondary | ICD-10-CM | POA: Diagnosis not present

## 2017-07-15 DIAGNOSIS — I10 Essential (primary) hypertension: Secondary | ICD-10-CM | POA: Diagnosis not present

## 2017-07-15 DIAGNOSIS — Z125 Encounter for screening for malignant neoplasm of prostate: Secondary | ICD-10-CM | POA: Diagnosis not present

## 2017-07-15 LAB — POCT URINALYSIS DIP (MANUAL ENTRY)
Bilirubin, UA: NEGATIVE
GLUCOSE UA: NEGATIVE mg/dL
Ketones, POC UA: NEGATIVE mg/dL
Leukocytes, UA: NEGATIVE
NITRITE UA: NEGATIVE
Protein Ur, POC: NEGATIVE mg/dL
RBC UA: NEGATIVE
Spec Grav, UA: 1.015 (ref 1.010–1.025)
UROBILINOGEN UA: 0.2 U/dL
pH, UA: 6 (ref 5.0–8.0)

## 2017-07-15 MED ORDER — FUROSEMIDE 40 MG PO TABS: 40.0000 mg | ORAL_TABLET | Freq: Every day | ORAL | 3 refills | Status: DC

## 2017-07-15 MED ORDER — CARVEDILOL 25 MG PO TABS: ORAL_TABLET | ORAL | 3 refills | Status: DC

## 2017-07-15 MED ORDER — SIMVASTATIN 40 MG PO TABS: 40.0000 mg | ORAL_TABLET | Freq: Every evening | ORAL | 3 refills | Status: DC

## 2017-07-15 NOTE — Patient Instructions (Addendum)
   IF you received an x-ray today, you will receive an invoice from [facility]. Please contact  Radiology at [phone] with questions or concerns regarding your invoice.   IF you received labwork today, you will receive an invoice from [facility]. Please contact [facility] at [phone] with questions or concerns regarding your invoice.   Our billing staff will not be able to assist you with questions regarding bills from these companies.  You will be contacted with the lab results as soon as they are available. The fastest way to get your results is to activate your My Chart account. Instructions are located on the last page of this paperwork. If you have not heard from us regarding the results in 2 weeks, please contact this office.      Preventive Care 40-64 Years, Male Preventive care refers to lifestyle choices and visits with your health care provider that can promote health and wellness. What does preventive care include?  A yearly physical exam. This is also called an annual well check.  Dental exams once or twice a year.  Routine eye exams. Ask your health care provider how often you should have your eyes checked.  Personal lifestyle choices, including: ? Daily care of your teeth and gums. ? Regular physical activity. ? Eating a healthy diet. ? Avoiding tobacco and drug use. ? Limiting alcohol use. ? Practicing safe sex. ? Taking low-dose aspirin every day starting at age 50. What happens during an annual well check? The services and screenings done by your health care provider during your annual well check will depend on your age, overall health, lifestyle risk factors, and family history of disease. Counseling Your health care provider may ask you questions about your:  Alcohol use.  Tobacco use.  Drug use.  Emotional well-being.  Home and relationship well-being.  Sexual activity.  Eating habits.  Work and work  environment.  Screening You may have the following tests or measurements:  Height, weight, and BMI.  Blood pressure.  Lipid and cholesterol levels. These may be checked every 5 years, or more frequently if you are over 50 years old.  Skin check.  Lung cancer screening. You may have this screening every year starting at age 55 if you have a 30-pack-year history of smoking and currently smoke or have quit within the past 15 years.  Fecal occult blood test (FOBT) of the stool. You may have this test every year starting at age 50.  Flexible sigmoidoscopy or colonoscopy. You may have a sigmoidoscopy every 5 years or a colonoscopy every 10 years starting at age 50.  Prostate cancer screening. Recommendations will vary depending on your family history and other risks.  Hepatitis C blood test.  Hepatitis B blood test.  Sexually transmitted disease (STD) testing.  Diabetes screening. This is done by checking your blood sugar (glucose) after you have not eaten for a while (fasting). You may have this done every 1-3 years.  Discuss your test results, treatment options, and if necessary, the need for more tests with your health care provider. Vaccines Your health care provider may recommend certain vaccines, such as:  Influenza vaccine. This is recommended every year.  Tetanus, diphtheria, and acellular pertussis (Tdap, Td) vaccine. You may need a Td booster every 10 years.  Varicella vaccine. You may need this if you have not been vaccinated.  Zoster vaccine. You may need this after age 60.  Measles, mumps, and rubella (MMR) vaccine. You may need at least one dose   of MMR if you were born in 1957 or later. You may also need a second dose.  Pneumococcal 13-valent conjugate (PCV13) vaccine. You may need this if you have certain conditions and have not been vaccinated.  Pneumococcal polysaccharide (PPSV23) vaccine. You may need one or two doses if you smoke cigarettes or if you have  certain conditions.  Meningococcal vaccine. You may need this if you have certain conditions.  Hepatitis A vaccine. You may need this if you have certain conditions or if you travel or work in places where you may be exposed to hepatitis A.  Hepatitis B vaccine. You may need this if you have certain conditions or if you travel or work in places where you may be exposed to hepatitis B.  Haemophilus influenzae type b (Hib) vaccine. You may need this if you have certain risk factors.  Talk to your health care provider about which screenings and vaccines you need and how often you need them. This information is not intended to replace advice given to you by your health care provider. Make sure you discuss any questions you have with your health care provider. Document Released: 05/16/2015 Document Revised: 01/07/2016 Document Reviewed: 02/18/2015 Elsevier Interactive Patient Education  [employer].

## 2017-07-15 NOTE — Progress Notes (Signed)
Subjective:    Patient ID: Gary Williamson, male    DOB: Apr 15, 1966, 52 y.o.   MRN: [id number]  07/15/2017  Annual Exam    HPI This 52 y.o. male presents for COMPLETE PHYSICAL EXAMINATION.  Last physical:  08-24-2016 Colonoscopy:  2016 Eye exam:    +glasses; eye exam 2018 April; yearly. Dental exam:  Every six months.  Obesity: underwent consultation by Dr. Leafy Ro.  Good nutrition guidance.     Visual Acuity Screening   Right eye Left eye Both eyes  Without correction:     With correction: 20/20 20/20 20/20     BP Readings from Last 3 Encounters:  07/15/17 128/72  05/18/17 128/78  02/14/17 128/79   Wt Readings from Last 3 Encounters:  07/15/17 (!) 332 lb (150.6 kg)  05/18/17 (!) 338 lb (153.3 kg)  02/14/17 (!) 325 lb (147.4 kg)   Immunization History  Administered Date(s) Administered  . Influenza Split 01/30/2013, 02/14/2014, 02/01/2015  . Influenza, Seasonal, Injecte, Preservative Fre 02/11/2012  . Influenza-Unspecified 02/13/2016, 02/05/2017  . Pneumococcal Polysaccharide-23 04/03/2013  . Tdap 04/12/2012   Health Maintenance  Topic Date Due  . INFLUENZA VACCINE  12/01/2017  . TETANUS/TDAP  04/12/2022  . COLONOSCOPY  09/15/2024  . Hepatitis C Screening  Completed  . HIV Screening  Completed   Review of Systems  Constitutional: Negative for activity change, appetite change, chills, diaphoresis, fatigue, fever and unexpected weight change.  HENT: Negative for congestion, dental problem, drooling, ear discharge, ear pain, facial swelling, hearing loss, mouth sores, nosebleeds, postnasal drip, rhinorrhea, sinus pressure, sneezing, sore throat, tinnitus, trouble swallowing and voice change.   Eyes: Negative for photophobia, pain, discharge, redness, itching and visual disturbance.  Respiratory: Negative for apnea, cough, choking, chest tightness, shortness of breath, wheezing and stridor.   Cardiovascular: Negative for chest pain, palpitations and leg swelling.    Gastrointestinal: Negative for abdominal pain, blood in stool, constipation, diarrhea, nausea and vomiting.  Endocrine: Negative for cold intolerance, heat intolerance, polydipsia, polyphagia and polyuria.  Genitourinary: Negative for decreased urine volume, difficulty urinating, discharge, dysuria, enuresis, flank pain, frequency, genital sores, hematuria, penile pain, penile swelling, scrotal swelling, testicular pain and urgency.       Nocturia x 1.  Urinary stream strong.   Some ED.  Musculoskeletal: Negative for arthralgias, back pain, gait problem, joint swelling, myalgias, neck pain and neck stiffness.  Skin: Negative for color change, pallor, rash and wound.  Allergic/Immunologic: Negative for environmental allergies, food allergies and immunocompromised state.  Neurological: Negative for dizziness, tremors, seizures, syncope, facial asymmetry, speech difficulty, weakness, light-headedness, numbness and headaches.  Hematological: Negative for adenopathy. Does not bruise/bleed easily.  Psychiatric/Behavioral: Negative for agitation, behavioral problems, confusion, decreased concentration, dysphoric mood, hallucinations, self-injury, sleep disturbance and suicidal ideas. The patient is not nervous/anxious and is not hyperactive.        Bedtime 1130; wakes up 630.    Past Medical History:  Diagnosis Date  . Acute idiopathic pericarditis 03/24/2010   Overview:  Viral Idiopathic Pericarditis   . Chronic systolic heart failure ([place])   . Hypertension   . LBBB (left bundle branch block)    Chronic  . Leg edema   . NICM (nonischemic cardiomyopathy) ([place])    Probably nonischemic; LHC in 2002/03 no sig CAD per report (done in [place]). Cardiomyopathy for 9-10 yrs. 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 Echo 5/11: severe LVE, EF <20%, diff HK, mod dias dys, sev. LAE. Hosp for CHF in 5/11. Ad. MV 6/11: EF 21%,  mild per-infart ischemia. LHC (7/11): EF 15%, no CAD, LVEDP 22; Echo (10/11): EF 20-25%;   Echo (12/12):  EF 20%, mod LVH, Gr 1 DD, diff HK, mild LAE  . OSA (obstructive sleep apnea)    cpap used  . Pure hypercholesterolemia   . Severe obesity ([place])   . Vitamin D deficiency    Past Surgical History:  Procedure Laterality Date  . BI-VENTRICULAR IMPLANTABLE CARDIOVERTER DEFIBRILLATOR N/A 08/21/2013   Procedure: BI-VENTRICULAR IMPLANTABLE CARDIOVERTER DEFIBRILLATOR  (CRT-D);  Surgeon: Coralyn Mark, MD;  Location: [facility] CATH LAB;  Service: Cardiovascular;  Laterality: N/A;  . CARDIAC CATHETERIZATION  2002 or 2003  . COLONOSCOPY N/A 09/16/2014   Procedure: COLONOSCOPY;  Surgeon: [name], MD;  Location: [facility] ENDOSCOPY;  Service: Endoscopy;  Laterality: N/A;  . LEFT HEART CATH  11/13/09   Dr [name]   No Known Allergies Current Outpatient Medications on File Prior to Visit  Medication Sig Dispense Refill  . aspirin 81 MG EC tablet Take 1 tablet (81 mg total) by mouth daily. 30 tablet 2  . Cholecalciferol (VITAMIN D) 2000 units CAPS Take by mouth daily.    Marland Kitchen ENTRESTO 97-103 MG TAKE ONE TABLET BY MOUTH TWICE DAILY 60 tablet 11  . fish oil-omega-3 fatty acids 1000 MG capsule Take 1 capsule (1 g total) by mouth daily. 30 capsule 2  . ipratropium (ATROVENT) 0.03 % nasal spray Place 2 sprays into both nostrils 2 (two) times daily. 30 mL 0  . Multiple Vitamin (MULTIVITAMIN) tablet Take 1 tablet by mouth daily.      Marland Kitchen spironolactone (ALDACTONE) 25 MG tablet TAKE 1 TABLET BY MOUTH DAILY. 90 tablet 3  . Vitamin D, Ergocalciferol, (DRISDOL) 50000 units CAPS capsule Take 1 capsule (50,000 Units total) by mouth every 7 (seven) days. 4 capsule 0   No current facility-administered medications on file prior to visit.    Social History   Socioeconomic History  . Marital status: Married    Spouse name: [name]  . Number of children: 2  . Years of education: Not on file  . Highest education level: Not on file  Occupational History  . 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  . Financial resource strain: Not on file  . Food insecurity:    Worry: Not on file    Inability: Not on file  . Transportation needs:    Medical: Not on file    Non-medical: Not on file  Tobacco Use  . Smoking status: Never Smoker  . Smokeless tobacco: Never Used  Substance and Sexual Activity  . Alcohol use: Yes    Alcohol/week: 0.0 - 1.0 oz    Comment: rare to occ.  . Drug use: No  . Sexual activity: Yes    Birth control/protection: Condom  Lifestyle  . Physical activity:    Days per week: Not on file    Minutes per session: Not on file  . Stress: Not on file  Relationships  . Social connections:    Talks on phone: Not on file    Gets together: Not on file    Attends religious service: Not on file    Active member of club or organization: Not on file    Attends meetings of clubs or organizations: Not on file    Relationship status: Not on file  . Intimate partner violence:    Fear of current  or ex partner: Not on file    Emotionally abused: Not on file    Physically abused: Not on file    Forced sexual activity: Not on file  Other Topics Concern  . Not on file  Social History Narrative   Marital status: married x 17 years; happily married      Children: 2 children (15,12)      Lives in [place] with his wife and 2 children      Employment: [employer]; Solicitor x 12 years; likes work.  Lots of traveling.       Tobacco: none      Alcohol:  1 drink per week      Exercise:  Walking three days per week; 20 minutes.       Seatbelt: 100%   Family History  Problem Relation Age of Onset  . Hypertension Mother   . Alzheimer's disease Mother   . Hyperlipidemia Mother   . Breast cancer Mother   . Cancer Mother        breast cancer  . Stroke Maternal Grandmother   . Arthritis Father   . Dementia Father   . Hypertension Father   . Alzheimer's disease Father   . COPD Brother   . Coronary  artery disease Neg Hx   . Heart failure Neg Hx   . Heart disease Neg Hx        Objective:    BP 128/72   Pulse 96   Temp 97.9 F (36.6 C) (Oral)   Resp 16   Ht 6' 0.44" (1.84 m)   Wt (!) 332 lb (150.6 kg)   SpO2 96%   BMI 44.48 kg/m  Physical Exam  Constitutional: He is oriented to person, place, and time. He appears well-developed and well-nourished. No distress.  HENT:  Head: Normocephalic and atraumatic.  Right Ear: External ear normal.  Left Ear: External ear normal.  Nose: Nose normal.  Mouth/Throat: Oropharynx is clear and moist.  Eyes: Conjunctivae and EOM are normal. Pupils are equal, round, and reactive to light.  Neck: Normal range of motion. Neck supple. Carotid bruit is not present. No thyromegaly present.  Cardiovascular: Normal rate, regular rhythm, normal heart sounds and intact distal pulses. Exam reveals no gallop and no friction rub.  No murmur heard. Pulmonary/Chest: Effort normal and breath sounds normal. He has no wheezes. He has no rales.  Abdominal: Soft. Bowel sounds are normal. He exhibits no distension and no mass. There is no tenderness. There is no rebound and no guarding.  Genitourinary: Penis normal.  Musculoskeletal:       Right shoulder: Normal.       Left shoulder: Normal.       Cervical back: Normal.  Lymphadenopathy:    He has no cervical adenopathy.  Neurological: He is alert and oriented to person, place, and time. He has normal reflexes. No cranial nerve deficit. He exhibits normal muscle tone. Coordination normal.  Skin: Skin is warm and dry. No rash noted. He is not diaphoretic.  Psychiatric: He has a normal mood and affect. His behavior is normal. Judgment and thought content normal.   No results found. Depression screen [facility] 2/9 07/15/2017 05/18/2017 11/22/2016 11/11/2016 08/24/2016  Decreased Interest 0 0 1 0 0  Down, Depressed, Hopeless 0 0 0 0 0  PHQ - 2 Score 0 0 1 0 0  Altered sleeping - - 1 - -  Tired, decreased energy - - 1 -  -  Change in appetite - -  0 - -  Feeling bad or failure about yourself  - - 0 - -  Trouble concentrating - - 0 - -  Moving slowly or fidgety/restless - - 0 - -  Suicidal thoughts - - 0 - -  PHQ-9 Score - - 3 - -   Fall Risk  07/15/2017 05/18/2017 11/11/2016 08/24/2016 02/24/2016  Falls in the past year? No No No No No        Assessment & Plan:   1. Routine physical examination   2. Chronic systolic heart failure ([place])   3. Essential hypertension   4. OSA (obstructive sleep apnea)   5. Vitamin D deficiency   6. NICM (nonischemic cardiomyopathy) ([place])   7. Hematuria, microscopic   8. Pure hypercholesterolemia   9. Screening for prostate cancer   10. Screening for diabetes mellitus   11. Class 3 severe obesity with serious comorbidity and body mass index (BMI) of 40.0 to 44.9 in adult, unspecified obesity type (HCC) Chronic    -anticipatory guidance provided --- exercise, weight loss, safe driving practices, aspirin 81mg  daily. -obtain age appropriate screening labs and labs for chronic disease management. Recommend weight loss, exercise for 30-60 minutes five days per week; recommend 1800 kcal restriction per day with a minimum of 60 grams of protein per day.  Eat 3 meals per day. Do not skip meals. Consider having a protein shake as a meal replacement to aid with eliminating meal skipping. Look for products with <220 calories, <7 gm sugar, and 20-30 gm protein.  Eat breakfast within 2 hours of getting up.   Make  your plate non-starchy vegetables,  protein, and  carbohydrates at lunch and dinner.   Aim for at least 64 oz. of calorie-free beverages daily (water, Crystal Light, diet green tea, etc.). Eliminate any sugary beverages such as regular soda, sweet tea, or fruit juice.   Pay attention to hunger and fullness cues.  Stop eating once you feel satisfied; don't wait until you feel full, stuffed, or sick from eating.  Choose lean meats and low fat/fat free dairy  products.  Choose foods high in fiber such as fruits, vegetables, and whole grains (brown rice, whole wheat pasta, whole wheat bread, etc.).  Limit foods with added sugar to <7 gm per serving.  Always eat in the kitchen/dining room.  Never eat in the bedroom or in front of the TV.   -followed closely by cardiology for chronic systolic heart failure with ICD and non-ischemic cardiomyopathy.  Orders Placed This Encounter  Procedures  . Comprehensive metabolic panel    Order Specific Question:   Has the patient fasted?    Answer:   No  . CBC with Differential/Platelet  . Hemoglobin A1c  . Lipid panel    Order Specific Question:   Has the patient fasted?    Answer:   No  . PSA  . TSH  . POCT urinalysis dipstick   Meds ordered this encounter  Medications  . carvedilol (COREG) 25 MG tablet    Sig: TAKE 1 TABLET BY MOUTH TWICE A DAY    Dispense:  180 tablet    Refill:  3  . furosemide (LASIX) 40 MG tablet    Sig: Take 1 tablet (40 mg total) by mouth daily. Office visit needed    Dispense:  90 tablet    Refill:  3  . simvastatin (ZOCOR) 40 MG tablet    Sig: Take 1 tablet (40 mg total) by mouth every evening.  Dispense:  90 tablet    Refill:  3    Return in about 6 months (around 01/15/2018) for follow-up chronic medical conditions.   Benicia Bergevin Elayne Guerin, M.D. Primary Care at [facility] previously [facility] [address] [place], [place]  [address] [phone] phone [phone] fax

## 2017-07-16 LAB — CBC WITH DIFFERENTIAL/PLATELET
Basophils Absolute: 0 10*3/uL (ref 0.0–0.2)
Basos: 0 %
EOS (ABSOLUTE): 0.2 10*3/uL (ref 0.0–0.4)
EOS: 2 %
HEMATOCRIT: 40.5 % (ref 37.5–51.0)
HEMOGLOBIN: 13.8 g/dL (ref 13.0–17.7)
IMMATURE GRANULOCYTES: 0 %
Immature Grans (Abs): 0 10*3/uL (ref 0.0–0.1)
Lymphocytes Absolute: 1.9 10*3/uL (ref 0.7–3.1)
Lymphs: 23 %
MCH: 30 pg (ref 26.6–33.0)
MCHC: 34.1 g/dL (ref 31.5–35.7)
MCV: 88 fL (ref 79–97)
MONOCYTES: 8 %
Monocytes Absolute: 0.6 10*3/uL (ref 0.1–0.9)
NEUTROS PCT: 67 %
Neutrophils Absolute: 5.5 10*3/uL (ref 1.4–7.0)
Platelets: 206 10*3/uL (ref 150–379)
RBC: 4.6 x10E6/uL (ref 4.14–5.80)
RDW: 14.5 % (ref 12.3–15.4)
WBC: 8.2 10*3/uL (ref 3.4–10.8)

## 2017-07-16 LAB — LIPID PANEL
CHOLESTEROL TOTAL: 146 mg/dL (ref 100–199)
Chol/HDL Ratio: 5.4 ratio — ABNORMAL HIGH (ref 0.0–5.0)
HDL: 27 mg/dL — AB (ref >39)
LDL Calculated: 93 mg/dL (ref 0–99)
Triglycerides: 130 mg/dL (ref 0–149)
VLDL CHOLESTEROL CAL: 26 mg/dL (ref 5–40)

## 2017-07-16 LAB — COMPREHENSIVE METABOLIC PANEL
ALBUMIN: 4 g/dL (ref 3.5–5.5)
ALK PHOS: 68 IU/L (ref 39–117)
ALT: 19 IU/L (ref 0–44)
AST: 19 IU/L (ref 0–40)
Albumin/Globulin Ratio: 1.5 (ref 1.2–2.2)
BUN / CREAT RATIO: 13 (ref 9–20)
BUN: 15 mg/dL (ref 6–24)
Bilirubin Total: 0.4 mg/dL (ref 0.0–1.2)
CALCIUM: 9.1 mg/dL (ref 8.7–10.2)
CO2: 24 mmol/L (ref 20–29)
CREATININE: 1.17 mg/dL (ref 0.76–1.27)
Chloride: 105 mmol/L (ref 96–106)
GFR calc Af Amer: 82 mL/min/{1.73_m2} (ref >59)
GFR calc non Af Amer: 71 mL/min/{1.73_m2} (ref >59)
GLUCOSE: 111 mg/dL — AB (ref 65–99)
Globulin, Total: 2.7 g/dL (ref 1.5–4.5)
Potassium: 4.3 mmol/L (ref 3.5–5.2)
Sodium: 144 mmol/L (ref 134–144)
Total Protein: 6.7 g/dL (ref 6.0–8.5)

## 2017-07-16 LAB — PSA: PROSTATE SPECIFIC AG, SERUM: 0.7 ng/mL (ref 0.0–4.0)

## 2017-07-16 LAB — HEMOGLOBIN A1C
ESTIMATED AVERAGE GLUCOSE: 128 mg/dL
Hgb A1c MFr Bld: 6.1 % — ABNORMAL HIGH (ref 4.8–5.6)

## 2017-07-16 LAB — TSH: TSH: 3.74 u[IU]/mL (ref 0.450–4.500)

## 2017-07-20 ENCOUNTER — Encounter: Payer: BLUE CROSS/BLUE SHIELD | Admitting: Family Medicine

## 2017-07-30 LAB — CUP PACEART REMOTE DEVICE CHECK
Battery Remaining Longevity: 41 mo
Battery Remaining Percentage: 47 %
Battery Voltage: 2.93 V
Brady Statistic RA Percent Paced: 2.9 %
Date Time Interrogation Session: 20190308061500
HIGH POWER IMPEDANCE MEASURED VALUE: 64 Ohm
HIGH POWER IMPEDANCE MEASURED VALUE: 64 Ohm
Implantable Lead Implant Date: 20150421
Implantable Lead Implant Date: 20150421
Implantable Lead Location: 753860
Implantable Pulse Generator Implant Date: 20150421
Lead Channel Impedance Value: 340 Ohm
Lead Channel Impedance Value: 430 Ohm
Lead Channel Pacing Threshold Amplitude: 0.5 V
Lead Channel Pacing Threshold Amplitude: 0.75 V
Lead Channel Pacing Threshold Pulse Width: 0.5 ms
Lead Channel Sensing Intrinsic Amplitude: 3.6 mV
Lead Channel Sensing Intrinsic Amplitude: 7.3 mV
Lead Channel Setting Pacing Amplitude: 2 V
Lead Channel Setting Pacing Amplitude: 2 V
Lead Channel Setting Pacing Pulse Width: 0.5 ms
Lead Channel Setting Pacing Pulse Width: 0.5 ms
MDC IDC LEAD IMPLANT DT: 20150421
MDC IDC LEAD LOCATION: 753857
MDC IDC LEAD LOCATION: 753859
MDC IDC MSMT LEADCHNL LV IMPEDANCE VALUE: 850 Ohm
MDC IDC MSMT LEADCHNL LV PACING THRESHOLD AMPLITUDE: 0.75 V
MDC IDC MSMT LEADCHNL RA PACING THRESHOLD PULSEWIDTH: 0.5 ms
MDC IDC MSMT LEADCHNL RV PACING THRESHOLD PULSEWIDTH: 0.5 ms
MDC IDC SET LEADCHNL LV PACING AMPLITUDE: 2 V
MDC IDC SET LEADCHNL RV SENSING SENSITIVITY: 0.5 mV
MDC IDC STAT BRADY AP VP PERCENT: 3.1 %
MDC IDC STAT BRADY AP VS PERCENT: 1 %
MDC IDC STAT BRADY AS VP PERCENT: 96 %
MDC IDC STAT BRADY AS VS PERCENT: 1.2 %
Pulse Gen Serial Number: 7157119

## 2017-08-08 ENCOUNTER — Ambulatory Visit (INDEPENDENT_AMBULATORY_CARE_PROVIDER_SITE_OTHER): Payer: BLUE CROSS/BLUE SHIELD

## 2017-08-08 DIAGNOSIS — I5022 Chronic systolic (congestive) heart failure: Secondary | ICD-10-CM

## 2017-08-08 DIAGNOSIS — Z95810 Presence of automatic (implantable) cardiac defibrillator: Secondary | ICD-10-CM

## 2017-08-08 NOTE — Progress Notes (Signed)
EPIC Encounter for ICM Monitoring  Patient Name: Gary Williamson is a 52 y.o. male Date: 08/08/2017 Primary Care Physican: Wardell Honour, MD Primary Cardiologist: [name] Electrophysiologist: [name] Dry Weight:unknown Bi-V Pacing: 99%      Attempted call to patient and unable to reach.  Left detailed message regarding transmission.  Transmission reviewed.    Thoracic impedance abnormal suggesting fluid accumulation starting 08/04/2017 and returned to baseline today.  Impedance also decreased from 3/20 to 3/27.  Prescribed dosage: Furosemide 40 mg 1 tablet daily  Labs: 02/01/2017 Creatinine 1.10, BUN 14, Potassium 4.1, Sodium 137 11/22/2016 Creatinine 0.98, BUN 12, Potassium 4.1, Sodium 144, EGFR 88-102 08/24/2016 Creatinine 1.13, BUN 12, Potassium 4.4, Sodium 142  Recommendations: Left voice mail with ICM number and encouraged to call if experiencing any fluid symptoms.  Follow-up plan: ICM clinic phone appointment on 09/08/2017.    Copy of ICM check sent to Dr. [name].   3 month ICM trend: 08/08/2017    1 Year ICM trend:       [name], RN 08/08/2017 2:10 PM

## 2017-08-09 ENCOUNTER — Telehealth: Payer: Self-pay

## 2017-08-09 NOTE — Telephone Encounter (Signed)
Remote ICM transmission received.  Attempted call to patient and left detailed message per DPR regarding transmission and next ICM scheduled for 09/08/2017.  Advised to return call for any fluid symptoms or questions.    

## 2017-08-10 ENCOUNTER — Encounter: Payer: Self-pay | Admitting: Physician Assistant

## 2017-09-08 ENCOUNTER — Telehealth: Payer: Self-pay | Admitting: Cardiology

## 2017-09-08 NOTE — Telephone Encounter (Signed)
Spoke with pt and reminded pt of remote transmission that is due today. Pt verbalized understanding.   

## 2017-09-09 NOTE — Progress Notes (Signed)
No ICM remote transmission received for 09/08/2017 and next ICM transmission scheduled for 10/10/2017.

## 2017-09-13 ENCOUNTER — Telehealth: Payer: Self-pay

## 2017-09-13 ENCOUNTER — Ambulatory Visit (INDEPENDENT_AMBULATORY_CARE_PROVIDER_SITE_OTHER): Payer: BLUE CROSS/BLUE SHIELD

## 2017-09-13 DIAGNOSIS — I5022 Chronic systolic (congestive) heart failure: Secondary | ICD-10-CM | POA: Diagnosis not present

## 2017-09-13 DIAGNOSIS — Z95810 Presence of automatic (implantable) cardiac defibrillator: Secondary | ICD-10-CM

## 2017-09-13 NOTE — Telephone Encounter (Signed)
Remote ICM transmission received.  Attempted call to patient and left detailed message, per DPR, to return call regarding transmission.    

## 2017-09-13 NOTE — Progress Notes (Signed)
EPIC Encounter for ICM Monitoring  Patient Name: Gary Williamson is a 52 y.o. male Date: 09/13/2017 Primary Care Physican: Wardell Honour, MD Primary Cardiologist: [name] Electrophysiologist: [name] Dry Weight:unknown Bi-V Pacing: 99%        Attempted call to patient and unable to reach.  Left message to return call regarding transmission.  Transmission reviewed.    Thoracic impedance abnormal suggesting fluid accumulation since 09/08/2017.  Prescribed dosage: Furosemide 40 mg 1 tablet daily  Labs: 07/15/2017 Creatinine 1.17, BUN 15, Potassium 4.3, Sodium 144, EGFR 71-82 02/01/2017 Creatinine 1.10, BUN 14, Potassium 4.1, Sodium 137 11/22/2016 Creatinine 0.98, BUN 12, Potassium 4.1, Sodium 144, EGFR 88-102 08/24/2016 Creatinine 1.13, BUN 12, Potassium 4.4, Sodium 142  Recommendations: NONE - Unable to reach.  Follow-up plan: ICM clinic phone appointment on 09/22/2017 to recheck fluid levels.  Recall for March 2019 for Dr [name] visit.  Copy of ICM check sent to Dr. [name] and Dr. [name].   3 month ICM trend: 09/12/2017    1 Year ICM trend:       [name], RN 09/13/2017 7:54 AM

## 2017-09-22 ENCOUNTER — Ambulatory Visit (INDEPENDENT_AMBULATORY_CARE_PROVIDER_SITE_OTHER): Payer: Self-pay

## 2017-09-22 DIAGNOSIS — I5022 Chronic systolic (congestive) heart failure: Secondary | ICD-10-CM

## 2017-09-22 DIAGNOSIS — Z95810 Presence of automatic (implantable) cardiac defibrillator: Secondary | ICD-10-CM

## 2017-09-23 ENCOUNTER — Telehealth: Payer: Self-pay

## 2017-09-23 NOTE — Progress Notes (Signed)
EPIC Encounter for ICM Monitoring  Patient Name: Gary Williamson is a 52 y.o. male Date: 09/23/2017 Primary Care Physican: Wardell Honour, MD Primary Cardiologist: [name] Electrophysiologist: [name] Dry Weight:unknown Bi-V Pacing: 99%      Attempted call to patient and unable to reach.  Left detailed message, per DPR, regarding transmission.  Transmission reviewed.    Thoracic impedance returned to normal since 5/14 remote transmission.   Prescribed dosage: Furosemide 40 mg 1 tablet daily  Labs: 07/15/2017 Creatinine 1.17, BUN 15, Potassium 4.3, Sodium 144, EGFR 71-82 02/01/2017 Creatinine 1.10, BUN 14, Potassium 4.1, Sodium 137 11/22/2016 Creatinine 0.98, BUN 12, Potassium 4.1, Sodium 144, EGFR 88-102 08/24/2016 Creatinine 1.13, BUN 12, Potassium 4.4, Sodium 142  Recommendations: Left voice mail with ICM number and encouraged to call if experiencing any fluid symptoms.  Follow-up plan: ICM clinic phone appointment on 10/17/2017.  Recall for March 2019 for Dr [name] visit.  Copy of ICM check sent to Dr. [name].   3 month ICM trend: 09/22/2017    1 Year ICM trend:       [name], RN 09/23/2017 9:58 AM

## 2017-09-23 NOTE — Telephone Encounter (Signed)
Remote ICM transmission received.  Attempted call to patient and left detailed message, per DPR, regarding transmission and next ICM scheduled for 10/17/2017.  Advised to return call for any fluid symptoms or questions.

## 2017-09-26 ENCOUNTER — Encounter: Payer: Self-pay | Admitting: Family Medicine

## 2017-10-17 ENCOUNTER — Ambulatory Visit (INDEPENDENT_AMBULATORY_CARE_PROVIDER_SITE_OTHER): Payer: BLUE CROSS/BLUE SHIELD

## 2017-10-17 ENCOUNTER — Telehealth: Payer: Self-pay

## 2017-10-17 ENCOUNTER — Ambulatory Visit (INDEPENDENT_AMBULATORY_CARE_PROVIDER_SITE_OTHER): Payer: BLUE CROSS/BLUE SHIELD | Admitting: *Deleted

## 2017-10-17 DIAGNOSIS — Z95810 Presence of automatic (implantable) cardiac defibrillator: Secondary | ICD-10-CM

## 2017-10-17 DIAGNOSIS — I5022 Chronic systolic (congestive) heart failure: Secondary | ICD-10-CM

## 2017-10-17 DIAGNOSIS — I428 Other cardiomyopathies: Secondary | ICD-10-CM

## 2017-10-17 NOTE — Progress Notes (Signed)
Remote ICD transmission.   

## 2017-10-17 NOTE — Progress Notes (Signed)
EPIC Encounter for ICM Monitoring  Patient Name: Gary Williamson is a 52 y.o. male Date: 10/17/2017 Primary Care Physican: Wardell Honour, MD Primary Cardiologist: [name] Electrophysiologist: [name] Dry Weight:unknown Bi-V Pacing: 99%       Attempted call to patient and unable to reach.  Left detailed message, per DPR, regarding transmission.  Transmission reviewed.    Thoracic impedance close to baseline normal.  Prescribed dosage: Furosemide 40 mg 1 tablet daily  Labs: 07/15/2017 Creatinine 1.17, BUN 15, Potassium 4.3, Sodium 144, EGFR 71-82 02/01/2017 Creatinine 1.10, BUN 14, Potassium 4.1, Sodium 137 11/22/2016 Creatinine 0.98, BUN 12, Potassium 4.1, Sodium 144, EGFR 88-102 08/24/2016 Creatinine 1.13, BUN 12, Potassium 4.4, Sodium 142  Recommendations: Left voice mail with ICM number and encouraged to call if experiencing any fluid symptoms.  Follow-up plan: ICM clinic phone appointment on 11/17/2017.    Copy of ICM check sent to Dr. [name].   3 month ICM trend: 10/17/2017    1 Year ICM trend:       Gary Billings, RN 10/17/2017 11:36 AM

## 2017-10-17 NOTE — Telephone Encounter (Signed)
Remote ICM transmission received.  Attempted call to patient and left detailed message, per DPR, regarding transmission and next ICM scheduled for 11/17/2017.  Advised to return call for any fluid symptoms or questions.    

## 2017-10-29 LAB — CUP PACEART REMOTE DEVICE CHECK
Battery Remaining Longevity: 38 mo
Battery Remaining Percentage: 43 %
Battery Voltage: 2.92 V
Brady Statistic AS VS Percent: 1.1 %
HIGH POWER IMPEDANCE MEASURED VALUE: 71 Ohm
HighPow Impedance: 71 Ohm
Implantable Lead Implant Date: 20150421
Implantable Lead Implant Date: 20150421
Implantable Lead Location: 753857
Implantable Lead Location: 753860
Implantable Pulse Generator Implant Date: 20150421
Lead Channel Impedance Value: 360 Ohm
Lead Channel Impedance Value: 440 Ohm
Lead Channel Pacing Threshold Pulse Width: 0.5 ms
Lead Channel Pacing Threshold Pulse Width: 0.5 ms
Lead Channel Sensing Intrinsic Amplitude: 4.4 mV
Lead Channel Setting Pacing Amplitude: 2 V
Lead Channel Setting Pacing Amplitude: 2 V
Lead Channel Setting Sensing Sensitivity: 0.5 mV
MDC IDC LEAD IMPLANT DT: 20150421
MDC IDC LEAD LOCATION: 753859
MDC IDC MSMT LEADCHNL LV IMPEDANCE VALUE: 830 Ohm
MDC IDC MSMT LEADCHNL LV PACING THRESHOLD AMPLITUDE: 0.75 V
MDC IDC MSMT LEADCHNL RA PACING THRESHOLD AMPLITUDE: 0.625 V
MDC IDC MSMT LEADCHNL RA PACING THRESHOLD PULSEWIDTH: 0.5 ms
MDC IDC MSMT LEADCHNL RV PACING THRESHOLD AMPLITUDE: 0.625 V
MDC IDC MSMT LEADCHNL RV SENSING INTR AMPL: 6.7 mV
MDC IDC SESS DTM: 20190617060022
MDC IDC SET LEADCHNL LV PACING AMPLITUDE: 2 V
MDC IDC SET LEADCHNL LV PACING PULSEWIDTH: 0.5 ms
MDC IDC SET LEADCHNL RV PACING PULSEWIDTH: 0.5 ms
MDC IDC STAT BRADY AP VP PERCENT: 2.8 %
MDC IDC STAT BRADY AP VS PERCENT: 1 %
MDC IDC STAT BRADY AS VP PERCENT: 96 %
MDC IDC STAT BRADY RA PERCENT PACED: 2.6 %
Pulse Gen Serial Number: 7157119

## 2017-11-17 ENCOUNTER — Ambulatory Visit (INDEPENDENT_AMBULATORY_CARE_PROVIDER_SITE_OTHER): Payer: BLUE CROSS/BLUE SHIELD

## 2017-11-17 DIAGNOSIS — Z95810 Presence of automatic (implantable) cardiac defibrillator: Secondary | ICD-10-CM | POA: Diagnosis not present

## 2017-11-17 DIAGNOSIS — I5022 Chronic systolic (congestive) heart failure: Secondary | ICD-10-CM | POA: Diagnosis not present

## 2017-11-18 NOTE — Progress Notes (Signed)
EPIC Encounter for ICM Monitoring  Patient Name: Gary Williamson is a 51 y.o. male Date: 11/18/2017 Primary Care Physican: Smith, Kristi M, MD Primary Cardiologist: [place] Electrophysiologist: [name] Dry Weight:unknown Bi-V Pacing: 99%       Attempted call to patient and unable to reach.  Left detailed message, per DPR, regarding transmission.  Transmission reviewed.    Thoracic impedance normal.  Prescribed dosage: Furosemide 40 mg 1 tablet daily  Labs: 07/15/2017 Creatinine 1.17, BUN 15, Potassium 4.3, Sodium 144, EGFR 71-82 02/01/2017 Creatinine 1.10, BUN 14, Potassium 4.1, Sodium 137 11/22/2016 Creatinine 0.98, BUN 12, Potassium 4.1, Sodium 144, EGFR 88-102 08/24/2016 Creatinine 1.13, BUN 12, Potassium 4.4, Sodium 142  Recommendations: Left voice mail with ICM number and encouraged to call if experiencing any fluid symptoms.  Follow-up plan: ICM clinic phone appointment on 12/19/2017.       Copy of ICM check sent to Dr. [name].   3 month ICM trend: 11/17/2017    1 Year ICM trend:       Laurie S Short, RN 11/18/2017 11:17 AM   

## 2017-11-23 ENCOUNTER — Ambulatory Visit: Payer: BLUE CROSS/BLUE SHIELD | Admitting: Family Medicine

## 2017-11-23 ENCOUNTER — Other Ambulatory Visit: Payer: Self-pay

## 2017-11-23 ENCOUNTER — Encounter: Payer: Self-pay | Admitting: Family Medicine

## 2017-11-23 VITALS — BP 126/76 | HR 94 | Temp 98.6°F | Resp 16 | Ht 73.23 in | Wt 343.4 lb

## 2017-11-23 DIAGNOSIS — I10 Essential (primary) hypertension: Secondary | ICD-10-CM | POA: Diagnosis not present

## 2017-11-23 DIAGNOSIS — I5022 Chronic systolic (congestive) heart failure: Secondary | ICD-10-CM | POA: Diagnosis not present

## 2017-11-23 DIAGNOSIS — G4733 Obstructive sleep apnea (adult) (pediatric): Secondary | ICD-10-CM

## 2017-11-23 DIAGNOSIS — E7439 Other disorders of intestinal carbohydrate absorption: Secondary | ICD-10-CM

## 2017-11-23 DIAGNOSIS — I428 Other cardiomyopathies: Secondary | ICD-10-CM | POA: Diagnosis not present

## 2017-11-23 DIAGNOSIS — E7800 Pure hypercholesterolemia, unspecified: Secondary | ICD-10-CM | POA: Diagnosis not present

## 2017-11-23 NOTE — Progress Notes (Signed)
Subjective:    Patient ID: Gary Williamson, male    DOB: 01-12-1966, 52 y.o.   MRN: [id number]  11/23/2017  Chronic Conditions (4 month follow-up )    HPI This 52 y.o. male presents for six month follow-up hypertension, hypercholesterolemia, glucose intolerance, Obesity, CHF with cardiomyopathy.  No changes to management made at last visit.  Patient reports good compliance with medication, good tolerance to medication, and good symptom control.   Considering keto diet his coworker has lost 55 pounds in 6 months with this attempt.  Patient did participate in [place] years ago with successful weight loss.  Patient and wife are walking every morning for 15 to 20 minutes.  No concerns today.  Asymptomatic.   BP Readings from Last 3 Encounters:  11/23/17 126/76  07/15/17 128/72  05/18/17 128/78   Wt Readings from Last 3 Encounters:  11/23/17 (!) 343 lb 6.4 oz (155.8 kg)  07/15/17 (!) 332 lb (150.6 kg)  05/18/17 (!) 338 lb (153.3 kg)   Immunization History  Administered Date(s) Administered  . Influenza Split 01/30/2013, 02/14/2014, 02/01/2015  . Influenza, Seasonal, Injecte, Preservative Fre 02/11/2012  . Influenza-Unspecified 02/13/2016, 02/05/2017  . Pneumococcal Polysaccharide-23 04/03/2013  . Tdap 04/12/2012    Review of Systems  Constitutional: Negative for activity change, appetite change, chills, diaphoresis, fatigue, fever and unexpected weight change.  HENT: Negative for congestion, dental problem, drooling, ear discharge, ear pain, facial swelling, hearing loss, mouth sores, nosebleeds, postnasal drip, rhinorrhea, sinus pressure, sneezing, sore throat, tinnitus, trouble swallowing and voice change.   Eyes: Negative for photophobia, pain, discharge, redness, itching and visual disturbance.  Respiratory: Negative for apnea, cough, choking, chest tightness, shortness of breath, wheezing and stridor.   Cardiovascular: Negative for chest pain, palpitations and leg  swelling.  Gastrointestinal: Negative for abdominal pain, blood in stool, constipation, diarrhea, nausea and vomiting.  Endocrine: Negative for cold intolerance, heat intolerance, polydipsia, polyphagia and polyuria.  Genitourinary: Negative for decreased urine volume, difficulty urinating, discharge, dysuria, enuresis, flank pain, frequency, genital sores, hematuria, penile pain, penile swelling, scrotal swelling, testicular pain and urgency.  Musculoskeletal: Negative for arthralgias, back pain, gait problem, joint swelling, myalgias, neck pain and neck stiffness.  Skin: Negative for color change, pallor, rash and wound.  Allergic/Immunologic: Negative for environmental allergies, food allergies and immunocompromised state.  Neurological: Negative for dizziness, tremors, seizures, syncope, facial asymmetry, speech difficulty, weakness, light-headedness, numbness and headaches.  Hematological: Negative for adenopathy. Does not bruise/bleed easily.  Psychiatric/Behavioral: Negative for agitation, behavioral problems, confusion, decreased concentration, dysphoric mood, hallucinations, self-injury, sleep disturbance and suicidal ideas. The patient is not nervous/anxious and is not hyperactive.     Past Medical History:  Diagnosis Date  . Acute idiopathic pericarditis 03/24/2010   Overview:  Viral Idiopathic Pericarditis   . Chronic systolic heart failure ([place])   . Hypertension   . LBBB (left bundle branch block)    Chronic  . Leg edema   . NICM (nonischemic cardiomyopathy) ([place])    Probably nonischemic; LHC in 2002/03 no sig CAD per report (done in [place]). Cardiomyopathy for 9-10 yrs. 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 Echo 5/11: severe LVE, EF <20%, diff HK, mod dias dys, sev. LAE. Hosp for CHF in 5/11. Ad. MV 6/11: EF 21%, mild per-infart ischemia. LHC (7/11): EF 15%, no CAD, LVEDP 22; Echo (10/11): EF 20-25%;  Echo (12/12):  EF 20%, mod LVH, Gr 1 DD, diff HK, mild LAE  . OSA (obstructive  sleep apnea)    cpap used  .  Pure hypercholesterolemia   . Severe obesity ([place])   . Vitamin D deficiency    Past Surgical History:  Procedure Laterality Date  . BI-VENTRICULAR IMPLANTABLE CARDIOVERTER DEFIBRILLATOR N/A 08/21/2013   Procedure: BI-VENTRICULAR IMPLANTABLE CARDIOVERTER DEFIBRILLATOR  (CRT-D);  Surgeon: Coralyn Mark, MD;  Location: [facility] CATH LAB;  Service: Cardiovascular;  Laterality: N/A;  . CARDIAC CATHETERIZATION  2002 or 2003  . COLONOSCOPY N/A 09/16/2014   Procedure: COLONOSCOPY;  Surgeon: [name], MD;  Location: [facility] ENDOSCOPY;  Service: Endoscopy;  Laterality: N/A;  . LEFT HEART CATH  11/13/09   Dr [name]   No Known Allergies Current Outpatient Medications on File Prior to Visit  Medication Sig Dispense Refill  . aspirin 81 MG EC tablet Take 1 tablet (81 mg total) by mouth daily. 30 tablet 2  . carvedilol (COREG) 25 MG tablet TAKE 1 TABLET BY MOUTH TWICE A DAY 180 tablet 3  . Cholecalciferol (VITAMIN D) 2000 units CAPS Take by mouth daily.    Marland Kitchen ENTRESTO 97-103 MG TAKE ONE TABLET BY MOUTH TWICE DAILY 60 tablet 11  . fish oil-omega-3 fatty acids 1000 MG capsule Take 1 capsule (1 g total) by mouth daily. 30 capsule 2  . furosemide (LASIX) 40 MG tablet Take 1 tablet (40 mg total) by mouth daily. Office visit needed 90 tablet 3  . Multiple Vitamin (MULTIVITAMIN) tablet Take 1 tablet by mouth daily.      . simvastatin (ZOCOR) 40 MG tablet Take 1 tablet (40 mg total) by mouth every evening. 90 tablet 3  . spironolactone (ALDACTONE) 25 MG tablet TAKE 1 TABLET BY MOUTH DAILY. 90 tablet 3   No current facility-administered medications on file prior to visit.    Social History   Socioeconomic History  . Marital status: Married    Spouse name: [name]  . Number of children: 2  . Years of education: Not on file  . Highest education level: Not on file  Occupational History  . 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 Financial resource strain: Not on file  . Food insecurity:    Worry: Not on file    Inability: Not on file  . Transportation needs:    Medical: Not on file    Non-medical: Not on file  Tobacco Use  . Smoking status: Never Smoker  . Smokeless tobacco: Never Used  Substance and Sexual Activity  . Alcohol use: Yes    Alcohol/week: 0.0 - 1.2 oz    Comment: rare to occ.  . Drug use: No  . Sexual activity: Yes    Birth control/protection: Condom  Lifestyle  . Physical activity:    Days per week: Not on file    Minutes per session: Not on file  . Stress: Not on file  Relationships  . Social connections:    Talks on phone: Not on file    Gets together: Not on file    Attends religious service: Not on file    Active member of club or organization: Not on file    Attends meetings of clubs or organizations: Not on file    Relationship status: Not on file  . Intimate partner violence:    Fear of current or ex partner: Not on file    Emotionally abused: Not on file    Physically abused: Not on file    Forced sexual activity: Not on  file  Other Topics Concern  . Not on file  Social History Narrative   Marital status: married x 17 years; happily married      Children: 2 children (15,12)      Lives in [place] with his wife and 2 children      Employment: [employer]; Solicitor x 12 years; likes work.  Lots of traveling.       Tobacco: none      Alcohol:  1 drink per week      Exercise:  Walking three days per week; 20 minutes.       Seatbelt: 100%   Family History  Problem Relation Age of Onset  . Hypertension Mother   . Alzheimer's disease Mother   . Hyperlipidemia Mother   . Breast cancer Mother   . Cancer Mother        breast cancer  . Stroke Maternal Grandmother   . Arthritis Father   . Dementia Father   . Hypertension Father   . Alzheimer's disease Father   . COPD Brother   . Coronary artery disease Neg Hx   . Heart failure Neg Hx    . Heart disease Neg Hx        Objective:    BP 126/76   Pulse 94   Temp 98.6 F (37 C) (Oral)   Resp 16   Ht 6' 1.23" (1.86 m)   Wt (!) 343 lb 6.4 oz (155.8 kg)   SpO2 98%   BMI 45.02 kg/m  Physical Exam  Constitutional: He is oriented to person, place, and time. He appears well-developed and well-nourished. No distress.  HENT:  Head: Normocephalic and atraumatic.  Right Ear: External ear normal.  Left Ear: External ear normal.  Nose: Nose normal.  Mouth/Throat: Oropharynx is clear and moist.  Eyes: Pupils are equal, round, and reactive to light. Conjunctivae and EOM are normal.  Neck: Normal range of motion. Neck supple. Carotid bruit is not present. No thyromegaly present.  Cardiovascular: Normal rate, regular rhythm, normal heart sounds and intact distal pulses. Exam reveals no gallop and no friction rub.  No murmur heard. Pulmonary/Chest: Effort normal and breath sounds normal. He has no wheezes. He has no rales.  Abdominal: Soft. Bowel sounds are normal. He exhibits no distension and no mass. There is no tenderness. There is no rebound and no guarding.  Musculoskeletal:       Right shoulder: Normal.       Left shoulder: Normal.       Cervical back: Normal.  Lymphadenopathy:    He has no cervical adenopathy.  Neurological: He is alert and oriented to person, place, and time. He has normal reflexes. No cranial nerve deficit. He exhibits normal muscle tone. Coordination normal.  Skin: Skin is warm and dry. No rash noted. He is not diaphoretic.  Psychiatric: He has a normal mood and affect. His behavior is normal. Judgment and thought content normal.  Nursing note and vitals reviewed.  No results found. Depression screen [facility] 2/9 11/23/2017 07/15/2017 05/18/2017 11/22/2016 11/11/2016  Decreased Interest 0 0 0 1 0  Down, Depressed, Hopeless 0 0 0 0 0  PHQ - 2 Score 0 0 0 1 0  Altered sleeping - - - 1 -  Tired, decreased energy - - - 1 -  Change in appetite - - - 0 -    Feeling bad or failure about yourself  - - - 0 -  Trouble concentrating - - - 0 -  Moving slowly or fidgety/restless - - - 0 -  Suicidal thoughts - - - 0 -  PHQ-9 Score - - - 3 -   Fall Risk  11/23/2017 07/15/2017 05/18/2017 11/11/2016 08/24/2016  Falls in the past year? No No No No No        Assessment & Plan:   1. Essential hypertension   2. Chronic systolic heart failure (HCC)   3. Pure hypercholesterolemia   4. NICM (nonischemic cardiomyopathy) ([place])   5. OSA (obstructive sleep apnea)   6. Glucose intolerance   7. Morbid obesity ([place])     Well-controlled chronic medical conditions.  Obtain labs for chronic disease management.  No changes to therapy at this time.  Followed by cardiology regularly.  Morbid obesity: Recommend weight loss, exercise for 30-60 minutes five days per week; recommend 1800 kcal restriction per day with a minimum of 60 grams of protein per day.  Eat 3 meals per day. Do not skip meals. Consider having a protein shake as a meal replacement to aid with eliminating meal skipping. Look for products with <220 calories, <7 gm sugar, and 20-30 gm protein.  Eat breakfast within 2 hours of getting up.   Make  your plate non-starchy vegetables,  protein, and  carbohydrates at lunch and dinner.   Aim for at least 64 oz. of calorie-free beverages daily (water, Crystal Light, diet green tea, etc.). Eliminate any sugary beverages such as regular soda, sweet tea, or fruit juice.   Pay attention to hunger and fullness cues.  Stop eating once you feel satisfied; don't wait until you feel full, stuffed, or sick from eating.  Choose lean meats and low fat/fat free dairy products.  Choose foods high in fiber such as fruits, vegetables, and whole grains (brown rice, whole wheat pasta, whole wheat bread, etc.).  Limit foods with added sugar to <7 gm per serving.  Always eat in the kitchen/dining room.  Never eat in the bedroom or in front of the TV.     No orders of  the defined types were placed in this encounter.  No orders of the defined types were placed in this encounter.   Return in about 6 months (around 05/26/2018) for complete physical examiniation STALLINGS.   Elleana Stillson Elayne Guerin, M.D. Primary Care at [facility] [address] [place], [place]  [address] [phone] phone [phone] fax

## 2017-11-23 NOTE — Patient Instructions (Signed)
     IF you received an x-ray today, you will receive an invoice from [facility]. Please contact [facility] at [phone] with questions or concerns regarding your invoice.   IF you received labwork today, you will receive an invoice from [facility]. Please contact [facility] at [phone] with questions or concerns regarding your invoice.   Our billing staff will not be able to assist you with questions regarding bills from these companies.  You will be contacted with the lab results as soon as they are available. The fastest way to get your results is to activate your My Chart account. Instructions are located on the last page of this paperwork. If you have not heard from us regarding the results in 2 weeks, please contact this office.     

## 2017-12-19 ENCOUNTER — Ambulatory Visit (INDEPENDENT_AMBULATORY_CARE_PROVIDER_SITE_OTHER): Payer: BLUE CROSS/BLUE SHIELD

## 2017-12-19 DIAGNOSIS — I5022 Chronic systolic (congestive) heart failure: Secondary | ICD-10-CM

## 2017-12-19 DIAGNOSIS — Z95810 Presence of automatic (implantable) cardiac defibrillator: Secondary | ICD-10-CM

## 2017-12-19 NOTE — Progress Notes (Signed)
EPIC Encounter for ICM Monitoring  Patient Name: Gary Williamson is a 52 y.o. male Date: 12/19/2017 Primary Care Physican: No primary care provider on file. Primary Cardiologist: [name] Electrophysiologist: [name] Dry Weight:unknown Bi-V Pacing: 99%      Attempted call to patient and unable to reach.  Left message to return call regarding transmission.  Transmission reviewed.    Thoracic impedance abnormal suggesting fluid accumulation starting 12/17/2017.  Impedance had multiple episodes suggesting fluid accumulation within last month.   Prescribed dosage: Furosemide 40 mg 1 tablet daily  Labs: 07/15/2017 Creatinine 1.17, BUN 15, Potassium 4.3, Sodium 144, EGFR 71-82 02/01/2017 Creatinine 1.10, BUN 14, Potassium 4.1, Sodium 137 11/22/2016 Creatinine 0.98, BUN 12, Potassium 4.1, Sodium 144, EGFR 88-102 08/24/2016 Creatinine 1.13, BUN 12, Potassium 4.4, Sodium 142  Recommendations:  NONE - Unable to reach.    Follow-up plan: ICM clinic phone appointment on 12/27/2017 to recheck fluid levels.  Was due for an appointment with Dr [name] March 2019.  Recall appointment for 01/2018 with Dr [name].      Copy of ICM check sent to Dr. [name] and Dr [name].   3 month ICM trend: 12/19/2017    1 Year ICM trend:       [name], RN 12/19/2017 9:32 AM

## 2017-12-20 ENCOUNTER — Telehealth: Payer: Self-pay

## 2017-12-20 NOTE — Telephone Encounter (Signed)
Remote ICM transmission received.  Attempted call to patient and left detailed message, per DPR, to return call regarding transmission.    

## 2017-12-21 NOTE — Progress Notes (Signed)
Increase Lasix to 40 qam/20 qpm with BMET in 1 week.

## 2017-12-22 MED ORDER — FUROSEMIDE 40 MG PO TABS: ORAL_TABLET | ORAL | 0 refills | Status: DC

## 2017-12-22 NOTE — Progress Notes (Signed)
Patient returned call.  Reviewed transmission.  No complaints at this time.  Advised Dr [name] increased Furosemide 40 mg to 1 tablet (40 mg total) every morning and 0.5 tablet (20 mg total) every afternoon.  Advised BMET scheduled for 12/30/2017 at HF clinic. His preferred pharmacy is [facility].  He stated he has enough supply on hand for increase in dosage.  Advised he will need to call the pharmacy when script is needed.  Advised he needs to make an office appointment with Dr [name].

## 2017-12-22 NOTE — Progress Notes (Signed)
Attempted call and left message to return call.  

## 2017-12-22 NOTE — Progress Notes (Signed)
Called and left message for patient to call us back.  

## 2017-12-23 ENCOUNTER — Other Ambulatory Visit (HOSPITAL_COMMUNITY): Payer: BLUE CROSS/BLUE SHIELD

## 2017-12-28 NOTE — Progress Notes (Signed)
No ICM remote transmission received for 12/27/2017 and next ICM transmission scheduled for 01/26/2018.

## 2017-12-30 ENCOUNTER — Ambulatory Visit (HOSPITAL_COMMUNITY)
Admission: RE | Admit: 2017-12-30 | Discharge: 2017-12-30 | Disposition: A | Discharge status: Home / Self Care | Payer: BLUE CROSS/BLUE SHIELD | Source: Ambulatory Visit | Attending: Cardiology | Admitting: Cardiology

## 2017-12-30 DIAGNOSIS — I5022 Chronic systolic (congestive) heart failure: Secondary | ICD-10-CM | POA: Insufficient documentation

## 2017-12-30 LAB — BASIC METABOLIC PANEL
ANION GAP: 8 (ref 5–15)
BUN: 15 mg/dL (ref 6–20)
CALCIUM: 9 mg/dL (ref 8.9–10.3)
CO2: 27 mmol/L (ref 22–32)
Chloride: 105 mmol/L (ref 98–111)
Creatinine, Ser: 1.16 mg/dL (ref 0.61–1.24)
GFR calc Af Amer: >60 mL/min (ref >60)
GFR calc non Af Amer: >60 mL/min (ref >60)
GLUCOSE: 130 mg/dL — AB (ref 70–99)
Potassium: 3.7 mmol/L (ref 3.5–5.1)
Sodium: 140 mmol/L (ref 135–145)

## 2018-01-25 ENCOUNTER — Ambulatory Visit: Payer: BLUE CROSS/BLUE SHIELD | Admitting: Family Medicine

## 2018-01-26 ENCOUNTER — Ambulatory Visit (INDEPENDENT_AMBULATORY_CARE_PROVIDER_SITE_OTHER): Payer: BLUE CROSS/BLUE SHIELD | Admitting: *Deleted

## 2018-01-26 ENCOUNTER — Ambulatory Visit (INDEPENDENT_AMBULATORY_CARE_PROVIDER_SITE_OTHER): Payer: BLUE CROSS/BLUE SHIELD

## 2018-01-26 DIAGNOSIS — I5022 Chronic systolic (congestive) heart failure: Secondary | ICD-10-CM | POA: Diagnosis not present

## 2018-01-26 DIAGNOSIS — I428 Other cardiomyopathies: Secondary | ICD-10-CM | POA: Diagnosis not present

## 2018-01-26 DIAGNOSIS — Z95810 Presence of automatic (implantable) cardiac defibrillator: Secondary | ICD-10-CM

## 2018-01-26 NOTE — Progress Notes (Signed)
Remote ICD transmission.   

## 2018-01-27 ENCOUNTER — Encounter: Payer: Self-pay | Admitting: Cardiology

## 2018-01-27 NOTE — Progress Notes (Signed)
EPIC Encounter for ICM Monitoring  Patient Name: Gary Williamson is a 53 y.o. male Date: 01/27/2018 Primary Care Physican: [name], MD Primary Cardiologist: [name] Electrophysiologist: [name] Dry Weight:unknown Bi-V Pacing: 99%       Attempted call to patient and unable to reach.  Left detailed message, per DPR, regarding transmission.  Transmission reviewed.    Thoracic impedance normal but does show a pattern of valleys and peaks in daily impedance line.   Prescribed: Furosemide 40 mg 1 tablet every morning and 0.5 tablet (20 mg total) every afternoon.  Labs: 12/30/2017 Creatinine 1.16, BUN 15, Potassium 3.7, Sodium 140, EGFR >60 07/15/2017 Creatinine 1.17, BUN 15, Potassium 4.3, Sodium 144, EGFR 71-82 02/01/2017 Creatinine 1.10, BUN 14, Potassium 4.1, Sodium 137 11/22/2016 Creatinine 0.98, BUN 12, Potassium 4.1, Sodium 144, EGFR 88-102 08/24/2016 Creatinine 1.13, BUN 12, Potassium 4.4, Sodium 142  Recommendations: Left voice mail with ICM number and encouraged to call if experiencing any fluid symptoms.  Follow-up plan: ICM clinic phone appointment on 02/27/2018.   Advised to schedule appointment with Dr [name] (last visit 02/01/2017)    Copy of ICM check sent to Dr. [name].   3 month ICM trend: 01/26/2018    1 Year ICM trend:       [name], RN 01/27/2018 11:22 AM

## 2018-02-27 ENCOUNTER — Ambulatory Visit (INDEPENDENT_AMBULATORY_CARE_PROVIDER_SITE_OTHER): Payer: BLUE CROSS/BLUE SHIELD

## 2018-02-27 DIAGNOSIS — Z95810 Presence of automatic (implantable) cardiac defibrillator: Secondary | ICD-10-CM | POA: Diagnosis not present

## 2018-02-27 DIAGNOSIS — I5022 Chronic systolic (congestive) heart failure: Secondary | ICD-10-CM

## 2018-02-28 NOTE — Progress Notes (Signed)
EPIC Encounter for ICM Monitoring  Patient Name: Gary Williamson is a 52 y.o. male Date: 02/28/2018 Primary Care Physican: [name], MD Primary Cardiologist: [name] Electrophysiologist: [name] Dry Weight:unknown Bi-V Pacing: 99%        Attempted call to patient and unable to reach.  Left detailed message, per DPR, regarding transmission.  Transmission reviewed.    Thoracic impedance normal.   Prescribed: Furosemide 40 mg 1 tablet every morning and 0.5 tablet (20 mg total) every afternoon.  Labs: 12/30/2017 Creatinine 1.16, BUN 15, Potassium 3.7, Sodium 140, EGFR >60 07/15/2017 Creatinine 1.17, BUN 15, Potassium 4.3, Sodium 144, EGFR 71-82 02/01/2017 Creatinine 1.10, BUN 14, Potassium 4.1, Sodium 137 11/22/2016 Creatinine 0.98, BUN 12, Potassium 4.1, Sodium 144, EGFR 88-102 08/24/2016 Creatinine 1.13, BUN 12, Potassium 4.4, Sodium 142  Recommendations: Left voice mail with ICM number and encouraged to call if experiencing any fluid symptoms.  Follow-up plan: ICM clinic phone appointment on 04/03/2018.    Copy of ICM check sent to Dr. [name].   3 month ICM trend: 02/28/2018    1 Year ICM trend:       [name], RN 02/28/2018 9:50 AM

## 2018-03-01 ENCOUNTER — Telehealth: Payer: Self-pay

## 2018-03-01 NOTE — Telephone Encounter (Signed)
Remote ICM transmission received.  Attempted call to patient regarding ICM remote transmission and left detailed message, per DPR, with next ICM remote transmission date of 04/03/2018.  Advised to return call for any fluid symptoms or questions.

## 2018-03-08 LAB — CUP PACEART REMOTE DEVICE CHECK
Battery Remaining Longevity: 35 mo
Battery Voltage: 2.92 V
Brady Statistic AP VS Percent: 1 %
Brady Statistic AS VS Percent: 1 %
Brady Statistic RA Percent Paced: 2.6 %
Date Time Interrogation Session: 20190926093036
HIGH POWER IMPEDANCE MEASURED VALUE: 65 Ohm
HighPow Impedance: 65 Ohm
Implantable Lead Implant Date: 20150421
Implantable Lead Implant Date: 20150421
Implantable Lead Implant Date: 20150421
Implantable Lead Location: 753857
Implantable Lead Location: 753859
Implantable Pulse Generator Implant Date: 20150421
Lead Channel Impedance Value: 360 Ohm
Lead Channel Pacing Threshold Amplitude: 0.625 V
Lead Channel Pacing Threshold Amplitude: 0.75 V
Lead Channel Pacing Threshold Amplitude: 0.75 V
Lead Channel Pacing Threshold Pulse Width: 0.5 ms
Lead Channel Sensing Intrinsic Amplitude: 4.3 mV
Lead Channel Setting Pacing Amplitude: 2 V
Lead Channel Setting Pacing Amplitude: 2 V
Lead Channel Setting Pacing Pulse Width: 0.5 ms
Lead Channel Setting Pacing Pulse Width: 0.5 ms
Lead Channel Setting Sensing Sensitivity: 0.5 mV
MDC IDC LEAD LOCATION: 753860
MDC IDC MSMT BATTERY REMAINING PERCENTAGE: 40 %
MDC IDC MSMT LEADCHNL LV IMPEDANCE VALUE: 850 Ohm
MDC IDC MSMT LEADCHNL LV PACING THRESHOLD PULSEWIDTH: 0.5 ms
MDC IDC MSMT LEADCHNL RV IMPEDANCE VALUE: 480 Ohm
MDC IDC MSMT LEADCHNL RV PACING THRESHOLD PULSEWIDTH: 0.5 ms
MDC IDC MSMT LEADCHNL RV SENSING INTR AMPL: 8.1 mV
MDC IDC SET LEADCHNL RA PACING AMPLITUDE: 2 V
MDC IDC STAT BRADY AP VP PERCENT: 2.8 %
MDC IDC STAT BRADY AS VP PERCENT: 96 %
Pulse Gen Serial Number: 7157119

## 2018-04-03 ENCOUNTER — Ambulatory Visit (INDEPENDENT_AMBULATORY_CARE_PROVIDER_SITE_OTHER): Payer: BLUE CROSS/BLUE SHIELD

## 2018-04-03 DIAGNOSIS — Z95810 Presence of automatic (implantable) cardiac defibrillator: Secondary | ICD-10-CM

## 2018-04-03 DIAGNOSIS — I5022 Chronic systolic (congestive) heart failure: Secondary | ICD-10-CM | POA: Diagnosis not present

## 2018-04-04 ENCOUNTER — Telehealth: Payer: Self-pay

## 2018-04-04 ENCOUNTER — Telehealth: Payer: Self-pay | Admitting: Family Medicine

## 2018-04-04 NOTE — Telephone Encounter (Signed)
Remote ICM transmission received.  Attempted call to patient regarding ICM remote transmission and left detailed message, per DPR, with next ICM remote transmission date of 05/04/2018.  Advised to return call for any fluid symptoms or questions.    

## 2018-04-04 NOTE — Progress Notes (Signed)
EPIC Encounter for ICM Monitoring  Patient Name: Gary Williamson is a 52 y.o. male Date: 04/04/2018 Primary Care Physican: [name], MD Primary Cardiologist: [name] Electrophysiologist: [name] Bi-V Pacing: 99%  Today's Weight:  unknown       Attempted call to patient and unable to reach.  Left detailed message, per DPR, regarding transmission.  Transmission reviewed.    Thoracic impedance abnormal suggesting fluid accumulation starting ~03/22/2018 and close to baseline 04/03/2018.   Prescribed: Furosemide 40 mg 1 tabletevery morning and 0.5 tablet (20 mg total) every afternoon.  Labs: 12/30/2017 Creatinine 1.16, BUN 15, Potassium 3.7, Sodium 140, EGFR >60 07/15/2017 Creatinine 1.17, BUN 15, Potassium 4.3, Sodium 144, EGFR 71-82 02/01/2017 Creatinine 1.10, BUN 14, Potassium 4.1, Sodium 137 11/22/2016 Creatinine 0.98, BUN 12, Potassium 4.1, Sodium 144, EGFR 88-102 08/24/2016 Creatinine 1.13, BUN 12, Potassium 4.4, Sodium 142  Recommendations:  Left voice mail with ICM number and encouraged to call if experiencing any fluid symptoms.  Follow-up plan: ICM clinic phone appointment on 05/04/2018.      Copy of ICM check sent to Dr. [name] and Dr [name].   3 month ICM trend: 04/03/2018    1 Year ICM trend:       [name], RN 04/04/2018 10:42 AM

## 2018-04-04 NOTE — Telephone Encounter (Signed)
LVM for pt to call the office and reschedule his appt on 06/05/18 due to Dr. Nolon Rod schedule change. Please reschedule for a CPE when pt calls back. Thank you!

## 2018-05-04 ENCOUNTER — Ambulatory Visit: Payer: BLUE CROSS/BLUE SHIELD

## 2018-05-05 ENCOUNTER — Other Ambulatory Visit (HOSPITAL_COMMUNITY): Payer: Self-pay

## 2018-05-05 ENCOUNTER — Encounter: Payer: Self-pay | Admitting: Cardiology

## 2018-05-05 DIAGNOSIS — I5022 Chronic systolic (congestive) heart failure: Secondary | ICD-10-CM

## 2018-05-05 MED ORDER — SPIRONOLACTONE 25 MG PO TABS: ORAL_TABLET | ORAL | 1 refills | Status: DC

## 2018-05-05 NOTE — Progress Notes (Signed)
No ICM remote transmission received for 05/04/2018 and next ICM transmission scheduled for 06/01/2018.   

## 2018-05-07 LAB — CUP PACEART REMOTE DEVICE CHECK
Battery Remaining Longevity: 31 mo
Battery Remaining Percentage: 36 %
Brady Statistic AS VS Percent: 1 %
Date Time Interrogation Session: 20200104102550
HIGH POWER IMPEDANCE MEASURED VALUE: 70 Ohm
HighPow Impedance: 70 Ohm
Implantable Lead Implant Date: 20150421
Implantable Lead Location: 753859
Implantable Lead Location: 753860
Implantable Pulse Generator Implant Date: 20150421
Lead Channel Impedance Value: 360 Ohm
Lead Channel Impedance Value: 480 Ohm
Lead Channel Impedance Value: 860 Ohm
Lead Channel Pacing Threshold Amplitude: 0.625 V
Lead Channel Pacing Threshold Amplitude: 0.75 V
Lead Channel Pacing Threshold Pulse Width: 0.5 ms
Lead Channel Pacing Threshold Pulse Width: 0.5 ms
Lead Channel Pacing Threshold Pulse Width: 0.5 ms
Lead Channel Sensing Intrinsic Amplitude: 4.6 mV
Lead Channel Setting Pacing Amplitude: 2 V
Lead Channel Setting Pacing Amplitude: 2 V
Lead Channel Setting Pacing Pulse Width: 0.5 ms
Lead Channel Setting Sensing Sensitivity: 0.5 mV
MDC IDC LEAD IMPLANT DT: 20150421
MDC IDC LEAD IMPLANT DT: 20150421
MDC IDC LEAD LOCATION: 753857
MDC IDC MSMT BATTERY VOLTAGE: 2.9 V
MDC IDC MSMT LEADCHNL RV PACING THRESHOLD AMPLITUDE: 0.75 V
MDC IDC MSMT LEADCHNL RV SENSING INTR AMPL: 7.3 mV
MDC IDC SET LEADCHNL LV PACING PULSEWIDTH: 0.5 ms
MDC IDC SET LEADCHNL RV PACING AMPLITUDE: 2 V
MDC IDC STAT BRADY AP VP PERCENT: 2.4 %
MDC IDC STAT BRADY AP VS PERCENT: 1 %
MDC IDC STAT BRADY AS VP PERCENT: 97 %
MDC IDC STAT BRADY RA PERCENT PACED: 2.2 %
Pulse Gen Serial Number: 7157119

## 2018-05-11 ENCOUNTER — Ambulatory Visit (INDEPENDENT_AMBULATORY_CARE_PROVIDER_SITE_OTHER): Payer: BLUE CROSS/BLUE SHIELD

## 2018-05-11 DIAGNOSIS — I428 Other cardiomyopathies: Secondary | ICD-10-CM

## 2018-05-11 DIAGNOSIS — I5022 Chronic systolic (congestive) heart failure: Secondary | ICD-10-CM

## 2018-05-12 NOTE — Progress Notes (Signed)
Remote ICD transmission.   

## 2018-05-31 ENCOUNTER — Ambulatory Visit: Payer: BLUE CROSS/BLUE SHIELD | Admitting: Emergency Medicine

## 2018-05-31 ENCOUNTER — Encounter: Payer: Self-pay | Admitting: Emergency Medicine

## 2018-05-31 ENCOUNTER — Other Ambulatory Visit: Payer: Self-pay

## 2018-05-31 VITALS — BP 140/79 | HR 80 | Temp 98.0°F | Resp 16 | Ht 73.0 in | Wt 356.0 lb

## 2018-05-31 DIAGNOSIS — M79674 Pain in right toe(s): Secondary | ICD-10-CM | POA: Diagnosis not present

## 2018-05-31 DIAGNOSIS — M109 Gout, unspecified: Secondary | ICD-10-CM | POA: Diagnosis not present

## 2018-05-31 MED ORDER — TRAMADOL HCL 50 MG PO TABS: 50.0000 mg | ORAL_TABLET | Freq: Three times a day (TID) | ORAL | 0 refills | Status: DC | PRN

## 2018-05-31 MED ORDER — COLCHICINE 0.6 MG PO TABS: ORAL_TABLET | ORAL | 1 refills | Status: DC

## 2018-05-31 MED ORDER — PREDNISONE 20 MG PO TABS: 20.0000 mg | ORAL_TABLET | Freq: Every day | ORAL | 0 refills | Status: AC

## 2018-05-31 MED ORDER — CEFADROXIL 500 MG PO CAPS: 500.0000 mg | ORAL_CAPSULE | Freq: Two times a day (BID) | ORAL | 0 refills | Status: AC

## 2018-05-31 NOTE — Patient Instructions (Signed)

## 2018-05-31 NOTE — Progress Notes (Signed)
Gary Williamson 53 y.o.   Chief Complaint  Patient presents with  . Toe Pain    x 2 days/ pt states is a throbbing feeling/ right big toe    HISTORY OF PRESENT ILLNESS: This is a 53 y.o. male complaining of pain to his right big toe that started 2 to 3 days ago.  Denies injury.  No other significant symptoms.  HPI   Prior to Admission medications   Medication Sig Start Date End Date Taking? Authorizing Provider  aspirin 81 MG EC tablet Take 1 tablet (81 mg total) by mouth daily. 12/22/10  Yes Larey Dresser, MD  carvedilol (COREG) 25 MG tablet TAKE 1 TABLET BY MOUTH TWICE A DAY 07/15/17  Yes Wardell Honour, MD  Cholecalciferol (VITAMIN D) 2000 units CAPS Take by mouth daily.   Yes [provider]  ENTRESTO 97-103 MG TAKE ONE TABLET BY MOUTH TWICE DAILY 06/20/17  Yes Larey Dresser, MD  fish oil-omega-3 fatty acids 1000 MG capsule Take 1 capsule (1 g total) by mouth daily. 12/22/10  Yes Larey Dresser, MD  furosemide (LASIX) 40 MG tablet Take 1 tablet (40 mg total) every morning and 0.5 tablet (20 mg total) every afternoon. Office visit needed 12/22/17  Yes Larey Dresser, MD  Multiple Vitamin (MULTIVITAMIN) tablet Take 1 tablet by mouth daily.     Yes [provider]  simvastatin (ZOCOR) 40 MG tablet Take 1 tablet (40 mg total) by mouth every evening. 07/15/17 07/15/18 Yes Wardell Honour, MD  spironolactone (ALDACTONE) 25 MG tablet TAKE 1 TABLET BY MOUTH DAILY. 05/05/18  Yes Larey Dresser, MD    No Known Allergies  Patient Active Problem List   Diagnosis Date Noted  . Class 3 severe obesity with serious comorbidity and body mass index (BMI) of 40.0 to 44.9 in adult ([place]) 12/21/2016  . Essential hypertension 11/22/2016  . Benign neoplasm of cecum   . Benign neoplasm of sigmoid colon   . Hematuria, microscopic 05/15/2013  . NICM (nonischemic cardiomyopathy) ([place]) 04/09/2013  . OSA (obstructive sleep apnea) 01/18/2013  . CARDIOVASCULAR FUNCTION STUDY,  ABNORMAL 11/04/2009  . Chronic systolic heart failure ([place]) 10/14/2009  . Vitamin D deficiency 05/02/2009  . HLD (hyperlipidemia) 03/05/2009    Past Medical History:  Diagnosis Date  . Acute idiopathic pericarditis 03/24/2010   Overview:  Viral Idiopathic Pericarditis   . Chronic systolic heart failure ([place])   . Hypertension   . LBBB (left bundle branch block)    Chronic  . Leg edema   . NICM (nonischemic cardiomyopathy) ([place])    Probably nonischemic; LHC in 2002/03 no sig CAD per report (done in [place]). Cardiomyopathy for 9-10 yrs. 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 Echo 5/11: severe LVE, EF <20%, diff HK, mod dias dys, sev. LAE. Hosp for CHF in 5/11. Ad. MV 6/11: EF 21%, mild per-infart ischemia. LHC (7/11): EF 15%, no CAD, LVEDP 22; Echo (10/11): EF 20-25%;  Echo (12/12):  EF 20%, mod LVH, Gr 1 DD, diff HK, mild LAE  . OSA (obstructive sleep apnea)    cpap used  . Pure hypercholesterolemia   . Severe obesity ([place])   . Vitamin D deficiency     Past Surgical History:  Procedure Laterality Date  . BI-VENTRICULAR IMPLANTABLE CARDIOVERTER DEFIBRILLATOR N/A 08/21/2013   Procedure: BI-VENTRICULAR IMPLANTABLE CARDIOVERTER DEFIBRILLATOR  (CRT-D);  Surgeon: Coralyn Mark, MD;  Location: [facility] CATH LAB;  Service: Cardiovascular;  Laterality: N/A;  . CARDIAC CATHETERIZATION  2002  or 2003  . COLONOSCOPY N/A 09/16/2014   Procedure: COLONOSCOPY;  Surgeon: [name], MD;  Location: [facility] ENDOSCOPY;  Service: Endoscopy;  Laterality: N/A;  . LEFT HEART CATH  11/13/09   Dr [name]    Social History   Socioeconomic History  . Marital status: Married    Spouse name: [name]  . Number of children: 2  . Years of education: Not on file  . Highest education level: Not on file  Occupational History  . 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 Financial resource strain: Not on file  . Food insecurity:     Worry: Not on file    Inability: Not on file  . Transportation needs:    Medical: Not on file    Non-medical: Not on file  Tobacco Use  . Smoking status: Never Smoker  . Smokeless tobacco: Never Used  Substance and Sexual Activity  . Alcohol use: Yes    Alcohol/week: 0.0 - 2.0 standard drinks    Comment: rare to occ.  . Drug use: No  . Sexual activity: Yes    Birth control/protection: Condom  Lifestyle  . Physical activity:    Days per week: Not on file    Minutes per session: Not on file  . Stress: Not on file  Relationships  . Social connections:    Talks on phone: Not on file    Gets together: Not on file    Attends religious service: Not on file    Active member of club or organization: Not on file    Attends meetings of clubs or organizations: Not on file    Relationship status: Not on file  . Intimate partner violence:    Fear of current or ex partner: Not on file    Emotionally abused: Not on file    Physically abused: Not on file    Forced sexual activity: Not on file  Other Topics Concern  . Not on file  Social History Narrative   Marital status: married x 17 years; happily married      Children: 2 children (15,12)      Lives in [place] with his wife and 2 children      Employment: [employer]; Solicitor x 12 years; likes work.  Lots of traveling.       Tobacco: none      Alcohol:  1 drink per week      Exercise:  Walking three days per week; 20 minutes.       Seatbelt: 100%    Family History  Problem Relation Age of Onset  . Hypertension Mother   . Alzheimer's disease Mother   . Hyperlipidemia Mother   . Breast cancer Mother   . Cancer Mother        breast cancer  . Stroke Maternal Grandmother   . Arthritis Father   . Dementia Father   . Hypertension Father   . Alzheimer's disease Father   . COPD Brother   . Coronary artery disease Neg Hx   . Heart failure Neg Hx   . Heart disease Neg Hx      Review of Systems  Constitutional: Negative.   Negative for chills and fever.  Respiratory: Negative for cough and shortness of breath.   Cardiovascular: Negative for chest pain and palpitations.  Gastrointestinal: Negative for nausea and vomiting.  Musculoskeletal:  Toe pain  Neurological: Negative for dizziness and headaches.  Endo/Heme/Allergies: Negative.     Vitals:   05/31/18 1038  BP: 140/79  Pulse: 80  Resp: 16  Temp: 98 F (36.7 C)  SpO2: 98%    Physical Exam Vitals signs reviewed.  Constitutional:      Appearance: He is obese.  HENT:     Head: Normocephalic and atraumatic.     Mouth/Throat:     Mouth: Mucous membranes are moist.  Eyes:     Extraocular Movements: Extraocular movements intact.     Pupils: Pupils are equal, round, and reactive to light.  Neck:     Musculoskeletal: Normal range of motion.  Cardiovascular:     Rate and Rhythm: Normal rate.     Heart sounds: Normal heart sounds.  Pulmonary:     Breath sounds: Normal breath sounds.  Musculoskeletal: Normal range of motion.     Comments: Right big toe: Positive erythema with swelling and tenderness to palpation with limited range of motion due to pain.  NVI.  Nail intact.  Findings compatible with acute gout  Skin:    General: Skin is warm and dry.     Capillary Refill: Capillary refill takes less than 2 seconds.  Neurological:     General: No focal deficit present.     Mental Status: He is alert and oriented to person, place, and time.        ASSESSMENT & PLAN: Dilraj was seen today for toe pain.  Diagnoses and all orders for this visit:  Pain of right great toe Comments: Possible cellulitis   Acute gouty arthritis  Other orders -     colchicine 0.6 MG tablet; Take 1.2 mg now and 0.6 mg one hour later. Take 0.6 mg daily after that x 5 days. -     traMADol (ULTRAM) 50 MG tablet; Take 1 tablet (50 mg total) by mouth every 8 (eight) hours as needed. -     predniSONE (DELTASONE) 20 MG tablet; Take 1 tablet (20 mg total) by  mouth daily with breakfast for 5 days. -     cefadroxil (DURICEF) 500 MG capsule; Take 1 capsule (500 mg total) by mouth 2 (two) times daily for 7 days.    Patient Instructions  Gout  Gout is painful swelling of your joints. Gout is a type of arthritis. It is caused by having too much uric acid in your body. Uric acid is a chemical that is made when your body breaks down substances called purines. If your body has too much uric acid, sharp crystals can form and build up in your joints. This causes pain and swelling. Gout attacks can happen quickly and be very painful (acute gout). Over time, the attacks can affect more joints and happen more often (chronic gout). What are the causes?  Too much uric acid in your blood. This can happen because: ? Your kidneys do not remove enough uric acid from your blood. ? Your body makes too much uric acid. ? You eat too many foods that are high in purines. These foods include organ meats, some seafood, and beer.  Trauma or stress. What increases the risk?  Having a family history of gout.  Being male and middle-aged.  Being male and having gone through menopause.  Being very overweight (obese).  Drinking alcohol, especially beer.  Not having enough water in the body (being dehydrated).  Losing weight too quickly.  Having an organ transplant.  Having lead poisoning.  Taking certain medicines.  Having kidney disease.  Having a skin condition called psoriasis. What are the signs or symptoms? An attack of acute gout usually happens in just one joint. The most common place is the big toe. Attacks often start at night. Other joints that may be affected include joints of the feet, ankle, knee, fingers, wrist, or elbow. Symptoms of an attack may include:  Very bad pain.  Warmth.  Swelling.  Stiffness.  Shiny, red, or purple skin.  Tenderness. The affected joint may be very painful to touch.  Chills and fever. Chronic gout may  cause symptoms more often. More joints may be involved. You may also have white or yellow lumps (tophi) on your hands or feet or in other areas near your joints. How is this treated?  Treatment for this condition has two phases: treating an acute attack and preventing future attacks.  Acute gout treatment may include: ? NSAIDs. ? Steroids. These are taken by mouth or injected into a joint. ? Colchicine. This medicine relieves pain and swelling. It can be given by mouth or through an IV tube.  Preventive treatment may include: ? Taking small doses of NSAIDs or colchicine daily. ? Using a medicine that reduces uric acid levels in your blood. ? Making changes to your diet. You may need to see a food expert (dietitian) about what to eat and drink to prevent gout. Follow these instructions at home: During a gout attack   If told, put ice on the painful area: ? Put ice in a plastic bag. ? Place a towel between your skin and the bag. ? Leave the ice on for 20 minutes, 2-3 times a day.  Raise (elevate) the painful joint above the level of your heart as often as you can.  Rest the joint as much as possible. If the joint is in your leg, you may be given crutches.  Follow instructions from your doctor about what you cannot eat or drink. Avoiding future gout attacks  Eat a low-purine diet. Avoid foods and drinks such as: ? Liver. ? Kidney. ? Anchovies. ? Asparagus. ? Herring. ? Mushrooms. ? Mussels. ? Beer.  Stay at a healthy weight. If you want to lose weight, talk with your doctor. Do not lose weight too fast.  Start or continue an exercise plan as told by your doctor. Eating and drinking  Drink enough fluids to keep your pee (urine) pale yellow.  If you drink alcohol: ? Limit how much you use to:  0-1 drink a day for women.  0-2 drinks a day for men. ? Be aware of how much alcohol is in your drink. In the [place] one drink equals one 12 oz bottle of beer (355 mL), one 5 oz  glass of wine (148 mL), or one 1 oz glass of hard liquor (44 mL). General instructions  Take over-the-counter and prescription medicines only as told by your doctor.  Do not drive or use heavy machinery while taking prescription pain medicine.  Return to your normal activities as told by your doctor. Ask your doctor what activities are safe for you.  Keep all follow-up visits as told by your doctor. This is important. Contact a doctor if:  You have another gout attack.  You still have symptoms of a gout attack after 10 days of treatment.  You have problems (side effects) because of your medicines.  You have chills or a fever.  You have burning pain when you pee (urinate).  You  have pain in your lower back or belly. Get help right away if:  You have very bad pain.  Your pain cannot be controlled.  You cannot pee. Summary  Gout is painful swelling of the joints.  The most common site of pain is the big toe, but it can affect other joints.  Medicines and avoiding some foods can help to prevent and treat gout attacks. This information is not intended to replace advice given to you by your health care provider. Make sure you discuss any questions you have with your health care provider. Document Released: 01/27/2008 Document Revised: 11/09/2017 Document Reviewed: 11/09/2017 Elsevier Interactive Patient Education  2019 Elsevier Inc.      Agustina Caroli, MD [facility]

## 2018-06-01 ENCOUNTER — Encounter: Payer: BLUE CROSS/BLUE SHIELD | Admitting: Family Medicine

## 2018-06-01 ENCOUNTER — Ambulatory Visit (INDEPENDENT_AMBULATORY_CARE_PROVIDER_SITE_OTHER): Payer: BLUE CROSS/BLUE SHIELD

## 2018-06-01 DIAGNOSIS — I5022 Chronic systolic (congestive) heart failure: Secondary | ICD-10-CM | POA: Diagnosis not present

## 2018-06-01 DIAGNOSIS — Z95810 Presence of automatic (implantable) cardiac defibrillator: Secondary | ICD-10-CM

## 2018-06-02 ENCOUNTER — Telehealth: Payer: Self-pay

## 2018-06-02 NOTE — Telephone Encounter (Signed)
Remote ICM transmission received.  Attempted call to patient regarding ICM remote transmission and left detailed message, per DPR, with next ICM remote transmission date of 07/04/2018.  Advised to return call for any fluid symptoms or questions.    

## 2018-06-02 NOTE — Progress Notes (Signed)
EPIC Encounter for ICM Monitoring  Patient Name: Gary Williamson is a 53 y.o. male Date: 06/02/2018 Primary Care Physican: [name], MD Primary Cardiologist: [name] Electrophysiologist: [name] Bi-V Pacing: >99% Today's Weight:  unknown                                                   Attempted call to patient and unable to reach.  Left detailed message, per DPR, regarding transmission.  Transmission reviewed.    Thoracic impedance normal.  Prescribed: Furosemide 40 mg 1 tabletevery morning and 0.5 tablet (20 mg total) every afternoon.  Labs: 12/30/2017 Creatinine 1.16, BUN 15, Potassium 3.7, Sodium 140, EGFR >60 07/15/2017 Creatinine 1.17, BUN 15, Potassium 4.3, Sodium 144, EGFR 71-82 02/01/2017 Creatinine 1.10, BUN 14, Potassium 4.1, Sodium 137 11/22/2016 Creatinine 0.98, BUN 12, Potassium 4.1, Sodium 144, EGFR 88-102 08/24/2016 Creatinine 1.13, BUN 12, Potassium 4.4, Sodium 142  Recommendations:  Left voice mail with ICM number and encouraged to call if experiencing any fluid symptoms.  Follow-up plan: ICM clinic phone appointment on 07/04/2018.    Last visit with Dr [name] was 01/24/2017. Last visit with Dr [name] was 02/01/2017  Copy of ICM check sent to Dr. [name].  3 month ICM trend: 06/01/2018   1 Year ICM trend:       [name], RN 06/02/2018 9:19 AM

## 2018-06-05 ENCOUNTER — Encounter: Payer: BLUE CROSS/BLUE SHIELD | Admitting: Family Medicine

## 2018-07-01 ENCOUNTER — Telehealth: Payer: Self-pay | Admitting: Family Medicine

## 2018-07-01 NOTE — Telephone Encounter (Signed)
Patient needs his furosemide refilled. He stated the pharmacy has sent over request for this but I did not see any in his chart.  He uses [facility] order  His call back number is [phone]

## 2018-07-04 ENCOUNTER — Ambulatory Visit (INDEPENDENT_AMBULATORY_CARE_PROVIDER_SITE_OTHER): Payer: BLUE CROSS/BLUE SHIELD

## 2018-07-04 ENCOUNTER — Other Ambulatory Visit: Payer: Self-pay | Admitting: *Deleted

## 2018-07-04 DIAGNOSIS — I5022 Chronic systolic (congestive) heart failure: Secondary | ICD-10-CM | POA: Diagnosis not present

## 2018-07-04 DIAGNOSIS — Z95810 Presence of automatic (implantable) cardiac defibrillator: Secondary | ICD-10-CM | POA: Diagnosis not present

## 2018-07-04 MED ORDER — FUROSEMIDE 40 MG PO TABS: ORAL_TABLET | ORAL | 0 refills | Status: DC

## 2018-07-04 NOTE — Telephone Encounter (Signed)
Prescription sent

## 2018-07-05 NOTE — Progress Notes (Signed)
EPIC Encounter for ICM Monitoring  Patient Name: Gary Williamson is a 53 y.o. male Date: 07/05/2018 Primary Care Physican: [name], MD Primary Cardiologist: [name] Electrophysiologist: [name] Bi-V Pacing: >99% Today's Weight: unknown   Heart failure questions and denied any fluid symptoms.  Decreased impedance is due to he was out of Furosemide x 1 week and just got a refill today.  Advised he needs to make appts with Dr [name], Dr [name] and Dr Nolon Rod since on the refill script it says he needs appt.   Thoracic impedance abnormal from 2/25 until 3/3 which is close to baseline normal.  Prescribed:Furosemide 40 mg 1 tabletevery morning and 0.5 tablet (20 mg total) every afternoon.  Labs: 12/30/2017 Creatinine 1.16, BUN 15, Potassium 3.7, Sodium 140, EGFR >60 07/15/2017 Creatinine 1.17, BUN 15, Potassium 4.3, Sodium 144, EGFR 71-82 02/01/2017 Creatinine 1.10, BUN 14, Potassium 4.1, Sodium 137 11/22/2016 Creatinine 0.98, BUN 12, Potassium 4.1, Sodium 144, EGFR 88-102 08/24/2016 Creatinine 1.13, BUN 12, Potassium 4.4, Sodium 142  Recommendations: No changes and advised to restart Furosemide as soon as he picks up the Furosemide refill.  Advised to call for fluid symptoms.  Advised to schedule appointments.  Follow-up plan: ICM clinic phone appointment on4/12/2018. Last visit with Dr [name] was 01/24/2017. Last visit with Dr [name] was 02/01/2017  Copy of ICM check sent to Dr.[name].   3 month ICM trend: 07/04/2018    1 Year ICM trend:       [name], RN 07/05/2018 10:13 AM

## 2018-07-06 ENCOUNTER — Telehealth: Payer: Self-pay | Admitting: Family Medicine

## 2018-07-06 ENCOUNTER — Other Ambulatory Visit: Payer: Self-pay | Admitting: *Deleted

## 2018-07-06 DIAGNOSIS — I5022 Chronic systolic (congestive) heart failure: Secondary | ICD-10-CM

## 2018-07-06 MED ORDER — FUROSEMIDE 40 MG PO TABS: ORAL_TABLET | ORAL | 0 refills | Status: DC

## 2018-07-06 NOTE — Telephone Encounter (Signed)
Patient notified Rx has been re sent to pharmacy for him.

## 2018-07-06 NOTE — Telephone Encounter (Signed)
Copied from [place] [phone]. Topic: Quick Communication - Rx Refill/Question >> Jul 06, 2018  8:36 AM Virl Axe D wrote: Medication: furosemide (LASIX) 40 MG tablet / Pt stated he was told that rx was sent to pharmacy on 07/04/18. He went by last night and was told nothing was there. Pt would like to speak with nurse regarding rx/refill. Please advise. CB#[phone]  Has the patient contacted their pharmacy? Yes.   (Agent: If no, request that the patient contact the pharmacy for the refill.) (Agent: If yes, when and what did the pharmacy advise?)  Preferred Pharmacy (with phone number or street name): [place], [place] [phone] (Phone) [phone] (Fax)  Agent: Please be advised that RX refills may take up to 3 business days. We ask that you follow-up with your pharmacy.

## 2018-07-06 NOTE — Progress Notes (Signed)
Rx was no print- changed to normal.

## 2018-07-22 ENCOUNTER — Other Ambulatory Visit: Payer: Self-pay | Admitting: *Deleted

## 2018-07-22 DIAGNOSIS — Z131 Encounter for screening for diabetes mellitus: Secondary | ICD-10-CM

## 2018-07-22 DIAGNOSIS — I10 Essential (primary) hypertension: Secondary | ICD-10-CM

## 2018-07-22 DIAGNOSIS — Z0000 Encounter for general adult medical examination without abnormal findings: Secondary | ICD-10-CM

## 2018-07-22 DIAGNOSIS — E7800 Pure hypercholesterolemia, unspecified: Secondary | ICD-10-CM

## 2018-07-22 DIAGNOSIS — Z125 Encounter for screening for malignant neoplasm of prostate: Secondary | ICD-10-CM

## 2018-07-24 ENCOUNTER — Telehealth: Payer: Self-pay

## 2018-07-24 NOTE — Telephone Encounter (Signed)
Noted  

## 2018-07-24 NOTE — Telephone Encounter (Signed)
LVM to schedule lab visit only tomorrow and then to schedule his CPE at 840 on [address] as a virtual visit.

## 2018-07-24 NOTE — Telephone Encounter (Signed)
Pt returned call and states he will be by the office at [place] for labs tomorrow.

## 2018-07-25 ENCOUNTER — Other Ambulatory Visit: Payer: Self-pay

## 2018-07-25 ENCOUNTER — Ambulatory Visit (INDEPENDENT_AMBULATORY_CARE_PROVIDER_SITE_OTHER): Payer: BLUE CROSS/BLUE SHIELD | Admitting: Family Medicine

## 2018-07-25 DIAGNOSIS — Z0000 Encounter for general adult medical examination without abnormal findings: Secondary | ICD-10-CM

## 2018-07-25 DIAGNOSIS — Z125 Encounter for screening for malignant neoplasm of prostate: Secondary | ICD-10-CM

## 2018-07-25 DIAGNOSIS — I10 Essential (primary) hypertension: Secondary | ICD-10-CM

## 2018-07-25 DIAGNOSIS — Z131 Encounter for screening for diabetes mellitus: Secondary | ICD-10-CM

## 2018-07-25 NOTE — Progress Notes (Signed)
Nurse visit for labs only

## 2018-07-26 ENCOUNTER — Other Ambulatory Visit (HOSPITAL_COMMUNITY): Payer: Self-pay | Admitting: *Deleted

## 2018-07-26 ENCOUNTER — Telehealth (INDEPENDENT_AMBULATORY_CARE_PROVIDER_SITE_OTHER): Payer: BLUE CROSS/BLUE SHIELD | Admitting: Family Medicine

## 2018-07-26 ENCOUNTER — Other Ambulatory Visit: Payer: Self-pay

## 2018-07-26 ENCOUNTER — Other Ambulatory Visit (HOSPITAL_COMMUNITY): Payer: Self-pay

## 2018-07-26 DIAGNOSIS — I5022 Chronic systolic (congestive) heart failure: Secondary | ICD-10-CM

## 2018-07-26 DIAGNOSIS — I428 Other cardiomyopathies: Secondary | ICD-10-CM | POA: Diagnosis not present

## 2018-07-26 DIAGNOSIS — I10 Essential (primary) hypertension: Secondary | ICD-10-CM | POA: Diagnosis not present

## 2018-07-26 DIAGNOSIS — E7800 Pure hypercholesterolemia, unspecified: Secondary | ICD-10-CM

## 2018-07-26 DIAGNOSIS — M109 Gout, unspecified: Secondary | ICD-10-CM | POA: Diagnosis not present

## 2018-07-26 LAB — COMPREHENSIVE METABOLIC PANEL
A/G RATIO: 1.5 (ref 1.2–2.2)
ALT: 22 IU/L (ref 0–44)
AST: 19 IU/L (ref 0–40)
Albumin: 3.8 g/dL (ref 3.8–4.9)
Alkaline Phosphatase: 74 IU/L (ref 39–117)
BUN/Creatinine Ratio: 13 (ref 9–20)
BUN: 13 mg/dL (ref 6–24)
Bilirubin Total: 0.3 mg/dL (ref 0.0–1.2)
CO2: 21 mmol/L (ref 20–29)
Calcium: 8.9 mg/dL (ref 8.7–10.2)
Chloride: 105 mmol/L (ref 96–106)
Creatinine, Ser: 1.04 mg/dL (ref 0.76–1.27)
GFR calc Af Amer: 94 mL/min/{1.73_m2} (ref >59)
GFR calc non Af Amer: 81 mL/min/{1.73_m2} (ref >59)
Globulin, Total: 2.5 g/dL (ref 1.5–4.5)
Glucose: 134 mg/dL — ABNORMAL HIGH (ref 65–99)
POTASSIUM: 4.1 mmol/L (ref 3.5–5.2)
Sodium: 144 mmol/L (ref 134–144)
Total Protein: 6.3 g/dL (ref 6.0–8.5)

## 2018-07-26 LAB — HEMOGLOBIN A1C
Est. average glucose Bld gHb Est-mCnc: 126 mg/dL
Hgb A1c MFr Bld: 6 % — ABNORMAL HIGH (ref 4.8–5.6)

## 2018-07-26 LAB — LIPID PANEL
Chol/HDL Ratio: 6.1 ratio — ABNORMAL HIGH (ref 0.0–5.0)
Cholesterol, Total: 140 mg/dL (ref 100–199)
HDL: 23 mg/dL — ABNORMAL LOW (ref >39)
LDL Calculated: 85 mg/dL (ref 0–99)
TRIGLYCERIDES: 160 mg/dL — AB (ref 0–149)
VLDL Cholesterol Cal: 32 mg/dL (ref 5–40)

## 2018-07-26 LAB — CBC
Hematocrit: 40.1 % (ref 37.5–51.0)
Hemoglobin: 14 g/dL (ref 13.0–17.7)
MCH: 29.7 pg (ref 26.6–33.0)
MCHC: 34.9 g/dL (ref 31.5–35.7)
MCV: 85 fL (ref 79–97)
Platelets: 201 10*3/uL (ref 150–450)
RBC: 4.72 x10E6/uL (ref 4.14–5.80)
RDW: 13.8 % (ref 11.6–15.4)
WBC: 7 10*3/uL (ref 3.4–10.8)

## 2018-07-26 LAB — PSA: Prostate Specific Ag, Serum: 0.4 ng/mL (ref 0.0–4.0)

## 2018-07-26 LAB — HIV ANTIBODY (ROUTINE TESTING W REFLEX): HIV Screen 4th Generation wRfx: NONREACTIVE

## 2018-07-26 LAB — TSH: TSH: 3.78 u[IU]/mL (ref 0.450–4.500)

## 2018-07-26 LAB — HEPATITIS C ANTIBODY: Hep C Virus Ab: <0.1 s/co ratio (ref 0.0–0.9)

## 2018-07-26 MED ORDER — SIMVASTATIN 40 MG PO TABS: 40.0000 mg | ORAL_TABLET | Freq: Every evening | ORAL | 3 refills | Status: DC

## 2018-07-26 MED ORDER — FUROSEMIDE 40 MG PO TABS: ORAL_TABLET | ORAL | 0 refills | Status: DC

## 2018-07-26 MED ORDER — CARVEDILOL 25 MG PO TABS: ORAL_TABLET | ORAL | 3 refills | Status: DC

## 2018-07-26 MED ORDER — SPIRONOLACTONE 25 MG PO TABS: ORAL_TABLET | ORAL | 1 refills | Status: DC

## 2018-07-26 MED ORDER — SACUBITRIL-VALSARTAN 97-103 MG PO TABS: 1.0000 | ORAL_TABLET | Freq: Two times a day (BID) | ORAL | 2 refills | Status: DC

## 2018-07-26 NOTE — Progress Notes (Signed)
Pt has no concerns today but needs all medications refilled.  Pt is now using [place] [facility] as primary.

## 2018-07-26 NOTE — Progress Notes (Signed)
Refill for entresto has been sent and order for echo has been placed.

## 2018-07-26 NOTE — Progress Notes (Signed)
Telemedicine Encounter- SOAP NOTE Established Patient  This telephone encounter was conducted with the patient's (or proxy's) verbal consent via audio telecommunications: yes/no: Yes Patient was instructed to have this encounter in a suitably private space; and to only have persons present to whom they give permission to participate. In addition, patient identity was confirmed by use of name plus two identifiers (DOB and address).  I discussed the limitations, risks, security and privacy concerns of performing an evaluation and management service by telephone and the availability of in person appointments. I also discussed with the patient that there may be a patient responsible charge related to this service. The patient expressed understanding and agreed to proceed.  I spent a total of TIME; 0 MIN TO 60 MIN: 22 minutes talking with the patient or their proxy.  CC:  I need my meds refilled, CHF and high cholesterol   Subjective   Gary Williamson is a 53 y.o. established patient. Telephone visit today for  HPI Heart Disease Patient has a history of NICM and Chronic systolic heart failure and essential hypertension LV 25%-30%  He denies any chest pains, weight gain, or shortness of breath He reports that he weighs himself regularly and his reading has been 336 He does not have a blood pressure cuff but last month his blood pressure at the local [facility] was 140/80 He walks at work and denies any lower extremity edema, DOE or PND. He has not seen his Cardiologist Dr. Marigene Ehlers in 2 years.  He came in for lab testing yesterday.   Dyslipidemia: Patient presents for evaluation of lipids.  Compliance with treatment thus far has been excellent.  A repeat fasting lipid profile was done.  The patient does use medications that may worsen dyslipidemias (corticosteroids, progestins, anabolic steroids, diuretics, beta-blockers, amiodarone, cyclosporine, olanzapine). The patient exercises intermittently.   The patient is known to have coexisting coronary artery disease.   Gout He completed the colchicine for gout He is not on tramadol anymore He denies any joint pains at this time He denies myalgias  Morbid obesity Wt Readings from Last 3 Encounters:  05/31/18 (!) 356 lb (161.5 kg)  11/23/17 (!) 343 lb 6.4 oz (155.8 kg)  07/15/17 (!) 332 lb (150.6 kg)   He reports his weight on his home scale this morning was 336lbs   Patient Active Problem List   Diagnosis Date Noted  . Class 3 severe obesity with serious comorbidity and body mass index (BMI) of 40.0 to 44.9 in adult ([place]) 12/21/2016  . Essential hypertension 11/22/2016  . Benign neoplasm of cecum   . Benign neoplasm of sigmoid colon   . Hematuria, microscopic 05/15/2013  . NICM (nonischemic cardiomyopathy) ([place]) 04/09/2013  . OSA (obstructive sleep apnea) 01/18/2013  . CARDIOVASCULAR FUNCTION STUDY, ABNORMAL 11/04/2009  . Chronic systolic heart failure ([place]) 10/14/2009  . Vitamin D deficiency 05/02/2009  . HLD (hyperlipidemia) 03/05/2009    Past Medical History:  Diagnosis Date  . Acute idiopathic pericarditis 03/24/2010   Overview:  Viral Idiopathic Pericarditis   . Chronic systolic heart failure ([place])   . Hypertension   . LBBB (left bundle branch block)    Chronic  . Leg edema   . NICM (nonischemic cardiomyopathy) ([place])    Probably nonischemic; LHC in 2002/03 no sig CAD per report (done in [place]). Cardiomyopathy for 9-10 yrs. 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 Echo 5/11: severe LVE, EF <20%, diff HK, mod dias dys, sev. LAE. Hosp for CHF in 5/11. Ad.  MV 6/11: EF 21%, mild per-infart ischemia. LHC (7/11): EF 15%, no CAD, LVEDP 22; Echo (10/11): EF 20-25%;  Echo (12/12):  EF 20%, mod LVH, Gr 1 DD, diff HK, mild LAE  . OSA (obstructive sleep apnea)    cpap used  . Pure hypercholesterolemia   . Severe obesity ([place])   . Vitamin D deficiency     Current Outpatient Medications  Medication Sig Dispense Refill  .  aspirin 81 MG EC tablet Take 1 tablet (81 mg total) by mouth daily. 30 tablet 2  . carvedilol (COREG) 25 MG tablet TAKE 1 TABLET BY MOUTH TWICE A DAY 180 tablet 3  . Cholecalciferol (VITAMIN D) 2000 units CAPS Take by mouth daily.    Marland Kitchen ENTRESTO 97-103 MG TAKE ONE TABLET BY MOUTH TWICE DAILY 60 tablet 11  . fish oil-omega-3 fatty acids 1000 MG capsule Take 1 capsule (1 g total) by mouth daily. 30 capsule 2  . furosemide (LASIX) 40 MG tablet Take 1 tablet (40 mg total) every morning and 0.5 tablet (20 mg total) every afternoon. Office visit needed 135 tablet 0  . Multiple Vitamin (MULTIVITAMIN) tablet Take 1 tablet by mouth daily.      Marland Kitchen spironolactone (ALDACTONE) 25 MG tablet TAKE 1 TABLET BY MOUTH DAILY. 90 tablet 1  . simvastatin (ZOCOR) 40 MG tablet Take 1 tablet (40 mg total) by mouth every evening. 90 tablet 3   No current facility-administered medications for this visit.     No Known Allergies  Social History   Socioeconomic History  . Marital status: Married    Spouse name: [name]  . Number of children: 2  . Years of education: Not on file  . Highest education level: Not on file  Occupational History  . 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 Financial resource strain: Not on file  . Food insecurity:    Worry: Not on file    Inability: Not on file  . Transportation needs:    Medical: Not on file    Non-medical: Not on file  Tobacco Use  . Smoking status: Never Smoker  . Smokeless tobacco: Never Used  Substance and Sexual Activity  . Alcohol use: Yes    Alcohol/week: 0.0 - 2.0 standard drinks    Comment: rare to occ.  . Drug use: No  . Sexual activity: Yes    Birth control/protection: Condom  Lifestyle  . Physical activity:    Days per week: Not on file    Minutes per session: Not on file  . Stress: Not on file  Relationships  . Social connections:    Talks on phone: Not  on file    Gets together: Not on file    Attends religious service: Not on file    Active member of club or organization: Not on file    Attends meetings of clubs or organizations: Not on file    Relationship status: Not on file  . Intimate partner violence:    Fear of current or ex partner: Not on file    Emotionally abused: Not on file    Physically abused: Not on file    Forced sexual activity: Not on file  Other Topics Concern  . Not on file  Social History Narrative   Marital status: married x 17 years; happily married      Children: 2 children (15,12)  Lives in [place] with his wife and 2 children      Employment: [employer]; [place] x 12 years; likes work.  Lots of traveling.       Tobacco: none      Alcohol:  1 drink per week      Exercise:  Walking three days per week; 20 minutes.       Seatbelt: 100%    ROS Review of Systems  Constitutional: Negative for activity change, appetite change, chills and fever.  HENT: Negative for congestion, nosebleeds, trouble swallowing and voice change.   Respiratory: Negative for cough, shortness of breath and wheezing.   Gastrointestinal: Negative for diarrhea, nausea and vomiting.  Genitourinary: Negative for difficulty urinating, dysuria, flank pain and hematuria.  Musculoskeletal: Negative for back pain, joint swelling and neck pain.  Neurological: Negative for dizziness, speech difficulty, light-headedness and numbness.  See HPI. All other review of systems negative.   Objective   Vitals as reported by the patient:  336 pounds There were no vitals filed for this visit.  Diagnoses and all orders for this visit:  Morbid obesity ([place])- advised weight loss and conscientious eating  NICM (nonischemic cardiomyopathy) ([place]) Essential hypertension Chronic systolic heart failure ([place]) Advised pt to get a home bp cuff and check his bp at least 3 times a week Also emphasized to keep on hand if he gets fatigue or dizziness so  he can check his readings Continue current meds Reviewed lab testing Discussed that he should make a televisit with Cardiology regarding Entresto -     furosemide (LASIX) 40 MG tablet; Take 1 tablet (40 mg total) every morning and 0.5 tablet (20 mg total) every afternoon. Office visit needed -     spironolactone (ALDACTONE) 25 MG tablet; TAKE 1 TABLET BY MOUTH DAILY.  Acute gouty arthritis- resolved, avoid triggers, stopped colchicine  Pure hypercholesterolemia- reviewed labs, doing well, continue zocor  Other orders -     simvastatin (ZOCOR) 40 MG tablet; Take 1 tablet (40 mg total) by mouth every evening. -     carvedilol (COREG) 25 MG tablet; TAKE 1 TABLET BY MOUTH TWICE A DAY     I discussed the assessment and treatment plan with the patient. The patient was provided an opportunity to ask questions and all were answered. The patient agreed with the plan and demonstrated an understanding of the instructions.   The patient was advised to call back or seek an in-person evaluation if the symptoms worsen or if the condition fails to improve as anticipated.  I provided 22 minutes of non-face-to-face time during this encounter.  [name], MD  Primary Care at [facility]

## 2018-07-26 NOTE — Progress Notes (Signed)
Please refill Entresto.  He needs office visit and also echo scheduled as well.

## 2018-07-26 NOTE — Progress Notes (Signed)
Refill for entresto ordered per MD. ECHO ordered per MD as patient lost to follow up. Will forward to scheduler to arrange f/u

## 2018-08-09 ENCOUNTER — Ambulatory Visit (INDEPENDENT_AMBULATORY_CARE_PROVIDER_SITE_OTHER): Payer: BLUE CROSS/BLUE SHIELD

## 2018-08-09 ENCOUNTER — Other Ambulatory Visit: Payer: Self-pay

## 2018-08-09 DIAGNOSIS — I5022 Chronic systolic (congestive) heart failure: Secondary | ICD-10-CM

## 2018-08-09 DIAGNOSIS — Z95810 Presence of automatic (implantable) cardiac defibrillator: Secondary | ICD-10-CM | POA: Diagnosis not present

## 2018-08-10 ENCOUNTER — Telehealth: Payer: BLUE CROSS/BLUE SHIELD | Admitting: Internal Medicine

## 2018-08-10 ENCOUNTER — Encounter: Payer: BLUE CROSS/BLUE SHIELD | Admitting: *Deleted

## 2018-08-10 ENCOUNTER — Other Ambulatory Visit: Payer: Self-pay

## 2018-08-10 ENCOUNTER — Telehealth: Payer: Self-pay

## 2018-08-10 NOTE — Telephone Encounter (Signed)
Left message for patient to remind of missed remote transmission.  

## 2018-08-11 NOTE — Progress Notes (Signed)
EPIC Encounter for ICM Monitoring  Patient Name: Gary Williamson is a 53 y.o. male Date: 08/11/2018 Primary Care Physican: [name], MD Primary Cardiologist: [name] Electrophysiologist: [name] Bi-V Pacing: >99% Today's Weight: unknown   Heart failure questions reviewed and pt feeling fine.  He continues to work but precautions are taken on the job regarding the [place] [address].  Thoracic impedance normal.  Prescribed:Furosemide 40 mg 1 tabletevery morning and 0.5 tablet (20 mg total) every afternoon.  Labs: 12/30/2017 Creatinine 1.16, BUN 15, Potassium 3.7, Sodium 140, EGFR >60 07/15/2017 Creatinine 1.17, BUN 15, Potassium 4.3, Sodium 144, EGFR 71-82 02/01/2017 Creatinine 1.10, BUN 14, Potassium 4.1, Sodium 137 11/22/2016 Creatinine 0.98, BUN 12, Potassium 4.1, Sodium 144, EGFR 88-102 08/24/2016 Creatinine 1.13, BUN 12, Potassium 4.4, Sodium 142  Recommendations:Encouraged to call for fluid symptoms.   Follow-up plan: ICM clinic phone appointment on5/04/2019.  Copy of ICM check sent to Dr.[name].  3 month ICM trend: 08/09/2018    1 Year ICM trend:       [name], RN 08/11/2018 1:57 PM

## 2018-09-03 IMAGING — DX DG TOE GREAT 2+V*L*
3 series · 3 of 3 positions shown · non-contrast
Comparison: None.

CLINICAL DATA: Redness, swelling, pain at first MTP joint

EXAM:
LEFT GREAT TOE

[toe ap]
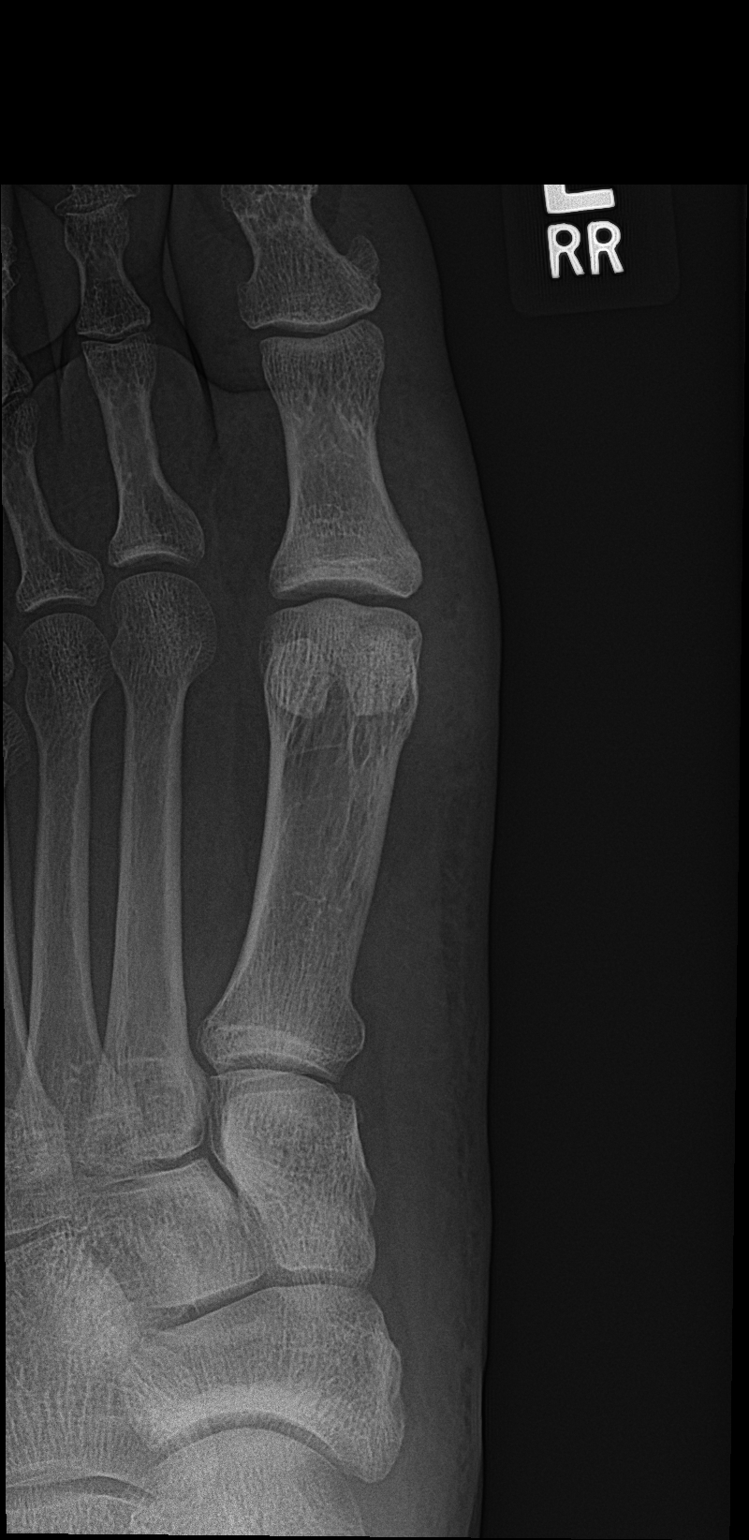

[toe obl]
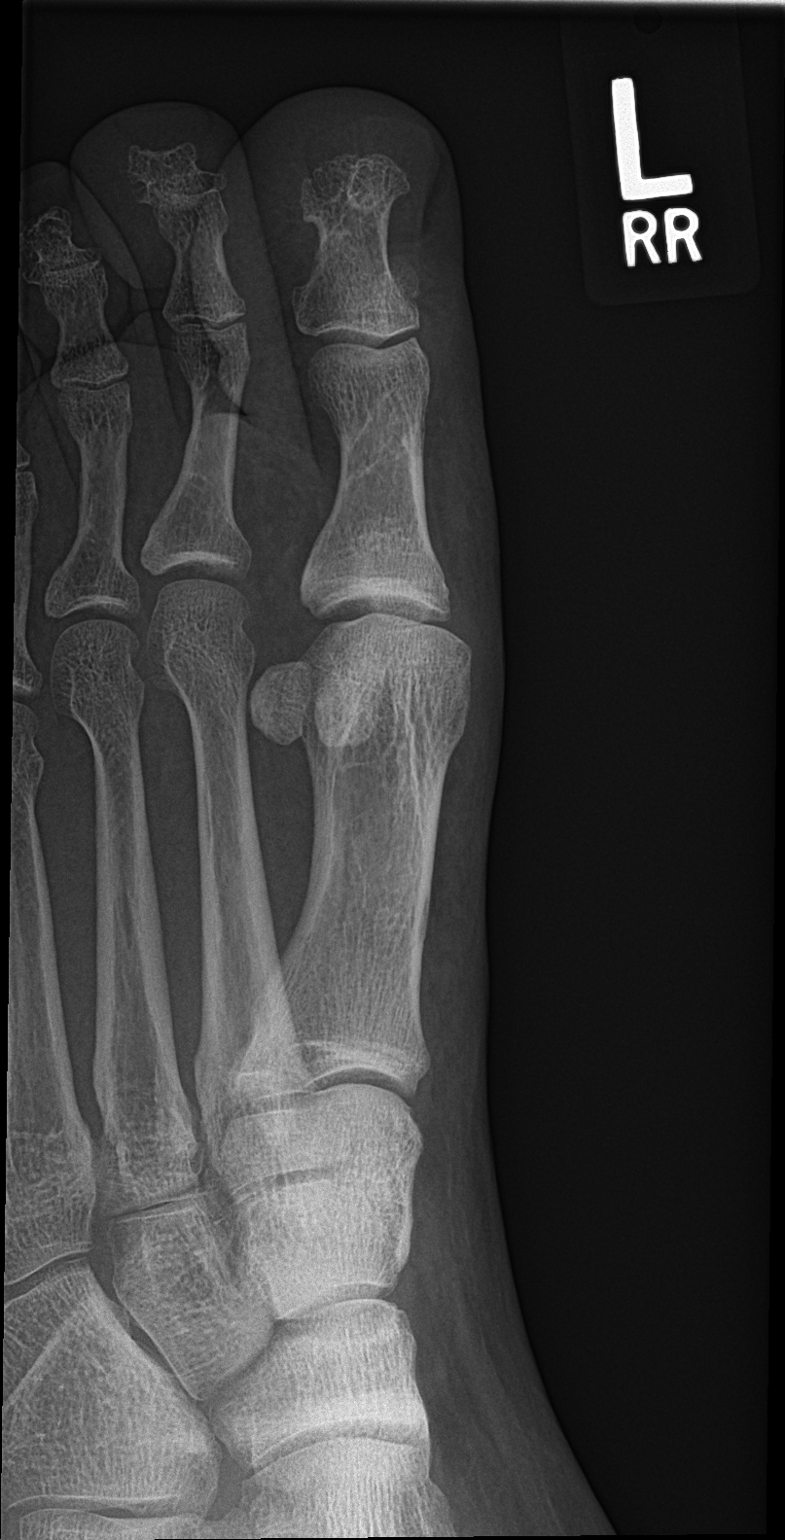

[toe lat]
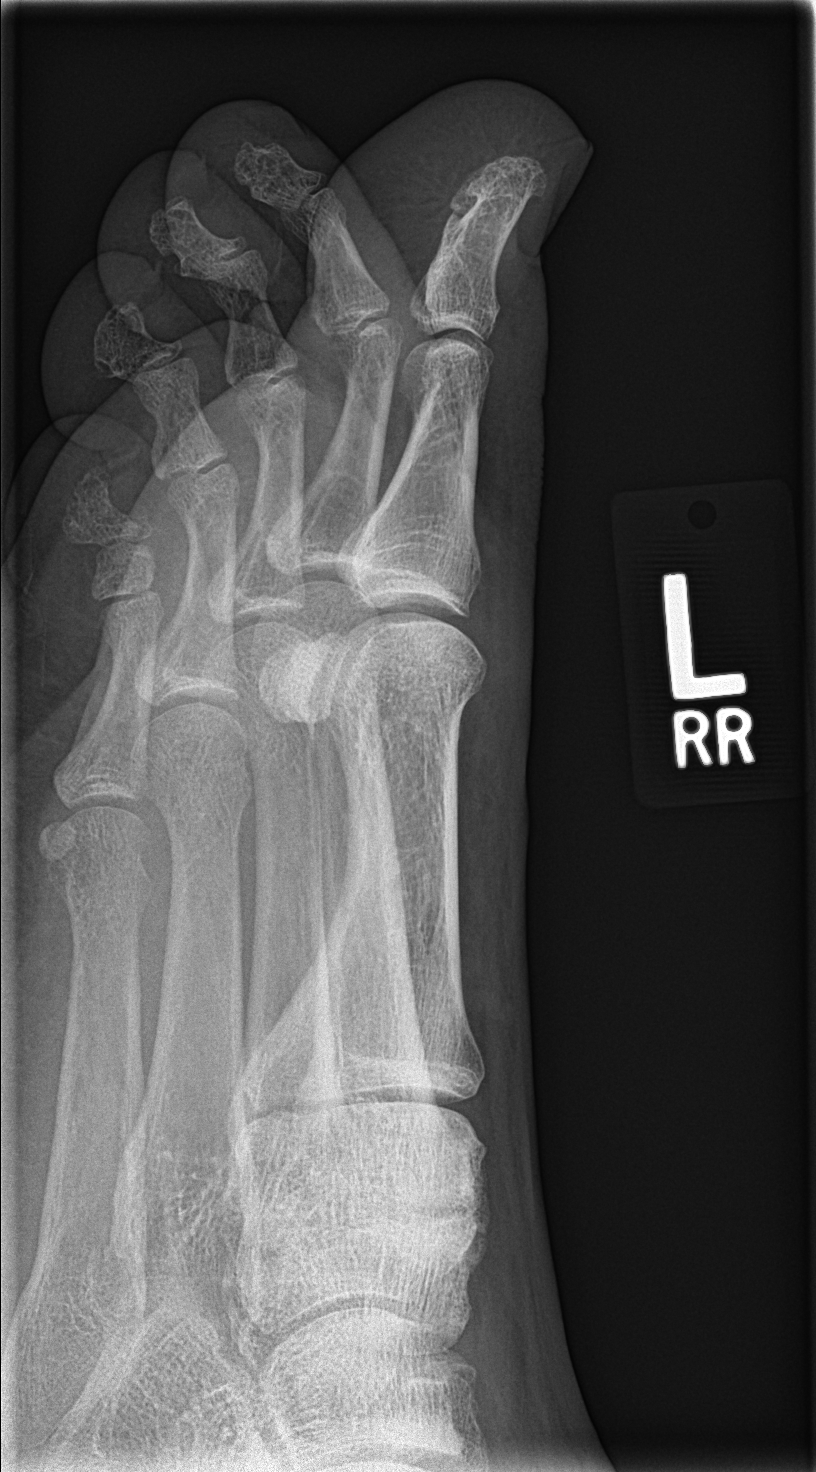

[3 of 3 positions shown; findings below may reference images not displayed]

FINDINGS: There is no evidence of fracture or dislocation. There is no
evidence of arthropathy or other focal bone abnormality. Soft
tissues are unremarkable.
IMPRESSION: Negative.

## 2018-09-08 ENCOUNTER — Encounter: Payer: Self-pay | Admitting: Registered Nurse

## 2018-09-08 ENCOUNTER — Other Ambulatory Visit: Payer: Self-pay

## 2018-09-08 ENCOUNTER — Ambulatory Visit: Payer: BLUE CROSS/BLUE SHIELD | Admitting: Registered Nurse

## 2018-09-08 VITALS — BP 158/83 | HR 87 | Temp 98.0°F | Resp 18 | Ht 73.0 in | Wt 359.0 lb

## 2018-09-08 DIAGNOSIS — M109 Gout, unspecified: Secondary | ICD-10-CM

## 2018-09-08 MED ORDER — MELOXICAM 7.5 MG PO TABS: 7.5000 mg | ORAL_TABLET | Freq: Every day | ORAL | 0 refills | Status: AC

## 2018-09-08 MED ORDER — COLCHICINE 0.6 MG PO TABS: ORAL_TABLET | ORAL | 1 refills | Status: DC

## 2018-09-08 MED ORDER — PREDNISONE 10 MG PO TABS: ORAL_TABLET | ORAL | 0 refills | Status: DC

## 2018-09-08 MED ORDER — CEFADROXIL 500 MG PO CAPS: 500.0000 mg | ORAL_CAPSULE | Freq: Two times a day (BID) | ORAL | 0 refills | Status: DC

## 2018-09-08 NOTE — Progress Notes (Signed)
Acute Office Visit  Subjective:    Patient ID: Gary Williamson, male    DOB: 1965-10-25, 53 y.o.   MRN: [id number]  Chief Complaint  Patient presents with  . Gout    right foot big toe x 2 weeks. Pt states still painful with some redness    Pt reports recurrence of acute gouty arthritis.   Redness, swelling, heat of joint of great toe of R foot. Has had two gout attacks so far this year, has seen [facility] and a non-[facility] provider.    Patient is in today for acute gout flare up  Past Medical History:  Diagnosis Date  . Acute idiopathic pericarditis 03/24/2010   Overview:  Viral Idiopathic Pericarditis   . Chronic systolic heart failure ([place])   . Hypertension   . LBBB (left bundle branch block)    Chronic  . Leg edema   . NICM (nonischemic cardiomyopathy) ([place])    Probably nonischemic; LHC in 2002/03 no sig CAD per report (done in [place]). Cardiomyopathy for 9-10 yrs. 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 Echo 5/11: severe LVE, EF <20%, diff HK, mod dias dys, sev. LAE. Hosp for CHF in 5/11. Ad. MV 6/11: EF 21%, mild per-infart ischemia. LHC (7/11): EF 15%, no CAD, LVEDP 22; Echo (10/11): EF 20-25%;  Echo (12/12):  EF 20%, mod LVH, Gr 1 DD, diff HK, mild LAE  . OSA (obstructive sleep apnea)    cpap used  . Pure hypercholesterolemia   . Severe obesity ([place])   . Vitamin D deficiency     Past Surgical History:  Procedure Laterality Date  . BI-VENTRICULAR IMPLANTABLE CARDIOVERTER DEFIBRILLATOR N/A 08/21/2013   Procedure: BI-VENTRICULAR IMPLANTABLE CARDIOVERTER DEFIBRILLATOR  (CRT-D);  Surgeon: Coralyn Mark, MD;  Location: [facility] CATH LAB;  Service: Cardiovascular;  Laterality: N/A;  . CARDIAC CATHETERIZATION  2002 or 2003  . COLONOSCOPY N/A 09/16/2014   Procedure: COLONOSCOPY;  Surgeon: [name], MD;  Location: [facility] ENDOSCOPY;  Service: Endoscopy;  Laterality: N/A;  . LEFT HEART CATH  11/13/09   Dr [name]    Family History  Problem Relation Age of Onset  . Hypertension Mother    . Alzheimer's disease Mother   . Hyperlipidemia Mother   . Breast cancer Mother   . Cancer Mother        breast cancer  . Stroke Maternal Grandmother   . Arthritis Father   . Dementia Father   . Hypertension Father   . Alzheimer's disease Father   . COPD Brother   . Coronary artery disease Neg Hx   . Heart failure Neg Hx   . Heart disease Neg Hx     Social History   Socioeconomic History  . Marital status: Married    Spouse name: [name]  . Number of children: 2  . Years of education: Not on file  . Highest education level: Not on file  Occupational History  . 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 Financial resource strain: Not on file  . Food insecurity:    Worry: Not on file    Inability: Not on file  . Transportation needs:    Medical: Not on file    Non-medical: Not on file  Tobacco Use  . Smoking status: Never Smoker  . Smokeless tobacco: Never Used  Substance and Sexual Activity  . Alcohol use: Yes    Alcohol/week:  0.0 - 2.0 standard drinks    Comment: rare to occ.  . Drug use: No  . Sexual activity: Yes    Birth control/protection: Condom  Lifestyle  . Physical activity:    Days per week: Not on file    Minutes per session: Not on file  . Stress: Not on file  Relationships  . Social connections:    Talks on phone: Not on file    Gets together: Not on file    Attends religious service: Not on file    Active member of club or organization: Not on file    Attends meetings of clubs or organizations: Not on file    Relationship status: Not on file  . Intimate partner violence:    Fear of current or ex partner: Not on file    Emotionally abused: Not on file    Physically abused: Not on file    Forced sexual activity: Not on file  Other Topics Concern  . Not on file  Social History Narrative   Marital status: married x 17 years; happily married       Children: 2 children (15,12)      Lives in [place] with his wife and 2 children      Employment: [employer]; Solicitor x 12 years; likes work.  Lots of traveling.       Tobacco: none      Alcohol:  1 drink per week      Exercise:  Walking three days per week; 20 minutes.       Seatbelt: 100%    Outpatient Medications Prior to Visit  Medication Sig Dispense Refill  . aspirin 81 MG EC tablet Take 1 tablet (81 mg total) by mouth daily. 30 tablet 2  . carvedilol (COREG) 25 MG tablet TAKE 1 TABLET BY MOUTH TWICE A DAY 180 tablet 3  . Cholecalciferol (VITAMIN D) 2000 units CAPS Take by mouth daily.    . fish oil-omega-3 fatty acids 1000 MG capsule Take 1 capsule (1 g total) by mouth daily. 30 capsule 2  . furosemide (LASIX) 40 MG tablet Take 1 tablet (40 mg total) every morning and 0.5 tablet (20 mg total) every afternoon. Office visit needed 135 tablet 0  . Multiple Vitamin (MULTIVITAMIN) tablet Take 1 tablet by mouth daily.      . sacubitril-valsartan (ENTRESTO) 97-103 MG Take 1 tablet by mouth 2 (two) times daily. 60 tablet 2  . simvastatin (ZOCOR) 40 MG tablet Take 1 tablet (40 mg total) by mouth every evening. 90 tablet 3  . spironolactone (ALDACTONE) 25 MG tablet TAKE 1 TABLET BY MOUTH DAILY. 90 tablet 1  . colchicine 0.6 MG tablet TAKE 2 TABLETS BY MOUTH NOW AND THEN 1 TABLET ONCE DAILY FOR 5 DAYS     No facility-administered medications prior to visit.     No Known Allergies  Review of Systems  Constitutional: Negative.   HENT: Negative.   Eyes: Negative.   Respiratory: Negative.   Cardiovascular: Negative.   Gastrointestinal: Negative.   Genitourinary: Negative.   Musculoskeletal: Negative.   Skin: Negative.   Neurological: Negative.   Endo/Heme/Allergies: Negative.   Psychiatric/Behavioral: Negative.        Objective:    Physical Exam  Constitutional: He is oriented to person, place, and time. He appears well-developed and well-nourished.  HENT:  Head:  Normocephalic.  Cardiovascular: Normal rate and regular rhythm.  Pulmonary/Chest: Effort normal and breath sounds normal.  Neurological: He is alert and oriented to  person, place, and time. He has normal reflexes.  Skin: Skin is warm and dry. No rash noted. There is erythema (joint R great toe). No pallor.  Psychiatric: He has a normal mood and affect. His behavior is normal. Judgment and thought content normal.    BP (!) 158/83   Pulse 87   Temp 98 F (36.7 C) (Oral)   Resp 18   Ht 6\' 1"  (1.854 m)   Wt (!) 359 lb (162.8 kg)   SpO2 95%   BMI 47.36 kg/m  Wt Readings from Last 3 Encounters:  09/08/18 (!) 359 lb (162.8 kg)  05/31/18 (!) 356 lb (161.5 kg)  11/23/17 (!) 343 lb 6.4 oz (155.8 kg)    There are no preventive care reminders to display for this patient.  There are no preventive care reminders to display for this patient.   Lab Results  Component Value Date   TSH 3.780 07/25/2018   Lab Results  Component Value Date   WBC 7.0 07/25/2018   HGB 14.0 07/25/2018   HCT 40.1 07/25/2018   MCV 85 07/25/2018   PLT 201 07/25/2018   Lab Results  Component Value Date   NA 144 07/25/2018   K 4.1 07/25/2018   CO2 21 07/25/2018   GLUCOSE 134 (H) 07/25/2018   BUN 13 07/25/2018   CREATININE 1.04 07/25/2018   BILITOT 0.3 07/25/2018   ALKPHOS 74 07/25/2018   AST 19 07/25/2018   ALT 22 07/25/2018   PROT 6.3 07/25/2018   ALBUMIN 3.8 07/25/2018   CALCIUM 8.9 07/25/2018   ANIONGAP 8 12/30/2017   GFR 78.03 10/01/2013   Lab Results  Component Value Date   CHOL 140 07/25/2018   Lab Results  Component Value Date   HDL 23 (L) 07/25/2018   Lab Results  Component Value Date   LDLCALC 85 07/25/2018   Lab Results  Component Value Date   TRIG 160 (H) 07/25/2018   Lab Results  Component Value Date   CHOLHDL 6.1 (H) 07/25/2018   Lab Results  Component Value Date   HGBA1C 6.0 (H) 07/25/2018       Assessment & Plan:   Problem List Items Addressed This Visit     None    Visit Diagnoses    Acute gouty arthritis    -  Primary   Relevant Medications   colchicine 0.6 MG tablet   meloxicam (MOBIC) 7.5 MG tablet   predniSONE (DELTASONE) 10 MG tablet   cefadroxil (DURICEF) 500 MG capsule       Meds ordered this encounter  Medications  . colchicine 0.6 MG tablet    Sig: TAKE 2 TABLETS BY MOUTH NOW AND THEN 1 TABLET ONCE DAILY FOR 5 DAYS    Dispense:  6 tablet    Refill:  1    Order Specific Question:   Supervising Provider    Answer:   Delia Chimes A [id number]  . meloxicam (MOBIC) 7.5 MG tablet    Sig: Take 1 tablet (7.5 mg total) by mouth daily for 14 days.    Dispense:  14 tablet    Refill:  0    Order Specific Question:   Supervising Provider    Answer:   Delia Chimes A [id number]  . predniSONE (DELTASONE) 10 MG tablet    Sig: Taper: Day 1: 40mg , Day2: 30mg , Day3: 20mg , Day4: 10mg     Dispense:  10 tablet    Refill:  0    Order Specific Question:   Supervising Provider  AnswerDelia Chimes A [id number]  . cefadroxil (DURICEF) 500 MG capsule    Sig: Take 1 capsule (500 mg total) by mouth 2 (two) times daily.    Dispense:  14 capsule    Refill:  0    Order Specific Question:   Supervising Provider    Answer:   [name] [id number]   PLAN:  Orders sent to pharmacy: colchicine, mobic, prednisone, and duricef. Patient has had luck with this regimen in past.  Suggested discussing preventative therapy with PCP Dr. Nolon Rod  Patient encouraged to call clinic with any questions, comments, or concerns.   Maximiano Coss, NP

## 2018-09-08 NOTE — Patient Instructions (Signed)
° ° ° °  If you have lab work done today you will be contacted with your lab results within the next 2 weeks.  If you have not heard from us then please contact us. The fastest way to get your results is to register for My Chart. ° ° °IF you received an x-ray today, you will receive an invoice from [facility]. Please contact [facility] at [phone] with questions or concerns regarding your invoice.  ° °IF you received labwork today, you will receive an invoice from [facility]. Please contact [facility] at [phone] with questions or concerns regarding your invoice.  ° °Our billing staff will not be able to assist you with questions regarding bills from these companies. ° °You will be contacted with the lab results as soon as they are available. The fastest way to get your results is to activate your My Chart account. Instructions are located on the last page of this paperwork. If you have not heard from us regarding the results in 2 weeks, please contact this office. °  ° ° ° °

## 2018-09-11 ENCOUNTER — Ambulatory Visit (INDEPENDENT_AMBULATORY_CARE_PROVIDER_SITE_OTHER): Payer: BLUE CROSS/BLUE SHIELD

## 2018-09-11 ENCOUNTER — Other Ambulatory Visit: Payer: Self-pay

## 2018-09-11 DIAGNOSIS — I5022 Chronic systolic (congestive) heart failure: Secondary | ICD-10-CM | POA: Diagnosis not present

## 2018-09-11 DIAGNOSIS — Z95810 Presence of automatic (implantable) cardiac defibrillator: Secondary | ICD-10-CM

## 2018-09-12 ENCOUNTER — Encounter: Payer: BLUE CROSS/BLUE SHIELD | Admitting: *Deleted

## 2018-09-12 ENCOUNTER — Other Ambulatory Visit: Payer: Self-pay

## 2018-09-13 NOTE — Progress Notes (Signed)
EPIC Encounter for ICM Monitoring  Patient Name: Gary Williamson is a 53 y.o. male Date: 09/13/2018 Primary Care Physican: [name], MD Primary Cardiologist: [name] Electrophysiologist: [name] Bi-V Pacing: >99% Today's Weight: unknown   Attempted call to patient and unable to reach.  Left message to return call regarding transmission. Transmission reviewed.    Thoracic impedanceabnormal suggesting fluid accumulation since ~09/08/2018.  Prescribed:Furosemide 40 mg 1 tabletevery morning and 0.5 tablet (20 mg total) every afternoon.  Labs: 07/25/2018 Creatinine 1.04, BUN 13, Potassium 4.1, Sodium 144, GFR 81-94 12/30/2017 Creatinine 1.16, BUN 15, Potassium 3.7, Sodium 140, GFR >60 07/15/2017 Creatinine 1.17, BUN 15, Potassium 4.3, Sodium 144, GFR 71-82  Recommendations:Unable to reach.  If patient reached, will advise to take Furosemide 40 mg bid x 3 days and then return to prescribed dosage.     Follow-up plan: ICM clinic phone appointment on5/21/2020 to recheck fluid levels.  Copy of ICM check sent to Dr.[name] and Dr [name].   3 month ICM trend: 09/11/2018    1 Year ICM trend:       [name], RN 09/13/2018 12:15 PM

## 2018-09-21 ENCOUNTER — Other Ambulatory Visit: Payer: Self-pay

## 2018-09-21 ENCOUNTER — Ambulatory Visit (INDEPENDENT_AMBULATORY_CARE_PROVIDER_SITE_OTHER): Payer: BLUE CROSS/BLUE SHIELD

## 2018-09-21 DIAGNOSIS — I5022 Chronic systolic (congestive) heart failure: Secondary | ICD-10-CM

## 2018-09-21 DIAGNOSIS — Z95810 Presence of automatic (implantable) cardiac defibrillator: Secondary | ICD-10-CM

## 2018-09-22 NOTE — Progress Notes (Signed)
EPIC Encounter for ICM Monitoring  Patient Name: Gary Williamson is a 53 y.o. male Date: 09/22/2018 Primary Care Physican: [name], MD Primary Cardiologist: [name] Electrophysiologist: [name] Bi-V Pacing: >99% Today's Weight: unknown   Transmission reviewed and results sent via mychart  Thoracic impedancereturned to normal since 09/11/2018 transmission.  Prescribed:Furosemide 40 mg 1 tabletevery morning and 0.5 tablet (20 mg total) every afternoon.  Labs: 07/25/2018 Creatinine 1.04, BUN 13, Potassium 4.1, Sodium 144, GFR 81-94 12/30/2017 Creatinine 1.16, BUN 15, Potassium 3.7, Sodium 140, GFR >60 07/15/2017 Creatinine 1.17, BUN 15, Potassium 4.3, Sodium 144, GFR 71-82  Recommendations: None  Follow-up plan: ICM clinic phone appointment on6/17/2020.  Copy of ICM check sent to Dr.[name].   3 month ICM trend: 09/21/2018    1 Year ICM trend:       [name], RN 09/22/2018 3:49 PM

## 2018-09-28 NOTE — Progress Notes (Signed)
Letter  

## 2018-10-11 ENCOUNTER — Other Ambulatory Visit: Payer: Self-pay | Admitting: Family Medicine

## 2018-10-11 DIAGNOSIS — I5022 Chronic systolic (congestive) heart failure: Secondary | ICD-10-CM

## 2018-10-18 ENCOUNTER — Ambulatory Visit (INDEPENDENT_AMBULATORY_CARE_PROVIDER_SITE_OTHER): Payer: BC Managed Care – PPO

## 2018-10-18 DIAGNOSIS — I5022 Chronic systolic (congestive) heart failure: Secondary | ICD-10-CM

## 2018-10-18 DIAGNOSIS — Z95810 Presence of automatic (implantable) cardiac defibrillator: Secondary | ICD-10-CM

## 2018-10-20 ENCOUNTER — Telehealth: Payer: Self-pay

## 2018-10-20 NOTE — Telephone Encounter (Signed)
Remote ICM transmission received.  Attempted call to patient regarding ICM remote transmission and left detailed message, per DPR, with next ICM remote transmission date of 11/20/2018.  Advised to return call for any fluid symptoms or questions.

## 2018-10-20 NOTE — Progress Notes (Signed)
EPIC Encounter for ICM Monitoring  Patient Name: Gary Williamson is a 53 y.o. male Date: 10/20/2018 Primary Care Physican: [name], MD Primary Cardiologist: [name] Electrophysiologist: [name] Bi-V Pacing: >99% Today's Weight: unknown   Attempted call to patient and unable to reach.  Left detailed message per DPR regarding transmission. Transmission reviewed.   Thoracic impedancetrending above baseline suggesting possible dryness.  Prescribed:Furosemide 40 mg 1 tabletevery morning and 0.5 tablet (20 mg total) every afternoon.  Labs: 07/25/2018 Creatinine 1.04, BUN 13, Potassium 4.1, Sodium 144, GFR 81-94 12/30/2017 Creatinine 1.16, BUN 15, Potassium 3.7, Sodium 140, GFR >60 07/15/2017 Creatinine 1.17, BUN 15, Potassium 4.3, Sodium 144, GFR 71-82  Recommendations: Left voice mail with ICM number and encouraged to call if experiencing any fluid symptoms.  Follow-up plan: ICM clinic phone appointment on7/20/2020.  Copy of ICM check sent to Dr.[name]  3 month ICM trend: 10/18/2018    1 Year ICM trend:       [name], RN 10/20/2018 3:25 PM

## 2018-11-20 ENCOUNTER — Ambulatory Visit (INDEPENDENT_AMBULATORY_CARE_PROVIDER_SITE_OTHER): Payer: BC Managed Care – PPO

## 2018-11-20 DIAGNOSIS — I5022 Chronic systolic (congestive) heart failure: Secondary | ICD-10-CM

## 2018-11-20 DIAGNOSIS — Z95810 Presence of automatic (implantable) cardiac defibrillator: Secondary | ICD-10-CM

## 2018-11-22 NOTE — Progress Notes (Signed)
EPIC Encounter for ICM Monitoring  Patient Name: Gary Williamson is a 53 y.o. male Date: 11/22/2018 Primary Care Physican: [name], MD Primary Cardiologist: [name] Electrophysiologist: [name] Bi-V Pacing: >99% Today's Weight: unknown   Transmission reviewed.   [place] Thoracic impedancenormal.  Prescribed:Furosemide 40 mg 1 tabletevery morning and 0.5 tablet (20 mg total) every afternoon.  Labs: 07/25/2018 Creatinine 1.04, BUN 13, Potassium 4.1, Sodium 144, GFR 81-94 12/30/2017 Creatinine 1.16, BUN 15, Potassium 3.7, Sodium 140, GFR >60 07/15/2017 Creatinine 1.17, BUN 15, Potassium 4.3, Sodium 144, GFR 71-82  Recommendations:None  Follow-up plan: ICM clinic phone appointment on9/14/2020. Office visit with Dr [name] 12/11/2018.  Copy of ICM check sent to Dr.[name]  Direct trend Viewer: 11/16/2018     [name], RN 11/22/2018 5:14 PM

## 2018-11-23 ENCOUNTER — Ambulatory Visit (INDEPENDENT_AMBULATORY_CARE_PROVIDER_SITE_OTHER): Payer: BC Managed Care – PPO | Admitting: *Deleted

## 2018-11-23 DIAGNOSIS — I428 Other cardiomyopathies: Secondary | ICD-10-CM | POA: Diagnosis not present

## 2018-11-23 DIAGNOSIS — I5022 Chronic systolic (congestive) heart failure: Secondary | ICD-10-CM

## 2018-11-23 LAB — CUP PACEART REMOTE DEVICE CHECK
Battery Remaining Longevity: 28 mo
Battery Remaining Percentage: 31 %
Battery Voltage: 2.89 V
Brady Statistic AP VP Percent: 2.3 %
Brady Statistic AP VS Percent: 1 %
Brady Statistic AS VP Percent: 97 %
Brady Statistic AS VS Percent: 1 %
Brady Statistic RA Percent Paced: 2.2 %
Brady Statistic RV Percent Paced: 99 %
Date Time Interrogation Session: 20200723055016
HighPow Impedance: 61 Ohm
HighPow Impedance: 61 Ohm
Implantable Lead Implant Date: 20150421
Implantable Lead Implant Date: 20150421
Implantable Lead Implant Date: 20150421
Implantable Lead Location: 753857
Implantable Lead Location: 753859
Implantable Lead Location: 753860
Implantable Pulse Generator Implant Date: 20150421
Lead Channel Impedance Value: 400 Ohm
Lead Channel Impedance Value: 440 Ohm
Lead Channel Impedance Value: 810 Ohm
Lead Channel Pacing Threshold Amplitude: 0.625 V
Lead Channel Pacing Threshold Amplitude: 0.75 V
Lead Channel Pacing Threshold Amplitude: 0.75 V
Lead Channel Pacing Threshold Pulse Width: 0.5 ms
Lead Channel Pacing Threshold Pulse Width: 0.5 ms
Lead Channel Pacing Threshold Pulse Width: 0.5 ms
Lead Channel Sensing Intrinsic Amplitude: 4.4 mV
Lead Channel Sensing Intrinsic Amplitude: 7.2 mV
Lead Channel Setting Pacing Amplitude: 2 V
Lead Channel Setting Pacing Amplitude: 2 V
Lead Channel Setting Pacing Amplitude: 2 V
Lead Channel Setting Pacing Pulse Width: 0.5 ms
Lead Channel Setting Pacing Pulse Width: 0.5 ms
Lead Channel Setting Sensing Sensitivity: 0.5 mV
Pulse Gen Serial Number: 7157119

## 2018-12-05 NOTE — Progress Notes (Signed)
Remote ICD transmission.   

## 2018-12-07 ENCOUNTER — Telehealth: Payer: Self-pay

## 2018-12-07 NOTE — Telephone Encounter (Signed)
Left message regarding appt on 12/11/18.

## 2018-12-11 ENCOUNTER — Telehealth: Payer: BC Managed Care – PPO | Admitting: Internal Medicine

## 2019-01-01 ENCOUNTER — Other Ambulatory Visit: Payer: Self-pay | Admitting: Family Medicine

## 2019-01-01 DIAGNOSIS — I5022 Chronic systolic (congestive) heart failure: Secondary | ICD-10-CM

## 2019-01-01 NOTE — Telephone Encounter (Signed)
Requested medication (s) are due for refill today: yes Requested medication (s) are on the active medication list:yes Last refill:  07/26/2018 Future visit scheduled: no Notes to clinic:  Review or refill   Requested Prescriptions  Pending Prescriptions Disp Refills   spironolactone (ALDACTONE) 25 MG tablet [Pharmacy Med Name: SPIRONOLACTONE 25MG  TABLETS] 90 tablet 1    Sig: TAKE 1 TABLET BY MOUTH DAILY.     Cardiovascular: Diuretics - Aldosterone Antagonist Failed - 01/01/2019 11:52 AM      Failed - Last BP in normal range    BP Readings from Last 1 Encounters:  09/08/18 (!) 158/83         Passed - Cr in normal range and within 360 days    Creat  Date Value Ref Range Status  02/24/2016 1.06 0.70 - 1.33 mg/dL Final    Comment:      For patients > or = 55 years of age: The upper reference limit for Creatinine is approximately 13% higher for people identified as African-American.      Creatinine, Ser  Date Value Ref Range Status  07/25/2018 1.04 0.76 - 1.27 mg/dL Final         Passed - K in normal range and within 360 days    Potassium  Date Value Ref Range Status  07/25/2018 4.1 3.5 - 5.2 mmol/L Final         Passed - Na in normal range and within 360 days    Sodium  Date Value Ref Range Status  07/25/2018 144 134 - 144 mmol/L Final         Passed - Valid encounter within last 6 months    Recent Outpatient Visits          3 months ago Acute gouty arthritis   [facility] [place], NP   5 months ago Annual physical exam   [facility] [place], MD   7 months ago Pain of right great toe   [facility] at [facility], Ines Bloomer, MD   1 year ago Essential hypertension   [facility] at [facility], Renette Butters, MD   1 year ago Routine physical examination   [facility] at [facility], Renette Butters, MD              furosemide (LASIX) 40 MG tablet [Pharmacy Med Name: FUROSEMIDE 40MG  TABLETS] 135 tablet 0    Sig: TAKE ONE  TABLET BY MOUTH EVERY MORNING AND ONE-HALF TABLET [place]. OFFICE VISIT NEEDED.     Cardiovascular:  Diuretics - Loop Failed - 01/01/2019 11:52 AM      Failed - Last BP in normal range    BP Readings from Last 1 Encounters:  09/08/18 (!) 158/83         Passed - K in normal range and within 360 days    Potassium  Date Value Ref Range Status  07/25/2018 4.1 3.5 - 5.2 mmol/L Final         Passed - Ca in normal range and within 360 days    Calcium  Date Value Ref Range Status  07/25/2018 8.9 8.7 - 10.2 mg/dL Final         Passed - Na in normal range and within 360 days    Sodium  Date Value Ref Range Status  07/25/2018 144 134 - 144 mmol/L Final         Passed - Cr in normal range and within 360 days  Creat  Date Value Ref Range Status  02/24/2016 1.06 0.70 - 1.33 mg/dL Final    Comment:      For patients > or = 55 years of age: The upper reference limit for Creatinine is approximately 13% higher for people identified as African-American.      Creatinine, Ser  Date Value Ref Range Status  07/25/2018 1.04 0.76 - 1.27 mg/dL Final         Passed - Valid encounter within last 6 months    Recent Outpatient Visits          3 months ago Acute gouty arthritis   [facility] [place], NP   5 months ago Annual physical exam   [facility] at [facility], Arlie Solomons, MD   7 months ago Pain of right great toe   [facility] at [facility], Ines Bloomer, MD   1 year ago Essential hypertension   Primary Care at [facility], Renette Butters, MD   1 year ago Routine physical examination   Primary Care at [facility], Renette Butters, MD

## 2019-01-15 ENCOUNTER — Ambulatory Visit (INDEPENDENT_AMBULATORY_CARE_PROVIDER_SITE_OTHER): Payer: BC Managed Care – PPO

## 2019-01-15 DIAGNOSIS — I5022 Chronic systolic (congestive) heart failure: Secondary | ICD-10-CM

## 2019-01-15 DIAGNOSIS — Z95810 Presence of automatic (implantable) cardiac defibrillator: Secondary | ICD-10-CM | POA: Diagnosis not present

## 2019-01-15 NOTE — Progress Notes (Signed)
EPIC Encounter for ICM Monitoring  Patient Name: Gary Williamson is a 53 y.o. male Date: 01/15/2019 Primary Care Physican: [name], MD Primary Cardiologist: [name] Electrophysiologist: [name] Bi-V Pacing: >99% Today's Weight: unknown  Attempted call to patient and unable to reach.  Left detailed message per DPR regarding transmission. Transmission reviewed.    [place]vue Thoracic impedancenormal.  Prescribed:Furosemide 40 mg 1 tabletevery morning and 0.5 tablet (20 mg total) every afternoon.  Labs: 07/25/2018 Creatinine 1.04, BUN 13, Potassium 4.1, Sodium 144, GFR 81-94 12/30/2017 Creatinine 1.16, BUN 15, Potassium 3.7, Sodium 140, GFR >60 07/15/2017 Creatinine 1.17, BUN 15, Potassium 4.3, Sodium 144, GFR 71-82  Recommendations: Left voice mail with ICM number and encouraged to call if experiencing any fluid symptoms.  Follow-up plan: ICM clinic phone appointment on 02/26/2019.   91 day device clinic remote transmission 02/23/2019.  Virtual office appt 01/19/2019 with Dr. [name].    Copy of ICM check sent to Dr. [name].   3 month ICM trend: 01/15/2019    1 Year ICM trend:       [name], RN 01/15/2019 11:21 AM

## 2019-01-16 ENCOUNTER — Telehealth: Payer: Self-pay

## 2019-01-16 NOTE — Telephone Encounter (Signed)
Remote ICM transmission received.  Attempted call to patient regarding ICM remote transmission and left detailed message per DPR.  Advised to return call for any fluid symptoms or questions.  

## 2019-01-19 ENCOUNTER — Ambulatory Visit (INDEPENDENT_AMBULATORY_CARE_PROVIDER_SITE_OTHER): Payer: BC Managed Care – PPO | Admitting: Internal Medicine

## 2019-01-19 ENCOUNTER — Other Ambulatory Visit: Payer: Self-pay

## 2019-01-19 ENCOUNTER — Encounter: Payer: Self-pay | Admitting: Internal Medicine

## 2019-01-19 VITALS — BP 132/84 | HR 80 | Ht 73.0 in | Wt 356.0 lb

## 2019-01-19 DIAGNOSIS — Z95810 Presence of automatic (implantable) cardiac defibrillator: Secondary | ICD-10-CM | POA: Diagnosis not present

## 2019-01-19 DIAGNOSIS — I5022 Chronic systolic (congestive) heart failure: Secondary | ICD-10-CM | POA: Diagnosis not present

## 2019-01-19 DIAGNOSIS — I428 Other cardiomyopathies: Secondary | ICD-10-CM | POA: Diagnosis not present

## 2019-01-19 NOTE — Patient Instructions (Addendum)
Medication Instructions:  Your physician recommends that you continue on your current medications as directed. Please refer to the Current Medication list given to you today.  Labwork: None ordered.  Testing/Procedures: None ordered.  Follow-Up: Your physician wants you to follow-up in: one year with [name], NP.   You will receive a reminder letter in the mail two months in advance. If you don't receive a letter, please call our office to schedule the follow-up appointment.  Remote monitoring is used to monitor your ICD from home. This monitoring reduces the number of office visits required to check your device to one time per year. It allows [name] to keep an eye on the functioning of your device to ensure it is working properly. You are scheduled for a device check from home on 02/23/2019. You may send your transmission at any time that day. If you have a wireless device, the transmission will be sent automatically. After your physician reviews your transmission, you will receive a postcard with your next transmission date.  Any Other Special Instructions Will Be Listed Below (If Applicable).  If you need a refill on your cardiac medications before your next appointment, please call your pharmacy.

## 2019-01-19 NOTE — Progress Notes (Signed)
PCP: [name], MD Primary Cardiologist: Dr [name] Primary EP: Dr Magda Kiel Bedore is a 53 y.o. male who presents today for routine electrophysiology followup.  Since last being seen in our clinic, the patient reports doing very well.  Today, he denies symptoms of palpitations, chest pain, shortness of breath,  lower extremity edema, dizziness, presyncope, syncope, or ICD shocks.  The patient is otherwise without complaint today.   Past Medical History:  Diagnosis Date  . Acute idiopathic pericarditis 03/24/2010   Overview:  Viral Idiopathic Pericarditis   . Chronic systolic heart failure ([place])   . Hypertension   . LBBB (left bundle branch block)    Chronic  . Leg edema   . NICM (nonischemic cardiomyopathy) ([place])    Probably nonischemic; LHC in 2002/03 no sig CAD per report (done in [place]). Cardiomyopathy for 9-10 yrs. 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 Echo 5/11: severe LVE, EF <20%, diff HK, mod dias dys, sev. LAE. Hosp for CHF in 5/11. Ad. MV 6/11: EF 21%, mild per-infart ischemia. LHC (7/11): EF 15%, no CAD, LVEDP 22; Echo (10/11): EF 20-25%;  Echo (12/12):  EF 20%, mod LVH, Gr 1 DD, diff HK, mild LAE  . OSA (obstructive sleep apnea)    cpap used  . Pure hypercholesterolemia   . Severe obesity ([place])   . Vitamin D deficiency    Past Surgical History:  Procedure Laterality Date  . BI-VENTRICULAR IMPLANTABLE CARDIOVERTER DEFIBRILLATOR N/A 08/21/2013   Procedure: BI-VENTRICULAR IMPLANTABLE CARDIOVERTER DEFIBRILLATOR  (CRT-D);  Surgeon: Coralyn Mark, MD;  Location: [facility] CATH LAB;  Service: Cardiovascular;  Laterality: N/A;  . CARDIAC CATHETERIZATION  2002 or 2003  . COLONOSCOPY N/A 09/16/2014   Procedure: COLONOSCOPY;  Surgeon: [name], MD;  Location: [facility] ENDOSCOPY;  Service: Endoscopy;  Laterality: N/A;  . LEFT HEART CATH  11/13/09   Dr [name]    ROS- all systems are reviewed and negative except as per HPI above  Current Outpatient Medications  Medication Sig  Dispense Refill  . aspirin 81 MG EC tablet Take 1 tablet (81 mg total) by mouth daily. 30 tablet 2  . carvedilol (COREG) 25 MG tablet TAKE 1 TABLET BY MOUTH TWICE A DAY 180 tablet 3  . cefadroxil (DURICEF) 500 MG capsule Take 1 capsule (500 mg total) by mouth 2 (two) times daily. 14 capsule 0  . Cholecalciferol (VITAMIN D) 2000 units CAPS Take by mouth daily.    . colchicine 0.6 MG tablet TAKE 2 TABLETS BY MOUTH NOW AND THEN 1 TABLET ONCE DAILY FOR 5 DAYS 6 tablet 1  . fish oil-omega-3 fatty acids 1000 MG capsule Take 1 capsule (1 g total) by mouth daily. 30 capsule 2  . furosemide (LASIX) 40 MG tablet TAKE ONE TABLET BY MOUTH EVERY MORNING AND ONE-HALF TABLET EVERY AFTERNOON. OFFICE VISIT NEEDED. 135 tablet 0  . Multiple Vitamin (MULTIVITAMIN) tablet Take 1 tablet by mouth daily.      . predniSONE (DELTASONE) 10 MG tablet Taper: Day 1: 40mg , Day2: 30mg , Day3: 20mg , Day4: 10mg  10 tablet 0  . sacubitril-valsartan (ENTRESTO) 97-103 MG Take 1 tablet by mouth 2 (two) times daily. 60 tablet 2  . simvastatin (ZOCOR) 40 MG tablet Take 1 tablet (40 mg total) by mouth every evening. 90 tablet 3  . spironolactone (ALDACTONE) 25 MG tablet TAKE 1 TABLET BY MOUTH DAILY 90 tablet 1   No current facility-administered medications for this visit.     Physical Exam: Vitals:  01/19/19 1548  BP: 132/84  Pulse: 80  SpO2: 95%  Weight: (!) 356 lb (161.5 kg)  Height: 6\' 1"  (1.854 m)    GEN- The patient is well appearing, alert and oriented x 3 today.   Head- normocephalic, atraumatic Eyes-  Sclera clear, conjunctiva pink Ears- hearing intact Oropharynx- clear Lungs- Clear to ausculation bilaterally, normal work of breathing Chest- ICD pocket is well healed Heart- Regular rate and rhythm, no murmurs, rubs or gallops, PMI not laterally displaced GI- soft, NT, ND, + BS Extremities- no clubbing, cyanosis, or edema  ICD interrogation- reviewed in detail today,  See PACEART report  ekg tracing ordered  today is personally reviewed and shows sinus with BiV pacing  Wt Readings from Last 3 Encounters:  01/19/19 (!) 356 lb (161.5 kg)  09/08/18 (!) 359 lb (162.8 kg)  05/31/18 (!) 356 lb (161.5 kg)    Assessment and Plan:  1.  Chronic systolic dysfunction euvolemic today Stable on an appropriate medical regimen Normal ICD function See Pace Art report No changes today he is not device dependant today followed in ICM device clinic  2. HTN Stable No change required today  3. Overweight Lifestyle modification encouraged today  [place] Return to see EP NP in a year   [name] MD, [facility] 01/19/2019 4:17 PM

## 2019-01-29 LAB — CUP PACEART INCLINIC DEVICE CHECK
Battery Remaining Longevity: 26 mo
Brady Statistic RA Percent Paced: 2.5 %
Brady Statistic RV Percent Paced: 99.22 %
Date Time Interrogation Session: 20200918190352
HighPow Impedance: 71 Ohm
Implantable Lead Implant Date: 20150421
Implantable Lead Implant Date: 20150421
Implantable Lead Implant Date: 20150421
Implantable Lead Location: 753857
Implantable Lead Location: 753859
Implantable Lead Location: 753860
Implantable Pulse Generator Implant Date: 20150421
Lead Channel Impedance Value: 375 Ohm
Lead Channel Impedance Value: 462.5 Ohm
Lead Channel Impedance Value: 862.5 Ohm
Lead Channel Pacing Threshold Amplitude: 0.625 V
Lead Channel Pacing Threshold Amplitude: 0.75 V
Lead Channel Pacing Threshold Amplitude: 0.75 V
Lead Channel Pacing Threshold Pulse Width: 0.5 ms
Lead Channel Pacing Threshold Pulse Width: 0.5 ms
Lead Channel Pacing Threshold Pulse Width: 0.5 ms
Lead Channel Sensing Intrinsic Amplitude: 11.9 mV
Lead Channel Sensing Intrinsic Amplitude: 4.1 mV
Lead Channel Setting Pacing Amplitude: 2 V
Lead Channel Setting Pacing Amplitude: 2 V
Lead Channel Setting Pacing Amplitude: 2 V
Lead Channel Setting Pacing Pulse Width: 0.5 ms
Lead Channel Setting Pacing Pulse Width: 0.5 ms
Lead Channel Setting Sensing Sensitivity: 0.5 mV
Pulse Gen Serial Number: 7157119

## 2019-02-17 ENCOUNTER — Other Ambulatory Visit (HOSPITAL_COMMUNITY): Payer: Self-pay | Admitting: Cardiology

## 2019-02-17 DIAGNOSIS — I428 Other cardiomyopathies: Secondary | ICD-10-CM

## 2019-02-23 ENCOUNTER — Ambulatory Visit (INDEPENDENT_AMBULATORY_CARE_PROVIDER_SITE_OTHER): Payer: BC Managed Care – PPO | Admitting: *Deleted

## 2019-02-23 DIAGNOSIS — I5022 Chronic systolic (congestive) heart failure: Secondary | ICD-10-CM

## 2019-02-23 DIAGNOSIS — I428 Other cardiomyopathies: Secondary | ICD-10-CM | POA: Diagnosis not present

## 2019-02-25 LAB — CUP PACEART REMOTE DEVICE CHECK
Battery Remaining Longevity: 25 mo
Battery Remaining Percentage: 29 %
Battery Voltage: 2.87 V
Brady Statistic AP VP Percent: 3.7 %
Brady Statistic AP VS Percent: 1 %
Brady Statistic AS VP Percent: 96 %
Brady Statistic AS VS Percent: 1 %
Brady Statistic RA Percent Paced: 3.6 %
Date Time Interrogation Session: 20201023075524
HighPow Impedance: 70 Ohm
HighPow Impedance: 70 Ohm
Implantable Lead Implant Date: 20150421
Implantable Lead Implant Date: 20150421
Implantable Lead Implant Date: 20150421
Implantable Lead Location: 753857
Implantable Lead Location: 753859
Implantable Lead Location: 753860
Implantable Pulse Generator Implant Date: 20150421
Lead Channel Impedance Value: 350 Ohm
Lead Channel Impedance Value: 450 Ohm
Lead Channel Impedance Value: 780 Ohm
Lead Channel Pacing Threshold Amplitude: 0.625 V
Lead Channel Pacing Threshold Amplitude: 0.75 V
Lead Channel Pacing Threshold Amplitude: 0.75 V
Lead Channel Pacing Threshold Pulse Width: 0.5 ms
Lead Channel Pacing Threshold Pulse Width: 0.5 ms
Lead Channel Pacing Threshold Pulse Width: 0.5 ms
Lead Channel Sensing Intrinsic Amplitude: 3.5 mV
Lead Channel Sensing Intrinsic Amplitude: 7.8 mV
Lead Channel Setting Pacing Amplitude: 2 V
Lead Channel Setting Pacing Amplitude: 2 V
Lead Channel Setting Pacing Amplitude: 2 V
Lead Channel Setting Pacing Pulse Width: 0.5 ms
Lead Channel Setting Pacing Pulse Width: 0.5 ms
Lead Channel Setting Sensing Sensitivity: 0.5 mV
Pulse Gen Serial Number: 7157119

## 2019-02-26 ENCOUNTER — Ambulatory Visit (INDEPENDENT_AMBULATORY_CARE_PROVIDER_SITE_OTHER): Payer: BC Managed Care – PPO

## 2019-02-26 DIAGNOSIS — Z95810 Presence of automatic (implantable) cardiac defibrillator: Secondary | ICD-10-CM | POA: Diagnosis not present

## 2019-02-26 DIAGNOSIS — I5022 Chronic systolic (congestive) heart failure: Secondary | ICD-10-CM | POA: Diagnosis not present

## 2019-02-27 NOTE — Progress Notes (Signed)
EPIC Encounter for ICM Monitoring  Patient Name: Gary Williamson is a 53 y.o. male Date: 02/27/2019 Primary Care Physican: [name], MD Primary Cardiologist: [name] Electrophysiologist: [name] Bi-V Pacing: >99% 01/19/2019 Weight: 356 lbs  Transmission reviewed.    CorvueThoracic impedancenormal.  Prescribed:Furosemide 40 mg 1 tabletevery morning and 0.5 tablet (20 mg total) every afternoon.  Labs: 07/25/2018 Creatinine 1.04, BUN 13, Potassium 4.1, Sodium 144, GFR 81-94 12/30/2017 Creatinine 1.16, BUN 15, Potassium 3.7, Sodium 140, GFR >60 07/15/2017 Creatinine 1.17, BUN 15, Potassium 4.3, Sodium 144, GFR 71-82  Recommendations: None.  Follow-up plan: ICM clinic phone appointment on 04/02/2019.   91 day device clinic remote transmission 05/25/2019.      Copy of ICM check sent to Dr. [name].   3 month ICM trend: 02/23/2019    1 Year ICM trend:       [name], RN 02/27/2019 4:44 PM

## 2019-03-05 NOTE — Progress Notes (Signed)
Remote ICD transmission.   

## 2019-04-07 ENCOUNTER — Other Ambulatory Visit (HOSPITAL_COMMUNITY): Payer: Self-pay | Admitting: Cardiology

## 2019-04-07 DIAGNOSIS — I428 Other cardiomyopathies: Secondary | ICD-10-CM

## 2019-04-10 ENCOUNTER — Ambulatory Visit (INDEPENDENT_AMBULATORY_CARE_PROVIDER_SITE_OTHER): Payer: BC Managed Care – PPO

## 2019-04-10 DIAGNOSIS — I5022 Chronic systolic (congestive) heart failure: Secondary | ICD-10-CM

## 2019-04-10 DIAGNOSIS — Z95810 Presence of automatic (implantable) cardiac defibrillator: Secondary | ICD-10-CM | POA: Diagnosis not present

## 2019-04-11 ENCOUNTER — Telehealth: Payer: Self-pay

## 2019-04-11 NOTE — Telephone Encounter (Signed)
Remote ICM transmission received.  Attempted call to patient regarding ICM remote transmission and left detailed message per DPR.  Advised to return call for any fluid symptoms or questions. Next ICM remote transmission scheduled 03/03/2020.    

## 2019-04-11 NOTE — Progress Notes (Signed)
EPIC Encounter for ICM Monitoring  Patient Name: Gary Williamson is a 53 y.o. male Date: 04/11/2019 Primary Care Physican: [name], MD Primary Cardiologist: [name] Electrophysiologist: [name] Bi-V Pacing: >99% 01/19/2019 Weight: 356 lbs  Attempted call to patient and unable to reach.  Left detailed message per DPR regarding transmission. Transmission reviewed.    CorvueThoracic impedancenormal.  Prescribed:Furosemide 40 mg 1 tabletevery morning and 0.5 tablet (20 mg total) every afternoon.  Labs: 07/25/2018 Creatinine 1.04, BUN 13, Potassium 4.1, Sodium 144, GFR 81-94 12/30/2017 Creatinine 1.16, BUN 15, Potassium 3.7, Sodium 140, GFR >60 07/15/2017 Creatinine 1.17, BUN 15, Potassium 4.3, Sodium 144, GFR 71-82  Recommendations: Left voice mail with ICM number and encouraged to call if experiencing any fluid symptoms.  Follow-up plan: ICM clinic phone appointment on 05/14/2019.   91 day device clinic remote transmission 05/25/2019.      Copy of ICM check sent to Dr. [name].    3 month ICM trend: 04/10/2019    1 Year ICM trend:       [name], RN 04/11/2019 10:46 AM

## 2019-04-30 ENCOUNTER — Telehealth: Payer: Self-pay | Admitting: Family Medicine

## 2019-04-30 NOTE — Telephone Encounter (Signed)
Pt was advised by insurance to call office due to no response to the pharmacy request for furosemide (LASIX) 40 MG tablet Pt needs refill sent to  [place], [place] Phone:  [phone]  Fax:  [phone]

## 2019-05-01 ENCOUNTER — Other Ambulatory Visit: Payer: Self-pay | Admitting: Emergency Medicine

## 2019-05-01 DIAGNOSIS — I5022 Chronic systolic (congestive) heart failure: Secondary | ICD-10-CM

## 2019-05-01 NOTE — Telephone Encounter (Signed)
Please schedule appt. No refills without office visit. Can be in office or tele-med

## 2019-05-01 NOTE — Telephone Encounter (Signed)
Called pt put on [facility] schedule for med refills   FR

## 2019-05-07 ENCOUNTER — Other Ambulatory Visit: Payer: Self-pay

## 2019-05-07 ENCOUNTER — Telehealth (INDEPENDENT_AMBULATORY_CARE_PROVIDER_SITE_OTHER): Payer: BC Managed Care – PPO | Admitting: Family Medicine

## 2019-05-07 VITALS — Wt 341.0 lb

## 2019-05-07 DIAGNOSIS — E7439 Other disorders of intestinal carbohydrate absorption: Secondary | ICD-10-CM

## 2019-05-07 DIAGNOSIS — I428 Other cardiomyopathies: Secondary | ICD-10-CM

## 2019-05-07 DIAGNOSIS — I10 Essential (primary) hypertension: Secondary | ICD-10-CM

## 2019-05-07 DIAGNOSIS — G4733 Obstructive sleep apnea (adult) (pediatric): Secondary | ICD-10-CM | POA: Diagnosis not present

## 2019-05-07 NOTE — Progress Notes (Signed)
Telemedicine Encounter- SOAP NOTE Established Patient  This telephone encounter was conducted with the patient's (or proxy's) verbal consent via audio telecommunications: yes/no: Yes Patient was instructed to have this encounter in a suitably private space; and to only have persons present to whom they give permission to participate. In addition, patient identity was confirmed by use of name plus two identifiers (DOB and address).  I discussed the limitations, risks, security and privacy concerns of performing an evaluation and management service by telephone and the availability of in person appointments. I also discussed with the patient that there may be a patient responsible charge related to this service. The patient expressed understanding and agreed to proceed.  I spent a total of TIME; 0 MIN TO 60 MIN: 25 minutes talking with the patient or their proxy.  Chief Complaint  Patient presents with  . Medication Refill    furosemide, spironolactone and entresto    Subjective   Gary Williamson is a 54 y.o. established patient. Telephone visit today for  HPI  OSA on CPAP Pt reports that he is not using his cpap because it is not very comfortable and he wakes up multiple times at night. He denies any nightime gasping or choking.  Hypertension and CHF Hypertension: Patient here for follow-up of elevated blood pressure. He is not exercising and is adherent to low salt diet.  Blood pressure is well controlled at home. Cardiac symptoms none. Patient denies chest pain, chest pressure/discomfort, claudication, dyspnea, exertional chest pressure/discomfort and fatigue.  Cardiovascular risk factors: dyslipidemia, hypertension and sedentary lifestyle. Use of agents associated with hypertension: none. History of target organ damage: heart failure.  Patient needs refills but is not out of his medications He is compliant with his meds but admits he needs to work on his diet   Patient Active  Problem List   Diagnosis Date Noted  . ICD (implantable cardioverter-defibrillator) in place 01/19/2019  . Class 3 severe obesity with serious comorbidity and body mass index (BMI) of 40.0 to 44.9 in adult ([place]) 12/21/2016  . Essential hypertension 11/22/2016  . Benign neoplasm of cecum   . Benign neoplasm of sigmoid colon   . Hematuria, microscopic 05/15/2013  . NICM (nonischemic cardiomyopathy) ([place]) 04/09/2013  . OSA (obstructive sleep apnea) 01/18/2013  . CARDIOVASCULAR FUNCTION STUDY, ABNORMAL 11/04/2009  . Chronic systolic heart failure ([place]) 10/14/2009  . Vitamin D deficiency 05/02/2009  . HLD (hyperlipidemia) 03/05/2009    Past Medical History:  Diagnosis Date  . Acute idiopathic pericarditis 03/24/2010   Overview:  Viral Idiopathic Pericarditis   . Chronic systolic heart failure ([place])   . Hypertension   . LBBB (left bundle branch block)    Chronic  . Leg edema   . NICM (nonischemic cardiomyopathy) ([place])    Probably nonischemic; LHC in 2002/03 no sig CAD per report (done in [place]). Cardiomyopathy for 9-10 yrs. 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 Echo 5/11: severe LVE, EF <20%, diff HK, mod dias dys, sev. LAE. Hosp for CHF in 5/11. Ad. MV 6/11: EF 21%, mild per-infart ischemia. LHC (7/11): EF 15%, no CAD, LVEDP 22; Echo (10/11): EF 20-25%;  Echo (12/12):  EF 20%, mod LVH, Gr 1 DD, diff HK, mild LAE  . OSA (obstructive sleep apnea)    cpap used  . Pure hypercholesterolemia   . Severe obesity ([place])   . Vitamin D deficiency     Current Outpatient Medications  Medication Sig Dispense Refill  . aspirin 81 MG EC tablet Take  1 tablet (81 mg total) by mouth daily. 30 tablet 2  . carvedilol (COREG) 25 MG tablet TAKE 1 TABLET BY MOUTH TWICE A DAY 180 tablet 3  . Cholecalciferol (VITAMIN D) 2000 units CAPS Take by mouth daily.    Marland Kitchen ENTRESTO 97-103 MG Take 1 tablet by mouth twice daily 60 tablet 0  . fish oil-omega-3 fatty acids 1000 MG capsule Take 1 capsule (1 g total) by mouth  daily. 30 capsule 2  . furosemide (LASIX) 40 MG tablet TAKE ONE TABLET BY MOUTH EVERY MORNING AND ONE-HALF TABLET EVERY AFTERNOON. OFFICE VISIT NEEDED. 135 tablet 0  . Multiple Vitamin (MULTIVITAMIN) tablet Take 1 tablet by mouth daily.      . simvastatin (ZOCOR) 40 MG tablet Take 1 tablet (40 mg total) by mouth every evening. 90 tablet 3  . spironolactone (ALDACTONE) 25 MG tablet TAKE 1 TABLET BY MOUTH DAILY 90 tablet 1   No current facility-administered medications for this visit.    No Known Allergies  Social History   Socioeconomic History  . Marital status: Married    Spouse name: [name]  . Number of children: 2  . Years of education: Not on file  . Highest education level: Not on file  Occupational History  . 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 Smoking status: Never Smoker  . Smokeless tobacco: Never Used  Substance and Sexual Activity  . Alcohol use: Yes    Alcohol/week: 0.0 - 2.0 standard drinks    Comment: rare to occ.  . Drug use: No  . Sexual activity: Yes    Birth control/protection: Condom  Other Topics Concern  . Not on file  Social History Narrative   Marital status: married x 17 years; happily married      Children: 2 children (15,12)      Lives in [place] with his wife and 2 children      Employment: [employer]; Solicitor x 12 years; likes work.  Lots of traveling.       Tobacco: none      Alcohol:  1 drink per week      Exercise:  Walking three days per week; 20 minutes.       Seatbelt: 100%   Social Determinants of Health   Financial Resource Strain:   . Difficulty of Paying Living Expenses: Not on file  Food Insecurity:   . Worried About Charity fundraiser in the Last Year: Not on file  . Ran Out of Food in the Last Year: Not on file  Transportation Needs:   . Lack of Transportation (Medical): Not on file  . Lack of Transportation (Non-Medical): Not  on file  Physical Activity:   . Days of Exercise per Week: Not on file  . Minutes of Exercise per Session: Not on file  Stress:   . Feeling of Stress : Not on file  Social Connections:   . Frequency of Communication with Friends and Family: Not on file  . Frequency of Social Gatherings with Friends and Family: Not on file  . Attends Religious Services: Not on file  . Active Member of Clubs or Organizations: Not on file  . Attends Archivist Meetings: Not on file  . Marital Status: Not on file  Intimate Partner Violence:   . Fear of Current or Ex-Partner: Not on file  . Emotionally Abused: Not on  file  . Physically Abused: Not on file  . Sexually Abused: Not on file    ROS Review of Systems  Constitutional: Negative for activity change, appetite change, chills and fever.  HENT: Negative for congestion, nosebleeds, trouble swallowing and voice change.   Respiratory: Negative for cough, shortness of breath and wheezing.   Gastrointestinal: Negative for diarrhea, nausea and vomiting.  Genitourinary: Negative for difficulty urinating, dysuria, flank pain and hematuria.  Musculoskeletal: Negative for back pain, joint swelling and neck pain.  Neurological: Negative for dizziness, speech difficulty, light-headedness and numbness.  See HPI. All other review of systems negative.   Objective   Vitals as reported by the patient: Today's Vitals   05/07/19 1706  Weight: (!) 341 lb (154.7 kg)   BP Readings from Last 3 Encounters:  01/19/19 132/84  09/08/18 (!) 158/83  05/31/18 140/79   Wt Readings from Last 3 Encounters:  05/07/19 (!) 341 lb (154.7 kg)  01/19/19 (!) 356 lb (161.5 kg)  09/08/18 (!) 359 lb (162.8 kg)    Dvonte was seen today for medication refill.  Diagnoses and all orders for this visit:  NICM (nonischemic cardiomyopathy) ([place]) - discussed the importance of compliance with CPAP and with meds and exercise -     Lipid panel; Future -     CBC;  Future  Essential hypertension- will assess at nurse visit in 5 days -     Lipid panel; Future  OSA (obstructive sleep apnea) - discussed importance of compliance with CPAP -     CBC; Future  Glucose intolerance- will screen for diabetes -     CMP14+EGFR; Future -     Hemoglobin A1c; Future     I discussed the assessment and treatment plan with the patient. The patient was provided an opportunity to ask questions and all were answered. The patient agreed with the plan and demonstrated an understanding of the instructions.   The patient was advised to call back or seek an in-person evaluation if the symptoms worsen or if the condition fails to improve as anticipated.  I provided 25 minutes of non-face-to-face time during this encounter.  [name], MD  Primary Care at [facility]

## 2019-05-07 NOTE — Progress Notes (Signed)
CC: Med refills needed on furosemide, spironolactone and entresto.  No recent bp and pt weight was 341 lb.  No other concerns and no travel outside the [name] or [place] in th past 3 weeks.

## 2019-05-07 NOTE — Patient Instructions (Signed)
° ° ° °  If you have lab work done today you will be contacted with your lab results within the next 2 weeks.  If you have not heard from us then please contact us. The fastest way to get your results is to register for My Chart. ° ° °IF you received an x-ray today, you will receive an invoice from [facility]. Please contact [facility] at [phone] with questions or concerns regarding your invoice.  ° °IF you received labwork today, you will receive an invoice from [facility]. Please contact [facility] at [phone] with questions or concerns regarding your invoice.  ° °Our billing staff will not be able to assist you with questions regarding bills from these companies. ° °You will be contacted with the lab results as soon as they are available. The fastest way to get your results is to activate your My Chart account. Instructions are located on the last page of this paperwork. If you have not heard from us regarding the results in 2 weeks, please contact this office. °  ° ° ° °

## 2019-05-11 ENCOUNTER — Ambulatory Visit: Payer: BC Managed Care – PPO

## 2019-05-11 ENCOUNTER — Other Ambulatory Visit: Payer: Self-pay

## 2019-05-11 DIAGNOSIS — I10 Essential (primary) hypertension: Secondary | ICD-10-CM

## 2019-05-11 DIAGNOSIS — I428 Other cardiomyopathies: Secondary | ICD-10-CM

## 2019-05-11 DIAGNOSIS — E7439 Other disorders of intestinal carbohydrate absorption: Secondary | ICD-10-CM

## 2019-05-11 DIAGNOSIS — G4733 Obstructive sleep apnea (adult) (pediatric): Secondary | ICD-10-CM

## 2019-05-12 LAB — CBC
Hematocrit: 39.8 % (ref 37.5–51.0)
Hemoglobin: 13.6 g/dL (ref 13.0–17.7)
MCH: 29.8 pg (ref 26.6–33.0)
MCHC: 34.2 g/dL (ref 31.5–35.7)
MCV: 87 fL (ref 79–97)
Platelets: 215 10*3/uL (ref 150–450)
RBC: 4.56 x10E6/uL (ref 4.14–5.80)
RDW: 13.4 % (ref 11.6–15.4)
WBC: 9.7 10*3/uL (ref 3.4–10.8)

## 2019-05-12 LAB — CMP14+EGFR
ALT: 22 IU/L (ref 0–44)
AST: 19 IU/L (ref 0–40)
Albumin/Globulin Ratio: 1.6 (ref 1.2–2.2)
Albumin: 3.8 g/dL (ref 3.8–4.9)
Alkaline Phosphatase: 77 IU/L (ref 39–117)
BUN/Creatinine Ratio: 14 (ref 9–20)
BUN: 12 mg/dL (ref 6–24)
Bilirubin Total: 0.2 mg/dL (ref 0.0–1.2)
CO2: 23 mmol/L (ref 20–29)
Calcium: 8.8 mg/dL (ref 8.7–10.2)
Chloride: 106 mmol/L (ref 96–106)
Creatinine, Ser: 0.88 mg/dL (ref 0.76–1.27)
GFR calc Af Amer: 112 mL/min/{1.73_m2} (ref >59)
GFR calc non Af Amer: 97 mL/min/{1.73_m2} (ref >59)
Globulin, Total: 2.4 g/dL (ref 1.5–4.5)
Glucose: 132 mg/dL — ABNORMAL HIGH (ref 65–99)
Potassium: 4.1 mmol/L (ref 3.5–5.2)
Sodium: 141 mmol/L (ref 134–144)
Total Protein: 6.2 g/dL (ref 6.0–8.5)

## 2019-05-12 LAB — LIPID PANEL
Chol/HDL Ratio: 6.1 ratio — ABNORMAL HIGH (ref 0.0–5.0)
Cholesterol, Total: 159 mg/dL (ref 100–199)
HDL: 26 mg/dL — ABNORMAL LOW (ref >39)
LDL Chol Calc (NIH): 104 mg/dL — ABNORMAL HIGH (ref 0–99)
Triglycerides: 165 mg/dL — ABNORMAL HIGH (ref 0–149)
VLDL Cholesterol Cal: 29 mg/dL (ref 5–40)

## 2019-05-12 LAB — HEMOGLOBIN A1C
Est. average glucose Bld gHb Est-mCnc: 123 mg/dL
Hgb A1c MFr Bld: 5.9 % — ABNORMAL HIGH (ref 4.8–5.6)

## 2019-05-14 ENCOUNTER — Telehealth: Payer: Self-pay

## 2019-05-14 ENCOUNTER — Ambulatory Visit (INDEPENDENT_AMBULATORY_CARE_PROVIDER_SITE_OTHER): Payer: BC Managed Care – PPO

## 2019-05-14 DIAGNOSIS — I5022 Chronic systolic (congestive) heart failure: Secondary | ICD-10-CM

## 2019-05-14 DIAGNOSIS — Z95810 Presence of automatic (implantable) cardiac defibrillator: Secondary | ICD-10-CM | POA: Diagnosis not present

## 2019-05-14 NOTE — Progress Notes (Signed)
Increase Lasix to 40 mg bid x 5 days then back to 40 qam/20 qpm.

## 2019-05-14 NOTE — Progress Notes (Signed)
EPIC Encounter for ICM Monitoring  Patient Name: Gary Williamson is a 54 y.o. male Date: 05/14/2019 Primary Care Physican: [name], MD Primary Cardiologist: [name] Electrophysiologist: [name] Bi-V Pacing: >99% 9/18/2020Weight:356 lbs  Attempted call to patient and unable to reach.  Left detailed message per DPR regarding transmission. Transmission reviewed.    CorvueThoracic impedancesuggesting possible ongoing fluid accumulation since 05/11/2019.  Prescribed:Furosemide 40 mg 1 tabletevery morning and 0.5 tablet (20 mg total) every afternoon.  Labs: 05/11/2019 Creatinine 0.88, BUN 12, Potassium 4.1, Sodium 141, GFR 97-112 A complete set of results can be found in Results Review.  Recommendations:  Will advise patient to increase Furosemide if reached.    Left voice mail with ICM number and encouraged to call if experiencing any fluid symptoms.    Follow-up plan: ICM clinic phone appointment on1/22/2021 to recheck fluid levels. 91 day device clinic remote transmission 05/25/2019.   Copy of ICM check sent to Dr.[name].   3 month ICM trend: 05/14/2019    1 Year ICM trend:       [name], RN 05/14/2019 2:24 PM

## 2019-05-14 NOTE — Progress Notes (Signed)
Spoke with patient and transmission reviewed.  Patient denies any fluid symptoms and is feeling fine.  Advised Dr Gary Williamson reviewed report and recommended he take Furosemide 40 mg bid x 5 days and then return to prescribed dosage of 40 mg every AM and 20 mg every PM.  He verbalized understanding.

## 2019-05-14 NOTE — Telephone Encounter (Signed)
Remote ICM transmission received.  Attempted call to patient regarding ICM remote transmission and mail box is full. 

## 2019-05-14 NOTE — Progress Notes (Signed)
Attempted call to patient and unable to reach.  Left message to return call regarding transmission.

## 2019-05-24 ENCOUNTER — Other Ambulatory Visit (HOSPITAL_COMMUNITY): Payer: Self-pay | Admitting: Cardiology

## 2019-05-24 DIAGNOSIS — I428 Other cardiomyopathies: Secondary | ICD-10-CM

## 2019-05-25 ENCOUNTER — Telehealth: Payer: Self-pay

## 2019-05-25 ENCOUNTER — Ambulatory Visit (INDEPENDENT_AMBULATORY_CARE_PROVIDER_SITE_OTHER): Payer: BC Managed Care – PPO | Admitting: *Deleted

## 2019-05-25 DIAGNOSIS — I428 Other cardiomyopathies: Secondary | ICD-10-CM | POA: Diagnosis not present

## 2019-05-25 LAB — CUP PACEART REMOTE DEVICE CHECK
Battery Remaining Longevity: 24 mo
Battery Remaining Percentage: 28 %
Battery Voltage: 2.86 V
Brady Statistic AP VP Percent: 2.9 %
Brady Statistic AP VS Percent: 1 %
Brady Statistic AS VP Percent: 97 %
Brady Statistic AS VS Percent: 1 %
Brady Statistic RA Percent Paced: 2.8 %
Date Time Interrogation Session: 20210122020017
HighPow Impedance: 65 Ohm
HighPow Impedance: 65 Ohm
Implantable Lead Implant Date: 20150421
Implantable Lead Implant Date: 20150421
Implantable Lead Implant Date: 20150421
Implantable Lead Location: 753857
Implantable Lead Location: 753859
Implantable Lead Location: 753860
Implantable Pulse Generator Implant Date: 20150421
Lead Channel Impedance Value: 400 Ohm
Lead Channel Impedance Value: 440 Ohm
Lead Channel Impedance Value: 860 Ohm
Lead Channel Pacing Threshold Amplitude: 0.625 V
Lead Channel Pacing Threshold Amplitude: 0.75 V
Lead Channel Pacing Threshold Amplitude: 0.75 V
Lead Channel Pacing Threshold Pulse Width: 0.5 ms
Lead Channel Pacing Threshold Pulse Width: 0.5 ms
Lead Channel Pacing Threshold Pulse Width: 0.5 ms
Lead Channel Sensing Intrinsic Amplitude: 4.2 mV
Lead Channel Sensing Intrinsic Amplitude: 8.1 mV
Lead Channel Setting Pacing Amplitude: 2 V
Lead Channel Setting Pacing Amplitude: 2 V
Lead Channel Setting Pacing Amplitude: 2 V
Lead Channel Setting Pacing Pulse Width: 0.5 ms
Lead Channel Setting Pacing Pulse Width: 0.5 ms
Lead Channel Setting Sensing Sensitivity: 0.5 mV
Pulse Gen Serial Number: 7157119

## 2019-05-25 NOTE — Telephone Encounter (Signed)
Left message for patient to remind of missed remote transmission.  

## 2019-06-01 NOTE — Progress Notes (Unsigned)
No ICM remote transmission received for 05/25/2019 and next ICM transmission scheduled for 06/25/2019.

## 2019-06-20 ENCOUNTER — Other Ambulatory Visit: Payer: Self-pay | Admitting: Family Medicine

## 2019-06-20 DIAGNOSIS — I5022 Chronic systolic (congestive) heart failure: Secondary | ICD-10-CM

## 2019-06-20 MED ORDER — SPIRONOLACTONE 25 MG PO TABS: 25.0000 mg | ORAL_TABLET | Freq: Every day | ORAL | 1 refills | Status: DC

## 2019-06-20 MED ORDER — SIMVASTATIN 40 MG PO TABS: 40.0000 mg | ORAL_TABLET | Freq: Every evening | ORAL | 3 refills | Status: DC

## 2019-06-20 NOTE — Telephone Encounter (Signed)
Requested Prescriptions  Pending Prescriptions Disp Refills  . spironolactone (ALDACTONE) 25 MG tablet 90 tablet 1    Sig: Take 1 tablet (25 mg total) by mouth daily.     Cardiovascular: Diuretics - Aldosterone Antagonist Passed - 06/20/2019  3:17 PM      Passed - Cr in normal range and within 360 days    Creat  Date Value Ref Range Status  02/24/2016 1.06 0.70 - 1.33 mg/dL Final    Comment:      For patients > or = 56 years of age: The upper reference limit for Creatinine is approximately 13% higher for people identified as African-American.      Creatinine, Ser  Date Value Ref Range Status  05/11/2019 0.88 0.76 - 1.27 mg/dL Final         Passed - K in normal range and within 360 days    Potassium  Date Value Ref Range Status  05/11/2019 4.1 3.5 - 5.2 mmol/L Final         Passed - Na in normal range and within 360 days    Sodium  Date Value Ref Range Status  05/11/2019 141 134 - 144 mmol/L Final         Passed - Last BP in normal range    BP Readings from Last 1 Encounters:  01/19/19 132/84         Passed - Valid encounter within last 6 months    Recent Outpatient Visits          1 month ago NICM (nonischemic cardiomyopathy) ([place])   Primary Care at [facility], Arlie Solomons, MD   9 months ago Acute gouty arthritis   [facility] at [facility], Delfino Lovett, NP   10 months ago Morbid obesity [facility])   Primary Care at [facility], Arlie Solomons, MD   11 months ago Annual physical exam   Primary Care at [facility], Zoe A, MD   1 year ago Pain of right great toe   Primary Care at [facility], Ho-[place], MD             . simvastatin (ZOCOR) 40 MG tablet 90 tablet 3    Sig: Take 1 tablet (40 mg total) by mouth every evening.     Cardiovascular:  Antilipid - Statins Failed - 06/20/2019  3:17 PM      Failed - HDL in normal range and within 360 days    HDL  Date Value Ref Range Status  05/11/2019 26 (L) >39 mg/dL Final         Failed - Triglycerides in  normal range and within 360 days    Triglycerides  Date Value Ref Range Status  05/11/2019 165 (H) 0 - 149 mg/dL Final         Passed - Total Cholesterol in normal range and within 360 days    Cholesterol, Total  Date Value Ref Range Status  05/11/2019 159 100 - 199 mg/dL Final         Passed - LDL in normal range and within 360 days    LDL Chol Calc (NIH)  Date Value Ref Range Status  05/11/2019 104 (H) 0 - 99 mg/dL Final         Passed - Patient is not pregnant      Passed - Valid encounter within last 12 months    Recent Outpatient Visits          1 month ago NICM (nonischemic cardiomyopathy) ([place])  Primary Care at [facility], Arlie Solomons, MD   9 months ago Acute gouty arthritis   [facility] at [facility], Delfino Lovett, NP   10 months ago Morbid obesity [facility])   Primary Care at [place], MD   11 months ago Annual physical exam   Primary Care at [place], MD   1 year ago Pain of right great toe   Primary Care at [facility], Ines Bloomer, MD

## 2019-06-20 NOTE — Telephone Encounter (Signed)
Medication Refill - Medication: simvastatin (ZOCOR) 40 MG tablet [id number]   spironolactone (ALDACTONE) 25 MG tablet [id number]     Preferred Pharmacy (with phone number or street name):  [facility] - [address], [place] [address]  Phone: [phone] Fax: [phone]  Not a 24 hour pharmacy; exact hours not known.     Agent: Please be advised that RX refills may take up to 3 business days. We ask that you follow-up with your pharmacy.

## 2019-06-25 ENCOUNTER — Ambulatory Visit (INDEPENDENT_AMBULATORY_CARE_PROVIDER_SITE_OTHER): Payer: BC Managed Care – PPO

## 2019-06-25 DIAGNOSIS — Z95810 Presence of automatic (implantable) cardiac defibrillator: Secondary | ICD-10-CM | POA: Diagnosis not present

## 2019-06-25 DIAGNOSIS — I5022 Chronic systolic (congestive) heart failure: Secondary | ICD-10-CM

## 2019-06-26 NOTE — Progress Notes (Signed)
EPIC Encounter for ICM Monitoring  Patient Name: Gary Williamson is a 54 y.o. male Date: 06/26/2019 Primary Care Physican: [name], MD Primary Cardiologist: [name] Electrophysiologist: [name] Bi-V Pacing: >99% 2/23/2021Weight:330 lbs   Spoke with patient and he is feeling well.  He denies fluid accumulation.   CorvueThoracic impedancesuggesting possible ongoing fluid accumulation since 06/20/2019.  Prescribed:Furosemide 40 mg 1 tabletevery morning and 0.5 tablet (20 mg total) every afternoon.  Labs: 05/11/2019 Creatinine 0.88, BUN 12, Potassium 4.1, Sodium 141, GFR 97-112 A complete set of results can be found in Results Review.  Recommendations: Advised to take Furosemide 40 mg bid x 4-5 days and then return to 40 mg every AM and 20 mg every afternoon.    Encouraged to review foods choices to determine if are high in salt which may contribute to fluid accumulation.   Follow-up plan: ICM clinic phone appointment on3/06/2019 to recheck fluid levels. 91 day device clinic remote transmission 08/24/2019.   Copy of ICM check sent to Dr.[name].  3 month ICM trend: 06/25/2019    1 Year ICM trend:       [name], RN 06/26/2019 12:51 PM

## 2019-06-28 ENCOUNTER — Other Ambulatory Visit (HOSPITAL_COMMUNITY): Payer: Self-pay | Admitting: Cardiology

## 2019-06-28 DIAGNOSIS — I428 Other cardiomyopathies: Secondary | ICD-10-CM

## 2019-06-30 NOTE — Progress Notes (Signed)
Make sure he has CHF clinic followup.

## 2019-07-02 ENCOUNTER — Telehealth (HOSPITAL_COMMUNITY): Payer: Self-pay | Admitting: Vascular Surgery

## 2019-07-02 NOTE — Telephone Encounter (Signed)
Left pt message to make f/u w/ np/pa , pt has not been seen in 2 years

## 2019-07-03 ENCOUNTER — Ambulatory Visit (INDEPENDENT_AMBULATORY_CARE_PROVIDER_SITE_OTHER): Payer: BC Managed Care – PPO

## 2019-07-03 DIAGNOSIS — Z95810 Presence of automatic (implantable) cardiac defibrillator: Secondary | ICD-10-CM

## 2019-07-03 DIAGNOSIS — I5022 Chronic systolic (congestive) heart failure: Secondary | ICD-10-CM

## 2019-07-03 NOTE — Progress Notes (Signed)
EPIC Encounter for ICM Monitoring  Patient Name: Gary Williamson is a 54 y.o. male Date: 07/03/2019 Primary Care Physican: [name], MD Primary Cardiologist: [name] Electrophysiologist: [name] Bi-V Pacing: >99% 2/23/2021Weight:330 lbs  Spoke with patient and reports feeling well at this time.  Denies fluid symptoms.    CorvueThoracic impedancereturned to normal since 06/25/19 remote transmission and taking extra Furosemide.  Prescribed:Furosemide 40 mg 1 tabletevery morning and 0.5 tablet (20 mg total) every afternoon.  Labs: 05/11/2019 Creatinine0.88, BUN12, Potassium4.1, Sodium141, [id number] A complete set of results can be found in Results Review.  Recommendations: No changes and encouraged to call if experiencing any fluid symptoms.  Follow-up plan: ICM clinic phone appointment on3/29/2021. 91 day device clinic remote transmission 08/24/2019. Office appt 07/17/2019 with HF clinic PA/NP.    Copy of ICM check sent to Dr. [name].   3 month ICM trend: 07/03/2019    1 Year ICM trend:       [name], RN 07/03/2019 10:28 AM

## 2019-07-08 ENCOUNTER — Ambulatory Visit: Payer: BC Managed Care – PPO | Attending: Internal Medicine

## 2019-07-08 DIAGNOSIS — Z23 Encounter for immunization: Secondary | ICD-10-CM | POA: Insufficient documentation

## 2019-07-08 NOTE — Progress Notes (Signed)
   Covid-19 Vaccination Clinic  Name:  Gary Williamson    MRN: [id number] DOB: 1965-09-16  07/08/2019  Gary Williamson was observed post Covid-19 immunization for 15 minutes without incident. He was provided with Vaccine Information Sheet and instruction to access the V-Safe system.   Gary Williamson was instructed to call 911 with any severe reactions post vaccine: Marland Kitchen Difficulty breathing  . Swelling of face and throat  . A fast heartbeat  . A bad rash all over body  . Dizziness and weakness   Immunizations Administered    Name Date Dose VIS Date Route   Pfizer COVID-19 Vaccine 07/08/2019  4:16 PM 0.3 mL 04/13/2019 Intramuscular   Manufacturer: [place]   Lot: [id number]   [place]: [id number]

## 2019-07-11 ENCOUNTER — Ambulatory Visit: Payer: Self-pay | Admitting: Family Medicine

## 2019-07-13 ENCOUNTER — Encounter: Payer: Self-pay | Admitting: Family Medicine

## 2019-07-13 ENCOUNTER — Ambulatory Visit (INDEPENDENT_AMBULATORY_CARE_PROVIDER_SITE_OTHER): Payer: BC Managed Care – PPO | Admitting: Family Medicine

## 2019-07-13 ENCOUNTER — Other Ambulatory Visit: Payer: Self-pay

## 2019-07-13 VITALS — BP 122/78 | HR 78 | Temp 97.7°F | Resp 17 | Ht 73.0 in | Wt 355.6 lb

## 2019-07-13 DIAGNOSIS — I10 Essential (primary) hypertension: Secondary | ICD-10-CM | POA: Diagnosis not present

## 2019-07-13 DIAGNOSIS — E7439 Other disorders of intestinal carbohydrate absorption: Secondary | ICD-10-CM | POA: Diagnosis not present

## 2019-07-13 DIAGNOSIS — I5022 Chronic systolic (congestive) heart failure: Secondary | ICD-10-CM

## 2019-07-13 MED ORDER — FUROSEMIDE 40 MG PO TABS: ORAL_TABLET | ORAL | 1 refills | Status: DC

## 2019-07-13 NOTE — Progress Notes (Signed)
Established Patient Office Visit  Subjective:  Patient ID: Gary Williamson, male    DOB: 1966/01/02  Age: 54 y.o. MRN: [id number]  CC:  Chief Complaint  Patient presents with  . Hypertension    f/u  . Medication Refill    furosemide and entresto    HPI [facility] presents for   Hypertension: Patient here for follow-up of elevated blood pressure. He is exercising and is adherent to low salt diet.  Blood pressure is well controlled at home. Cardiac symptoms none. Patient denies chest pain, chest pressure/discomfort, claudication, dyspnea and exertional chest pressure/discomfort.  Cardiovascular risk factors: hypertension, male gender and obesity (BMI >= 30 kg/m2). Use of agents associated with hypertension: none. History of target organ damage: heart failure. BP Readings from Last 3 Encounters:  07/13/19 122/78  01/19/19 132/84  09/08/18 (!) 158/83   CHF/OSA Pt states that he is working on his diet He states that he is compliant with his medications Denies fluid retention, shortness of breath or fatigue He had a sleep study in 2015 but states that he cannot tolerate the cpap due to discomfort  Prediabetes and Obesity He states that he tries to eat the right foods and avoid low starch diet He sometimes has a sweet tooth He is exercising by walking Lab Results  Component Value Date   HGBA1C 5.9 (H) 05/11/2019   Wt Readings from Last 3 Encounters:  07/13/19 (!) 355 lb 9.6 oz (161.3 kg)  05/07/19 (!) 341 lb (154.7 kg)  01/19/19 (!) 356 lb (161.5 kg)    Depression screen [facility] 2/9 07/13/2019 05/07/2019 09/08/2018 11/23/2017 07/15/2017  Decreased Interest 0 0 0 0 0  Down, Depressed, Hopeless 0 0 0 0 0  PHQ - 2 Score 0 0 0 0 0  Altered sleeping - - - - -  Tired, decreased energy - - - - -  Change in appetite - - - - -  Feeling bad or failure about yourself  - - - - -  Trouble concentrating - - - - -  Moving slowly or fidgety/restless - - - - -  Suicidal thoughts - - - - -    PHQ-9 Score - - - - -    Past Medical History:  Diagnosis Date  . Acute idiopathic pericarditis 03/24/2010   Overview:  Viral Idiopathic Pericarditis   . Chronic systolic heart failure ([place])   . Hypertension   . LBBB (left bundle branch block)    Chronic  . Leg edema   . NICM (nonischemic cardiomyopathy) ([place])    Probably nonischemic; LHC in 2002/03 no sig CAD per report (done in [place]). Cardiomyopathy for 9-10 yrs. 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 Echo 5/11: severe LVE, EF <20%, diff HK, mod dias dys, sev. LAE. Hosp for CHF in 5/11. Ad. MV 6/11: EF 21%, mild per-infart ischemia. LHC (7/11): EF 15%, no CAD, LVEDP 22; Echo (10/11): EF 20-25%;  Echo (12/12):  EF 20%, mod LVH, Gr 1 DD, diff HK, mild LAE  . OSA (obstructive sleep apnea)    cpap used  . Pure hypercholesterolemia   . Severe obesity ([place])   . Vitamin D deficiency     Past Surgical History:  Procedure Laterality Date  . BI-VENTRICULAR IMPLANTABLE CARDIOVERTER DEFIBRILLATOR N/A 08/21/2013   Procedure: BI-VENTRICULAR IMPLANTABLE CARDIOVERTER DEFIBRILLATOR  (CRT-D);  Surgeon: Coralyn Mark, MD;  Location: [facility] CATH LAB;  Service: Cardiovascular;  Laterality: N/A;  . CARDIAC CATHETERIZATION  2002 or 2003  . COLONOSCOPY N/A  09/16/2014   Procedure: COLONOSCOPY;  Surgeon: [name], MD;  Location: [facility];  Service: Endoscopy;  Laterality: N/A;  . LEFT HEART CATH  11/13/09   Dr [name]    Family History  Problem Relation Age of Onset  . Hypertension Mother   . Alzheimer's disease Mother   . Hyperlipidemia Mother   . Breast cancer Mother   . Cancer Mother        breast cancer  . Stroke Maternal Grandmother   . Arthritis Father   . Dementia Father   . Hypertension Father   . Alzheimer's disease Father   . COPD Brother   . Coronary artery disease Neg Hx   . Heart failure Neg Hx   . Heart disease Neg Hx     Social History   Socioeconomic History  . Marital status: Married    Spouse name: [name]  .  Number of children: 2  . Years of education: Not on file  . Highest education level: Not on file  Occupational History  . 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 Smoking status: Never Smoker  . Smokeless tobacco: Never Used  Substance and Sexual Activity  . Alcohol use: Yes    Alcohol/week: 0.0 - 2.0 standard drinks    Comment: rare to occ.  . Drug use: No  . Sexual activity: Yes    Birth control/protection: Condom  Other Topics Concern  . Not on file  Social History Narrative   Marital status: married x 17 years; happily married      Children: 2 children (15,12)      Lives in [place] with his wife and 2 children      Employment: [employer]; Solicitor x 12 years; likes work.  Lots of traveling.       Tobacco: none      Alcohol:  1 drink per week      Exercise:  Walking three days per week; 20 minutes.       Seatbelt: 100%   Social Determinants of Health   Financial Resource Strain:   . Difficulty of Paying Living Expenses:   Food Insecurity:   . Worried About Charity fundraiser in the Last Year:   . Arboriculturist in the Last Year:   Transportation Needs:   . Film/video editor (Medical):   Marland Kitchen Lack of Transportation (Non-Medical):   Physical Activity:   . Days of Exercise per Week:   . Minutes of Exercise per Session:   Stress:   . Feeling of Stress :   Social Connections:   . Frequency of Communication with Friends and Family:   . Frequency of Social Gatherings with Friends and Family:   . Attends Religious Services:   . Active Member of Clubs or Organizations:   . Attends Archivist Meetings:   Marland Kitchen Marital Status:   Intimate Partner Violence:   . Fear of Current or Ex-Partner:   . Emotionally Abused:   Marland Kitchen Physically Abused:   . Sexually Abused:     Outpatient Medications Prior to Visit  Medication Sig Dispense Refill  . aspirin 81 MG EC tablet Take 1  tablet (81 mg total) by mouth daily. 30 tablet 2  . carvedilol (COREG) 25 MG tablet TAKE 1 TABLET BY MOUTH TWICE A DAY 180 tablet 3  . Cholecalciferol (VITAMIN D) 2000 units CAPS  Take by mouth daily.    . fish oil-omega-3 fatty acids 1000 MG capsule Take 1 capsule (1 g total) by mouth daily. 30 capsule 2  . Multiple Vitamin (MULTIVITAMIN) tablet Take 1 tablet by mouth daily.      . sacubitril-valsartan (ENTRESTO) 97-103 MG Take 1 tablet by mouth 2 (two) times daily. LAST REFILL WITHOUT OFFICE VISIT PLEASE CALL [phone] 60 tablet 0  . simvastatin (ZOCOR) 40 MG tablet Take 1 tablet (40 mg total) by mouth every evening. 90 tablet 3  . spironolactone (ALDACTONE) 25 MG tablet Take 1 tablet (25 mg total) by mouth daily. 90 tablet 1  . furosemide (LASIX) 40 MG tablet TAKE ONE TABLET BY MOUTH EVERY MORNING AND ONE-HALF TABLET EVERY AFTERNOON. OFFICE VISIT NEEDED. 135 tablet 0   No facility-administered medications prior to visit.    No Known Allergies  ROS Review of Systems Review of Systems  Constitutional: Negative for activity change, appetite change, chills and fever.  HENT: Negative for congestion, nosebleeds, trouble swallowing and voice change.   Respiratory: Negative for cough, shortness of breath and wheezing.   Gastrointestinal: Negative for diarrhea, nausea and vomiting.  Genitourinary: Negative for difficulty urinating, dysuria, flank pain and hematuria.  Musculoskeletal: Negative for back pain, joint swelling and neck pain.  Neurological: Negative for dizziness, speech difficulty, light-headedness and numbness.  See HPI. All other review of systems negative.     Objective:    Physical Exam  BP 122/78 (BP Location: Left Arm, Patient Position: Sitting, Cuff Size: Large)   Pulse 78   Temp 97.7 F (36.5 C) (Temporal)   Resp 17   Ht 6\' 1"  (1.854 m)   Wt (!) 355 lb 9.6 oz (161.3 kg)   SpO2 97%   BMI 46.92 kg/m  Wt Readings from Last 3 Encounters:  07/13/19 (!) 355 lb 9.6  oz (161.3 kg)  05/07/19 (!) 341 lb (154.7 kg)  01/19/19 (!) 356 lb (161.5 kg)   Physical Exam  Constitutional: Oriented to person, place, and time. Appears well-developed and well-nourished.  HENT:  Head: Normocephalic and atraumatic.  Eyes: Conjunctivae and EOM are normal.  Cardiovascular: Normal rate, regular rhythm, normal heart sounds and intact distal pulses.  No murmur heard. Pulmonary/Chest: Effort normal and breath sounds normal. No stridor. No respiratory distress. Has no wheezes.  Neurological: Is alert and oriented to person, place, and time.  Skin: Skin is warm. Capillary refill takes less than 2 seconds.  Psychiatric: Has a normal mood and affect. Behavior is normal. Judgment and thought content normal.    Health Maintenance Due  Topic Date Due  . INFLUENZA VACCINE  12/02/2018    There are no preventive care reminders to display for this patient.  Lab Results  Component Value Date   TSH 3.780 07/25/2018   Lab Results  Component Value Date   WBC 9.7 05/11/2019   HGB 13.6 05/11/2019   HCT 39.8 05/11/2019   MCV 87 05/11/2019   PLT 215 05/11/2019   Lab Results  Component Value Date   NA 141 05/11/2019   K 4.1 05/11/2019   CO2 23 05/11/2019   GLUCOSE 132 (H) 05/11/2019   BUN 12 05/11/2019   CREATININE 0.88 05/11/2019   BILITOT 0.2 05/11/2019   ALKPHOS 77 05/11/2019   AST 19 05/11/2019   ALT 22 05/11/2019   PROT 6.2 05/11/2019   ALBUMIN 3.8 05/11/2019   CALCIUM 8.8 05/11/2019   ANIONGAP 8 12/30/2017   GFR 78.03 10/01/2013   Lab Results  Component  Value Date   CHOL 159 05/11/2019   Lab Results  Component Value Date   HDL 26 (L) 05/11/2019   Lab Results  Component Value Date   LDLCALC 104 (H) 05/11/2019   Lab Results  Component Value Date   TRIG 165 (H) 05/11/2019   Lab Results  Component Value Date   CHOLHDL 6.1 (H) 05/11/2019   Lab Results  Component Value Date   HGBA1C 5.9 (H) 05/11/2019      Assessment & Plan:   Problem List  Items Addressed This Visit      Cardiovascular and Mediastinum   Chronic systolic heart failure ([place])  - discussed that with his heart failure he should get a repeat sleep study.  He should discuss this with Cardiology   Relevant Medications   furosemide (LASIX) 40 MG tablet   Essential hypertension - bp stable on current meds   Relevant Medications   furosemide (LASIX) 40 MG tablet    Other Visit Diagnoses    Glucose intolerance    -  Primary Advised pt to adhere to a diabetic diet restricting starch and sugars   Morbid obesity ([place])    - stable, advised him to continue exercise      Meds ordered this encounter  Medications  . furosemide (LASIX) 40 MG tablet    Sig: Take one tablet by mouth every morning then half tablet by mouth in the afternoon.    Dispense:  135 tablet    Refill:  1    Follow-up: No follow-ups on file.    [name], MD

## 2019-07-13 NOTE — Patient Instructions (Addendum)
   If you have lab work done today you will be contacted with your lab results within the next 2 weeks.  If you have not heard from us then please contact us. The fastest way to get your results is to register for My Chart.   IF you received an x-ray today, you will receive an invoice from [facility]. Please contact [facility] at [phone] with questions or concerns regarding your invoice.   IF you received labwork today, you will receive an invoice from [facility]. Please contact [facility] at [phone] with questions or concerns regarding your invoice.   Our billing staff will not be able to assist you with questions regarding bills from these companies.  You will be contacted with the lab results as soon as they are available. The fastest way to get your results is to activate your My Chart account. Instructions are located on the last page of this paperwork. If you have not heard from us regarding the results in 2 weeks, please contact this office.     Heart Failure, Self Care Heart failure is a serious condition. This document explains the things you need to do to take care of yourself after a heart failure diagnosis. You may be asked to change your diet, take certain medicines, and make other lifestyle changes in order to stay as healthy as possible. Your health care provider may also give you more specific instructions. If you have problems or questions, contact your health care provider. What are the risks? Having heart failure puts you at higher risk for certain problems. These problems can get worse if you do not take good care of yourself. Problems may include:  Blood clotting problems. This may cause a stroke.  Damage to the kidneys, liver, or lungs.  Abnormal heart rhythms. Supplies needed:  Scale for monitoring weight.  Blood pressure monitor.  Notebook.  Medicines. How to care for yourself when you have heart failure Medicines Take  over-the-counter and prescription medicines only as told by your health care provider. Medicines reduce the workload of your heart, slow the progression of heart failure, and improve symptoms. Take your medicines every day.  Do not stop taking your medicine unless your health care provider tells you to do so.  Do not skip any dose of medicine.  Refill your prescriptions before you run out of medicine. Eating and drinking   Eat heart-healthy foods. Talk with a dietitian to make an eating plan that is right for you. ? Choose foods that contain no trans fat and are low in saturated fat and cholesterol. Healthy choices include fresh or frozen fruits and vegetables, fish, lean meats, legumes, fat-free or low-fat dairy products, and whole-grain or high-fiber foods. ? Limit salt (sodium) if told by your health care provider. Sodium restriction may reduce symptoms of heart failure. Ask a dietitian to recommend heart-healthy seasonings. ? Use healthy cooking methods instead of frying. Healthy methods include roasting, grilling, broiling, baking, poaching, steaming, and stir-frying.  Limit your fluid intake, if directed by your health care provider. Fluid restriction may reduce symptoms of heart failure. Alcohol use  Do not drink alcohol if: ? Your health care provider tells you not to drink. ? Your heart was damaged by alcohol, or you have severe heart failure. ? You are pregnant, may be pregnant, or are planning to become pregnant.  If you drink alcohol: ? Limit how much you use to:  0-1 drink a day for women.  0-2 drinks a day   for men. ? Be aware of how much alcohol is in your drink. In the [place] one drink equals one 12 oz bottle of beer (355 mL), one 5 oz glass of wine (148 mL), or one 1 oz glass of hard liquor (44 mL). Lifestyle   Do not use any products that contain nicotine or tobacco, such as cigarettes, e-cigarettes, and chewing tobacco. If you need help quitting, ask your health  care provider. ? Do not use nicotine gum or patches before talking to your health care provider.  Do not use illegal drugs.  Work with your health care provider to safely reach the right body weight.  Do physical activity if told by your health care provider. Talk to your health care provider before you begin an exercise if: ? You are an older adult. ? You have severe heart failure.  Learn to manage stress. If you need help to do this, ask your health care provider.  Participate in or seek rehabilitation as needed to keep or improve your independence and quality of life.  Plan rest periods when you get tired. Monitoring important information   Weigh yourself every day. This will help you to notice if too much fluid is building up in your body. ? Weigh yourself every morning after you urinate and before you eat breakfast. ? Wear the same amount of clothing each time you weigh yourself. ? Record your daily weight. Provide your health care provider with your weight record.  Monitor and record your pulse and blood pressure as told by your health care provider. Dealing with extreme temperatures  If the weather is extremely hot: ? Avoid vigorous physical activity. ? Use air conditioning or fans, or find a cooler location. ? Avoid caffeine and alcohol. ? Wear loose-fitting, lightweight, and light-colored clothing.  If the weather is extremely cold: ? Avoid vigorous activity. ? Layer your clothes. ? Wear mittens or gloves, a hat, and a scarf when you go outside. ? Avoid alcohol. Follow these instructions at home:  Stay up to date with vaccines. Pneumococcal and flu (influenza) vaccines are especially important in preventing infections of the airways.  Keep all follow-up visits as told by your health care provider. This is important. Contact a health care provider if you:  Have a rapid weight gain.  Have increasing shortness of breath.  Are unable to participate in your usual  physical activities.  Get tired easily.  Cough more than normal, especially with physical activity.  Lose your appetite or feel nauseous.  Have any swelling or more swelling in areas such as your hands, feet, ankles, or abdomen.  Are unable to sleep because it is hard to breathe.  Feel like your heart is beating quickly (palpitations).  Become dizzy or light-headed when you stand up. Get help right away if you:  Have trouble breathing.  Notice or your family notices a change in your awareness, such as having trouble staying awake or concentrating.  Have pain or discomfort in your chest.  Have an episode of fainting (syncope). These symptoms may represent a serious problem that is an emergency. Do not wait to see if the symptoms will go away. Get medical help right away. Call your local emergency services (911 in the [place] Do not drive yourself to the hospital. Summary  Heart failure is a serious condition. To care for yourself, you may be asked to change your diet, take certain medicines, and make other lifestyle changes.  Take your medicines every day. Do not   stop taking them unless your health care provider tells you to do so.  Eat heart-healthy foods, such as fresh or frozen fruits and vegetables, fish, lean meats, legumes, fat-free or low-fat dairy products, and whole-grain or high-fiber foods.  Ask your health care provider if you have any alcohol restrictions. You may have to stop drinking alcohol if you have severe heart failure.  Contact your health care provider if you notice problems, such as rapid weight gain or a fast heartbeat. Get help right away if you faint, or have chest pain or trouble breathing. This information is not intended to replace advice given to you by your health care provider. Make sure you discuss any questions you have with your health care provider. Document Revised: 08/01/2018 Document Reviewed: 08/02/2018 Elsevier Patient Education  2020  Elsevier Inc.  

## 2019-07-17 ENCOUNTER — Ambulatory Visit (HOSPITAL_COMMUNITY)
Admission: RE | Admit: 2019-07-17 | Discharge: 2019-07-17 | Disposition: A | Discharge status: Home / Self Care | Payer: BC Managed Care – PPO | Source: Ambulatory Visit | Attending: Cardiology | Admitting: Cardiology

## 2019-07-17 ENCOUNTER — Other Ambulatory Visit: Payer: Self-pay

## 2019-07-17 ENCOUNTER — Encounter (HOSPITAL_COMMUNITY): Payer: Self-pay

## 2019-07-17 VITALS — BP 142/98 | HR 71 | Ht 73.0 in | Wt 357.0 lb

## 2019-07-17 DIAGNOSIS — I5022 Chronic systolic (congestive) heart failure: Secondary | ICD-10-CM | POA: Insufficient documentation

## 2019-07-17 DIAGNOSIS — E669 Obesity, unspecified: Secondary | ICD-10-CM | POA: Insufficient documentation

## 2019-07-17 DIAGNOSIS — I428 Other cardiomyopathies: Secondary | ICD-10-CM | POA: Diagnosis not present

## 2019-07-17 DIAGNOSIS — I110 Hypertensive heart disease with heart failure: Secondary | ICD-10-CM | POA: Diagnosis not present

## 2019-07-17 DIAGNOSIS — E785 Hyperlipidemia, unspecified: Secondary | ICD-10-CM | POA: Diagnosis not present

## 2019-07-17 DIAGNOSIS — Z6841 Body Mass Index (BMI) 40.0 and over, adult: Secondary | ICD-10-CM | POA: Insufficient documentation

## 2019-07-17 DIAGNOSIS — Z95810 Presence of automatic (implantable) cardiac defibrillator: Secondary | ICD-10-CM | POA: Insufficient documentation

## 2019-07-17 DIAGNOSIS — I447 Left bundle-branch block, unspecified: Secondary | ICD-10-CM | POA: Diagnosis not present

## 2019-07-17 DIAGNOSIS — Z7982 Long term (current) use of aspirin: Secondary | ICD-10-CM | POA: Insufficient documentation

## 2019-07-17 DIAGNOSIS — Z8249 Family history of ischemic heart disease and other diseases of the circulatory system: Secondary | ICD-10-CM | POA: Diagnosis not present

## 2019-07-17 DIAGNOSIS — Z79899 Other long term (current) drug therapy: Secondary | ICD-10-CM | POA: Insufficient documentation

## 2019-07-17 LAB — BASIC METABOLIC PANEL
Anion gap: 11 (ref 5–15)
BUN: 9 mg/dL (ref 6–20)
CO2: 21 mmol/L — ABNORMAL LOW (ref 22–32)
Calcium: 8.6 mg/dL — ABNORMAL LOW (ref 8.9–10.3)
Chloride: 107 mmol/L (ref 98–111)
Creatinine, Ser: 1.06 mg/dL (ref 0.61–1.24)
GFR calc Af Amer: >60 mL/min (ref >60)
GFR calc non Af Amer: >60 mL/min (ref >60)
Glucose, Bld: 122 mg/dL — ABNORMAL HIGH (ref 70–99)
Potassium: 5 mmol/L (ref 3.5–5.1)
Sodium: 139 mmol/L (ref 135–145)

## 2019-07-17 MED ORDER — TORSEMIDE 20 MG PO TABS: 40.0000 mg | ORAL_TABLET | Freq: Every day | ORAL | 1 refills | Status: DC

## 2019-07-17 MED ORDER — ENTRESTO 97-103 MG PO TABS: 1.0000 | ORAL_TABLET | Freq: Two times a day (BID) | ORAL | 6 refills | Status: DC

## 2019-07-17 NOTE — Patient Instructions (Signed)
Stop Furosemide  Start Torsemide 40 mg (2 tabs) daily  Labs done today, your results will be available in MyChart, we will contact you for abnormal readings.  Your physician recommends that you schedule a follow-up appointment in: 10-14 days  If you have any questions or concerns before your next appointment please send [name] a message through [place] or call our office at [phone].  At the [facility], you and your health needs are our priority. As part of our continuing mission to provide you with exceptional heart care, we have created designated Provider Care Teams. These Care Teams include your primary Cardiologist (physician) and Advanced Practice Providers (APPs- Physician Assistants and Nurse Practitioners) who all work together to provide you with the care you need, when you need it.   You may see any of the following providers on your designated Care Team at your next follow up: Marland Kitchen Dr Glori Bickers . Dr [name] . Darrick Grinder, NP . Lyda Jester, PA . Audry Riles, PharmD   Please be sure to bring in all your medications bottles to every appointment.

## 2019-07-17 NOTE — Progress Notes (Signed)
Patient ID: Gary Williamson, male   DOB: 31-Mar-1966, 54 y.o.   MRN: [id number] PCP: Reginia Forts Cardiology: Dr. [name]  54 y.o. with a h/o nonischemic cardiomyopathy, LBBB, HTN, and NYHA Class II CHF presents for followup of CHF.  Echo in 12/14 showed that his EF remains 20-25%.  He had a St Jude CRT-D system placed in 4/15.  Last echo in 12/15 showed EF 25-30%, mild LV dilation, normal RV size and systolic function. Has not been seen in [facility] since 2018 but followed closely by device clinic who monitors his Corvue.   He was recently noted to be volume overloaded based on corvue analysis and was instructed to increase lasix to 40 mg bid x 5 days, then return to 40 mg qam + 20 mg qpm. He now presents back for f/u.   He did not notice any change in wt w/ Lasix increase. UOP is "ok" w/ Lasix but "not much". Corvue analysis continues to show decreased thoracic impedence c/w volume overload. Despite volume status, he denies any significant dyspnea. Can do basic ADLs w/o exertional symptoms. Denies orthopnea/PND.  He reports full med compliance. Eats out occasionally.    Corevue reviewed: Decreased thoracic impedance below threshold, 99% BiV-paced. No VT/VF     Labs (8/11): K 4.5, creatinine 1.3, LDL 79, HDL 26 Labs (2/12): K 4.3, creatinine 1.5 Labs (7/12): K 4.5, creatinine 1.2 Labs (12/14): K 4.4, creatinine 1.2, LDL 87, HDL 32 Labs (4/15): K 4.2, creatinine 1.03 Labs (03/25/14) : K 4.9 Creatinine 0.9 Cholesterol 161 TGL 104 HDL 31 LDL 109  Labs (4/16): K 4.4, creatinine 1.12, HCT 42.2, LDL 101, HDL 30 Labs (7/18): TSH mildly elevated, LDL 106, K 4.1, creatinine 0.98 Labs (1/21): Creatinine 0.88, K 4.1   Allergies (verified):  No Known Drug Allergies  Past Medical History: 1. Cardiomyopathy: Nonischemic.  He had a left heart cath in 2002 or 2003 with no significant coronary disease per his report (this was done in [place]). It sounds like his CMP has been present since about 2000.   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  Echo (5/11): severe LV dilation with EF < 20%, diffuse hypokinesis, moderate diastolic dysfunction, severe left atrial enlargement.  First hospitalization for CHF was in 5/11.  No drug use.  Adenosine myoview (6/11): EF 21%, diffuse hypokinesis, scar in the anterior and inferior walls and at the apex.  Mild peri-infarct ischemia.  LHC was done (7/11) showing EF 15%, global hypokinesis, LVEDP 22 mmHg, no angiographic CAD.  Echo (10/11): EF 20-25%, severely dilated LV with diffuse hypokinesis, mild MR.  Echo (12/14) with EF 20-25%, moderately dilated LV, diffuse hypokinesis, cannot rule out apical thrombus.  He was unable to tolerate cardiac MRI.  He had St Jude CRT-D implantation in 4/15. Echo (12/15) with EF 25-30%, mild LV dilation, mild LVH, normal RV size and systolic function.  2. Hypertension 3. Chronic left bundle branch block 4. Hyperlipidemia  Family History: Family history is negative for premature diagnosis of coronary artery disease or any arrhythmia or history of heart failure in first-degree relatives.  Mother and father do have a history of hypertension  Social History: The patient lives in [place] with his wife and 2 children.   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  He denies significant tobacco, EtOH, nor any illicit drug use, herbal medication  use, regular diet, and no regular exercise  Review of Systems  All systems reviewed and negative except as per HPI.   Current Outpatient Medications  Medication Sig Dispense Refill  . aspirin 81 MG EC tablet Take 1 tablet (81 mg total) by mouth daily. 30 tablet 2  . carvedilol (COREG) 25 MG tablet TAKE 1 TABLET BY MOUTH TWICE A DAY 180 tablet 3  . Cholecalciferol (VITAMIN D) 2000 units CAPS Take by mouth daily.    . fish oil-omega-3 fatty acids 1000 MG capsule Take 1 capsule (1 g total) by mouth daily. 30 capsule 2  . furosemide  (LASIX) 40 MG tablet Take one tablet by mouth every morning then half tablet by mouth in the afternoon. 135 tablet 1  . Multiple Vitamin (MULTIVITAMIN) tablet Take 1 tablet by mouth daily.      . sacubitril-valsartan (ENTRESTO) 97-103 MG Take 1 tablet by mouth 2 (two) times daily. LAST REFILL WITHOUT OFFICE VISIT PLEASE CALL [phone] 60 tablet 0  . simvastatin (ZOCOR) 40 MG tablet Take 1 tablet (40 mg total) by mouth every evening. 90 tablet 3  . spironolactone (ALDACTONE) 25 MG tablet Take 1 tablet (25 mg total) by mouth daily. 90 tablet 1   No current facility-administered medications for this encounter.    BP (!) 142/98   Pulse 71   Ht 6\' 1"  (1.854 m)   Wt (!) 161.9 kg (357 lb)   SpO2 98%   BMI 47.10 kg/m  PHYSICAL EXAM: General:  Well appearing, morbidly AAM. No respiratory difficulty HEENT: normal Neck: supple. Thick neck JVD assessment difficult. Carotids 2+ bilat; no bruits. No lymphadenopathy or thyromegaly appreciated. Cor: PMI nondisplaced. Regular rate & rhythm. No rubs, gallops or murmurs. Lungs: clear Abdomen: obese, mildly distended, nontender. No hepatosplenomegaly. No bruits or masses. Good bowel sounds. Extremities: no cyanosis, clubbing, rash, trace- 1+ bilateral LEE  Neuro: alert & oriented x 3, cranial nerves grossly intact. moves all 4 extremities w/o difficulty. Affect pleasant.   Assessment/Plan: 1. Chronic Systolic Heart Failure/Nonischemic cardiomyopathy:  - ECHO in 2016 w/ EF 25-30%. He now has a Research officer, political party CRT-D system (99% BiV pacing on interrogation, No VT/VF) - NYHA Class II-III. Volume assessment difficult due to obesity/ body habitus, but based on Optivol, he is fluid overloaded. Thoracic impedence below threshold. He had no significant change in wt/volume w/ Lasix increase w/ only modest UOP.   - Will d/c Lasix and change to torsemide, 40 mg daily. Check BMP today and again in 7-10 days.  - Continue Coreg 25 mg bid - Continue Entresto 97-103 bid  -  Continue Spironolactone 25 mg qd - Unable to tolerate Bidil due to headaches.   2. Obesity: Body mass index is 47.1 kg/m. - wt loss advised   F/u in 2 weeks   Matthias Bogus  PA-C 07/17/2019

## 2019-07-23 ENCOUNTER — Telehealth: Payer: BC Managed Care – PPO | Admitting: Nurse Practitioner

## 2019-07-23 ENCOUNTER — Ambulatory Visit: Payer: BC Managed Care – PPO | Attending: Internal Medicine

## 2019-07-23 DIAGNOSIS — Z20822 Contact with and (suspected) exposure to covid-19: Secondary | ICD-10-CM

## 2019-07-23 NOTE — Progress Notes (Signed)
erroneous encounter- patient submitted twice

## 2019-07-23 NOTE — Progress Notes (Signed)
E-Visit for Corona Virus Screening  Your current symptoms could be consistent with the coronavirus.  Many health care providers can now test patients at their office but not all are.  [place] has multiple testing sites. For information on our [place] testing locations and hours go to [email or web address]  We are enrolling you in our [place] for [place] . Daily you will receive a questionnaire within the [place] website. Our COVID 19 response team will be monitoring your responses daily.  Testing Information: The COVID-19 Community Testing sites will begin testing BY APPOINTMENT ONLY.  You can schedule online at [email or web address]  If you do not have access to a smart phone or computer you may call [phone] for an appointment.   Additional testing sites in the Community:  . For [facility] Testing sites in [facility]  FaceUpdate.uy  . For Pop-up testing sites in [place]  BowlDirectory.co.uk  . For Testing sites with regular hours [email or web address]  . For [place] [email or web address]  . For [facility] [email or web address]  . For [facility] testing in [place] and [employer] [email or web address]  . For [facility] testing in [facility]   [email or web address].[email or web address]  For  more information about community testing call [phone]   Please quarantine yourself while awaiting your test results. Please stay home for a minimum of 10 days from the first day of illness with improving symptoms and you have had 24 hours of no fever (without the use of Tylenol (Acetaminophen)  Motrin (Ibuprofen) or any fever reducing medication).  Also - Do not get tested prior to returning to work because once you have had a positive test the test can stay positive for more then a month in some cases.   You should wear a mask or cloth face covering over your nose and mouth if you must be around other people or animals, including pets (even at home). Try to stay at least 6 feet away from other people. This will protect the people around you.  Please continue good preventive care measures, including:  frequent hand-washing, avoid touching your face, cover coughs/sneezes, stay out of crowds and keep a 6 foot distance from others.  COVID-19 is a respiratory illness with symptoms that are similar to the flu. Symptoms are typically mild to moderate, but there have been cases of severe illness and death due to the virus.   The following symptoms may appear 2-14 days after exposure: . Fever . Cough . Shortness of breath or difficulty breathing . Chills . Repeated shaking with chills . Muscle pain . Headache . Sore throat . New loss of taste or smell . Fatigue . Congestion or runny nose . Nausea or vomiting . Diarrhea  Go to the nearest hospital ED for assessment if fever/cough/breathlessness are severe or illness seems like a threat to life.  It is vitally important that if you feel that you have an infection such as this virus or any other virus that you stay home and away from places where you may spread it to others.  You should avoid contact with people age 67 and older.   You can use medication such as delsym if cough develops  You may also take acetaminophen (Tylenol) as needed for fever.  Reduce your risk of any infection by using the same precautions used for avoiding the common cold or flu:  Marland Kitchen Wash your hands often with soap and warm water for at least 20 seconds.  If soap and water are  not readily available, use an alcohol-based hand sanitizer with at least 60% alcohol.  . If  coughing or sneezing, cover your mouth and nose by coughing or sneezing into the elbow areas of your shirt or coat, into a tissue or into your sleeve (not your hands). . Avoid shaking hands with others and consider head nods or verbal greetings only. . Avoid touching your eyes, nose, or mouth with unwashed hands.  . Avoid close contact with people who are sick. . Avoid places or events with large numbers of people in one location, like concerts or sporting events. . Carefully consider travel plans you have or are making. . If you are planning any travel outside or inside the [name], visit the CDC's Travelers' Health webpage for the latest health notices. . If you have some symptoms but not all symptoms, continue to monitor at home and seek medical attention if your symptoms worsen. . If you are having a medical emergency, call 911.  HOME CARE . Only take medications as instructed by your medical team. . Drink plenty of fluids and get plenty of rest. . A steam or ultrasonic humidifier can help if you have congestion.   GET HELP RIGHT AWAY IF YOU HAVE EMERGENCY WARNING SIGNS** FOR COVID-19. If you or someone is showing any of these signs seek emergency medical care immediately. Call 911 or proceed to your closest emergency facility if: . You develop worsening high fever. . Trouble breathing . Bluish lips or face . Persistent pain or pressure in the chest . New confusion . Inability to wake or stay awake . You cough up blood. . Your symptoms become more severe  **This list is not all possible symptoms. Contact your medical provider for any symptoms that are sever or concerning to you.  MAKE SURE YOU   Understand these instructions.  Will watch your condition.  Will get help right away if you are not doing well or get worse.  Your e-visit answers were reviewed by a board certified advanced clinical practitioner to complete your personal care plan.  Depending on the condition, your plan  could have included both over the counter or prescription medications.  If there is a problem please reply once you have received a response from your provider.  Your safety is important to [name].  If you have drug allergies check your prescription carefully.    You can use MyChart to ask questions about today's visit, request a non-urgent call back, or ask for a work or school excuse for 24 hours related to this e-Visit. If it has been greater than 24 hours you will need to follow up with your provider, or enter a new e-Visit to address those concerns. You will get an e-mail in the next two days asking about your experience.  I hope that your e-visit has been valuable and will speed your recovery. Thank you for using e-visits.   5-10 minutes spent reviewing and documenting in chart.

## 2019-07-24 LAB — NOVEL CORONAVIRUS, NAA: SARS-CoV-2, NAA: NOT DETECTED

## 2019-07-24 LAB — SARS-COV-2, NAA 2 DAY TAT

## 2019-07-29 ENCOUNTER — Other Ambulatory Visit: Payer: Self-pay

## 2019-07-29 ENCOUNTER — Ambulatory Visit: Payer: Self-pay | Attending: Internal Medicine

## 2019-07-29 DIAGNOSIS — Z23 Encounter for immunization: Secondary | ICD-10-CM

## 2019-07-29 NOTE — Progress Notes (Signed)
   Covid-19 Vaccination Clinic  Name:  Gary Williamson    MRN: [id number] DOB: 17-Jan-1966  07/29/2019  Gary Williamson was observed post Covid-19 immunization for 15 minutes without incident. He was provided with Vaccine Information Sheet and instruction to access the V-Safe system.   Gary Williamson was instructed to call 911 with any severe reactions post vaccine: Marland Kitchen Difficulty breathing  . Swelling of face and throat  . A fast heartbeat  . A bad rash all over body  . Dizziness and weakness   Immunizations Administered    Name Date Dose VIS Date Route   Pfizer COVID-19 Vaccine 07/29/2019  1:57 PM 0.3 mL 04/13/2019 Intramuscular   Manufacturer: [place]   Lot: [id number]   [place]: [id number]

## 2019-07-30 ENCOUNTER — Ambulatory Visit (INDEPENDENT_AMBULATORY_CARE_PROVIDER_SITE_OTHER): Payer: BC Managed Care – PPO

## 2019-07-30 DIAGNOSIS — I5022 Chronic systolic (congestive) heart failure: Secondary | ICD-10-CM | POA: Diagnosis not present

## 2019-07-30 DIAGNOSIS — Z95810 Presence of automatic (implantable) cardiac defibrillator: Secondary | ICD-10-CM | POA: Diagnosis not present

## 2019-08-03 NOTE — Progress Notes (Signed)
EPIC Encounter for ICM Monitoring  Patient Name: Gary Williamson is a 54 y.o. male Date: 08/03/2019 Primary Care Physican: [name], MD Primary Cardiologist: [name] Electrophysiologist: [name] Bi-V Pacing: >99% 07/17/2019 OfficeWeight:357lbs  Transmission reviewed.  CorvueThoracic impedancenormal  Prescribed:Furosemide 40 mg 1 tabletevery morning and 0.5 tablet (20 mg total) every afternoon.  Labs: 07/17/2019 Creatinine 1.06, BUN 9,   Potassium 5.0. Sodium, 139, GFR >60 05/11/2019 Creatinine0.88, BUN12, Potassium4.1, Sodium141, [id number] A complete set of results can be found in Results Review.  Recommendations:None  Follow-up plan: ICM clinic phone appointment on5/07/2019. 91 day device clinic remote transmission4/23/2021.     Copy of ICM check sent to Dr. [name].   3 month ICM trend: 07/30/2019    1 Year ICM trend:       [name], RN 08/03/2019 10:01 AM

## 2019-08-09 ENCOUNTER — Ambulatory Visit (HOSPITAL_COMMUNITY)
Admission: RE | Admit: 2019-08-09 | Discharge: 2019-08-09 | Disposition: A | Discharge status: Home / Self Care | Payer: BC Managed Care – PPO | Source: Ambulatory Visit | Attending: Cardiology | Admitting: Cardiology

## 2019-08-09 ENCOUNTER — Other Ambulatory Visit: Payer: Self-pay

## 2019-08-09 ENCOUNTER — Telehealth (HOSPITAL_COMMUNITY): Payer: Self-pay

## 2019-08-09 VITALS — BP 130/82 | HR 87 | Wt 355.8 lb

## 2019-08-09 DIAGNOSIS — G4733 Obstructive sleep apnea (adult) (pediatric): Secondary | ICD-10-CM

## 2019-08-09 DIAGNOSIS — E669 Obesity, unspecified: Secondary | ICD-10-CM | POA: Insufficient documentation

## 2019-08-09 DIAGNOSIS — E785 Hyperlipidemia, unspecified: Secondary | ICD-10-CM | POA: Insufficient documentation

## 2019-08-09 DIAGNOSIS — Z6841 Body Mass Index (BMI) 40.0 and over, adult: Secondary | ICD-10-CM | POA: Insufficient documentation

## 2019-08-09 DIAGNOSIS — I5022 Chronic systolic (congestive) heart failure: Secondary | ICD-10-CM

## 2019-08-09 DIAGNOSIS — Z7982 Long term (current) use of aspirin: Secondary | ICD-10-CM | POA: Insufficient documentation

## 2019-08-09 DIAGNOSIS — Z79899 Other long term (current) drug therapy: Secondary | ICD-10-CM | POA: Diagnosis not present

## 2019-08-09 DIAGNOSIS — Z8249 Family history of ischemic heart disease and other diseases of the circulatory system: Secondary | ICD-10-CM | POA: Insufficient documentation

## 2019-08-09 DIAGNOSIS — I428 Other cardiomyopathies: Secondary | ICD-10-CM | POA: Diagnosis not present

## 2019-08-09 DIAGNOSIS — Z95810 Presence of automatic (implantable) cardiac defibrillator: Secondary | ICD-10-CM | POA: Diagnosis not present

## 2019-08-09 DIAGNOSIS — I447 Left bundle-branch block, unspecified: Secondary | ICD-10-CM | POA: Diagnosis not present

## 2019-08-09 DIAGNOSIS — I110 Hypertensive heart disease with heart failure: Secondary | ICD-10-CM | POA: Insufficient documentation

## 2019-08-09 LAB — BASIC METABOLIC PANEL
Anion gap: 11 (ref 5–15)
BUN: 15 mg/dL (ref 6–20)
CO2: 26 mmol/L (ref 22–32)
Calcium: 8.8 mg/dL — ABNORMAL LOW (ref 8.9–10.3)
Chloride: 103 mmol/L (ref 98–111)
Creatinine, Ser: 1.15 mg/dL (ref 0.61–1.24)
GFR calc Af Amer: >60 mL/min (ref >60)
GFR calc non Af Amer: >60 mL/min (ref >60)
Glucose, Bld: 149 mg/dL — ABNORMAL HIGH (ref 70–99)
Potassium: 3.4 mmol/L — ABNORMAL LOW (ref 3.5–5.1)
Sodium: 140 mmol/L (ref 135–145)

## 2019-08-09 MED ORDER — DAPAGLIFLOZIN PROPANEDIOL 10 MG PO TABS: 10.0000 mg | ORAL_TABLET | Freq: Every day | ORAL | 5 refills | Status: DC

## 2019-08-09 NOTE — Patient Instructions (Signed)
START Farxiga 10mg  (1 tab) daily  Labs today and repeat in 1 week We will only contact you if something comes back abnormal or we need to make some changes. Otherwise no news is good news!  Your provider has recommended that you have a home sleep study.  [employer] is the company that does these test.  They will contact you by phone and must speak with you before they can ship the equipment.  Once they have spoken with you they will send the equipment right to your home with instructions on how to set it up.  Once you have completed the test you just dispose of the equipment, the information is automatically uploaded to [name] via blue-tooth technology.  IF you have any questions or issues with the equipment please call the company directly at [phone].  If your test is positive for sleep apnea and you need a home CPAP machine you will be contacted by Dr Theodosia Blender office [facility]) to set this up.  Your physician has requested that you have an echocardiogram. Echocardiography is a painless test that uses sound waves to create images of your heart. It provides your doctor with information about the size and shape of your heart and how well your heart's chambers and valves are working. This procedure takes approximately one hour. There are no restrictions for this procedure.  Your physician recommends that you schedule a follow-up appointment in: 3 months for an ECHO and visit with Dr [name]  Please call office at [phone] option 2 if you have any questions or concerns.   At the [facility], you and your health needs are our priority. As part of our continuing mission to provide you with exceptional heart care, we have created designated Provider Care Teams. These Care Teams include your primary Cardiologist (physician) and Advanced Practice Providers (APPs- Physician Assistants and Nurse Practitioners) who all work together to provide you with the care you need, when you need  it.   You may see any of the following providers on your designated Care Team at your next follow up: Marland Kitchen Dr Glori Bickers . Dr [name] . Darrick Grinder, NP . Lyda Jester, PA . Audry Riles, PharmD   Please be sure to bring in all your medications bottles to every appointment.

## 2019-08-09 NOTE — Progress Notes (Signed)
Patient ID: Gary Williamson, male   DOB: 1966-01-13, 54 y.o.   MRN: [id number] PCP: Dr Merlyn Lot  Cardiology: Dr. [name]  54 y.o. with a h/o nonischemic cardiomyopathy, LBBB, HTN, and NYHA Class II CHF presents for followup of CHF.  Echo in 12/14 showed that his EF remains 20-25%.  He had a St Jude CRT-D system placed in 4/15.  Last echo in 12/15 showed EF 25-30%, mild LV dilation, normal RV size and systolic function.  He was recently noted to be volume overloaded based on corvue analysis and was instructed to increase lasix to 40 mg bid x 5 days, then return to 40 mg qam + 20 mg qpm.  On 07/17/19  he was switched from lasix to torsemide.   Today he returns for HF follow up.Overall feeling fine. Mild dyspnea with steps. Denies PND/Orthopnea. No longer using CPAP and hasnt used in a few years. Says he stopped because he kept waking up. Appetite ok. No fever or chills. Weight at home 336-341  pounds. Taking all medications. Continues work full time in [place].   CorVue: Impedance trending down.   Labs (8/11): K 4.5, creatinine 1.3, LDL 79, HDL 26 Labs (2/12): K 4.3, creatinine 1.5 Labs (7/12): K 4.5, creatinine 1.2 Labs (12/14): K 4.4, creatinine 1.2, LDL 87, HDL 32 Labs (4/15): K 4.2, creatinine 1.03 Labs (03/25/14) : K 4.9 Creatinine 0.9 Cholesterol 161 TGL 104 HDL 31 LDL 109  Labs (4/16): K 4.4, creatinine 1.12, HCT 42.2, LDL 101, HDL 30 Labs (7/18): TSH mildly elevated, LDL 106, K 4.1, creatinine 0.98 Labs (1/21): Creatinine 0.88, K 4.1  Labs (07/17/19): K 5 Creatinine 1.06   Allergies (verified):  No Known Drug Allergies  Past Medical History: 1. Cardiomyopathy: Nonischemic.  He had a left heart cath in 2002 or 2003 with no significant coronary disease per his report (this was done in [place]). It sounds like his CMP has been present since about 2000.  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  Echo (5/11): severe LV dilation with EF < 20%, diffuse  hypokinesis, moderate diastolic dysfunction, severe left atrial enlargement.  First hospitalization for CHF was in 5/11.  No drug use.  Adenosine myoview (6/11): EF 21%, diffuse hypokinesis, scar in the anterior and inferior walls and at the apex.  Mild peri-infarct ischemia.  LHC was done (7/11) showing EF 15%, global hypokinesis, LVEDP 22 mmHg, no angiographic CAD.  Echo (10/11): EF 20-25%, severely dilated LV with diffuse hypokinesis, mild MR.  Echo (12/14) with EF 20-25%, moderately dilated LV, diffuse hypokinesis, cannot rule out apical thrombus.  He was unable to tolerate cardiac MRI.  He had St Jude CRT-D implantation in 4/15. Echo (12/15) with EF 25-30%, mild LV dilation, mild LVH, normal RV size and systolic function.  2. Hypertension 3. Chronic left bundle branch block 4. Hyperlipidemia  Family History: Family history is negative for premature diagnosis of coronary artery disease or any arrhythmia or history of heart failure in first-degree relatives.  Mother and father do have a history of hypertension  Social History: The patient lives in [place] with his wife and 2 children.   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  Current Outpatient Medications  Medication Sig Dispense Refill  . aspirin 81 MG EC tablet Take 1 tablet (81 mg total) by mouth daily. 30 tablet 2  . carvedilol (COREG) 25 MG tablet TAKE 1 TABLET BY MOUTH TWICE A DAY 180 tablet 3  . Cholecalciferol (VITAMIN D) 2000 units CAPS Take by mouth daily.    . fish oil-omega-3 fatty acids 1000 MG capsule Take 1 capsule (1 g total) by mouth daily. 30 capsule 2  . Multiple Vitamin (MULTIVITAMIN) tablet Take 1 tablet by mouth daily.      . sacubitril-valsartan (ENTRESTO) 97-103 MG Take 1 tablet by mouth 2  (two) times daily. 60 tablet 6  . simvastatin (ZOCOR) 40 MG tablet Take 1 tablet (40 mg total) by mouth every evening. 90 tablet 3  . spironolactone (ALDACTONE) 25 MG tablet Take 1 tablet (25 mg total) by mouth daily. 90 tablet 1  . torsemide (DEMADEX) 20 MG tablet Take 2 tablets (40 mg total) by mouth daily. 60 tablet 1   No current facility-administered medications for this encounter.    BP 130/82   Pulse 87   Wt (!) 161.4 kg (355 lb 12.8 oz)   SpO2 96%   BMI 46.94 kg/m   Wt Readings from Last 3 Encounters:  08/09/19 (!) 161.4 kg (355 lb 12.8 oz)  07/17/19 (!) 161.9 kg (357 lb)  07/13/19 (!) 161.3 kg (355 lb 9.6 oz)   General:  Walked in the clinic. No resp difficulty HEENT: normal Neck: supple. no JVD. Carotids 2+ bilat; no bruits. No lymphadenopathy or thryomegaly appreciated. Cor: PMI nondisplaced. Regular rate & rhythm. No rubs, gallops or murmurs. Lungs: clear Abdomen: obese, soft, nontender, nondistended. No hepatosplenomegaly. No bruits or masses. Good bowel sounds. Extremities: no cyanosis, clubbing, rash, edema Neuro: alert & orientedx3, cranial nerves grossly intact. moves all 4 extremities w/o difficulty. Affect pleasant   Assessment/Plan: 1. Chronic Systolic Heart Failure/Nonischemic cardiomyopathy:  - ECHO in 2016 w/ EF 25-30%. He now has a Research officer, political party CRT-D system (99% BiV pacing on interrogation, No VT/VF) - NYHA II-III- Corvue - impedance down but suspect this is due to diet with recent trip. Discussed low salt food choices.  - Volume status stable. Continue torsemide 40 mg daily - Continue Coreg 25 mg bid - Continue Entresto 97-103 bid  - Continue Spironolactone 25 mg qd - Unable to tolerate Bidil due to headaches.  - Add farxiga 10 mg daily.  - Check BMET today and in 7 days.   2. Obesity: Body mass index is 46.94 kg/m. - Discussed portion control.  3. OSA - Set up home sleep study. Has not used in several years.  - Needs to restart using CPAP. Will need  to sleep MD after results.   Follow up in 3 months with an ECHO and Dr Precious Bard  NP-C  08/09/2019

## 2019-08-09 NOTE — Progress Notes (Addendum)
Patient Name:  Gary Williamson         DOB: 1965/08/06      Height: 6'1     Weight: 355 lb  Office Name: [facility]         Referring Provider: Darrick Grinder, NP  Today's Date: 08/09/2019  Date:   STOP BANG RISK ASSESSMENT S (snore) Have you been told that you snore?     YES   T (tired) Are you often tired, fatigued, or sleepy during the day?   NO  O (obstruction) Do you stop breathing, choke, or gasp during sleep? NO   P (pressure) Do you have or are you being treated for high blood pressure? YES   B (BMI) Is your body index greater than 35 kg/m? YES   A (age) Are you 50 years old or older? YES   N (neck) Do you have a neck circumference greater than 16 inches?    NA   G (gender) Are you a male? YES   TOTAL STOP/BANG "YES" ANSWERS 5                                                                       For Office Use Only              Procedure Order Form    YES to 3+ Stop Bang questions OR two clinical symptoms - patient qualifies for WatchPAT (CPT 95800)     Submit: This Form + Patient Face Sheet + Clinical Note via CloudPAT or Fax: [phone]

## 2019-08-09 NOTE — Telephone Encounter (Signed)
LM for patient to return call regarding lab results.

## 2019-08-09 NOTE — Telephone Encounter (Signed)
-----   Message from Conrad , NP sent at 08/09/2019  3:53 PM EDT ----- Add 20 meq K daily. Potassium low.

## 2019-08-10 ENCOUNTER — Telehealth (HOSPITAL_COMMUNITY): Payer: Self-pay

## 2019-08-10 MED ORDER — POTASSIUM CHLORIDE CRYS ER 20 MEQ PO TBCR: 20.0000 meq | EXTENDED_RELEASE_TABLET | Freq: Every day | ORAL | 3 refills | Status: DC

## 2019-08-10 NOTE — Telephone Encounter (Signed)
-----   Message from Conrad [place], NP sent at 08/09/2019  3:53 PM EDT ----- Add 20 meq K daily. Potassium low.

## 2019-08-10 NOTE — Telephone Encounter (Signed)
Pt aware of results and advised to add kcl 20 meq daily. Verbalized understanding.

## 2019-08-13 ENCOUNTER — Telehealth (HOSPITAL_COMMUNITY): Payer: Self-pay

## 2019-08-13 NOTE — Telephone Encounter (Signed)
LATE ENTRY from 08/10/19 Pt aware of results and advised to add kcl 20 meq daily. Verbalized understanding.

## 2019-08-13 NOTE — Telephone Encounter (Signed)
Order, OV note, stop bang and demographics all faxed to [facility] at [phone]. Confirmation received.

## 2019-08-14 ENCOUNTER — Telehealth: Payer: Self-pay

## 2019-08-14 NOTE — Telephone Encounter (Signed)
Called pt. To inform them of Dr. Nolon Rod' up coming departure. Pt. Requested time to make decision on wether or not to stay with practice.

## 2019-08-16 ENCOUNTER — Other Ambulatory Visit: Payer: Self-pay

## 2019-08-16 ENCOUNTER — Ambulatory Visit (HOSPITAL_COMMUNITY)
Admission: RE | Admit: 2019-08-16 | Discharge: 2019-08-16 | Disposition: A | Discharge status: Home / Self Care | Payer: BC Managed Care – PPO | Source: Ambulatory Visit | Attending: Cardiology | Admitting: Cardiology

## 2019-08-16 DIAGNOSIS — I5022 Chronic systolic (congestive) heart failure: Secondary | ICD-10-CM

## 2019-08-16 LAB — BASIC METABOLIC PANEL
Anion gap: 10 (ref 5–15)
BUN: 23 mg/dL — ABNORMAL HIGH (ref 6–20)
CO2: 26 mmol/L (ref 22–32)
Calcium: 8.9 mg/dL (ref 8.9–10.3)
Chloride: 103 mmol/L (ref 98–111)
Creatinine, Ser: 1.23 mg/dL (ref 0.61–1.24)
GFR calc Af Amer: >60 mL/min (ref >60)
GFR calc non Af Amer: >60 mL/min (ref >60)
Glucose, Bld: 142 mg/dL — ABNORMAL HIGH (ref 70–99)
Potassium: 3.8 mmol/L (ref 3.5–5.1)
Sodium: 139 mmol/L (ref 135–145)

## 2019-08-20 ENCOUNTER — Telehealth: Payer: Self-pay | Admitting: *Deleted

## 2019-08-20 DIAGNOSIS — G4733 Obstructive sleep apnea (adult) (pediatric): Secondary | ICD-10-CM

## 2019-08-20 NOTE — Telephone Encounter (Signed)
[place]   Mr. Gary Williamson DOB [birth year] insurance will not cover the watchpat [phone]). I have told [name] to cancel the study.  Please order an in lab study and if insurance will not pay for that then he will need a regular home sleep study through the sleep lab     [name]

## 2019-08-20 NOTE — Telephone Encounter (Signed)
From: Freada Bergeron, CMA  Sent: 08/20/2019 10:44 AM EDT  To: [facility] Sleep Studies  Subject: precert                      Split night

## 2019-08-24 ENCOUNTER — Ambulatory Visit (INDEPENDENT_AMBULATORY_CARE_PROVIDER_SITE_OTHER): Payer: BC Managed Care – PPO | Admitting: *Deleted

## 2019-08-24 DIAGNOSIS — I428 Other cardiomyopathies: Secondary | ICD-10-CM | POA: Diagnosis not present

## 2019-08-25 LAB — CUP PACEART REMOTE DEVICE CHECK
Battery Remaining Longevity: 24 mo
Battery Remaining Percentage: 28 %
Battery Voltage: 2.86 V
Brady Statistic AP VP Percent: 2.3 %
Brady Statistic AP VS Percent: 1 %
Brady Statistic AS VP Percent: 97 %
Brady Statistic AS VS Percent: 1 %
Brady Statistic RA Percent Paced: 2.3 %
Date Time Interrogation Session: 20210424013019
HighPow Impedance: 64 Ohm
HighPow Impedance: 64 Ohm
Implantable Lead Implant Date: 20150421
Implantable Lead Implant Date: 20150421
Implantable Lead Implant Date: 20150421
Implantable Lead Location: 753857
Implantable Lead Location: 753859
Implantable Lead Location: 753860
Implantable Pulse Generator Implant Date: 20150421
Lead Channel Impedance Value: 380 Ohm
Lead Channel Impedance Value: 460 Ohm
Lead Channel Impedance Value: 810 Ohm
Lead Channel Pacing Threshold Amplitude: 0.625 V
Lead Channel Pacing Threshold Amplitude: 0.75 V
Lead Channel Pacing Threshold Amplitude: 0.75 V
Lead Channel Pacing Threshold Pulse Width: 0.5 ms
Lead Channel Pacing Threshold Pulse Width: 0.5 ms
Lead Channel Pacing Threshold Pulse Width: 0.5 ms
Lead Channel Sensing Intrinsic Amplitude: 4 mV
Lead Channel Sensing Intrinsic Amplitude: 7.4 mV
Lead Channel Setting Pacing Amplitude: 2 V
Lead Channel Setting Pacing Amplitude: 2 V
Lead Channel Setting Pacing Amplitude: 2 V
Lead Channel Setting Pacing Pulse Width: 0.5 ms
Lead Channel Setting Pacing Pulse Width: 0.5 ms
Lead Channel Setting Sensing Sensitivity: 0.5 mV
Pulse Gen Serial Number: 7157119

## 2019-08-26 LAB — CUP PACEART REMOTE DEVICE CHECK
Battery Remaining Longevity: 24 mo
Battery Remaining Percentage: 28 %
Battery Voltage: 2.86 V
Brady Statistic AP VP Percent: 2.3 %
Brady Statistic AP VS Percent: 1 %
Brady Statistic AS VP Percent: 97 %
Brady Statistic AS VS Percent: 1 %
Brady Statistic RA Percent Paced: 2.3 %
Date Time Interrogation Session: 20210425002740
HighPow Impedance: 68 Ohm
HighPow Impedance: 68 Ohm
Implantable Lead Implant Date: 20150421
Implantable Lead Implant Date: 20150421
Implantable Lead Implant Date: 20150421
Implantable Lead Location: 753857
Implantable Lead Location: 753859
Implantable Lead Location: 753860
Implantable Pulse Generator Implant Date: 20150421
Lead Channel Impedance Value: 350 Ohm
Lead Channel Impedance Value: 440 Ohm
Lead Channel Impedance Value: 810 Ohm
Lead Channel Pacing Threshold Amplitude: 0.625 V
Lead Channel Pacing Threshold Amplitude: 0.75 V
Lead Channel Pacing Threshold Amplitude: 0.75 V
Lead Channel Pacing Threshold Pulse Width: 0.5 ms
Lead Channel Pacing Threshold Pulse Width: 0.5 ms
Lead Channel Pacing Threshold Pulse Width: 0.5 ms
Lead Channel Sensing Intrinsic Amplitude: 3.8 mV
Lead Channel Sensing Intrinsic Amplitude: 7.4 mV
Lead Channel Setting Pacing Amplitude: 2 V
Lead Channel Setting Pacing Amplitude: 2 V
Lead Channel Setting Pacing Amplitude: 2 V
Lead Channel Setting Pacing Pulse Width: 0.5 ms
Lead Channel Setting Pacing Pulse Width: 0.5 ms
Lead Channel Setting Sensing Sensitivity: 0.5 mV
Pulse Gen Serial Number: 7157119

## 2019-08-27 NOTE — Progress Notes (Signed)
ICD Remote  

## 2019-09-03 ENCOUNTER — Ambulatory Visit (INDEPENDENT_AMBULATORY_CARE_PROVIDER_SITE_OTHER): Payer: BC Managed Care – PPO

## 2019-09-03 DIAGNOSIS — Z95810 Presence of automatic (implantable) cardiac defibrillator: Secondary | ICD-10-CM

## 2019-09-03 DIAGNOSIS — I5022 Chronic systolic (congestive) heart failure: Secondary | ICD-10-CM | POA: Diagnosis not present

## 2019-09-05 ENCOUNTER — Telehealth: Payer: Self-pay

## 2019-09-05 ENCOUNTER — Encounter: Payer: Self-pay | Admitting: Gastroenterology

## 2019-09-05 NOTE — Telephone Encounter (Signed)
Remote ICM transmission received.  Attempted call to patient regarding ICM remote transmission and left detailed message per DPR.  Advised to return call for any fluid symptoms or questions. Next ICM remote transmission scheduled 10/08/2019.     

## 2019-09-05 NOTE — Progress Notes (Signed)
EPIC Encounter for ICM Monitoring  Patient Name: Gary Williamson is a 54 y.o. male Date: 09/05/2019 Primary Care Physican: [name], MD Primary Cardiologist: [name] Electrophysiologist: [name] Bi-V Pacing: >99% 07/17/2019 OfficeWeight:357lbs  AT/AF Burden: <1% (taking Aspirin)   Attempted call to patient and unable to reach.  Left detailed message per DPR regarding transmission. Transmission reviewed.   CorvueThoracic impedancenormal  Prescribed:Furosemide 40 mg 1 tabletevery morning and 0.5 tablet (20 mg total) every afternoon.  Labs: 07/17/2019 Creatinine 1.06, BUN 9,   Potassium 5.0. Sodium, 139, GFR >60 05/11/2019 Creatinine0.88, BUN12, Potassium4.1, Sodium141, [id number] A complete set of results can be found in Results Review.  Recommendations: Left voice mail with ICM number and encouraged to call if experiencing any fluid symptoms.  Follow-up plan: ICM clinic phone appointment on6/11/2019. 91 day device clinic remote transmission7/23/2021.Office appointment with Dr [name] on 11/09/2019.   Copy of ICM check sent to Dr.[name].   3 month ICM trend: 09/03/2019    1 Year ICM trend:       [name], RN 09/05/2019 8:09 AM

## 2019-09-16 ENCOUNTER — Other Ambulatory Visit (HOSPITAL_COMMUNITY): Payer: Self-pay | Admitting: Cardiology

## 2019-09-28 NOTE — Telephone Encounter (Signed)
[employer]: No auth required.Marland KitchenMarland KitchenRef [id number].Marland KitchenMarland KitchenCAS

## 2019-10-04 NOTE — Addendum Note (Signed)
Addended by: Freada Bergeron on: 10/04/2019 11:50 AM   Modules accepted: Orders

## 2019-10-04 NOTE — Telephone Encounter (Signed)
Order placed for a regular home sleep study through the sleep lab.

## 2019-10-05 ENCOUNTER — Other Ambulatory Visit (HOSPITAL_COMMUNITY): Payer: BC Managed Care – PPO

## 2019-10-08 ENCOUNTER — Encounter (HOSPITAL_BASED_OUTPATIENT_CLINIC_OR_DEPARTMENT_OTHER): Payer: BC Managed Care – PPO | Admitting: Cardiology

## 2019-10-16 ENCOUNTER — Ambulatory Visit (INDEPENDENT_AMBULATORY_CARE_PROVIDER_SITE_OTHER): Payer: BC Managed Care – PPO

## 2019-10-16 DIAGNOSIS — Z95810 Presence of automatic (implantable) cardiac defibrillator: Secondary | ICD-10-CM

## 2019-10-19 ENCOUNTER — Telehealth: Payer: Self-pay

## 2019-10-19 NOTE — Progress Notes (Signed)
EPIC Encounter for ICM Monitoring  Patient Name: Gary Williamson is a 54 y.o. male Date: 10/19/2019 Primary Care Physican: [name], MD Primary Cardiologist: [name] Electrophysiologist: [name] Bi-V Pacing: >99% 3/16/2021OfficeWeight:357lbs  AT/AF Burden: <1% (taking Aspirin)   Attempted call to patient and unable to reach.  Left detailed message per DPR regarding transmission. Transmission reviewed.   CorvueThoracic impedancesuggesting return to normal on 6/17 but did have decreased impedance from 6/11-6/13  Prescribed:Furosemide 40 mg 1 tabletevery morning and 0.5 tablet (20 mg total) every afternoon.  Labs: 08/16/2019 Creatinine 1.23, BUN 23, Potassium 3.8, Sodium 139, GFR >60 08/09/2019 Creatinine 1.15, BUN 15, Potassium 3.4, Sodium 140, GFR >60  07/17/2019 Creatinine 1.06, BUN 9, Potassium 5.0. Sodium, 139, GFR >60 05/11/2019 Creatinine0.88, BUN12, Potassium4.1, Sodium141, [id number] A complete set of results can be found in Results Review.  Recommendations: Left voice mail with ICM number and encouraged to call if experiencing any fluid symptoms.  Follow-up plan: ICM clinic phone appointment on7/19/2021. 91 day device clinic remote transmission7/23/2021.Office appointment with Dr [name] on 11/09/2019.  Copy of ICM check sent to Dr.[name].  Direct Trend on 10/18/2019   3 month ICM trend: 10/16/2019    1 Year ICM trend:       [name], RN 10/19/2019 10:30 AM

## 2019-10-19 NOTE — Telephone Encounter (Signed)
  Remote ICM transmission received.  Attempted call to patient regarding ICM remote transmission and left detailed message per DPR.  Advised to return call for any fluid symptoms or questions. Next ICM remote transmission scheduled 11/19/2019.

## 2019-11-09 ENCOUNTER — Ambulatory Visit (HOSPITAL_COMMUNITY)
Admission: RE | Admit: 2019-11-09 | Discharge: 2019-11-09 | Disposition: A | Discharge status: Home / Self Care | Payer: BC Managed Care – PPO | Source: Ambulatory Visit | Attending: Cardiology | Admitting: Cardiology

## 2019-11-09 ENCOUNTER — Ambulatory Visit (HOSPITAL_BASED_OUTPATIENT_CLINIC_OR_DEPARTMENT_OTHER)
Admission: RE | Admit: 2019-11-09 | Discharge: 2019-11-09 | Disposition: A | Discharge status: Home / Self Care | Payer: BC Managed Care – PPO | Source: Ambulatory Visit | Attending: Cardiology | Admitting: Cardiology

## 2019-11-09 ENCOUNTER — Other Ambulatory Visit: Payer: Self-pay

## 2019-11-09 ENCOUNTER — Encounter (HOSPITAL_COMMUNITY): Payer: Self-pay | Admitting: Cardiology

## 2019-11-09 VITALS — BP 120/64 | HR 74 | Wt 356.0 lb

## 2019-11-09 DIAGNOSIS — I5022 Chronic systolic (congestive) heart failure: Secondary | ICD-10-CM | POA: Diagnosis present

## 2019-11-09 DIAGNOSIS — I428 Other cardiomyopathies: Secondary | ICD-10-CM | POA: Insufficient documentation

## 2019-11-09 DIAGNOSIS — Z8249 Family history of ischemic heart disease and other diseases of the circulatory system: Secondary | ICD-10-CM | POA: Diagnosis not present

## 2019-11-09 DIAGNOSIS — Z7982 Long term (current) use of aspirin: Secondary | ICD-10-CM | POA: Insufficient documentation

## 2019-11-09 DIAGNOSIS — Z95810 Presence of automatic (implantable) cardiac defibrillator: Secondary | ICD-10-CM | POA: Diagnosis not present

## 2019-11-09 DIAGNOSIS — R0683 Snoring: Secondary | ICD-10-CM

## 2019-11-09 DIAGNOSIS — I110 Hypertensive heart disease with heart failure: Secondary | ICD-10-CM | POA: Insufficient documentation

## 2019-11-09 DIAGNOSIS — E669 Obesity, unspecified: Secondary | ICD-10-CM | POA: Diagnosis not present

## 2019-11-09 DIAGNOSIS — I447 Left bundle-branch block, unspecified: Secondary | ICD-10-CM | POA: Insufficient documentation

## 2019-11-09 DIAGNOSIS — Z7984 Long term (current) use of oral hypoglycemic drugs: Secondary | ICD-10-CM | POA: Insufficient documentation

## 2019-11-09 DIAGNOSIS — Z79899 Other long term (current) drug therapy: Secondary | ICD-10-CM | POA: Insufficient documentation

## 2019-11-09 DIAGNOSIS — E785 Hyperlipidemia, unspecified: Secondary | ICD-10-CM | POA: Insufficient documentation

## 2019-11-09 LAB — BASIC METABOLIC PANEL
Anion gap: 11 (ref 5–15)
BUN: 16 mg/dL (ref 6–20)
CO2: 27 mmol/L (ref 22–32)
Calcium: 8.9 mg/dL (ref 8.9–10.3)
Chloride: 103 mmol/L (ref 98–111)
Creatinine, Ser: 1.13 mg/dL (ref 0.61–1.24)
GFR calc Af Amer: >60 mL/min (ref >60)
GFR calc non Af Amer: >60 mL/min (ref >60)
Glucose, Bld: 133 mg/dL — ABNORMAL HIGH (ref 70–99)
Potassium: 4.1 mmol/L (ref 3.5–5.1)
Sodium: 141 mmol/L (ref 135–145)

## 2019-11-09 LAB — LIPID PANEL
Cholesterol: 165 mg/dL (ref 0–200)
HDL: 29 mg/dL — ABNORMAL LOW (ref >40)
LDL Cholesterol: 108 mg/dL — ABNORMAL HIGH (ref 0–99)
Total CHOL/HDL Ratio: 5.7 RATIO
Triglycerides: 139 mg/dL (ref <150)
VLDL: 28 mg/dL (ref 0–40)

## 2019-11-09 NOTE — Patient Instructions (Signed)
It was great to see you today! No medication changes are needed at this time.  Labs today We will only contact you if something comes back abnormal or we need to make some changes. Otherwise no news is good news!  Labs needed in 3 months  Your physician has recommended that you have a sleep study. This test records several body functions during sleep, including: brain activity, eye movement, oxygen and carbon dioxide blood levels, heart rate and rhythm, breathing rate and rhythm, the flow of air through your mouth and nose, snoring, body muscle movements, and chest and belly movement.   Your physician recommends that you schedule a follow-up appointment in: 6 months with Dr [name]  Do the following things EVERYDAY: 1) Weigh yourself in the morning before breakfast. Write it down and keep it in a log. 2) Take your medicines as prescribed 3) Eat low salt foods--Limit salt (sodium) to 2000 mg per day.  4) Stay as active as you can everyday 5) Limit all fluids for the day to less than 2 liters  At the [facility], you and your health needs are our priority. As part of our continuing mission to provide you with exceptional heart care, we have created designated Provider Care Teams. These Care Teams include your primary Cardiologist (physician) and Advanced Practice Providers (APPs- Physician Assistants and Nurse Practitioners) who all work together to provide you with the care you need, when you need it.   You may see any of the following providers on your designated Care Team at your next follow up: Marland Kitchen Dr Glori Bickers . Dr [name] . Darrick Grinder, NP . Lyda Jester, PA . Audry Riles, PharmD   Please be sure to bring in all your medications bottles to every appointment.

## 2019-11-09 NOTE — Progress Notes (Signed)
°  Echocardiogram 2D Echocardiogram has been performed.  Gary Williamson 11/09/2019, 9:00 AM

## 2019-11-10 NOTE — Progress Notes (Signed)
Patient ID: Gary Williamson, male   DOB: 11/21/1965, 54 y.o.   MRN: [id number] PCP: Reginia Forts Cardiology: Dr. [name]  54 y.o. with a h/o nonischemic cardiomyopathy, LBBB, HTN, and NYHA Class II CHF presents for followup of CHF.  Echo in 12/14 showed that his EF remains 20-25%.  He had a St Jude CRT-D system placed in 4/15.  Echo in 12/15 showed EF 25-30%, mild LV dilation, normal RV size and systolic function.    Echo was done today and reviewed, EF up to 40-45%, diffuse hypokinesis, normal RV.   Weight is stable since last appointment.  He is walking in his neighborhood for about 20 minutes/day for exercise.  He has good exercise tolerance, no dyspnea walking on flat ground.  Mild dyspnea with stairs if steep.  He continues to work full time for [employer].  He has daytime sleepiness and snoring.   St Jude device interrogation: stable thoracic impedance, >99% BiV pacing  ECG (personally reviewed): NSR with BiV pacing.    Labs (8/11): K 4.5, creatinine 1.3, LDL 79, HDL 26 Labs (2/12): K 4.3, creatinine 1.5 Labs (7/12): K 4.5, creatinine 1.2 Labs (12/14): K 4.4, creatinine 1.2, LDL 87, HDL 32 Labs (4/15): K 4.2, creatinine 1.03 Labs (03/25/14) : K 4.9 Creatinine 0.9 Cholesterol 161 TGL 104 HDL 31 LDL 109  Labs (4/16): K 4.4, creatinine 1.12, HCT 42.2, LDL 101, HDL 30 Labs (7/18): TSH mildly elevated, LDL 106, K 4.1, creatinine 0.98 Labs (4/21): K 3.8, creatinine 1.23  Allergies (verified):  No Known Drug Allergies  Past Medical History: 1. Cardiomyopathy: Nonischemic.  He had a left heart cath in 2002 or 2003 with no significant coronary disease per his report (this was done in [place]). It sounds like his CMP has been present since about 2000.  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  Echo (5/11): severe LV dilation with EF < 20%, diffuse hypokinesis, moderate diastolic dysfunction, severe left atrial enlargement.  First hospitalization for CHF was in  5/11.  No drug use.  Adenosine myoview (6/11): EF 21%, diffuse hypokinesis, scar in the anterior and inferior walls and at the apex.  Mild peri-infarct ischemia.  LHC was done (7/11) showing EF 15%, global hypokinesis, LVEDP 22 mmHg, no angiographic CAD.  Echo (10/11): EF 20-25%, severely dilated LV with diffuse hypokinesis, mild MR.  Echo (12/14) with EF 20-25%, moderately dilated LV, diffuse hypokinesis, cannot rule out apical thrombus.  He was unable to tolerate cardiac MRI.  He had St Jude CRT-D implantation in 4/15. Echo (12/15) with EF 25-30%, mild LV dilation, mild LVH, normal RV size and systolic function.  - Echo (7/21): EF 40-45%, diffuse hypokinesis, normal RV.  2. Hypertension 3. Chronic left bundle branch block 4. Hyperlipidemia  Family History: Family history is negative for premature diagnosis of coronary artery disease or any arrhythmia or history of heart failure in first-degree relatives.  Mother and father do have a history of hypertension  Social History: The patient lives in [place] with his wife and 2 children.   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   Current Outpatient Medications  Medication Sig Dispense Refill   aspirin 81 MG EC tablet Take 1 tablet (81  mg total) by mouth daily. 30 tablet 2   carvedilol (COREG) 25 MG tablet TAKE 1 TABLET BY MOUTH TWICE A DAY 180 tablet 3   Cholecalciferol (VITAMIN D) 2000 units CAPS Take by mouth daily.     dapagliflozin propanediol (FARXIGA) 10 MG TABS tablet Take 10 mg by mouth daily before breakfast. 30 tablet 5   fish oil-omega-3 fatty acids 1000 MG capsule Take 1 capsule (1 g total) by mouth daily. 30 capsule 2   Multiple Vitamin (MULTIVITAMIN) tablet Take 1 tablet by mouth daily.       potassium  chloride SA (KLOR-CON) 20 MEQ tablet Take 1 tablet (20 mEq total) by mouth daily. 90 tablet 3   sacubitril-valsartan (ENTRESTO) 97-103 MG Take 1 tablet by mouth 2 (two) times daily. 60 tablet 6   simvastatin (ZOCOR) 40 MG tablet Take 1 tablet (40 mg total) by mouth every evening. 90 tablet 3   spironolactone (ALDACTONE) 25 MG tablet Take 1 tablet (25 mg total) by mouth daily. 90 tablet 1   torsemide (DEMADEX) 20 MG tablet Take 2 tablets by mouth once daily 60 tablet 2   No current facility-administered medications for this encounter.    BP 120/64    Pulse 74    Wt (!) 161.5 kg (356 lb)    SpO2 94%    BMI 46.97 kg/m  General: NAD, obese.  Neck: No JVD, no thyromegaly or thyroid nodule.  Lungs: Clear to auscultation bilaterally with normal respiratory effort. CV: Nondisplaced PMI.  Heart regular S1/S2, no S3/S4, no murmur.  No peripheral edema.  No carotid bruit.  Normal pedal pulses.  Abdomen: Soft, nontender, no hepatosplenomegaly, no distention.  Skin: Intact without lesions or rashes.  Neurologic: Alert and oriented x 3.  Psych: Normal affect. Extremities: No clubbing or cyanosis.  HEENT: Normal.   Assessment/Plan: 1. Nonischemic cardiomyopathy: Doing well symptomatically. NYHA class I-II symptoms, stable.  He now has a [employer] CRT-D system. Euvolemic on exam and by Corvue.  Most recent echo was done today, EF up to 40-45%.  - Continue torsemide 40 mg daily.  BMET today.  - On goal dose of coreg 25 mg twice a day, Entresto 97/103 bid, and spironolactone 25 mg daily.  - Continue dapagliflozin 10 mg daily.  - He had headaches with Bidil and stopped.  2. Obesity: He is working on weight loss.   3. Hyperlipidemia: On Zocor, check lipids today.   [name] 11/10/2019

## 2019-11-19 ENCOUNTER — Ambulatory Visit (INDEPENDENT_AMBULATORY_CARE_PROVIDER_SITE_OTHER): Payer: BC Managed Care – PPO

## 2019-11-19 DIAGNOSIS — I5022 Chronic systolic (congestive) heart failure: Secondary | ICD-10-CM | POA: Diagnosis not present

## 2019-11-19 DIAGNOSIS — Z95810 Presence of automatic (implantable) cardiac defibrillator: Secondary | ICD-10-CM | POA: Diagnosis not present

## 2019-11-23 NOTE — Progress Notes (Signed)
EPIC Encounter for ICM Monitoring  Patient Name: Gary Williamson is a 54 y.o. male Date: 11/23/2019 Primary Care Physican: [name], MD Primary Cardiologist: [name] Electrophysiologist: [name] Bi-V Pacing: >99% 3/16/2021OfficeWeight:357lbs  AT/AF Burden: <1% (taking Aspirin)   Transmission reviewed.   CorvueThoracic impedancenormal but was suggesting possible fluid accumulation from 11/16/2019 to 11/20/2019.  Prescribed:Furosemide 40 mg 1 tabletevery morning and 0.5 tablet (20 mg total) every afternoon.  Labs: 08/16/2019 Creatinine 1.23, BUN 23, Potassium 3.8, Sodium 139, GFR >60 08/09/2019 Creatinine 1.15, BUN 15, Potassium 3.4, Sodium 140, GFR >60  07/17/2019 Creatinine 1.06, BUN 9, Potassium 5.0. Sodium, 139, GFR >60 05/11/2019 Creatinine0.88, BUN12, Potassium4.1, Sodium141, [id number] A complete set of results can be found in Results Review.  Recommendations: None  Follow-up plan: ICM clinic phone appointment on 12/24/2019.   91 day device clinic remote transmission 02/22/2020.    EP/Cardiology Office Visits: Recalls for 01/19/2020 with [name], NP and 05/07/2020 with Dr. [name].    Copy of ICM check sent to Dr. [name].   11/22/2019 Direct Trend Update   3 month ICM trend: 11/19/2019    1 Year ICM trend:       [name], RN 11/23/2019 2:06 PM

## 2019-12-24 ENCOUNTER — Ambulatory Visit (INDEPENDENT_AMBULATORY_CARE_PROVIDER_SITE_OTHER): Payer: BC Managed Care – PPO

## 2019-12-24 DIAGNOSIS — Z95810 Presence of automatic (implantable) cardiac defibrillator: Secondary | ICD-10-CM

## 2019-12-24 DIAGNOSIS — I5022 Chronic systolic (congestive) heart failure: Secondary | ICD-10-CM

## 2019-12-25 ENCOUNTER — Telehealth: Payer: Self-pay

## 2019-12-25 NOTE — Telephone Encounter (Signed)
Remote ICM transmission received.  Attempted call to patient regarding ICM remote transmission and left detailed message per DPR to return call.  Advised to return call for any fluid symptoms or questions.      

## 2019-12-25 NOTE — Progress Notes (Signed)
Take Lasix 40 qam/20 qpm x 2 days then back to 40 mg daily.

## 2019-12-25 NOTE — Progress Notes (Signed)
EPIC Encounter for ICM Monitoring  Patient Name: Gary Williamson is a 54 y.o. male Date: 12/25/2019 Primary Care Physican: [name], MD Primary Cardiologist: [name] Electrophysiologist: [name] Bi-V Pacing: >99% 7/9/2021OfficeWeight:356lbs  AT/AF Burden: <1% (taking Aspirin)   Attempted call to patient and unable to reach.  Left detailed message per DPR regarding transmission. Transmission reviewed.   CorvueThoracic impedancesuggesting possible fluid accumulation since 12/22/2019.  Prescribed:  Furosemide 20 mg take 2 tablets(40 mg total) daily.  Potassium 20 mEq take 1 tablet daily  Labs: 11/09/2019 Creatinine 1.13, BUN 16, Potassium 4.1, Sodium 141, GFR >60 08/16/2019 Creatinine1.23, BUN23, Potassium3.8, Sodium139, GFR>60 08/09/2019 Creatinine1.15, BUN15, Potassium3.4, Sodium140, GFR>60 07/17/2019 Creatinine 1.06, BUN 9, Potassium 5.0. Sodium, 139, GFR >60 05/11/2019 Creatinine0.88, BUN12, Potassium4.1, Sodium141, [id number] A complete set of results can be found in Results Review.  Recommendations: Left voice mail with ICM number and encouraged to call if experiencing any fluid symptoms.  Will advise to take extra Furosemide if patient is reached.  Follow-up plan: ICM clinic phone appointment on 01/01/2020 to recheck fluid levels.   91 day device clinic remote transmission 02/22/2020.    EP/Cardiology Office Visits: Recalls for 01/19/2020 with [name], NP and 05/07/2020 with Dr. [name].    Copy of ICM check sent to Dr. [name] and Dr [name].    3 month ICM trend: 12/24/2019    1 Year ICM trend:       [name], RN 12/25/2019 1:09 PM

## 2019-12-26 NOTE — Progress Notes (Signed)
Call to patient and per DPR left detail message advising of Dr Claris Gladden recommendations to take Lasix 40 qam/20 qpm x 2 days then back to 40 mg daily. Advised to return call by 5 PM today for any questions.

## 2019-12-26 NOTE — Progress Notes (Signed)
Attempted call to patient to advise of Dr Claris Gladden recommendations.  Left message to return call with call back number.

## 2020-01-01 ENCOUNTER — Ambulatory Visit (INDEPENDENT_AMBULATORY_CARE_PROVIDER_SITE_OTHER): Payer: BC Managed Care – PPO

## 2020-01-01 DIAGNOSIS — I5022 Chronic systolic (congestive) heart failure: Secondary | ICD-10-CM

## 2020-01-01 DIAGNOSIS — Z95810 Presence of automatic (implantable) cardiac defibrillator: Secondary | ICD-10-CM

## 2020-01-02 ENCOUNTER — Telehealth: Payer: Self-pay

## 2020-01-02 NOTE — Progress Notes (Signed)
EPIC Encounter for ICM Monitoring  Patient Name: Gary Williamson is a 54 y.o. male Date: 01/02/2020 Primary Care Physican: [name], MD Primary Cardiologist: [name] Electrophysiologist: [name] Bi-V Pacing: >99% 7/9/2021OfficeWeight:356lbs  AT/AF Burden: <1% (taking Aspirin)   Attempted call to patient and unable to reach.  Left detailed message per DPR regarding transmission. Transmission reviewed.   CorvueThoracic impedancereturned to normal after taking extra Furosemide.  Prescribed:  Furosemide 20 mg take 2 tablets(40 mg total) daily.  Potassium 20 mEq take 1 tablet daily  Labs: 11/09/2019 Creatinine 1.13, BUN 16, Potassium 4.1, Sodium 141, GFR >60 08/16/2019 Creatinine1.23, BUN23, Potassium3.8, Sodium139, GFR>60 08/09/2019 Creatinine1.15, BUN15, Potassium3.4, Sodium140, GFR>60 07/17/2019 Creatinine 1.06, BUN 9, Potassium 5.0. Sodium, 139, GFR >60 05/11/2019 Creatinine0.88, BUN12, Potassium4.1, Sodium141, [id number] A complete set of results can be found in Results Review.  Recommendations: Left voice mail with ICM number and encouraged to call if experiencing any fluid symptoms.  Follow-up plan: ICM clinic phone appointment on 01/28/2020. 91 day device clinic remote transmission 02/22/2020.   EP/Cardiology Office Visits:Recalls for 01/19/2020 with [name], NP and 1/5/2022with Dr. [name].   Copy of ICM check sent to Dr.[name]  3 month ICM trend: 01/02/2020    1 Year ICM trend:       [name], RN 01/02/2020 5:01 PM

## 2020-01-02 NOTE — Telephone Encounter (Signed)
Remote ICM transmission received.  Attempted call to patient regarding ICM remote transmission and left detailed message per DPR.  Advised to return call for any fluid symptoms or questions. Next ICM remote transmission scheduled 01/28/2020.

## 2020-01-26 ENCOUNTER — Other Ambulatory Visit (HOSPITAL_COMMUNITY): Payer: Self-pay | Admitting: Internal Medicine

## 2020-01-30 ENCOUNTER — Ambulatory Visit (INDEPENDENT_AMBULATORY_CARE_PROVIDER_SITE_OTHER): Payer: BC Managed Care – PPO | Admitting: Emergency Medicine

## 2020-01-30 ENCOUNTER — Ambulatory Visit (INDEPENDENT_AMBULATORY_CARE_PROVIDER_SITE_OTHER): Payer: BC Managed Care – PPO

## 2020-01-30 ENCOUNTER — Telehealth: Payer: Self-pay

## 2020-01-30 DIAGNOSIS — Z95810 Presence of automatic (implantable) cardiac defibrillator: Secondary | ICD-10-CM | POA: Diagnosis not present

## 2020-01-30 DIAGNOSIS — I428 Other cardiomyopathies: Secondary | ICD-10-CM

## 2020-01-30 DIAGNOSIS — I5022 Chronic systolic (congestive) heart failure: Secondary | ICD-10-CM

## 2020-01-30 NOTE — Progress Notes (Signed)
EPIC Encounter for ICM Monitoring  Patient Name: Gary Williamson is a 54 y.o. male Date: 01/30/2020 Primary Care Physican: [name], MD Primary Cardiologist: [name] Electrophysiologist: [name] Bi-V Pacing: >99% 7/9/2021OfficeWeight:356lbs  AT/AF Burden: <1% (taking Aspirin)   Attempted call to patient and unable to reach. Left detailed message per DPR regarding transmission. Transmission reviewed.  CorvueThoracic impedancesuggesting possible ongoing fluid accumulation since 01/26/2020.  Prescribed:  Torsemide20 mgtake 2tablets(40 mg total)daily.  Potassium 20 mEq take 1 tablet daily  Labs: 11/09/2019 Creatinine 1.13, BUN 16, Potassium 4.1, Sodium 141, GFR >60 08/16/2019 Creatinine1.23, BUN23, Potassium3.8, Sodium139, GFR>60 08/09/2019 Creatinine1.15, BUN15, Potassium3.4, Sodium140, GFR>60 07/17/2019 Creatinine 1.06, BUN 9, Potassium 5.0. Sodium, 139, GFR >60 05/11/2019 Creatinine0.88, BUN12, Potassium4.1, Sodium141, [id number] A complete set of results can be found in Results Review.  Recommendations: Left voice mail with ICM number and encouraged to call if experiencing any fluid symptoms.  Follow-up plan: ICM clinic phone appointment on 02/07/2020 to recheck fluid levels. 91 day device clinic remote transmission 02/22/2020.   EP/Cardiology Office Visits:Recalls for 01/19/2020 with [name], NP and 1/5/2022with Dr. [name].   Copy of ICM check sent to Dr.[name] and Dr [name] for review and recommendations.  3 month ICM trend: 01/30/2020    1 Year ICM trend:       [name], RN 01/30/2020 3:55 PM

## 2020-01-30 NOTE — Telephone Encounter (Signed)
Remote ICM transmission received.  Attempted call to patient regarding ICM remote transmission and left detailed message per DPR to return call.  Advised to return call for any fluid symptoms or questions.      

## 2020-01-31 LAB — CUP PACEART REMOTE DEVICE CHECK
Battery Remaining Longevity: 22 mo
Battery Remaining Percentage: 25 %
Battery Voltage: 2.81 V
Brady Statistic AP VP Percent: 2.3 %
Brady Statistic AP VS Percent: 1 %
Brady Statistic AS VP Percent: 97 %
Brady Statistic AS VS Percent: 1 %
Brady Statistic RA Percent Paced: 2.2 %
Date Time Interrogation Session: 20210929035818
HighPow Impedance: 68 Ohm
HighPow Impedance: 68 Ohm
Implantable Lead Implant Date: 20150421
Implantable Lead Implant Date: 20150421
Implantable Lead Implant Date: 20150421
Implantable Lead Location: 753857
Implantable Lead Location: 753859
Implantable Lead Location: 753860
Implantable Pulse Generator Implant Date: 20150421
Lead Channel Impedance Value: 350 Ohm
Lead Channel Impedance Value: 430 Ohm
Lead Channel Impedance Value: 810 Ohm
Lead Channel Pacing Threshold Amplitude: 0.625 V
Lead Channel Pacing Threshold Amplitude: 0.75 V
Lead Channel Pacing Threshold Amplitude: 0.75 V
Lead Channel Pacing Threshold Pulse Width: 0.5 ms
Lead Channel Pacing Threshold Pulse Width: 0.5 ms
Lead Channel Pacing Threshold Pulse Width: 0.5 ms
Lead Channel Sensing Intrinsic Amplitude: 3.9 mV
Lead Channel Sensing Intrinsic Amplitude: 7.4 mV
Lead Channel Setting Pacing Amplitude: 2 V
Lead Channel Setting Pacing Amplitude: 2 V
Lead Channel Setting Pacing Amplitude: 2 V
Lead Channel Setting Pacing Pulse Width: 0.5 ms
Lead Channel Setting Pacing Pulse Width: 0.5 ms
Lead Channel Setting Sensing Sensitivity: 0.5 mV
Pulse Gen Serial Number: 7157119

## 2020-01-31 NOTE — Progress Notes (Signed)
Take extra torsemide 20 mg x 2 days.

## 2020-02-01 ENCOUNTER — Telehealth: Payer: Self-pay | Admitting: Emergency Medicine

## 2020-02-01 NOTE — Telephone Encounter (Signed)
LMOM to call office. Per Dr [name]  To take extra torsemide 20 mg x 2 days.

## 2020-02-01 NOTE — Telephone Encounter (Signed)
-----   Message from Larey Dresser, MD sent at 01/31/2020 10:19 PM EDT -----   ----- Message ----- From: [name], RN Sent: 01/30/2020   4:02 PM EDT To: Larey Dresser, MD, [name], MD

## 2020-02-04 NOTE — Progress Notes (Signed)
Device clinic nurse, Jodi Geralds attempted call to patient on 02/01/2020 and left message to return call.

## 2020-02-04 NOTE — Progress Notes (Signed)
Remote ICD transmission.   

## 2020-02-06 ENCOUNTER — Telehealth: Payer: Self-pay

## 2020-02-06 NOTE — Telephone Encounter (Signed)
Returning patients phone call. Patient states he ran out of torsemide but now has it. Patient advised to take extra torsemide 20 mg x 2 days. Verbalized understanding. Advised patient to call device clinic back if he has further questions or concerns, verbalized understanding.

## 2020-02-07 ENCOUNTER — Ambulatory Visit (INDEPENDENT_AMBULATORY_CARE_PROVIDER_SITE_OTHER): Payer: BC Managed Care – PPO

## 2020-02-07 DIAGNOSIS — Z95810 Presence of automatic (implantable) cardiac defibrillator: Secondary | ICD-10-CM

## 2020-02-07 DIAGNOSIS — I5022 Chronic systolic (congestive) heart failure: Secondary | ICD-10-CM

## 2020-02-08 ENCOUNTER — Telehealth: Payer: Self-pay

## 2020-02-08 NOTE — Telephone Encounter (Signed)
Remote ICM transmission received.  Attempted call to patient regarding ICM remote transmission and left detailed message per DPR.  Advised to return call for any fluid symptoms or questions. Next ICM remote transmission scheduled 03/10/2020.     

## 2020-02-08 NOTE — Progress Notes (Signed)
EPIC Encounter for ICM Monitoring  Patient Name: Gary Williamson is a 54 y.o. male Date: 02/08/2020 Primary Care Physican: [name], MD Primary Cardiologist: [name] Electrophysiologist: [name] Bi-V Pacing: >99% 7/9/2021OfficeWeight:356lbs  AT/AF Burden: <1% (taking Aspirin)   Attempted call to patient and unable to reach. Left detailed message per DPR regarding transmission. Transmission reviewed.  CorvueThoracic impedancesuggesting fluid levels have returned to normal.  Prescribed:  Torsemide20 mgtake 2tablets(40 mg total)daily.  Potassium 20 mEq take 1 tablet daily  Labs: 11/09/2019 Creatinine 1.13, BUN 16, Potassium 4.1, Sodium 141, GFR >60 08/16/2019 Creatinine1.23, BUN23, Potassium3.8, Sodium139, GFR>60 08/09/2019 Creatinine1.15, BUN15, Potassium3.4, Sodium140, GFR>60 07/17/2019 Creatinine 1.06, BUN 9, Potassium 5.0. Sodium, 139, GFR >60 05/11/2019 Creatinine0.88, BUN12, Potassium4.1, Sodium141, [id number] A complete set of results can be found in Results Review.  Recommendations:Left voice mail with ICM number and encouraged to call if experiencing any fluid symptoms.  Follow-up plan: ICM clinic phone appointment on 03/10/2020. 91 day device clinic remote transmission 02/22/2020.   EP/Cardiology Office Visits:Recalls for 01/19/2020 with [name], NP and 1/5/2022with Dr. [name].   Copy of ICM check sent to Dr.[name]    3 month ICM trend: 02/08/2020    1 Year ICM trend:       [name], RN 02/08/2020 9:34 AM

## 2020-02-11 ENCOUNTER — Ambulatory Visit (HOSPITAL_COMMUNITY)
Admission: RE | Admit: 2020-02-11 | Discharge: 2020-02-11 | Disposition: A | Discharge status: Home / Self Care | Payer: BC Managed Care – PPO | Source: Ambulatory Visit | Attending: Internal Medicine | Admitting: Internal Medicine

## 2020-02-11 ENCOUNTER — Other Ambulatory Visit: Payer: Self-pay

## 2020-02-11 DIAGNOSIS — I5022 Chronic systolic (congestive) heart failure: Secondary | ICD-10-CM

## 2020-02-11 DIAGNOSIS — Z01812 Encounter for preprocedural laboratory examination: Secondary | ICD-10-CM | POA: Diagnosis present

## 2020-02-11 LAB — BASIC METABOLIC PANEL
Anion gap: 12 (ref 5–15)
BUN: 16 mg/dL (ref 6–20)
CO2: 25 mmol/L (ref 22–32)
Calcium: 9 mg/dL (ref 8.9–10.3)
Chloride: 102 mmol/L (ref 98–111)
Creatinine, Ser: 1.37 mg/dL — ABNORMAL HIGH (ref 0.61–1.24)
GFR, Estimated: 58 mL/min — ABNORMAL LOW (ref >60)
Glucose, Bld: 144 mg/dL — ABNORMAL HIGH (ref 70–99)
Potassium: 3.3 mmol/L — ABNORMAL LOW (ref 3.5–5.1)
Sodium: 139 mmol/L (ref 135–145)

## 2020-02-12 ENCOUNTER — Telehealth (HOSPITAL_COMMUNITY): Payer: Self-pay

## 2020-02-12 NOTE — Telephone Encounter (Signed)
Malena Edman, RN  02/12/2020 9:44 AM EDT Back to Top    Patient advised and verbalized understanding    Kerry Dory, [place]  02/11/2020 3:30 PM EDT     [phone] (M) LMOM

## 2020-02-12 NOTE — Telephone Encounter (Signed)
-----   Message from Larey Dresser, MD sent at 02/11/2020  1:08 PM EDT ----- Increase K in diet.

## 2020-02-14 NOTE — Telephone Encounter (Signed)
Error

## 2020-03-10 ENCOUNTER — Ambulatory Visit (INDEPENDENT_AMBULATORY_CARE_PROVIDER_SITE_OTHER): Payer: BC Managed Care – PPO

## 2020-03-10 DIAGNOSIS — I5022 Chronic systolic (congestive) heart failure: Secondary | ICD-10-CM | POA: Diagnosis not present

## 2020-03-10 DIAGNOSIS — Z95810 Presence of automatic (implantable) cardiac defibrillator: Secondary | ICD-10-CM | POA: Diagnosis not present

## 2020-03-12 ENCOUNTER — Other Ambulatory Visit: Payer: Self-pay | Admitting: Internal Medicine

## 2020-03-12 MED ORDER — CARVEDILOL 25 MG PO TABS: ORAL_TABLET | ORAL | 3 refills | Status: DC

## 2020-03-12 NOTE — Telephone Encounter (Signed)
*  STAT* If patient is at the pharmacy, call can be transferred to refill team.   1. Which medications need to be refilled? (please list name of each medication and dose if known)  carvedilol (COREG) 25 MG tablet  2. Which pharmacy/location (including street and city if local pharmacy) is medication to be sent to? [facility] [place] [place]  3. Do they need a 30 day or 90 day supply? 90 with refills

## 2020-03-14 NOTE — Progress Notes (Signed)
EPIC Encounter for ICM Monitoring  Patient Name: Gary Williamson is a 54 y.o. male Date: 03/14/2020 Primary Care Physican: [name], MD Primary Cardiologist: [name] Electrophysiologist: [name] Bi-V Pacing: >99% LastOfficeWeight:356lbs  AT/AF Burden: <1% (taking Aspirin)   Transmission reviewed.  CorvueThoracic impedancenormal fluid levels.  Prescribed:  Torsemide20 mgtake 2tablets(40 mg total)daily.  Potassium 20 mEq take 1 tablet daily  Spironolactone 25 mg take 1 tablet daily  Labs: 02/11/2020 Creatinine 1.37, BUN 16, Potassium 3.3, Sodium 139, GFR 58 11/09/2019 Creatinine 1.13, BUN 16, Potassium 4.1, Sodium 141, GFR >60 08/16/2019 Creatinine1.23, BUN23, Potassium3.8, Sodium139, GFR>60 08/09/2019 Creatinine1.15, BUN15, Potassium3.4, Sodium140, GFR>60 07/17/2019 Creatinine 1.06, BUN 9, Potassium 5.0. Sodium, 139, GFR >60 05/11/2019 Creatinine0.88, BUN12, Potassium4.1, Sodium141, [id number] A complete set of results can be found in Results Review.  Recommendations:No changes  Follow-up plan: ICM clinic phone appointment on 04/14/2020. 91 day device clinic remote transmission 04/30/2020.   EP/Cardiology Office Visits:Recalls for 01/19/2020 with [name], NP and 1/5/2022with Dr. [name].   Copy of ICM check sent to Dr.[name]    3 month ICM trend: 03/10/2020    1 Year ICM trend:       [name], RN 03/14/2020 3:05 PM

## 2020-04-16 ENCOUNTER — Other Ambulatory Visit (HOSPITAL_COMMUNITY): Payer: Self-pay | Admitting: Adult Health

## 2020-04-20 ENCOUNTER — Other Ambulatory Visit (HOSPITAL_COMMUNITY): Payer: Self-pay | Admitting: Cardiology

## 2020-04-20 DIAGNOSIS — I428 Other cardiomyopathies: Secondary | ICD-10-CM

## 2020-04-22 NOTE — Progress Notes (Signed)
No ICM remote transmission received for 04/14/2020 and next ICM transmission scheduled for 05/05/2020.   

## 2020-04-30 ENCOUNTER — Other Ambulatory Visit: Payer: Self-pay

## 2020-04-30 ENCOUNTER — Ambulatory Visit (INDEPENDENT_AMBULATORY_CARE_PROVIDER_SITE_OTHER): Payer: BC Managed Care – PPO

## 2020-04-30 ENCOUNTER — Telehealth: Payer: Self-pay

## 2020-04-30 DIAGNOSIS — Z95810 Presence of automatic (implantable) cardiac defibrillator: Secondary | ICD-10-CM

## 2020-04-30 DIAGNOSIS — I5022 Chronic systolic (congestive) heart failure: Secondary | ICD-10-CM

## 2020-04-30 LAB — CUP PACEART REMOTE DEVICE CHECK
Battery Remaining Longevity: 19 mo
Battery Remaining Percentage: 23 %
Battery Voltage: 2.8 V
Brady Statistic AP VP Percent: 2.6 %
Brady Statistic AP VS Percent: 1 %
Brady Statistic AS VP Percent: 97 %
Brady Statistic AS VS Percent: 1 %
Brady Statistic RA Percent Paced: 2.6 %
Date Time Interrogation Session: 20211229053236
HighPow Impedance: 56 Ohm
HighPow Impedance: 56 Ohm
Implantable Lead Implant Date: 20150421
Implantable Lead Implant Date: 20150421
Implantable Lead Implant Date: 20150421
Implantable Lead Location: 753857
Implantable Lead Location: 753859
Implantable Lead Location: 753860
Implantable Pulse Generator Implant Date: 20150421
Lead Channel Impedance Value: 340 Ohm
Lead Channel Impedance Value: 450 Ohm
Lead Channel Impedance Value: 750 Ohm
Lead Channel Pacing Threshold Amplitude: 0.75 V
Lead Channel Pacing Threshold Amplitude: 0.75 V
Lead Channel Pacing Threshold Amplitude: 0.75 V
Lead Channel Pacing Threshold Pulse Width: 0.5 ms
Lead Channel Pacing Threshold Pulse Width: 0.5 ms
Lead Channel Pacing Threshold Pulse Width: 0.5 ms
Lead Channel Sensing Intrinsic Amplitude: 3.9 mV
Lead Channel Sensing Intrinsic Amplitude: 9.3 mV
Lead Channel Setting Pacing Amplitude: 2 V
Lead Channel Setting Pacing Amplitude: 2 V
Lead Channel Setting Pacing Amplitude: 2 V
Lead Channel Setting Pacing Pulse Width: 0.5 ms
Lead Channel Setting Pacing Pulse Width: 0.5 ms
Lead Channel Setting Sensing Sensitivity: 0.5 mV
Pulse Gen Serial Number: 7157119

## 2020-04-30 NOTE — Progress Notes (Signed)
EPIC Encounter for ICM Monitoring  Patient Name: Gary Williamson is a 54 y.o. male Date: 04/30/2020 Primary Care Physican: Doristine Bosworth, MD Primary Cardiologist: Shirlee Latch Electrophysiologist: [name] Bi-V Pacing: >99% LastOfficeWeight:356lbs  AT/AF Burden: <1% (taking Aspirin)   Attempted call to patient and unable to reach.  Left detailed message per DPR regarding transmission. Transmission reviewed.   CorvueThoracic impedancesuggesting possible fluid accumulation starting 04/23/2020.  Prescribed:  Torsemide20 mgtake 2tablets(40 mg total)daily.  Potassium 20 mEq take 1 tablet daily  Spironolactone 25 mg take 1 tablet daily  Labs: 02/11/2020 Creatinine 1.37, BUN 16, Potassium 3.3, Sodium 139, GFR 58 11/09/2019 Creatinine 1.13, BUN 16, Potassium 4.1, Sodium 141, GFR >60 08/16/2019 Creatinine1.23, BUN23, Potassium3.8, Sodium139, GFR>60 08/09/2019 Creatinine1.15, BUN15, Potassium3.4, Sodium140, GFR>60 07/17/2019 Creatinine 1.06, BUN 9, Potassium 5.0. Sodium, 139, GFR >60 05/11/2019 Creatinine0.88, BUN12, Potassium4.1, Sodium141, [id number] A complete set of results can be found in Results Review.  Recommendations: Unable to reach patient.  Copy to Dr Shirlee Latch for review and recommendations if needed.   Follow-up plan: ICM clinic phone appointment on1/07/2020 to recheck fluid levels. 91 day device clinic remote transmission 07/30/2020.   EP/Cardiology Office Visits:Recalls for 01/19/2020 with Gypsy Balsam, NP and 1/5/2022with Dr. Shirlee Latch.   Copy of ICM check sent to Dr.[name]   3 month ICM trend: 04/30/2020    1 Year ICM trend:       Karie Soda, RN 04/30/2020 9:22 AM

## 2020-04-30 NOTE — Progress Notes (Signed)
Increase torsemide to 40 qam/20 qpm. BMET 1 week.  

## 2020-04-30 NOTE — Telephone Encounter (Signed)
Remote ICM transmission received.  Attempted call to patient regarding ICM remote transmission and left detailed message per DPR to return call.   

## 2020-05-01 NOTE — Progress Notes (Signed)
lmtrc

## 2020-05-05 NOTE — Progress Notes (Signed)
Yes no changes

## 2020-05-05 NOTE — Progress Notes (Signed)
Spoke with patient.  He denies any fluid symptoms and reports it may have been holiday foods that caused fluid accumulation.  Advised Dr Shirlee Latch had recommended to increase Torsemide in response to last weeks report but will send him 05/05/20 report showing fluid levels have returned to normal.  Advised at this time to continue Torsemide 40 mg daily as prescribed until Dr Shirlee Latch reviews 05/05/20 report.  Will call patient back if Dr Shirlee Latch wants to move forward with increased Torsemide dosage.   05/05/20 Corvue Impedance has returned to normal without increasing Torsemide dosage.

## 2020-05-15 NOTE — Progress Notes (Signed)
Remote ICD transmission.   

## 2020-05-24 ENCOUNTER — Other Ambulatory Visit (HOSPITAL_COMMUNITY): Payer: Self-pay | Admitting: Adult Health

## 2020-05-26 MED ORDER — DAPAGLIFLOZIN PROPANEDIOL 10 MG PO TABS: 10.0000 mg | ORAL_TABLET | Freq: Every day | ORAL | 0 refills | Status: DC

## 2020-05-26 NOTE — Addendum Note (Signed)
Addended byShela Nevin R on: 0/27/7412 03:04 PM   Modules accepted: Orders

## 2020-06-07 ENCOUNTER — Other Ambulatory Visit (HOSPITAL_COMMUNITY): Payer: Self-pay | Admitting: Internal Medicine

## 2020-06-07 DIAGNOSIS — I428 Other cardiomyopathies: Secondary | ICD-10-CM

## 2020-06-09 ENCOUNTER — Ambulatory Visit (INDEPENDENT_AMBULATORY_CARE_PROVIDER_SITE_OTHER): Payer: BC Managed Care – PPO

## 2020-06-09 DIAGNOSIS — I5022 Chronic systolic (congestive) heart failure: Secondary | ICD-10-CM

## 2020-06-09 DIAGNOSIS — Z95810 Presence of automatic (implantable) cardiac defibrillator: Secondary | ICD-10-CM | POA: Diagnosis not present

## 2020-06-10 ENCOUNTER — Other Ambulatory Visit: Payer: Self-pay

## 2020-06-16 NOTE — Progress Notes (Signed)
EPIC Encounter for ICM Monitoring  Patient Name: Gary Williamson is a 55 y.o. male Date: 06/16/2020 Primary Care Physican: [name], MD Primary Cardiologist: [name] Electrophysiologist: [name] Bi-V Pacing: >99% LastOfficeWeight:356lbs  AT/AF Burden: <1% (taking Aspirin)   Transmission reviewed.   CorvueThoracic impedancesuggesting normal fluid levels.  Prescribed:  Torsemide20 mgtake 2tablets(40 mg total)daily.  Potassium 20 mEq take 1 tablet daily  Spironolactone 25 mg take 1 tablet daily  Labs: 02/11/2020 Creatinine 1.37, BUN 16, Potassium 3.3, Sodium 139, GFR 58 11/09/2019 Creatinine 1.13, BUN 16, Potassium 4.1, Sodium 141, GFR >60 08/16/2019 Creatinine1.23, BUN23, Potassium3.8, Sodium139, GFR>60 08/09/2019 Creatinine1.15, BUN15, Potassium3.4, Sodium140, GFR>60 07/17/2019 Creatinine 1.06, BUN 9, Potassium 5.0. Sodium, 139, GFR >60 05/11/2019 Creatinine0.88, BUN12, Potassium4.1, Sodium141, [id number] A complete set of results can be found in Results Review.  Recommendations: No changes.  Follow-up plan: ICM clinic phone appointment on3/14/2022. 91 day device clinic remote transmission3/30/2022.   EP/Cardiology Office Visits:Recalls for 01/19/2020 with [name], NP and 1/5/2022with Dr. [name].   Copy of ICM check sent to Dr.[name]  3 month ICM trend: 06/09/2020.    1 Year ICM trend:       [name], RN 06/16/2020 2:33 PM

## 2020-07-15 ENCOUNTER — Ambulatory Visit (INDEPENDENT_AMBULATORY_CARE_PROVIDER_SITE_OTHER): Payer: BC Managed Care – PPO

## 2020-07-15 DIAGNOSIS — I5022 Chronic systolic (congestive) heart failure: Secondary | ICD-10-CM

## 2020-07-15 DIAGNOSIS — Z95810 Presence of automatic (implantable) cardiac defibrillator: Secondary | ICD-10-CM

## 2020-07-18 ENCOUNTER — Telehealth: Payer: Self-pay

## 2020-07-18 NOTE — Telephone Encounter (Signed)
Remote ICM transmission received.  Attempted call to patient regarding ICM remote transmission and left detailed message per DPR.  Advised to return call for any fluid symptoms or questions. Next ICM remote transmission scheduled 08/19/2020.

## 2020-07-18 NOTE — Progress Notes (Signed)
EPIC Encounter for ICM Monitoring  Patient Name: Gary Williamson is a 55 y.o. male Date: 07/18/2020 Primary Care Physican: [name], MD Primary Cardiologist: [name] Electrophysiologist: [name] Bi-V Pacing: >99% LastOfficeWeight:356lbs  AT/AF Burden: <1% (taking Aspirin)   Attempted call to patient and unable to reach.  Left detailed message per DPR regarding transmission. Transmission reviewed.   CorvueThoracic impedancesuggesting normal fluid levels.    Prescribed:  Torsemide20 mgtake 2tablets(40 mg total)daily.  Potassium 20 mEq take 1 tablet daily  Spironolactone 25 mg take 1 tablet daily  Labs: 02/11/2020 Creatinine 1.37, BUN 16, Potassium 3.3, Sodium 139, GFR 58 11/09/2019 Creatinine 1.13, BUN 16, Potassium 4.1, Sodium 141, GFR >60 08/16/2019 Creatinine1.23, BUN23, Potassium3.8, Sodium139, GFR>60 08/09/2019 Creatinine1.15, BUN15, Potassium3.4, Sodium140, GFR>60 07/17/2019 Creatinine 1.06, BUN 9, Potassium 5.0. Sodium, 139, GFR >60 05/11/2019 Creatinine0.88, BUN12, Potassium4.1, Sodium141, [id number] A complete set of results can be found in Results Review.  Recommendations: Left voice mail with ICM number and encouraged to call if experiencing any fluid symptoms.  Follow-up plan: ICM clinic phone appointment on4/19/2022. 91 day device clinic remote transmission3/30/2022.   EP/Cardiology Office Visits: Left message to call office for EP and HF clinic appointments. Recalls for 01/19/2020 with [name], NP and 1/5/2022with Dr. [name].   Copy of ICM check sent to Dr.[name]  3 month ICM trend: 07/15/2020.    1 Year ICM trend:       [name], RN 07/18/2020 1:13 PM

## 2020-07-30 ENCOUNTER — Ambulatory Visit (HOSPITAL_COMMUNITY): Admit: 2020-07-30 | Discharge status: Against Medical Advice | Payer: BC Managed Care – PPO

## 2020-07-30 ENCOUNTER — Ambulatory Visit (INDEPENDENT_AMBULATORY_CARE_PROVIDER_SITE_OTHER): Payer: BC Managed Care – PPO

## 2020-07-30 DIAGNOSIS — I428 Other cardiomyopathies: Secondary | ICD-10-CM | POA: Diagnosis not present

## 2020-07-30 LAB — CUP PACEART REMOTE DEVICE CHECK
Battery Remaining Longevity: 18 mo
Battery Remaining Percentage: 20 %
Battery Voltage: 2.77 V
Brady Statistic AP VP Percent: 3.1 %
Brady Statistic AP VS Percent: 1 %
Brady Statistic AS VP Percent: 96 %
Brady Statistic AS VS Percent: 1 %
Brady Statistic RA Percent Paced: 3.1 %
Date Time Interrogation Session: 20220330020014
HighPow Impedance: 75 Ohm
HighPow Impedance: 75 Ohm
Implantable Lead Implant Date: 20150421
Implantable Lead Implant Date: 20150421
Implantable Lead Implant Date: 20150421
Implantable Lead Location: 753857
Implantable Lead Location: 753859
Implantable Lead Location: 753860
Implantable Pulse Generator Implant Date: 20150421
Lead Channel Impedance Value: 430 Ohm
Lead Channel Impedance Value: 490 Ohm
Lead Channel Impedance Value: 910 Ohm
Lead Channel Pacing Threshold Amplitude: 0.625 V
Lead Channel Pacing Threshold Amplitude: 0.75 V
Lead Channel Pacing Threshold Amplitude: 0.75 V
Lead Channel Pacing Threshold Pulse Width: 0.5 ms
Lead Channel Pacing Threshold Pulse Width: 0.5 ms
Lead Channel Pacing Threshold Pulse Width: 0.5 ms
Lead Channel Sensing Intrinsic Amplitude: 4.4 mV
Lead Channel Sensing Intrinsic Amplitude: 9.3 mV
Lead Channel Setting Pacing Amplitude: 2 V
Lead Channel Setting Pacing Amplitude: 2 V
Lead Channel Setting Pacing Amplitude: 2 V
Lead Channel Setting Pacing Pulse Width: 0.5 ms
Lead Channel Setting Pacing Pulse Width: 0.5 ms
Lead Channel Setting Sensing Sensitivity: 0.5 mV
Pulse Gen Serial Number: 7157119

## 2020-08-12 NOTE — Progress Notes (Signed)
Remote ICD transmission.   

## 2020-08-18 ENCOUNTER — Encounter (HOSPITAL_BASED_OUTPATIENT_CLINIC_OR_DEPARTMENT_OTHER): Payer: Self-pay | Admitting: Nurse Practitioner

## 2020-08-18 ENCOUNTER — Other Ambulatory Visit (HOSPITAL_BASED_OUTPATIENT_CLINIC_OR_DEPARTMENT_OTHER)
Admission: RE | Admit: 2020-08-18 | Discharge: 2020-08-18 | Disposition: A | Discharge status: Home / Self Care | Payer: BC Managed Care – PPO | Source: Ambulatory Visit | Attending: Nurse Practitioner | Admitting: Nurse Practitioner

## 2020-08-18 ENCOUNTER — Other Ambulatory Visit: Payer: Self-pay

## 2020-08-18 ENCOUNTER — Ambulatory Visit (INDEPENDENT_AMBULATORY_CARE_PROVIDER_SITE_OTHER): Payer: BC Managed Care – PPO | Admitting: Nurse Practitioner

## 2020-08-18 VITALS — BP 139/82 | HR 77 | Ht 73.0 in | Wt 348.6 lb

## 2020-08-18 DIAGNOSIS — G4733 Obstructive sleep apnea (adult) (pediatric): Secondary | ICD-10-CM

## 2020-08-18 DIAGNOSIS — Z6841 Body Mass Index (BMI) 40.0 and over, adult: Secondary | ICD-10-CM | POA: Diagnosis present

## 2020-08-18 DIAGNOSIS — R7303 Prediabetes: Secondary | ICD-10-CM | POA: Insufficient documentation

## 2020-08-18 DIAGNOSIS — I10 Essential (primary) hypertension: Secondary | ICD-10-CM | POA: Diagnosis present

## 2020-08-18 DIAGNOSIS — I5022 Chronic systolic (congestive) heart failure: Secondary | ICD-10-CM | POA: Diagnosis not present

## 2020-08-18 DIAGNOSIS — E7800 Pure hypercholesterolemia, unspecified: Secondary | ICD-10-CM | POA: Diagnosis present

## 2020-08-18 DIAGNOSIS — I428 Other cardiomyopathies: Secondary | ICD-10-CM | POA: Insufficient documentation

## 2020-08-18 DIAGNOSIS — D120 Benign neoplasm of cecum: Secondary | ICD-10-CM

## 2020-08-18 DIAGNOSIS — Z7689 Persons encountering health services in other specified circumstances: Secondary | ICD-10-CM | POA: Insufficient documentation

## 2020-08-18 DIAGNOSIS — Z95810 Presence of automatic (implantable) cardiac defibrillator: Secondary | ICD-10-CM

## 2020-08-18 DIAGNOSIS — Z1211 Encounter for screening for malignant neoplasm of colon: Secondary | ICD-10-CM

## 2020-08-18 DIAGNOSIS — D125 Benign neoplasm of sigmoid colon: Secondary | ICD-10-CM

## 2020-08-18 HISTORY — DX: Body Mass Index (BMI) 40.0 and over, adult: Z684

## 2020-08-18 HISTORY — DX: Persons encountering health services in other specified circumstances: Z76.89

## 2020-08-18 HISTORY — DX: Prediabetes: R73.03

## 2020-08-18 LAB — COMPREHENSIVE METABOLIC PANEL WITH GFR
ALT: 21 U/L (ref 0–44)
AST: 19 U/L (ref 15–41)
Albumin: 4.3 g/dL (ref 3.5–5.0)
Alkaline Phosphatase: 76 U/L (ref 38–126)
Anion gap: 10 (ref 5–15)
BUN: 13 mg/dL (ref 6–20)
CO2: 32 mmol/L (ref 22–32)
Calcium: 9.3 mg/dL (ref 8.9–10.3)
Chloride: 101 mmol/L (ref 98–111)
Creatinine, Ser: 1.19 mg/dL (ref 0.61–1.24)
GFR, Estimated: >60 mL/min
Glucose, Bld: 102 mg/dL — ABNORMAL HIGH (ref 70–99)
Potassium: 3.5 mmol/L (ref 3.5–5.1)
Sodium: 143 mmol/L (ref 135–145)
Total Bilirubin: 0.6 mg/dL (ref 0.3–1.2)
Total Protein: 7.5 g/dL (ref 6.5–8.1)

## 2020-08-18 LAB — CBC WITH DIFFERENTIAL/PLATELET
Abs Immature Granulocytes: 0.04 10*3/uL (ref 0.00–0.07)
Basophils Absolute: 0 10*3/uL (ref 0.0–0.1)
Basophils Relative: 0 %
Eosinophils Absolute: 0.3 10*3/uL (ref 0.0–0.5)
Eosinophils Relative: 2 %
HCT: 43.7 % (ref 39.0–52.0)
Hemoglobin: 14.4 g/dL (ref 13.0–17.0)
Immature Granulocytes: 0 %
Lymphocytes Relative: 19 %
Lymphs Abs: 2.1 10*3/uL (ref 0.7–4.0)
MCH: 29 pg (ref 26.0–34.0)
MCHC: 33 g/dL (ref 30.0–36.0)
MCV: 87.9 fL (ref 80.0–100.0)
Monocytes Absolute: 0.8 10*3/uL (ref 0.1–1.0)
Monocytes Relative: 7 %
Neutro Abs: 8.1 10*3/uL — ABNORMAL HIGH (ref 1.7–7.7)
Neutrophils Relative %: 72 %
Platelets: 244 10*3/uL (ref 150–400)
RBC: 4.97 MIL/uL (ref 4.22–5.81)
RDW: 14.2 % (ref 11.5–15.5)
WBC: 11.4 10*3/uL — ABNORMAL HIGH (ref 4.0–10.5)
nRBC: 0 % (ref 0.0–0.2)

## 2020-08-18 LAB — HEMOGLOBIN A1C
Hgb A1c MFr Bld: 6.5 % — ABNORMAL HIGH (ref 4.8–5.6)
Mean Plasma Glucose: 139.85 mg/dL

## 2020-08-18 LAB — LIPID PANEL
Cholesterol: 188 mg/dL (ref 0–200)
HDL: 33 mg/dL — ABNORMAL LOW
LDL Cholesterol: 122 mg/dL — ABNORMAL HIGH (ref 0–99)
Total CHOL/HDL Ratio: 5.7 ratio
Triglycerides: 165 mg/dL — ABNORMAL HIGH
VLDL: 33 mg/dL (ref 0–40)

## 2020-08-18 MED ORDER — ENTRESTO 97-103 MG PO TABS: 1.0000 | ORAL_TABLET | Freq: Two times a day (BID) | ORAL | 3 refills | Status: DC

## 2020-08-18 MED ORDER — DAPAGLIFLOZIN PROPANEDIOL 10 MG PO TABS: 10.0000 mg | ORAL_TABLET | Freq: Every day | ORAL | 0 refills | Status: DC

## 2020-08-18 MED ORDER — TORSEMIDE 20 MG PO TABS: 40.0000 mg | ORAL_TABLET | Freq: Every day | ORAL | 3 refills | Status: DC

## 2020-08-18 NOTE — Assessment & Plan Note (Signed)
ICD managed and interrogated by cardiology.  No concerning findings present today.  Will continue to monitor.

## 2020-08-18 NOTE — Assessment & Plan Note (Signed)
Due for colonoscopy. No alarm signs present at this time. Referral placed to [facility] where he had his last test performed.

## 2020-08-18 NOTE — Assessment & Plan Note (Signed)
Review of medical, surgical, family history, medications, SDOH, and allergies. CPE performed today with labs due to patients need to have this complete by the end of the month for his work.  Will obtain labs today.  Plan to follow-up in 6 months from chronic conditions

## 2020-08-18 NOTE — Patient Instructions (Addendum)
Recommendations from today's visit:  I have sent in refills for Lisabeth Register, and Demadex (torsemide) to the [facility]. If you need additional refills, please let us know.   I have ordered labs for today as part of a physical exam. These are done on the ground floor of this building.  When you step off of the elevator on the ground floor, you will see a glass room to the right- enter the double glass doors and someone will help you.   I sent the referral to GI for a colonoscopy for you- You have had this done at [facility] in the past by Dr. Scarlette Shorts.   We will plan to follow-up in about 6 months to see how you are doing.  If we need to see you sooner based on lab results, we will let you know.   _____________________________________________________________  Thank you for choosing [place] [facility] for your Primary Care needs. I am excited for the opportunity to partner with you to meet your health care goals. It was a pleasure meeting you today!  I am an Adult-Geriatric Nurse Practitioner with a background in caring for patients for more than 20 years. I received my Paediatric nurse in Nursing and my Doctor of Nursing Practice degrees at [employer]. I received additional fellowship training in primary care and sports medicine after receiving my doctorate degree. I provide primary care and sports medicine services to patients age 69 and older within this office. I am also a provider with the [facility] and the director of the APP Fellowship with [facility].  I am a [place] native, but have called the [place] area home for nearly 20 years and am proud to be a member of this community.   I am passionate about providing the best service to you through preventive medicine and supportive care. I consider you a part of the medical team and value your input. I work diligently to ensure that you are heard and  your needs are met in a safe and effective manner. I want you to feel comfortable with me as your provider and want you to know that your health concerns are important to me.   For your information, our office hours are Monday- Friday 8:00 AM - 5:00 PM At this time I am not in the office on Wednesdays.  If you have questions or concerns, please call our office at [phone] or send [name] a MyChart message and we will respond as quickly as possible.   For all urgent or time sensitive needs we ask that you please call the office to avoid delays. MyChart is not constantly monitored and replies may take up to 72 business hours.  MyChart Policy: . MyChart allows for you to see your visit notes, after visit summary, provider recommendations, lab and tests results, make an appointment, request refills, and contact your provider or the office for non-urgent questions or concerns.  . Providers are seeing patients during normal business hours and do not have built in time to review MyChart messages. We ask that you allow a minimum of 72 business hours for MyChart message responses.  . Complex MyChart concerns may require a visit. Your provider may request you schedule a virtual or in person visit to ensure we are providing the best care possible. . MyChart messages sent after 4:00 PM on Friday will not be received by the provider until Monday morning.  Lab and Test Results: . You will receive your lab and test results on MyChart as soon as they are completed and results have been sent by the lab or testing facility. Due to this service, you will receive your results BEFORE your provider.  . Please allow a minimum of 72 business hours for your provider to receive and review lab and test results and contact you about.   . Most lab and test result comments from the provider will be sent through [place]. Your provider may recommend changes to the plan of care, follow-up visits, repeat testing, ask questions,  or request an office visit to discuss these results. You may reply directly to this message or call the office at [phone] to provide information for the provider or set up an appointment. . In some instances, you will be called with test results and recommendations. Please let us know if this is preferred and we will make note of this in your chart to provide this for you.    . If you have not heard a response to your lab or test results in 72 business hours, please call the office to let us know.   After Hours: . For all non-emergency after hours needs, please call the office at [phone] and select the option to reach the on-call provider service. On-call services are shared between multiple [place] offices and therefore it will not be possible to speak directly with your provider. On-call providers may provide medical advice and recommendations, but are unable to provide refills for maintenance medications.  . For all emergency or urgent medical needs after normal business hours, we recommend that you seek care at the closest Urgent Care or Emergency Department to ensure appropriate treatment in a timely manner.  [facility] has a 24 hour emergency room located on the ground floor for your convenience.    Please do not hesitate to reach out to [name] with concerns.   Thank you, again, for choosing me as your health care partner. I appreciate your trust and look forward to learning more about you.   Worthy Keeler, DNP, AGNP-c _________________________________________________________________________   Diphtheria Toxoid; Tetanus Toxoid Adsorbed, DT, Td What is this medicine? DIPHTHERIA AND TETANUS TOXOIDS ADSORBED (dif THEER ee uh and TET n [name] TOK soids ad SAWRB) is a vaccine. It is used to prevent infections of diphtheria and tetanus (lockjaw). This medicine may be used for other purposes; ask your health care provider or pharmacist if you have  questions. COMMON BRAND NAME(S): DECAVAC, TDVAX, TENIVAC What should I tell my health care provider before I take this medicine? They need to know if you have any of these conditions:  bleeding disorder  immune system problems  infection with fever  low levels of platelets in the blood  an unusual or allergic reaction to diphtheria or tetanus toxoid, latex, thimerosal, other medicines, foods, dyes, or preservatives  pregnant or trying to get pregnant  breast-feeding How should I use this medicine? This vaccine is for injection into a muscle. It is given by a health care professional. A copy of Vaccine Information Statements will be given before each vaccination. Read this sheet carefully each time. The sheet may change frequently. Talk to your pediatrician regarding the use of this medicine in children. While this drug may be prescribed for selected conditions, precautions do apply. Overdosage: If you think you have taken too much of this medicine contact a poison control center or emergency room at once.  NOTE: This medicine is only for you. Do not share this medicine with others. What if I miss a dose? Keep appointments for follow-up (booster) doses as directed. It is important not to miss your dose. Call your doctor or health care professional if you are unable to keep an appointment. What may interact with this medicine?  adalimumab  anakinra  infliximab  live vaccines  medicines that suppress your immune system  medicines to treat cancer  medicines that treat or prevent blood clots like daily aspirin, enoxaparin, heparin, ticlopidine, warfarin  radiopharmaceuticals like iodine I-125 or I-131 This list may not describe all possible interactions. Give your health care provider a list of all the medicines, herbs, non-prescription drugs, or dietary supplements you use. Also tell them if you smoke, drink alcohol, or use illegal drugs. Some items may interact with your  medicine. What should I watch for while using this medicine? Contact your doctor or health care professional and seek emergency medical care if any serious side effects occur. This vaccine, like all vaccines, may not fully protect everyone. What side effects may I notice from receiving this medicine? Side effects that you should report to your doctor or health care professional as soon as possible:  allergic reactions like skin rash, itching or hives, swelling of the face, lips, or tongue  arthritis pain  breathing problems  changes in hearing  extreme changes in behavior  fast, irregular heartbeat  fever over 100 degrees F  pain, tingling, numbness in the hands or feet  seizures  unusually weak or tired Side effects that usually do not require medical attention (report to your doctor or health care professional if they continue or are bothersome):  aches or pains  bruising, pain, swelling at site where injected  headache  loss of appetite  low-grade fever of 100 degrees F or less  nausea, vomiting  sleepy  swollen glands This list may not describe all possible side effects. Call your doctor for medical advice about side effects. You may report side effects to FDA at 1-800-FDA-1088. Where should I keep my medicine? This drug is given in a hospital or clinic and will not be stored at home. NOTE: This sheet is a summary. It may not cover all possible information. If you have questions about this medicine, talk to your doctor, pharmacist, or health care provider.  2021 Elsevier/Gold Standard (2019-08-27 22:24:55)  ______________________________________________________________________  Recombinant Zoster (Shingles) Vaccine: What You Need to Know 1. Why get vaccinated? Recombinant zoster (shingles) vaccine can prevent shingles. Shingles (also called herpes zoster, or just zoster) is a painful skin rash, usually with blisters. In addition to the rash, shingles can  cause fever, headache, chills, or upset stomach. More rarely, shingles can lead to pneumonia, hearing problems, blindness, brain inflammation (encephalitis), or death. The most common complication of shingles is long-term nerve pain called postherpetic neuralgia (PHN). PHN occurs in the areas where the shingles rash was, even after the rash clears up. It can last for months or years after the rash goes away. The pain from PHN can be severe and debilitating. About 10 to 18% of people who get shingles will experience PHN. The risk of PHN increases with age. An older adult with shingles is more likely to develop PHN and have longer lasting and more severe pain than a younger person with shingles. Shingles is caused by the varicella zoster virus, the same virus that causes chickenpox. After you have chickenpox, the virus stays in your body and can  cause shingles later in life. Shingles cannot be passed from one person to another, but the virus that causes shingles can spread and cause chickenpox in someone who had never had chickenpox or received chickenpox vaccine. 2. Recombinant shingles vaccine Recombinant shingles vaccine provides strong protection against shingles. By preventing shingles, recombinant shingles vaccine also protects against PHN. Recombinant shingles vaccine is the preferred vaccine for the prevention of shingles. However, a different vaccine, live shingles vaccine, may be used in some circumstances. The recombinant shingles vaccine is recommended for adults 50 years and older without serious immune problems. It is given as a two-dose series. This vaccine is also recommended for people who have already gotten another type of shingles vaccine, the live shingles vaccine. There is no live virus in this vaccine. Shingles vaccine may be given at the same time as other vaccines. 3. Talk with your health care provider Tell your vaccine provider if the person getting the vaccine:  Has had an  allergic reaction after a previous dose of recombinant shingles vaccine, or has any severe, life-threatening allergies.  Is pregnant or breastfeeding.  Is currently experiencing an episode of shingles. In some cases, your health care provider may decide to postpone shingles vaccination to a future visit. People with minor illnesses, such as a cold, may be vaccinated. People who are moderately or severely ill should usually wait until they recover before getting recombinant shingles vaccine. Your health care provider can give you more information. 4. Risks of a vaccine reaction  A sore arm with mild or moderate pain is very common after recombinant shingles vaccine, affecting about 80% of vaccinated people. Redness and swelling can also happen at the site of the injection.  Tiredness, muscle pain, headache, shivering, fever, stomach pain, and nausea happen after vaccination in more than half of people who receive recombinant shingles vaccine. In clinical trials, about 1 out of 6 people who got recombinant zoster vaccine experienced side effects that prevented them from doing regular activities. Symptoms usually went away on their own in 2 to 3 days. You should still get the second dose of recombinant zoster vaccine even if you had one of these reactions after the first dose. People sometimes faint after medical procedures, including vaccination. Tell your provider if you feel dizzy or have vision changes or ringing in the ears. As with any medicine, there is a very remote chance of a vaccine causing a severe allergic reaction, other serious injury, or death. 5. What if there is a serious problem? An allergic reaction could occur after the vaccinated person leaves the clinic. If you see signs of a severe allergic reaction (hives, swelling of the face and throat, difficulty breathing, a fast heartbeat, dizziness, or weakness), call 9-1-1 and get the person to the nearest hospital. For other signs  that concern you, call your health care provider. Adverse reactions should be reported to the Vaccine Adverse Event Reporting System (VAERS). Your health care provider will usually file this report, or you can do it yourself. Visit the VAERS website at www.vaers.SamedayNews.es or call 210-552-0846. VAERS is only for reporting reactions, and VAERS staff do not give medical advice. 6. How can I learn more?  Ask your health care provider.  Call your local or state health department.  Contact the Centers for Disease Control and Prevention (CDC): ? Call 631-577-7866 (1-800-CDC-INFO) or ? Visit CDC's website at http://hunter.com/ Vaccine Information Statement Recombinant Zoster Vaccine (03/01/2018) This information is not intended to replace advice given to you by  your health care provider. Make sure you discuss any questions you have with your health care provider. Document Revised: 12/21/2019 Document Reviewed: 12/21/2019 Elsevier Patient Education  [place].

## 2020-08-18 NOTE — Assessment & Plan Note (Signed)
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 Cardiology interrogates on regular basis.  No signs of fluid volume overload today- lungs clear, no peripheral edema noted.  Heart rate regular and strong.  No warning signs present.  Will obtain labs today for close examination.  At this time plan to continue medications as directed.  Refills provided on entresto, farxiga, and torsemide today.

## 2020-08-18 NOTE — Assessment & Plan Note (Addendum)
History of CHF (systolic) with no current symptoms or concerns at this time.  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 Cardiology interrogates on regular basis.  No signs of fluid volume overload today- lungs clear, no peripheral edema noted.  Heart rate regular and strong.  No warning signs present.  Most recent EF improved to 50% with current regimens.  Will obtain labs today for close examination.  At this time plan to continue medications as directed.  Refills provided on entresto, farxiga, and torsemide today as he is running low. No changes to dosages at this time. Will be happy to work with cardiology to help manage medications if so desired.  Follow-up in 6 months or sooner if needed.

## 2020-08-18 NOTE — Assessment & Plan Note (Signed)
Due for repeat colonoscopy- referral placed to day.  No current concerns or complaints

## 2020-08-18 NOTE — Progress Notes (Signed)
New Patient Office Visit  Subjective:  Patient ID: Gary Williamson, male    DOB: 1966/04/16  Age: 55 y.o. MRN: [id number]  CC:  Chief Complaint  Patient presents with  . Establish Care    HPI Gary Williamson is a 55 year old male presenting today to establish care with this practice. He has previously received care from Dr. Nolon Rod at [facility] on [address] which has since closed.   Review of records reveals the patient has extensive cardiac history of viral idiopathic pericarditis, CHF-Systolic, HTN, LBBB, non ischemic cardiomyopathy, OSA with CPAP, HLD,  and lower extremity edema. He also has history of obesity and vitamin D deficiency.  He currently has a bi-ventricular implantable cardioverter defibrillator.  Cardiology is working to get insurance approval for home sleep study for new CPAP with better mask.  He is followed by Dr. [name] and Dr. [name] with cardiology.   He is married to Gary Williamson and they have 2 children (boys 76 and 84) and reside in [place], [place]. He works full time in [place] and enjoys his work. He reports he feels safe in his home and work environments. He denies difficulty obtaining his medications.   He endorses a diet low in sodium, saturated fats, and carbohydrates. His wife prepares most meals and they eat a good amount of lean meat and vegetables. He walks daily for about 20 minutes while at work.  He endorses approximately 1 alcoholic beverage every 2 weeks. He does not smoke or use nicotine products or recreational drugs.   He is currently sexually active with one partner, his wife. He has no concerns for STI today.  He denies any changes to his bowel or bladder habits.   He does have a lump in his right axilla that has been present for a couple of years. It is not painful and is not getting larger. He said his wife noticed it and he wanted to mention it.   He is due for a colonoscopy. He has regular vision exams and regular dental exams.  Past Medical  History:  Diagnosis Date  . Acute idiopathic pericarditis 03/24/2010   Overview:  Viral Idiopathic Pericarditis   . Chronic systolic heart failure ([place])   . Hypertension   . LBBB (left bundle branch block)    Chronic  . Leg edema   . NICM (nonischemic cardiomyopathy) ([place])    Probably nonischemic; LHC in 2002/03 no sig CAD per report (done in [place]). Cardiomyopathy for 9-10 yrs. 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 Echo 5/11: severe LVE, EF <20%, diff HK, mod dias dys, sev. LAE. Hosp for CHF in 5/11. Ad. MV 6/11: EF 21%, mild per-infart ischemia. LHC (7/11): EF 15%, no CAD, LVEDP 22; Echo (10/11): EF 20-25%;  Echo (12/12):  EF 20%, mod LVH, Gr 1 DD, diff HK, mild LAE  . OSA (obstructive sleep apnea)    cpap used  . Pure hypercholesterolemia   . Severe obesity ([place])   . Vitamin D deficiency     Past Surgical History:  Procedure Laterality Date  . BI-VENTRICULAR IMPLANTABLE CARDIOVERTER DEFIBRILLATOR N/A 08/21/2013   Procedure: BI-VENTRICULAR IMPLANTABLE CARDIOVERTER DEFIBRILLATOR  (CRT-D);  Surgeon: Coralyn Mark, MD;  Location: [facility] CATH LAB;  Service: Cardiovascular;  Laterality: N/A;  . CARDIAC CATHETERIZATION  2002 or 2003  . COLONOSCOPY N/A 09/16/2014   Procedure: COLONOSCOPY;  Surgeon: [name], MD;  Location: [facility] ENDOSCOPY;  Service: Endoscopy;  Laterality: N/A;  . LEFT HEART CATH  11/13/09  Dr [name]    Family History  Problem Relation Age of Onset  . Hypertension Mother   . Alzheimer's disease Mother   . Hyperlipidemia Mother   . Breast cancer Mother   . Stroke Maternal Grandmother   . Arthritis Father   . Dementia Father   . Hypertension Father   . Alzheimer's disease Father   . COPD Brother   . Coronary artery disease Neg Hx   . Heart failure Neg Hx   . Heart disease Neg Hx     Social History   Socioeconomic History  . Marital status: Married    Spouse name: Gary Williamson  . Number of children: 2  . Years of education: Not on file  . Highest education level:  Not on file  Occupational History  . 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 Smoking status: Never Smoker  . Smokeless tobacco: Never Used  Substance and Sexual Activity  . Alcohol use: Yes    Alcohol/week: 0.0 - 2.0 standard drinks    Comment: rare to occ.  . Drug use: No  . Sexual activity: Yes    Birth control/protection: Condom  Other Topics Concern  . Not on file  Social History Narrative   Marital status: married x 17 years; happily married      Children: 2 children (15,12)      Lives in [place] with his wife and 2 children      Employment: [employer]; Solicitor x 12 years; likes work.  Lots of traveling.       Tobacco: none      Alcohol:  1 drink per week      Exercise:  Walking three days per week; 20 minutes.       Seatbelt: 100%   Social Determinants of Radio broadcast assistant Strain: Not on file  Food Insecurity: Not on file  Transportation Needs: Not on file  Physical Activity: Not on file  Stress: Not on file  Social Connections: Not on file  Intimate Partner Violence: Not on file    ROS Review of Systems  Constitutional: Negative for activity change, appetite change, fatigue and unexpected weight change.  HENT: Negative for congestion, postnasal drip, rhinorrhea, sinus pressure and sinus pain.   Eyes: Negative for visual disturbance.  Respiratory: Negative for cough, chest tightness, shortness of breath and wheezing.   Cardiovascular: Negative for chest pain, palpitations and leg swelling.  Gastrointestinal: Negative for abdominal pain, constipation, diarrhea and nausea.  Endocrine: Negative for polydipsia, polyphagia and polyuria.  Genitourinary: Negative for difficulty urinating, flank pain, frequency and urgency.  Musculoskeletal: Negative for arthralgias, back pain and gait problem.  Skin: Negative for color change, pallor, rash and wound.  Neurological:  Negative for dizziness, tremors, weakness, light-headedness, numbness and headaches.  Hematological: Negative for adenopathy. Does not bruise/bleed easily.  Psychiatric/Behavioral: Negative for agitation, decreased concentration, dysphoric mood, sleep disturbance and suicidal ideas. The patient is not nervous/anxious.     Objective:   Today's Vitals: BP 139/82   Pulse 77   Ht 6\' 1"  (1.854 m)   Wt (!) 348 lb 9.6 oz (158.1 kg)   SpO2 97%   BMI 45.99 kg/m   Physical Exam Vitals and nursing note reviewed.  Constitutional:      Appearance: Normal appearance. He is obese.  HENT:     Head: Normocephalic and atraumatic.  Right Ear: Tympanic membrane, ear canal and external ear normal.     Left Ear: Tympanic membrane, ear canal and external ear normal.     Nose: Nose normal.     Mouth/Throat:     Mouth: Mucous membranes are moist.     Pharynx: Oropharynx is clear. No oropharyngeal exudate.  Eyes:     Extraocular Movements: Extraocular movements intact.     Conjunctiva/sclera: Conjunctivae normal.     Pupils: Pupils are equal, round, and reactive to light.  Neck:     Vascular: No carotid bruit.  Cardiovascular:     Rate and Rhythm: Normal rate and regular rhythm.     Pulses: Normal pulses.     Heart sounds: Normal heart sounds. No murmur heard. No friction rub.  Pulmonary:     Effort: Pulmonary effort is normal.     Breath sounds: Normal breath sounds. No wheezing.  Abdominal:     General: Bowel sounds are normal. There is no distension.     Palpations: Abdomen is soft.     Tenderness: There is no abdominal tenderness. There is no left CVA tenderness or guarding.  Musculoskeletal:        General: Normal range of motion.     Cervical back: Normal range of motion and neck supple. No rigidity or tenderness.     Right lower leg: No edema.     Left lower leg: No edema.  Lymphadenopathy:     Cervical: No cervical adenopathy.  Skin:    General: Skin is warm and dry.      Capillary Refill: Capillary refill takes less than 2 seconds.  Neurological:     General: No focal deficit present.     Mental Status: He is alert and oriented to person, place, and time.     Cranial Nerves: No cranial nerve deficit.     Sensory: No sensory deficit.     Motor: No weakness.     Coordination: Coordination normal.     Gait: Gait normal.  Psychiatric:        Mood and Affect: Mood normal.        Behavior: Behavior normal.        Thought Content: Thought content normal.        Judgment: Judgment normal.     Assessment & Plan:   Problem List Items Addressed This Visit      Cardiovascular and Mediastinum   Chronic systolic heart failure (HCC)    History of CHF (systolic) with no current symptoms or concerns at this time.  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 Cardiology interrogates on regular basis.  No signs of fluid volume overload today- lungs clear, no peripheral edema noted.  Heart rate regular and strong.  No warning signs present.  Most recent EF improved to 50% with current regimens.  Will obtain labs today for close examination.  At this time plan to continue medications as directed.  Refills provided on entresto, farxiga, and torsemide today as he is running low. No changes to dosages at this time. Will be happy to work with cardiology to help manage medications if so desired.  Follow-up in 6 months or sooner if needed.       Relevant Medications   dapagliflozin propanediol (FARXIGA) 10 MG TABS tablet   sacubitril-valsartan (ENTRESTO) 97-103 MG   torsemide (DEMADEX) 20 MG tablet   Other Relevant Orders   Comprehensive metabolic panel (Completed)   CBC with Differential (Completed)   Lipid panel   HgB  A1c   NICM (nonischemic cardiomyopathy) (HCC)    History of NICM with no current symptoms or concerns at this time. 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 Cardiology interrogates on regular basis.  No signs of fluid volume overload today-  lungs clear, no peripheral edema noted.  Heart rate regular and strong.  No warning signs present.  Will obtain labs today for close examination.  At this time plan to continue medications as directed.  Refills provided on entresto, farxiga, and torsemide today.       Relevant Medications   dapagliflozin propanediol (FARXIGA) 10 MG TABS tablet   sacubitril-valsartan (ENTRESTO) 97-103 MG   torsemide (DEMADEX) 20 MG tablet   Other Relevant Orders   Comprehensive metabolic panel (Completed)   CBC with Differential (Completed)   Lipid panel   HgB A1c   Essential hypertension    Elevated blood pressure with reading 139/82 in the office today. He endorses monitoring at home with readings typically in the 120's/80's. No warning signs, no chest pain, no palpitations, no headaches.  Recommend continue medication as directed. Entresto, farxiga, and torsemide refilled today.  Will obtain labs today for evaluation.  Follow-up in 6 months.       Relevant Medications   sacubitril-valsartan (ENTRESTO) 97-103 MG   torsemide (DEMADEX) 20 MG tablet   Other Relevant Orders   Comprehensive metabolic panel (Completed)   CBC with Differential (Completed)   Lipid panel   HgB A1c     Respiratory   OSA (obstructive sleep apnea)    Long standing history of OSA with need for CPAP usage. Unfortunately, he has been unable to find an appropriate fitting mask. No significant concerns for daytime sleepiness or frequent awakenings during the night.  Cardiology is working on a new sleep study and mask for him. Continue to work towards weight loss with daily activity and dietary modifications.  Will follow.         Digestive   Benign neoplasm of cecum    Due for repeat colonoscopy. Referral placed today.  No current concerns or complaints.       Benign neoplasm of sigmoid colon    Due for repeat colonoscopy- referral placed to day.  No current concerns or complaints         Other   HLD  (hyperlipidemia)    History of hyperlipidemia currently treated with simvastatin 40mg  per day.  Last labs were about a year ago, therefore we will get labs today. Patient is fasting.  Will make changes to the plan of care based on lab results.       Relevant Medications   sacubitril-valsartan (ENTRESTO) 97-103 MG   torsemide (DEMADEX) 20 MG tablet   Other Relevant Orders   Comprehensive metabolic panel (Completed)   CBC with Differential (Completed)   Lipid panel   HgB A1c   Colon cancer screening    Due for colonoscopy. No alarm signs present at this time. Referral placed to [facility] where he had his last test performed.       Relevant Orders   Ambulatory referral to Gastroenterology   ICD (implantable cardioverter-defibrillator) in place    ICD managed and interrogated by cardiology.  No concerning findings present today.  Will continue to monitor.       Encounter to establish care - Primary    Review of medical, surgical, family history, medications, SDOH, and allergies. CPE performed today with labs due to patients need to have this complete by the end of the month  for his work.  Will obtain labs today.  Plan to follow-up in 6 months from chronic conditions      Prediabetes    History of pre-diabetes. Last A1c 6.0 in 07/2018.  Will monitor today. He is on farxiga for cardiovascular protection, therefore benefit from lower blood sugar levels should be present.  Plan to continue [place] and determine if additional measures are needed based on lab results.  Recommend low carbohydrate diet with daily exercise.       Relevant Medications   dapagliflozin propanediol (FARXIGA) 10 MG TABS tablet   Other Relevant Orders   Comprehensive metabolic panel (Completed)   CBC with Differential (Completed)   Lipid panel   HgB A1c   Body mass index (BMI) of 45.0-49.9 in adult (HCC)    BMI 45.99 today.  Recommend strict dietary monitoring to help reduce excess calories through  non-nutrient based foods and beverages.  Recommend measuring portion sizes and eliminating snacks with the exception of fresh vegetables.  Recommend daily exercise of at least 20 minutes per day gradually increasing time and distance. Walking at a pace that can carry a conversation.       Relevant Medications   dapagliflozin propanediol (FARXIGA) 10 MG TABS tablet      Outpatient Encounter Medications as of 08/18/2020  Medication Sig  . aspirin 81 MG EC tablet Take 1 tablet (81 mg total) by mouth daily.  . carvedilol (COREG) 25 MG tablet TAKE 1 TABLET BY MOUTH TWICE A DAY  . Cholecalciferol (VITAMIN D) 2000 units CAPS Take by mouth daily.  . fish oil-omega-3 fatty acids 1000 MG capsule Take 1 capsule (1 g total) by mouth daily.  . Multiple Vitamin (MULTIVITAMIN) tablet Take 1 tablet by mouth daily.  . potassium chloride SA (KLOR-CON) 20 MEQ tablet Take 1 tablet (20 mEq total) by mouth daily.  [place] spironolactone (ALDACTONE) 25 MG tablet Take 1 tablet (25 mg total) by mouth daily.  . [DISCONTINUED] dapagliflozin propanediol (FARXIGA) 10 MG TABS tablet Take 1 tablet (10 mg total) by mouth daily. Needs appt  . [DISCONTINUED] sacubitril-valsartan (ENTRESTO) 97-103 MG Take 1 tablet by mouth 2 (two) times daily. Please call to schedule an appointment for future refills. 1st attempt  . [DISCONTINUED] torsemide (DEMADEX) 20 MG tablet Take 2 tablets (40 mg total) by mouth daily.  . dapagliflozin propanediol (FARXIGA) 10 MG TABS tablet Take 1 tablet (10 mg total) by mouth daily. Needs appt  . sacubitril-valsartan (ENTRESTO) 97-103 MG Take 1 tablet by mouth 2 (two) times daily. Please call to schedule an appointment for future refills. 1st attempt  . simvastatin (ZOCOR) 40 MG tablet Take 1 tablet (40 mg total) by mouth every evening.  . torsemide (DEMADEX) 20 MG tablet Take 2 tablets (40 mg total) by mouth daily.  . [DISCONTINUED] furosemide (LASIX) 40 MG tablet Take by mouth.   No facility-administered  encounter medications on file as of 08/18/2020.    Follow-up: Return in about 6 months (around 02/17/2021) for HTN/HLD/Wt  -- Lab appointment today- CPE performed. [name], NP

## 2020-08-18 NOTE — Assessment & Plan Note (Addendum)
History of pre-diabetes. Last A1c 6.0 in 07/2018.  Will monitor today. He is on farxiga for cardiovascular protection, therefore benefit from lower blood sugar levels should be present.  Plan to continue [place] and determine if additional measures are needed based on lab results.  Recommend low carbohydrate diet with daily exercise.

## 2020-08-18 NOTE — Assessment & Plan Note (Signed)
Elevated blood pressure with reading 139/82 in the office today. He endorses monitoring at home with readings typically in the 120's/80's. No warning signs, no chest pain, no palpitations, no headaches.  Recommend continue medication as directed. Entresto, farxiga, and torsemide refilled today.  Will obtain labs today for evaluation.  Follow-up in 6 months.

## 2020-08-18 NOTE — Assessment & Plan Note (Addendum)
Long standing history of OSA with need for CPAP usage. Unfortunately, he has been unable to find an appropriate fitting mask. No significant concerns for daytime sleepiness or frequent awakenings during the night.  Cardiology is working on a new sleep study and mask for him. Continue to work towards weight loss with daily activity and dietary modifications.  Will follow.

## 2020-08-18 NOTE — Assessment & Plan Note (Signed)
BMI 45.99 today.  Recommend strict dietary monitoring to help reduce excess calories through non-nutrient based foods and beverages.  Recommend measuring portion sizes and eliminating snacks with the exception of fresh vegetables.  Recommend daily exercise of at least 20 minutes per day gradually increasing time and distance. Walking at a pace that can carry a conversation.

## 2020-08-18 NOTE — Assessment & Plan Note (Signed)
Due for repeat colonoscopy. Referral placed today.  No current concerns or complaints.

## 2020-08-18 NOTE — Assessment & Plan Note (Signed)
History of hyperlipidemia currently treated with simvastatin 40mg  per day.  Last labs were about a year ago, therefore we will get labs today. Patient is fasting.  Will make changes to the plan of care based on lab results.

## 2020-08-19 ENCOUNTER — Ambulatory Visit (INDEPENDENT_AMBULATORY_CARE_PROVIDER_SITE_OTHER): Payer: BC Managed Care – PPO

## 2020-08-19 DIAGNOSIS — I5022 Chronic systolic (congestive) heart failure: Secondary | ICD-10-CM

## 2020-08-19 DIAGNOSIS — Z95810 Presence of automatic (implantable) cardiac defibrillator: Secondary | ICD-10-CM | POA: Diagnosis not present

## 2020-08-20 NOTE — Progress Notes (Signed)
Lab results reveal elevated triglycerides, low good cholesterol levels (HDL) and elevated bad cholesterol levels (LDL). These three factors significantly increase risk of heart attack and stroke and can also lead to multiple complications to the organs over time.   Hemoglobin A1c at 6.5%, which indicates a diagnosis of diabetes type 2.   These conditions together can be very hard on the organs and the body and can lead to multiple complications if not managed aggressively Gary Williamson on.   To prevent complications, recommend starting Metformin and Rosuvastatin for diabetes and cholesterol as well as monitoring foods and decreasing saturated fats, fried foods, and carbohydrates (soda, pasta, bread, potatoes, rice) in the diet.   I would like to see you back to discuss these new diagnoses and how best to manage these conditions to help protect your health.   Please call patient to schedule return appointment for new diabetes and hyperlipidemia in the near future- 40 minute appointment for education, please.

## 2020-08-21 ENCOUNTER — Telehealth (HOSPITAL_BASED_OUTPATIENT_CLINIC_OR_DEPARTMENT_OTHER): Payer: Self-pay

## 2020-08-21 NOTE — Telephone Encounter (Signed)
Left message for patient to call back for results and recommendations. 

## 2020-08-21 NOTE — Telephone Encounter (Signed)
-----   Message from [name], NP sent at 08/20/2020  4:16 PM EDT ----- Lab results reveal elevated triglycerides, low good cholesterol levels (HDL) and elevated bad cholesterol levels (LDL). These three factors significantly increase risk of heart attack and stroke and can also lead to multiple complications to the organs over time.   Hemoglobin A1c at 6.5%, which indicates a diagnosis of diabetes type 2.   These conditions together can be very hard on the organs and the body and can lead to multiple complications if not managed aggressively early on.   To prevent complications, recommend starting Metformin and Rosuvastatin for diabetes and cholesterol as well as monitoring foods and decreasing saturated fats, fried foods, and carbohydrates (soda, pasta, bread, potatoes, rice) in the diet.   I would like to see you back to discuss these new diagnoses and how best to manage these conditions to help protect your health.   Please call patient to schedule return appointment for new diabetes and hyperlipidemia in the near future- 40 minute appointment for education, please.

## 2020-08-22 ENCOUNTER — Telehealth (HOSPITAL_BASED_OUTPATIENT_CLINIC_OR_DEPARTMENT_OTHER): Payer: Self-pay

## 2020-08-22 NOTE — Telephone Encounter (Signed)
-----   Message from Sara E Early, NP sent at 08/20/2020  4:16 PM EDT ----- Lab results reveal elevated triglycerides, low good cholesterol levels (HDL) and elevated bad cholesterol levels (LDL). These three factors significantly increase risk of heart attack and stroke and can also lead to multiple complications to the organs over time.   Hemoglobin A1c at 6.5%, which indicates a diagnosis of diabetes type 2.   These conditions together can be very hard on the organs and the body and can lead to multiple complications if not managed aggressively early on.   To prevent complications, recommend starting Metformin and Rosuvastatin for diabetes and cholesterol as well as monitoring foods and decreasing saturated fats, fried foods, and carbohydrates (soda, pasta, bread, potatoes, rice) in the diet.   I would like to see you back to discuss these new diagnoses and how best to manage these conditions to help protect your health.   Please call patient to schedule return appointment for new diabetes and hyperlipidemia in the near future- 40 minute appointment for education, please.  

## 2020-08-22 NOTE — Progress Notes (Signed)
EPIC Encounter for ICM Monitoring  Patient Name: Gary Williamson is a 55 y.o. male Date: 08/22/2020 Primary Care Physican: [name], NP Primary Cardiologist: [name] Electrophysiologist: [name] Bi-V Pacing: >99% 08/18/2020 OfficeWeight:348lbs  AT/AF Burden: <1% (taking Aspirin)   Transmission reviewed.   CorvueThoracic impedancesuggestingnormal fluid levels.    Prescribed:  Torsemide20 mgtake 2tablets(40 mg total)daily.  Potassium 20 mEq take 1 tablet daily  Spironolactone 25 mg take 1 tablet daily  Labs: 08/18/2020 Creatinine 1.19, BUN 13, Potassium 3.5, Sodium 143, GFR >60 A complete set of results can be found in Results Review.  Recommendations: No changes  Follow-up plan: ICM clinic phone appointment on5/27/2022. 91 day device clinic remote transmission6/29/2022.   EP/Cardiology Office Visits:  Recalls for 01/19/2020 with [name], NP and 1/5/2022with Dr. [name].   Copy of ICM check sent to Dr.[name]  3 month ICM trend: 08/18/2020.    1 Year ICM trend:       [name], RN 08/22/2020 11:27 AM

## 2020-08-22 NOTE — Telephone Encounter (Signed)
Patient called back and scheduled an appointment on 08/28/20 @ 8:10am.

## 2020-08-27 NOTE — Progress Notes (Signed)
ICM Remote transmission rescheduled from 09/26/2020 to 10/06/2020.

## 2020-08-28 ENCOUNTER — Encounter (HOSPITAL_BASED_OUTPATIENT_CLINIC_OR_DEPARTMENT_OTHER): Payer: Self-pay | Admitting: Nurse Practitioner

## 2020-08-28 ENCOUNTER — Ambulatory Visit (HOSPITAL_BASED_OUTPATIENT_CLINIC_OR_DEPARTMENT_OTHER): Payer: BC Managed Care – PPO | Admitting: Nurse Practitioner

## 2020-08-28 ENCOUNTER — Other Ambulatory Visit: Payer: Self-pay

## 2020-08-28 VITALS — BP 116/62 | HR 79 | Ht 73.0 in | Wt 348.6 lb

## 2020-08-28 DIAGNOSIS — E1165 Type 2 diabetes mellitus with hyperglycemia: Secondary | ICD-10-CM | POA: Diagnosis not present

## 2020-08-28 DIAGNOSIS — E782 Mixed hyperlipidemia: Secondary | ICD-10-CM

## 2020-08-28 DIAGNOSIS — I10 Essential (primary) hypertension: Secondary | ICD-10-CM | POA: Diagnosis not present

## 2020-08-28 DIAGNOSIS — I5022 Chronic systolic (congestive) heart failure: Secondary | ICD-10-CM | POA: Diagnosis not present

## 2020-08-28 DIAGNOSIS — Z6841 Body Mass Index (BMI) 40.0 and over, adult: Secondary | ICD-10-CM

## 2020-08-28 MED ORDER — BLOOD GLUCOSE METER KIT: PACK | 0 refills | Status: AC

## 2020-08-28 MED ORDER — ROSUVASTATIN CALCIUM 20 MG PO TABS: 20.0000 mg | ORAL_TABLET | Freq: Every day | ORAL | 3 refills | Status: DC

## 2020-08-28 MED ORDER — METFORMIN HCL 500 MG PO TABS: ORAL_TABLET | ORAL | 0 refills | Status: DC

## 2020-08-28 NOTE — Assessment & Plan Note (Signed)
No symptoms present at this time. Plan to be aggressive in his treatment for new diabetes and HLD given his history of cardiovascular disease.  Change simvastatin to rosuvastatin and start metformin with goal dose of 200mg  per day.  Follow-up in 3 months

## 2020-08-28 NOTE — Assessment & Plan Note (Signed)
BP well controlled today at 11/62. No changes to BP medication today.  Change simvastatin to rosuvastatin given elevated lipids and new diabetes.  Follow-up in 3 months

## 2020-08-28 NOTE — Assessment & Plan Note (Signed)
History of HLD on simvastatin 40mg  long term with elevation in lipids noted on recent labs despite treatment and dietary modifications. New diagnosis of diabetes.  Plan to change medication to rosuvastatin 20mg  for improved lipid control and strict dietary adjustments to reduce saturated fats and carbohydrates.  Recommend increased activity and weight loss efforts for all chronic disease management.  Will follow-up in 3 months and check labs at that time.

## 2020-08-28 NOTE — Assessment & Plan Note (Signed)
New diagnosis of DM2 with concurrent cardiovascular disease, HTN, and HLD.  On Farxiga long term for cardiovascular protection.  Will add metformin with tapered dosing up to goal of 1000mg  BID as tolerated.  Recommend strict dietary modifications for low carbohydrates and saturated fats.  Recommend daily blood glucose monitoring qAM with fasting levels and as needed for monitoring of diet and with symptoms of hyper or hypoglycemia.  Information provided on diet, exercise, reading labels, hyperglycemia, hypoglycemia, diabetic foot care, sick day instructions, and general diabetes recommendations.  Annual dilated eye exam and flu vaccine recommended. COVID vaccines with boosters recommended.  Annual foot exam- to be done at next visit.  Annual urine microalbumin next visit.  Will repeat labs in 3 months.

## 2020-08-28 NOTE — Progress Notes (Signed)
Established Patient Office Visit  Subjective:  Patient ID: Gary Williamson, male    DOB: [birth year]  Age: 55 y.o. MRN: [id number]  CC:  Chief Complaint  Patient presents with  . Diabetes    HPI Gary Williamson presents to the office for follow-up to discuss lab results and determine management of chronic diseases and newly diagnosed diabetes mellitus.   Recent labs revealed elevated lipids and triglycerides despite lipid lower medication in addition to a hemoglobin A1c reading of 6.5%. He has been taking simvastatin 36m for his cholesterol and has been on Farxiga due to heart failure and pre-diabetes. He has also been taking omega-3 fatty acid supplements. Despite these medications, his levels were found to be increased on recent labs.   He has a complicated medical history with chronic systolic heart failure, LBBB, cardiomyopathy, hypertension, OSA, and obesity. He is managed by cardiology with Dr. MAundra Dubin   The 10-year ASCVD risk score (Mikey BussingDC JBrooke Bonito, et al., 2013) is: 19.2%   Values used to calculate the score:     Age: [age]years     Sex: Male     Is Non-Hispanic African American: Yes     Diabetic: Yes     Tobacco smoker: No     Systolic Blood Pressure: 1354mmHg     Is BP treated: Yes     HDL Cholesterol: 33 mg/dL     Total Cholesterol: 188 mg/dL  He has no known family history of diabetes. He does report that his son has been told he is pre-diabetic. He denies symptoms of excessive thirst, hunger, or urination. He does not have any open or slow healing wounds. He is managing his blood pressure and cardiac conditions well. He has been taking his medications as prescribed without missed doses.  He eats a diet low in sodium, saturated fats, and carbohydrates. He walks daily for at least 20 minutes on his lunch break. Most of his foods are home cooked with lean meats and vegetables.   Past Medical History:  Diagnosis Date  . Acute idiopathic pericarditis 03/24/2010   Overview:   Viral Idiopathic Pericarditis   . Chronic systolic heart failure ([place]   . Hypertension   . LBBB (left bundle branch block)    Chronic  . Leg edema   . 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 Cardiomyopathy for 9-10 yrs. 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 Echo 5/11: severe LVE, EF <20%, diff HK, mod dias dys, sev. LAE. Hosp for CHF in 5/11. Ad. MV 6/11: EF 21%, mild per-infart ischemia. LHC (7/11): EF 15%, no CAD, LVEDP 22; Echo (10/11): EF 20-25%;  Echo (12/12):  EF 20%, mod LVH, Gr 1 DD, diff HK, mild LAE  . OSA (obstructive sleep apnea)    cpap used  . Pure hypercholesterolemia   . Severe obesity ([place]   . Vitamin D deficiency     Past Surgical History:  Procedure Laterality Date  . BI-VENTRICULAR IMPLANTABLE CARDIOVERTER DEFIBRILLATOR N/A 08/21/2013   Procedure: BI-VENTRICULAR IMPLANTABLE CARDIOVERTER DEFIBRILLATOR  (CRT-D);  Surgeon: JCoralyn Mark MD;  [facility];  Service: Cardiovascular;  Laterality: N/A;  . CARDIAC CATHETERIZATION  2002 or 2003  . COLONOSCOPY N/A 09/16/2014   Procedure: COLONOSCOPY;  Surgeon: JIrene Shipper MD;  Location: [facility];  Service: Endoscopy;  Laterality: N/A;  . LEFT HEART CATH  11/13/09   Dr MAundra Dubin   Family History  Problem Relation Age of Onset  . Hypertension Mother   . Alzheimer's disease Mother   . Hyperlipidemia Mother   . Breast cancer Mother   . Stroke Maternal Grandmother   . Arthritis Father   . Dementia Father   . Hypertension Father   . Alzheimer's disease Father   . COPD Brother   . Coronary artery disease Neg Hx   . Heart failure Neg Hx   . Heart disease Neg Hx     Social History   Socioeconomic History  . Marital status: Married    Spouse name: [name]  . Number of children: 2  . Years of education: Not on file  . Highest education level: Not on file  Occupational History  . 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 Smoking status: Never Smoker  . Smokeless tobacco: Never Used  Substance and Sexual Activity  . Alcohol use: Yes    Alcohol/week: 0.0 - 2.0 standard drinks    Comment: rare to occ.  . Drug use: No  . Sexual activity: Yes    Birth control/protection: Condom  Other Topics Concern  . Not on file  Social History Narrative   Marital status: married x 17 years; happily married      Children: 2 children (15,12)      Lives in [place] with his wife and 2 children      Employment: [employer]; Solicitor x 12 years; likes work.  Lots of traveling.       Tobacco: none      Alcohol:  1 drink per week      Exercise:  Walking three days per week; 20 minutes.       Seatbelt: 100%   Social Determinants of Radio broadcast assistant Strain: Not on file  Food Insecurity: Not on file  Transportation Needs: Not on file  Physical Activity: Not on file  Stress: Not on file  Social Connections: Not on file  Intimate Partner Violence: Not on file    Outpatient Medications Prior to Visit  Medication Sig Dispense Refill  . aspirin 81 MG EC tablet Take 1 tablet (81 mg total) by mouth daily. 30 tablet 2  . carvedilol (COREG) 25 MG tablet TAKE 1 TABLET BY MOUTH TWICE A DAY 180 tablet 3  . Cholecalciferol (VITAMIN D) 2000 units CAPS Take by mouth daily.    . dapagliflozin propanediol (FARXIGA) 10 MG TABS tablet Take 1 tablet (10 mg total) by mouth daily. Needs appt 90 tablet 0  . fish oil-omega-3 fatty acids 1000 MG capsule Take 1 capsule (1 g total) by mouth daily. 30 capsule 2  . Multiple Vitamin (MULTIVITAMIN) tablet Take 1 tablet by mouth daily.    . potassium chloride SA (KLOR-CON) 20 MEQ tablet Take 1 tablet (20 mEq total) by mouth daily. 90 tablet 3  . sacubitril-valsartan (ENTRESTO) 97-103 MG Take 1 tablet by mouth 2 (two) times daily. Please call to schedule an appointment for future refills. 1st attempt 60  tablet 3  . spironolactone (ALDACTONE) 25 MG tablet Take 1 tablet (25 mg total) by mouth daily. 90 tablet 1  . torsemide (DEMADEX) 20 MG tablet Take 2 tablets (40 mg total) by mouth daily. 180 tablet 3  . simvastatin (ZOCOR) 40 MG tablet Take 1 tablet (40 mg total) by mouth every evening. 90 tablet 3   No facility-administered medications prior  to visit.    No Known Allergies  ROS Review of Systems All review of systems negative except what is listed in the HPI    Objective:    Physical Exam Constitutional:      Appearance: Normal appearance. He is obese.  HENT:     Head: Normocephalic.  Eyes:     Extraocular Movements: Extraocular movements intact.     Conjunctiva/sclera: Conjunctivae normal.     Pupils: Pupils are equal, round, and reactive to light.  Cardiovascular:     Rate and Rhythm: Normal rate and regular rhythm.     Pulses: Normal pulses.     Heart sounds: Normal heart sounds.  Pulmonary:     Effort: Pulmonary effort is normal.     Breath sounds: Normal breath sounds.  Musculoskeletal:        General: Normal range of motion.     Right lower leg: No edema.     Left lower leg: No edema.  Skin:    General: Skin is warm and dry.     Capillary Refill: Capillary refill takes less than 2 seconds.  Neurological:     General: No focal deficit present.     Mental Status: He is alert and oriented to person, place, and time.  Psychiatric:        Mood and Affect: Mood normal.        Behavior: Behavior normal.        Thought Content: Thought content normal.        Judgment: Judgment normal.     BP 116/62   Pulse 79   Ht '6\' 1"'  (1.854 m)   Wt (!) 348 lb 9.6 oz (158.1 kg)   SpO2 98%   BMI 45.99 kg/m  Wt Readings from Last 3 Encounters:  08/28/20 (!) 348 lb 9.6 oz (158.1 kg)  08/18/20 (!) 348 lb 9.6 oz (158.1 kg)  11/09/19 (!) 356 lb (161.5 kg)     Health Maintenance Due  Topic Date Due  . OPHTHALMOLOGY EXAM  Never done  . FOOT EXAM  08/28/2015    There  are no preventive care reminders to display for this patient.  Lab Results  Component Value Date   TSH 3.780 07/25/2018   Lab Results  Component Value Date   WBC 11.4 (H) 08/18/2020   HGB 14.4 08/18/2020   HCT 43.7 08/18/2020   MCV 87.9 08/18/2020   PLT 244 08/18/2020   Lab Results  Component Value Date   NA 143 08/18/2020   K 3.5 08/18/2020   CO2 32 08/18/2020   GLUCOSE 102 (H) 08/18/2020   BUN 13 08/18/2020   CREATININE 1.19 08/18/2020   BILITOT 0.6 08/18/2020   ALKPHOS 76 08/18/2020   AST 19 08/18/2020   ALT 21 08/18/2020   PROT 7.5 08/18/2020   ALBUMIN 4.3 08/18/2020   CALCIUM 9.3 08/18/2020   ANIONGAP 10 08/18/2020   GFR 78.03 10/01/2013   Lab Results  Component Value Date   CHOL 188 08/18/2020   Lab Results  Component Value Date   HDL 33 (L) 08/18/2020   Lab Results  Component Value Date   LDLCALC 122 (H) 08/18/2020   Lab Results  Component Value Date   TRIG 165 (H) 08/18/2020   Lab Results  Component Value Date   CHOLHDL 5.7 08/18/2020   Lab Results  Component Value Date   HGBA1C 6.5 (H) 08/18/2020      Assessment & Plan:   Problem List Items Addressed This Visit  Cardiovascular and Mediastinum   Essential hypertension   Relevant Medications   rosuvastatin (CRESTOR) 20 MG tablet   Other Relevant Orders   Lipid panel   Comprehensive metabolic panel   HgB A3F     Endocrine   Type 2 diabetes mellitus with hyperglycemia, without long-term current use of insulin (HCC) - Primary   Relevant Medications   rosuvastatin (CRESTOR) 20 MG tablet   metFORMIN (GLUCOPHAGE) 500 MG tablet   blood glucose meter kit and supplies   Other Relevant Orders   Lipid panel   Comprehensive metabolic panel   HgB T7D     Other   Mixed hypercholesterolemia and hypertriglyceridemia   Relevant Medications   rosuvastatin (CRESTOR) 20 MG tablet   Other Relevant Orders   Lipid panel   Comprehensive metabolic panel   HgB U2G      Meds ordered this  encounter  Medications  . rosuvastatin (CRESTOR) 20 MG tablet    Sig: Take 1 tablet (20 mg total) by mouth daily.    Dispense:  30 tablet    Refill:  3  . metFORMIN (GLUCOPHAGE) 500 MG tablet    Sig: Take 57m (1 tab) with breakfast and dinner for 14 days then increase to 10046m(2 tabs) with breakfast and dinner for a full dose of 200079mer day.    Dispense:  106 tablet    Refill:  0  . blood glucose meter kit and supplies    Sig: Dispense based on patient and insurance preference. Check blood sugar every morning before eating and up to three additional times a day as needed (FOR ICD-10 E10.9, E11.9).    Dispense:  1 each    Refill:  0    Order Specific Question:   Number of strips    Answer:   99 50 Order Specific Question:   Number of lancets    Answer:   99    Follow-up: Return in about 3 months (around 11/27/2020) for DM/HLD.    SarOrma RenderP

## 2020-08-28 NOTE — Assessment & Plan Note (Signed)
BMI 45.99 at visit today.  Hopeful that addition of metformin and dietary alterations may help in weight loss efforts. He has been walking daily- recommend continue this and increase as tolerated to help with weight loss and chronic disease management.  He may benefit from GLP-1 injectable medication for diabetes control and weight loss if other aspects are not beneficial.  Follow-up in 3 months.

## 2020-08-28 NOTE — Patient Instructions (Addendum)
I would like you to start Metformin today for your blood sugars.  I would like you to take 1 tablet every morning with breakfast and 1 tablet every evening with dinner for one week then increase to 2 tablets every morning and every evening. If you have any issues or side effects of the medication, please let me know.   I would like you to check your blood sugars every morning before you eat and write these down for me so we can look at them at your next visit. These numbers will increase based on the several factors, but many times it is related to the food that you had the day before. If you see an increase with certain foods, try to cut these back or avoid them in the future. I have included a booklet for you to write down your numbers. Please bring this back with you to your visits so we can go over them together.   I have given you a packet with A LOT of information on diabetes, diet, exercise, and managing your blood sugars. Please take time to read over this information and let me know if you have any questions. We will work together to get your blood sugars to a great number and keep you in good control to protect your health.   Your cholesterol was elevated on the most recent labs. The cholesterol medication you are on is considered a "mid level" medication and is not working as well as it should. I would like for you to STOP the simvastatin and I have sent in a different medication called Rosuvastatin that I would like you to take at bedtime every night. This medication will give [name] better control of your cholesterol levels and help reduce the damage to your blood vessels and heart.    Recommendations for managing diabetes:  Yearly dilated eye exam to check for changes to your eyes from high blood sugars and help prevent blindness  Yearly foot exams to check for decreased sensation in your feet and slow healing wounds that may cause other complications.   Yearly urine checks to make sure  your kidneys are working like they should and make sure you are not spilling protein and sugar.   Follow-up visits every 3 to 6 months to monitor for complications and control. We will plan to start with every 3 month check-ins and can move to every 6 months if your numbers look like they are well controlled.   Labs every 3-6 months to monitor control of your blood sugar, kidney function, and liver function.  High Cholesterol  High cholesterol is a condition in which the blood has high levels of a white, waxy substance similar to fat (cholesterol). The liver makes all the cholesterol that the body needs. The human body needs small amounts of cholesterol to help build cells. A person gets extra or excess cholesterol from the food that he or she eats. The blood carries cholesterol from the liver to the rest of the body. If you have high cholesterol, deposits (plaques) may build up on the walls of your arteries. Arteries are the blood vessels that carry blood away from your heart. These plaques make the arteries narrow and stiff. Cholesterol plaques increase your risk for heart attack and stroke. Work with your health care provider to keep your cholesterol levels in a healthy range. What increases the risk? The following factors may make you more likely to develop this condition:  Eating foods that are high  in animal fat (saturated fat) or cholesterol.  Being overweight.  Not getting enough exercise.  A family history of high cholesterol (familial hypercholesterolemia).  Use of tobacco products.  Having diabetes. What are the signs or symptoms? There are no symptoms of this condition. How is this diagnosed? This condition may be diagnosed based on the results of a blood test.  If you are older than 57 years of age, your health care provider may check your cholesterol levels every 4-6 years.  You may be checked more often if you have high cholesterol or other risk factors for heart  disease. The blood test for cholesterol measures:  "Bad" cholesterol, or LDL cholesterol. This is the main type of cholesterol that causes heart disease. The desired level is less than 100 mg/dL.  "Good" cholesterol, or HDL cholesterol. HDL helps protect against heart disease by cleaning the arteries and carrying the LDL to the liver for processing. The desired level for HDL is 60 mg/dL or higher.  Triglycerides. These are fats that your body can store or burn for energy. The desired level is less than 150 mg/dL.  Total cholesterol. This measures the total amount of cholesterol in your blood and includes LDL, HDL, and triglycerides. The desired level is less than 200 mg/dL. How is this treated? This condition may be treated with:  Diet changes. You may be asked to eat foods that have more fiber and less saturated fats or added sugar.  Lifestyle changes. These may include regular exercise, maintaining a healthy weight, and quitting use of tobacco products.  Medicines. These are given when diet and lifestyle changes have not worked. You may be prescribed a statin medicine to help lower your cholesterol levels. Follow these instructions at home: Eating and drinking  Eat a healthy, balanced diet. This diet includes: ? Daily servings of a variety of fresh, frozen, or canned fruits and vegetables. ? Daily servings of whole grain foods that are rich in fiber. ? Foods that are low in saturated fats and trans fats. These include poultry and fish without skin, lean cuts of meat, and low-fat dairy products. ? A variety of fish, especially oily fish that contain omega-3 fatty acids. Aim to eat fish at least 2 times a week.  Avoid foods and drinks that have added sugar.  Use healthy cooking methods, such as roasting, grilling, broiling, baking, poaching, steaming, and stir-frying. Do not fry your food except for stir-frying.   Lifestyle  Get regular exercise. Aim to exercise for a total of 150  minutes a week. Increase your activity level by doing activities such as gardening, walking, and taking the stairs.  Do not use any products that contain nicotine or tobacco, such as cigarettes, e-cigarettes, and chewing tobacco. If you need help quitting, ask your health care provider.   General instructions  Take over-the-counter and prescription medicines only as told by your health care provider.  Keep all follow-up visits as told by your health care provider. This is important. Where to find more information  American Heart Association: www.heart.org  National Heart, Lung, and Blood Institute: https://wilson-eaton.com/ Contact a health care provider if:  You have trouble achieving or maintaining a healthy diet or weight.  You are starting an exercise program.  You are unable to stop smoking. Get help right away if:  You have chest pain.  You have trouble breathing.  You have any symptoms of a stroke. "BE FAST" is an easy way to remember the main warning signs of a  stroke: ? B - Balance. Signs are dizziness, sudden trouble walking, or loss of balance. ? E - Eyes. Signs are trouble seeing or a sudden change in vision. ? F - Face. Signs are sudden weakness or numbness of the face, or the face or eyelid drooping on one side. ? A - Arms. Signs are weakness or numbness in an arm. This happens suddenly and usually on one side of the body. ? S - Speech. Signs are sudden trouble speaking, slurred speech, or trouble understanding what people say. ? T - Time. Time to call emergency services. Write down what time symptoms started.  You have other signs of a stroke, such as: ? A sudden, severe headache with no known cause. ? Nausea or vomiting. ? Seizure. These symptoms may represent a serious problem that is an emergency. Do not wait to see if the symptoms will go away. Get medical help right away. Call your local emergency services (911 in the [place] Do not drive yourself to the  hospital. Summary  Cholesterol plaques increase your risk for heart attack and stroke. Work with your health care provider to keep your cholesterol levels in a healthy range.  Eat a healthy, balanced diet, get regular exercise, and maintain a healthy weight.  Do not use any products that contain nicotine or tobacco, such as cigarettes, e-cigarettes, and chewing tobacco.  Get help right away if you have any symptoms of a stroke. This information is not intended to replace advice given to you by your health care provider. Make sure you discuss any questions you have with your health care provider. Document Revised: 03/19/2019 Document Reviewed: 03/19/2019 Elsevier Patient Education  2021 [employer].     Why follow it? Research shows. . Those who follow the Mediterranean diet have a reduced risk of heart disease  . The diet is associated with a reduced incidence of Parkinson's and Alzheimer's diseases . People following the diet may have longer life expectancies and lower rates of chronic diseases  . The Dietary Guidelines for Americans recommends the Mediterranean diet as an eating plan to promote health and prevent disease  What Is the Mediterranean Diet?  . Healthy eating plan based on typical foods and recipes of Mediterranean-style cooking . The diet is primarily a plant based diet; these foods should make up a majority of meals   Starches - Plant based foods should make up a majority of meals - They are an important sources of vitamins, minerals, energy, antioxidants, and fiber - Choose whole grains, foods high in fiber and minimally processed items  - Typical grain sources include wheat, oats, barley, corn, brown rice, bulgar, farro, millet, polenta, couscous  - Various types of beans include chickpeas, lentils, fava beans, black beans, white beans   Fruits  Veggies - Large quantities of antioxidant rich fruits & veggies; 6 or more servings  - Vegetables can be eaten raw or  lightly drizzled with oil and cooked  - Vegetables common to the traditional Mediterranean Diet include: artichokes, arugula, beets, broccoli, brussel sprouts, cabbage, carrots, celery, collard greens, cucumbers, eggplant, kale, leeks, lemons, lettuce, mushrooms, okra, onions, peas, peppers, potatoes, pumpkin, radishes, rutabaga, shallots, spinach, sweet potatoes, turnips, zucchini - Fruits common to the Mediterranean Diet include: apples, apricots, avocados, cherries, clementines, dates, figs, grapefruits, grapes, melons, nectarines, oranges, peaches, pears, pomegranates, strawberries, tangerines  Fats - Replace butter and margarine with healthy oils, such as olive oil, canola oil, and tahini  - Limit nuts to no more than a  handful a day  - Nuts include walnuts, almonds, pecans, pistachios, pine nuts  - Limit or avoid candied, honey roasted or heavily salted nuts - Olives are central to the Mediterranean diet - can be eaten whole or used in a variety of dishes   Meats Protein - Limiting red meat: no more than a few times a month - When eating red meat: choose lean cuts and keep the portion to the size of deck of cards - Eggs: approx. 0 to 4 times a week  - Fish and lean poultry: at least 2 a week  - Healthy protein sources include, chicken, [place], lean beef, lamb - Increase intake of seafood such as tuna, salmon, trout, mackerel, shrimp, scallops - Avoid or limit high fat processed meats such as sausage and bacon  Dairy - Include moderate amounts of low fat dairy products  - Focus on healthy dairy such as fat free yogurt, skim milk, low or reduced fat cheese - Limit dairy products higher in fat such as whole or 2% milk, cheese, ice cream  Alcohol - Moderate amounts of red wine is ok  - No more than 5 oz daily for women (all ages) and men older than age 58  - No more than 10 oz of wine daily for men younger than 24  Other - Limit sweets and other desserts  - Use herbs and spices instead of  salt to flavor foods  - Herbs and spices common to the traditional Mediterranean Diet include: basil, bay leaves, chives, cloves, cumin, fennel, garlic, lavender, marjoram, mint, oregano, parsley, pepper, rosemary, sage, savory, sumac, tarragon, thyme   It's not just a diet, it's a lifestyle:  . The Mediterranean diet includes lifestyle factors typical of those in the region  . Foods, drinks and meals are best eaten with others and savored . Daily physical activity is important for overall good health . This could be strenuous exercise like running and aerobics . This could also be more leisurely activities such as walking, housework, yard-work, or taking the stairs . Moderation is the key; a balanced and healthy diet accommodates most foods and drinks . Consider portion sizes and frequency of consumption of certain foods   Meal Ideas & Options:  . Breakfast:  o Whole wheat toast or whole wheat English muffins with peanut butter & hard boiled egg o Steel cut oats topped with apples & cinnamon and skim milk  o Fresh fruit: banana, strawberries, melon, berries, peaches  o Smoothies: strawberries, bananas, greek yogurt, peanut butter o Low fat greek yogurt with blueberries and granola  o Egg white omelet with spinach and mushrooms o Breakfast couscous: whole wheat couscous, apricots, skim milk, cranberries  . Sandwiches:  o Hummus and grilled vegetables (peppers, zucchini, squash) on whole wheat bread   o Grilled chicken on whole wheat pita with lettuce, tomatoes, cucumbers or tzatziki  o Tuna salad on whole wheat bread: tuna salad made with greek yogurt, olives, red peppers, capers, green onions o Garlic rosemary lamb pita: lamb sauted with garlic, rosemary, salt & pepper; add lettuce, cucumber, greek yogurt to pita - flavor with lemon juice and black pepper  . Seafood:  o Mediterranean grilled salmon, seasoned with garlic, basil, parsley, lemon juice and black pepper o Shrimp, lemon, and  spinach whole-grain pasta salad made with low fat greek yogurt  o Seared scallops with lemon orzo  o Seared tuna steaks seasoned salt, pepper, coriander topped with tomato mixture of olives, tomatoes, olive oil, minced  garlic, parsley, green onions and cappers  . Meats:  o Herbed greek chicken salad with kalamata olives, cucumber, feta  o Red bell peppers stuffed with spinach, bulgur, lean ground beef (or lentils) & topped with feta   o Kebabs: skewers of chicken, tomatoes, onions, zucchini, squash  o [place] burgers: made with red onions, mint, dill, lemon juice, feta cheese topped with roasted red peppers . Vegetarian o Cucumber salad: cucumbers, artichoke hearts, celery, red onion, feta cheese, tossed in olive oil & lemon juice  o Hummus and whole grain pita points with a greek salad (lettuce, tomato, feta, olives, cucumbers, red onion) o Lentil soup with celery, carrots made with vegetable broth, garlic, salt and pepper  o Tabouli salad: parsley, bulgur, mint, scallions, cucumbers, tomato, radishes, lemon juice, olive oil, salt and pepper.       Fat and Cholesterol Restricted Eating Plan Getting too much fat and cholesterol in your diet may cause health problems. Choosing the right foods helps keep your fat and cholesterol at normal levels. This can keep you from getting certain diseases.  What are tips for following this plan? Meal planning  At meals, divide your plate into four equal parts: ? Fill one-half of your plate with vegetables and green salads. ? Fill one-fourth of your plate with whole grains. ? Fill one-fourth of your plate with low-fat (lean) protein foods.  Eat fish that is high in omega-3 fats at least two times a week. This includes mackerel, tuna, sardines, and salmon.  Eat foods that are high in fiber, such as whole grains, beans, apples, broccoli, carrots, peas, and barley. General tips  Work with your doctor to lose weight if you need to.  Avoid: ? Foods with  added sugar. ? Fried foods. ? Foods with partially hydrogenated oils.  Limit alcohol intake to no more than 1 drink a day for nonpregnant women and 2 drinks a day for men. One drink equals 12 oz of beer, 5 oz of wine, or 1 oz of hard liquor.   Reading food labels  Check food labels for: ? Trans fats. ? Partially hydrogenated oils. ? Saturated fat (g) in each serving. ? Cholesterol (mg) in each serving. ? Fiber (g) in each serving.  Choose foods with healthy fats, such as: ? Monounsaturated fats. ? Polyunsaturated fats. ? Omega-3 fats.  Choose grain products that have whole grains. Look for the word "whole" as the first word in the ingredient list. Cooking  Cook foods using low-fat methods. These include baking, boiling, grilling, and broiling.  Eat more home-cooked foods. Eat at restaurants and buffets less often.  Avoid cooking using saturated fats, such as butter, cream, palm oil, palm kernel oil, and coconut oil. Recommended foods Fruits  All fresh, canned (in natural juice), or frozen fruits. Vegetables  Fresh or frozen vegetables (raw, steamed, roasted, or grilled). Green salads. Grains  Whole grains, such as whole wheat or whole grain breads, crackers, cereals, and pasta. Unsweetened oatmeal, bulgur, barley, quinoa, or brown rice. Corn or whole wheat flour tortillas. Meats and other protein foods  Ground beef (85% or leaner), grass-fed beef, or beef trimmed of fat. Skinless chicken or [place]. Ground chicken or [place]. Pork trimmed of fat. All fish and seafood. Egg whites. Dried beans, peas, or lentils. Unsalted nuts or seeds. Unsalted canned beans. Nut butters without added sugar or oil. Dairy  Low-fat or nonfat dairy products, such as skim or 1% milk, 2% or reduced-fat cheeses, low-fat and fat-free ricotta or cottage cheese,  or plain low-fat and nonfat yogurt. Fats and oils  Tub margarine without trans fats. Light or reduced-fat mayonnaise and salad dressings.  Avocado. Olive, canola, sesame, or safflower oils. The items listed above may not be a complete list of foods and beverages you can eat. Contact a dietitian for more information.   Foods to avoid Fruits  Canned fruit in heavy syrup. Fruit in cream or butter sauce. Fried fruit. Vegetables  Vegetables cooked in cheese, cream, or butter sauce. Fried vegetables. Grains  White bread. White pasta. White rice. Cornbread. Bagels, pastries, and croissants. Crackers and snack foods that contain trans fat and hydrogenated oils. Meats and other protein foods  Fatty cuts of meat. Ribs, chicken wings, bacon, sausage, bologna, salami, chitterlings, fatback, hot dogs, bratwurst, and packaged lunch meats. Liver and organ meats. Whole eggs and egg yolks. Chicken and [place] with skin. Fried meat. Dairy  Whole or 2% milk, cream, half-and-half, and cream cheese. Whole milk cheeses. Whole-fat or sweetened yogurt. Full-fat cheeses. Nondairy creamers and whipped toppings. Processed cheese, cheese spreads, and cheese curds. Beverages  Alcohol. Sugar-sweetened drinks such as sodas, lemonade, and fruit drinks. Fats and oils  Butter, stick margarine, lard, shortening, ghee, or bacon fat. Coconut, palm kernel, and palm oils. Sweets and desserts  Corn syrup, sugars, honey, and molasses. Candy. Jam and jelly. Syrup. Sweetened cereals. Cookies, pies, cakes, donuts, muffins, and ice cream. The items listed above may not be a complete list of foods and beverages you should avoid. Contact a dietitian for more information. Summary  Choosing the right foods helps keep your fat and cholesterol at normal levels. This can keep you from getting certain diseases.  At meals, fill one-half of your plate with vegetables and green salads.  Eat high-fiber foods, like whole grains, beans, apples, carrots, peas, and barley.  Limit added sugar, saturated fats, alcohol, and fried foods. This information is not intended to replace  advice given to you by your health care provider. Make sure you discuss any questions you have with your health care provider. Document Revised: 08/22/2019 Document Reviewed: 08/22/2019 Elsevier Patient Education  2021 [employer].   Diabetes Mellitus and Standards of Medical Care Living with and managing diabetes (diabetes mellitus) can be complicated. Your diabetes treatment may be managed by a team of health care providers, including:  A physician who specializes in diabetes (endocrinologist). You might also have visits with a nurse practitioner or physician assistant.  Nurses.  A registered dietitian.  A certified diabetes care and education specialist.  An exercise specialist.  A pharmacist.  An eye doctor.  A foot specialist (podiatrist).  A dental care provider.  A primary care provider.  A mental health care provider. How to manage your diabetes You can do many things to successfully manage your diabetes. Your health care providers will follow guidelines to help you get the best quality of care. Here are general guidelines for your diabetes management plan. Your health care providers may give you more specific instructions. Physical exams When you are diagnosed with diabetes, and each year after that, your health care provider will ask about your medical and family history. You will have a physical exam, which may include:  Measuring your height, weight, and body mass index (BMI).  Checking your blood pressure. This will be done at every routine medical visit. Your target blood pressure may vary depending on your medical conditions, your age, and other factors.  A thyroid exam.  A skin exam.  Screening for nerve damage (  peripheral neuropathy). This may include checking the pulse in your legs and feet and the level of sensation in your hands and feet.  A foot exam to inspect the structure and skin of your feet, including checking for cuts, bruises, redness,  blisters, sores, or other problems.  Screening for blood vessel (vascular) problems. This may include checking the pulse in your legs and feet and checking your temperature. Blood tests Depending on your treatment plan and your personal needs, you may have the following tests:  Hemoglobin A1C (HbA1C). This test provides information about blood sugar (glucose) control over the previous 2-3 months. It is used to adjust your treatment plan, if needed. This test will be done: ? At least 2 times a year, if you are meeting your treatment goals. ? 4 times a year, if you are not meeting your treatment goals or if your goals have changed.  Lipid testing, including total cholesterol, LDL and HDL cholesterol, and triglyceride levels. ? The goal for LDL is less than 100 mg/dL (5.5 mmol/L). If you are at high risk for complications, the goal is less than 70 mg/dL (3.9 mmol/L). ? The goal for HDL is 40 mg/dL (2.2 mmol/L) or higher for men, and 50 mg/dL (2.8 mmol/L) or higher for women. An HDL cholesterol of 60 mg/dL (3.3 mmol/L) or higher gives some protection against heart disease. ? The goal for triglycerides is less than 150 mg/dL (8.3 mmol/L).  Liver function tests.  Kidney function tests.  Thyroid function tests.   Dental and eye exams  Visit your dentist two times a year.  If you have type 1 diabetes, your health care provider may recommend an eye exam within 5 years after you are diagnosed, and then once a year after your first exam. ? For children with type 1 diabetes, the health care provider may recommend an eye exam when your child is age 57 or older and has had diabetes for 3-5 years. After the first exam, your child should get an eye exam once a year.  If you have type 2 diabetes, your health care provider may recommend an eye exam as soon as you are diagnosed, and then every 1-2 years after your first exam.   Immunizations  A yearly flu (influenza) vaccine is recommended annually for  everyone 6 months or older. This is especially important if you have diabetes.  The pneumonia (pneumococcal) vaccine is recommended for everyone 2 years or older who has diabetes. If you are age 10 or older, you may get the pneumonia vaccine as a series of two separate shots.  The hepatitis B vaccine is recommended for adults shortly after being diagnosed with diabetes. Adults and children with diabetes should receive all other vaccines according to age-specific recommendations from the Centers for Disease Control and Prevention (CDC). Mental and emotional health Screening for symptoms of eating disorders, anxiety, and depression is recommended at the time of diagnosis and after as needed. If your screening shows that you have symptoms, you may need more evaluation. You may work with a mental health care provider. Follow these instructions at home: Treatment plan You will monitor your blood glucose levels and may give yourself insulin. Your treatment plan will be reviewed at every medical visit. You and your health care provider will discuss:  How you are taking your medicines, including insulin.  Any side effects you have.  Your blood glucose level target goals.  How often you monitor your blood glucose level.  Lifestyle habits, such as  activity level and tobacco, alcohol, and substance use. Education Your health care provider will assess how well you are monitoring your blood glucose levels and whether you are taking your insulin and medicines correctly. He or she may refer you to:  A certified diabetes care and education specialist to manage your diabetes throughout your life, starting at diagnosis.  A registered dietitian who can create and review your personal nutrition plan.  An exercise specialist who can discuss your activity level and exercise plan. General instructions  Take over-the-counter and prescription medicines only as told by your health care provider.  Keep all  follow-up visits. This is important. Where to find support There are many diabetes support networks, including:  American Diabetes Association (ADA): diabetes.org  Defeat Diabetes Foundation: defeatdiabetes.org Where to find more information  American Diabetes Association (ADA): www.diabetes.org  Association of Diabetes Care & Education Specialists (ADCES): diabeteseducator.org  International Diabetes Federation (IDF): https://www.munoz-bell.org/ Summary  Managing diabetes (diabetes mellitus) can be complicated. Your diabetes treatment may be managed by a team of health care providers.  Your health care providers follow guidelines to help you get the best quality care.  You should have physical exams, blood tests, blood pressure monitoring, immunizations, and screening tests regularly. Stay updated on how to manage your diabetes.  Your health care providers may also give you more specific instructions based on your individual health. This information is not intended to replace advice given to you by your health care provider. Make sure you discuss any questions you have with your health care provider. Document Revised: 10/25/2019 Document Reviewed: 10/25/2019 Elsevier Patient Education  2021 [place].   Diabetes Mellitus and [place] care is an important part of your health, especially when you have diabetes. Diabetes may cause you to have problems because of poor blood flow (circulation) to your feet and legs, which can cause your skin to:  Become thinner and drier.  Break more easily.  Heal more slowly.  Peel and crack. You may also have nerve damage (neuropathy) in your legs and feet, causing decreased feeling in them. This means that you may not notice minor injuries to your feet that could lead to more serious problems. Noticing and addressing any potential problems Kasmira Cacioppo is the best way to prevent future foot problems. How to care for your feet Foot hygiene  Wash your feet  daily with warm water and mild soap. Do not use hot water. Then, pat your feet and the areas between your toes until they are completely dry. Do not soak your feet as this can dry your skin.  Trim your toenails straight across. Do not dig under them or around the cuticle. File the edges of your nails with an emery board or nail file.  Apply a moisturizing lotion or petroleum jelly to the skin on your feet and to dry, brittle toenails. Use lotion that does not contain alcohol and is unscented. Do not apply lotion between your toes.   Shoes and socks  Wear clean socks or stockings every day. Make sure they are not too tight. Do not wear knee-high stockings since they may decrease blood flow to your legs.  Wear shoes that fit properly and have enough cushioning. Always look in your shoes before you put them on to be sure there are no objects inside.  To break in new shoes, wear them for just a few hours a day. This prevents injuries on your feet. Wounds, scrapes, corns, and calluses  Check your feet  daily for blisters, cuts, bruises, sores, and redness. If you cannot see the bottom of your feet, use a mirror or ask someone for help.  Do not cut corns or calluses or try to remove them with medicine.  If you find a minor scrape, cut, or break in the skin on your feet, keep it and the skin around it clean and dry. You may clean these areas with mild soap and water. Do not clean the area with peroxide, alcohol, or iodine.  If you have a wound, scrape, corn, or callus on your foot, look at it several times a day to make sure it is healing and not infected. Check for: ? Redness, swelling, or pain. ? Fluid or blood. ? Warmth. ? Pus or a bad smell.   General tips  Do not cross your legs. This may decrease blood flow to your feet.  Do not use heating pads or hot water bottles on your feet. They may burn your skin. If you have lost feeling in your feet or legs, you may not know this is happening  until it is too late.  Protect your feet from hot and cold by wearing shoes, such as at the beach or on hot pavement.  Schedule a complete foot exam at least once a year (annually) or more often if you have foot problems. Report any cuts, sores, or bruises to your health care provider immediately. Where to find more information  American Diabetes Association: www.diabetes.org  Association of Diabetes Care & Education Specialists: www.diabeteseducator.org Contact a health care provider if:  You have a medical condition that increases your risk of infection and you have any cuts, sores, or bruises on your feet.  You have an injury that is not healing.  You have redness on your legs or feet.  You feel burning or tingling in your legs or feet.  You have pain or cramps in your legs and feet.  Your legs or feet are numb.  Your feet always feel cold.  You have pain around any toenails. Get help right away if:  You have a wound, scrape, corn, or callus on your foot and: ? You have pain, swelling, or redness that gets worse. ? You have fluid or blood coming from the wound, scrape, corn, or callus. ? Your wound, scrape, corn, or callus feels warm to the touch. ? You have pus or a bad smell coming from the wound, scrape, corn, or callus. ? You have a fever. ? You have a red line going up your leg. Summary  Check your feet every day for blisters, cuts, bruises, sores, and redness.  Apply a moisturizing lotion or petroleum jelly to the skin on your feet and to dry, brittle toenails.  Wear shoes that fit properly and have enough cushioning.  If you have foot problems, report any cuts, sores, or bruises to your health care provider immediately.  Schedule a complete foot exam at least once a year (annually) or more often if you have foot problems. This information is not intended to replace advice given to you by your health care provider. Make sure you discuss any questions you have  with your health care provider. Document Revised: 11/08/2019 Document Reviewed: 11/08/2019 Elsevier Patient Education  [place].

## 2020-08-30 ENCOUNTER — Other Ambulatory Visit (HOSPITAL_COMMUNITY): Payer: Self-pay | Admitting: Adult Health

## 2020-09-08 ENCOUNTER — Telehealth (HOSPITAL_BASED_OUTPATIENT_CLINIC_OR_DEPARTMENT_OTHER): Payer: Self-pay

## 2020-09-08 ENCOUNTER — Ambulatory Visit: Payer: BC Managed Care – PPO | Attending: Internal Medicine

## 2020-09-08 DIAGNOSIS — Z20822 Contact with and (suspected) exposure to covid-19: Secondary | ICD-10-CM

## 2020-09-08 NOTE — Telephone Encounter (Signed)
Patient called because he may have been exposed to Covid-19 and states he is congested and stuffy.  He also she states he went to a [facility] facility to have a covid test done which he was told the results would been completed in 24 hours.  Advised him to take Mucinex and tylenol/ibuprofen for his symptoms.

## 2020-09-08 NOTE — Telephone Encounter (Signed)
Patient called because he may have been exposed to Covid-19 and states he is congested and stuffy.  He also she states he went to a [facility] facility to have a covid test done which he was told the results would been completed in 24 hours.  Advised him to take Mucinex and tylenol/ibuprofen for his symptoms. 

## 2020-09-09 LAB — NOVEL CORONAVIRUS, NAA: SARS-CoV-2, NAA: DETECTED — AB

## 2020-09-09 LAB — SARS-COV-2, NAA 2 DAY TAT

## 2020-10-01 ENCOUNTER — Other Ambulatory Visit (HOSPITAL_BASED_OUTPATIENT_CLINIC_OR_DEPARTMENT_OTHER): Payer: Self-pay | Admitting: Nurse Practitioner

## 2020-10-01 DIAGNOSIS — E1165 Type 2 diabetes mellitus with hyperglycemia: Secondary | ICD-10-CM

## 2020-10-01 MED ORDER — METFORMIN HCL 500 MG PO TABS: ORAL_TABLET | ORAL | 5 refills | Status: DC

## 2020-10-06 ENCOUNTER — Ambulatory Visit (INDEPENDENT_AMBULATORY_CARE_PROVIDER_SITE_OTHER): Payer: BC Managed Care – PPO

## 2020-10-06 DIAGNOSIS — I5022 Chronic systolic (congestive) heart failure: Secondary | ICD-10-CM

## 2020-10-06 DIAGNOSIS — Z95810 Presence of automatic (implantable) cardiac defibrillator: Secondary | ICD-10-CM | POA: Diagnosis not present

## 2020-10-08 ENCOUNTER — Telehealth: Payer: Self-pay

## 2020-10-08 NOTE — Telephone Encounter (Signed)
Opened in error

## 2020-10-08 NOTE — Progress Notes (Signed)
EPIC Encounter for ICM Monitoring  Patient Name: Gary Williamson is a 55 y.o. male Date: 10/08/2020 Primary Care Physican: [name], NP Primary Cardiologist: [name] Electrophysiologist: [name] Bi-V Pacing: >99% 08/18/2020 OfficeWeight:348lbs  AT/AF Burden: <1% (taking Aspirin)   Transmission reviewed.  CorvueThoracic impedancesuggestingnormal fluid levels.  Prescribed:  Torsemide20 mgtake 2tablets(40 mg total)daily.  Potassium 20 mEq take 1 tablet daily  Spironolactone 25 mg take 1 tablet daily  Labs: 08/18/2020 Creatinine 1.19, BUN 13, Potassium 3.5, Sodium 143, GFR >60 A complete set of results can be found in Results Review.  Recommendations: No changes  Follow-up plan: ICM clinic phone appointment on7/03/2021. 91 day device clinic remote transmission6/29/2022.   EP/Cardiology Office Visits: Recalls for 01/19/2020 with [name], NP and 1/5/2022with Dr. [name].   Copy of ICM check sent to Dr.[name]  3 month ICM trend: 10/04/2020.    [name], RN 10/08/2020 3:42 PM

## 2020-10-29 ENCOUNTER — Ambulatory Visit (INDEPENDENT_AMBULATORY_CARE_PROVIDER_SITE_OTHER): Payer: BC Managed Care – PPO

## 2020-10-29 DIAGNOSIS — I428 Other cardiomyopathies: Secondary | ICD-10-CM

## 2020-10-29 LAB — CUP PACEART REMOTE DEVICE CHECK
Battery Remaining Longevity: 13 mo
Battery Remaining Percentage: 14 %
Battery Voltage: 2.72 V
Brady Statistic AP VP Percent: 3.1 %
Brady Statistic AP VS Percent: 1 %
Brady Statistic AS VP Percent: 96 %
Brady Statistic AS VS Percent: 1 %
Brady Statistic RA Percent Paced: 3 %
Date Time Interrogation Session: 20220629020016
HighPow Impedance: 77 Ohm
HighPow Impedance: 77 Ohm
Implantable Lead Implant Date: 20150421
Implantable Lead Implant Date: 20150421
Implantable Lead Implant Date: 20150421
Implantable Lead Location: 753857
Implantable Lead Location: 753859
Implantable Lead Location: 753860
Implantable Pulse Generator Implant Date: 20150421
Lead Channel Impedance Value: 400 Ohm
Lead Channel Impedance Value: 510 Ohm
Lead Channel Impedance Value: 810 Ohm
Lead Channel Pacing Threshold Amplitude: 0.625 V
Lead Channel Pacing Threshold Amplitude: 0.75 V
Lead Channel Pacing Threshold Amplitude: 0.75 V
Lead Channel Pacing Threshold Pulse Width: 0.5 ms
Lead Channel Pacing Threshold Pulse Width: 0.5 ms
Lead Channel Pacing Threshold Pulse Width: 0.5 ms
Lead Channel Sensing Intrinsic Amplitude: 11.9 mV
Lead Channel Sensing Intrinsic Amplitude: 4.6 mV
Lead Channel Setting Pacing Amplitude: 2 V
Lead Channel Setting Pacing Amplitude: 2 V
Lead Channel Setting Pacing Amplitude: 2 V
Lead Channel Setting Pacing Pulse Width: 0.5 ms
Lead Channel Setting Pacing Pulse Width: 0.5 ms
Lead Channel Setting Sensing Sensitivity: 0.5 mV
Pulse Gen Serial Number: 7157119

## 2020-11-10 ENCOUNTER — Ambulatory Visit (INDEPENDENT_AMBULATORY_CARE_PROVIDER_SITE_OTHER): Payer: BC Managed Care – PPO

## 2020-11-10 DIAGNOSIS — Z95810 Presence of automatic (implantable) cardiac defibrillator: Secondary | ICD-10-CM | POA: Diagnosis not present

## 2020-11-10 DIAGNOSIS — I5022 Chronic systolic (congestive) heart failure: Secondary | ICD-10-CM

## 2020-11-12 NOTE — Progress Notes (Signed)
EPIC Encounter for ICM Monitoring  Patient Name: Gary Williamson is a 55 y.o. male Date: 11/12/2020 Primary Care Physican: [name], NP Primary Cardiologist: [name] Electrophysiologist: [name] Bi-V Pacing:  >99%     08/18/2020 Office Weight: 348 lbs   AT/AF Burden: <1% (taking Aspirin)     Transmission reviewed.    Corvue Thoracic impedance suggesting normal fluid levels.     Prescribed: Torsemide 20 mg take 2 tablets (40 mg total) daily. Potassium 20 mEq take 1 tablet daily Spironolactone 25 mg take 1 tablet daily   Labs: 08/18/2020 Creatinine 1.19, BUN 13, Potassium 3.5, Sodium 143, GFR >60 A complete set of results can be found in Results Review.   Recommendations:  No changes   Follow-up plan: ICM clinic phone appointment on 12/15/2020.   91 day device clinic remote transmission 01/28/2021.     EP/Cardiology Office Visits:   Recalls for 01/19/2020 with [name], NP and 05/07/2020 with Dr. [name].     Copy of ICM check sent to Dr. [name] .   3 month ICM trend: 11/10/2020.    1 Year ICM trend:       [name], RN 11/12/2020 4:22 PM

## 2020-11-18 NOTE — Progress Notes (Signed)
Remote ICD transmission.   

## 2020-11-27 ENCOUNTER — Encounter (HOSPITAL_BASED_OUTPATIENT_CLINIC_OR_DEPARTMENT_OTHER): Payer: Self-pay | Admitting: Nurse Practitioner

## 2020-11-27 ENCOUNTER — Other Ambulatory Visit: Payer: Self-pay

## 2020-11-27 ENCOUNTER — Ambulatory Visit (HOSPITAL_BASED_OUTPATIENT_CLINIC_OR_DEPARTMENT_OTHER): Payer: BC Managed Care – PPO | Admitting: Nurse Practitioner

## 2020-11-27 ENCOUNTER — Telehealth (HOSPITAL_BASED_OUTPATIENT_CLINIC_OR_DEPARTMENT_OTHER): Payer: Self-pay | Admitting: Nurse Practitioner

## 2020-11-27 VITALS — BP 121/62 | HR 87 | Ht 73.0 in | Wt 343.8 lb

## 2020-11-27 DIAGNOSIS — I428 Other cardiomyopathies: Secondary | ICD-10-CM

## 2020-11-27 DIAGNOSIS — E782 Mixed hyperlipidemia: Secondary | ICD-10-CM | POA: Diagnosis not present

## 2020-11-27 DIAGNOSIS — E876 Hypokalemia: Secondary | ICD-10-CM

## 2020-11-27 DIAGNOSIS — Z23 Encounter for immunization: Secondary | ICD-10-CM

## 2020-11-27 DIAGNOSIS — I5022 Chronic systolic (congestive) heart failure: Secondary | ICD-10-CM

## 2020-11-27 DIAGNOSIS — I10 Essential (primary) hypertension: Secondary | ICD-10-CM | POA: Diagnosis not present

## 2020-11-27 DIAGNOSIS — E1165 Type 2 diabetes mellitus with hyperglycemia: Secondary | ICD-10-CM | POA: Diagnosis not present

## 2020-11-27 LAB — POCT UA - MICROALBUMIN
Creatinine, POC: 300 mg/dL
Microalbumin Ur, POC: 80 mg/L

## 2020-11-27 LAB — POCT GLYCOSYLATED HEMOGLOBIN (HGB A1C): HbA1c POC (<> result, manual entry): 5 % (ref 4.0–5.6)

## 2020-11-27 MED ORDER — POTASSIUM CHLORIDE CRYS ER 20 MEQ PO TBCR: 20.0000 meq | EXTENDED_RELEASE_TABLET | Freq: Every day | ORAL | 0 refills | Status: DC

## 2020-11-27 MED ORDER — ZOSTER VAC RECOMB ADJUVANTED 50 MCG/0.5ML IM SUSR: 0.5000 mL | Freq: Once | INTRAMUSCULAR | 1 refills | Status: AC

## 2020-11-27 NOTE — Assessment & Plan Note (Signed)
No alarm signs- taking medication as prescribed No changes today F/U in 3 months

## 2020-11-27 NOTE — Assessment & Plan Note (Signed)
Stable- BP 121/62 today No alarm signs present A1c 5.0% No changes to medications F/U in 3 months

## 2020-11-27 NOTE — Assessment & Plan Note (Signed)
A1c 5.0% today Patient praised for amazing efforts Continue to walk daily and monitor diet No changes to medications F/U in 3 months- if control continues will move to every 6 month monitoring

## 2020-11-27 NOTE — Assessment & Plan Note (Signed)
Stable- no changes at this time Continue medications as directed F/U 3 months

## 2020-11-27 NOTE — Patient Instructions (Signed)
Your blood sugars are looking fantastic! I am very proud of you.   Keep working on walking at least 20 minutes a day and following a low carbohydrate, low fat diet.

## 2020-11-27 NOTE — Progress Notes (Signed)
Established Patient Office Visit  Subjective:  Patient ID: Gary Williamson, male    DOB: February 27, 1966  Age: 55 y.o. MRN: [id number]  CC:  Chief Complaint  Patient presents with   Diabetes    HPI [facility] presents for f/u DM, HTN, HLD  DIABETES Current Medications:Metformin, Farxiga Medication Side Effects: Feeling a little sluggish Taking medications as prescribed:  Yes  Glucose Monitoring: Yes   Frequency: QAM  Average Fasting BG: Low 100's on average Blood Pressure Monitoring:  not checking  Hypoglycemic episodes:no Polydipsia/polyuria: no Visual changes: no Chest pain: no Paresthesias: no Diabetic Diet: yes Exercise:  walking daily  Retinal Examination:  April 2022- [place] Exam: Due Today Urine Microalbumin: Due Today Pneumovax: Up to Date Influenza: Up to Date COVID:Up to Date  Last A1c: Current A1c:  Lab Results  Component Value Date   HGBA1C 5.0 11/27/2020   Weight:  Wt Readings from Last 3 Encounters:  11/27/20 (!) 343 lb 12.8 oz (155.9 kg)  08/28/20 (!) 348 lb 9.6 oz (158.1 kg)  08/18/20 (!) 348 lb 9.6 oz (158.1 kg)   BMI: Estimated body mass index is 45.36 kg/m as calculated from the following:   Height as of this encounter: '6\' 1"'$  (1.854 m).   Weight as of this encounter: 343 lb 12.8 oz (155.9 kg). Cholesterol:  Lab Results  Component Value Date   CHOL 188 08/18/2020   HDL 33 (L) 08/18/2020   LDLCALC 122 (H) 08/18/2020   TRIG 165 (H) 08/18/2020   CHOLHDL 5.7 08/18/2020   Kidney Fx:  Lab Results  Component Value Date   CREATININE 1.19 08/18/2020   BUN 13 08/18/2020   NA 143 08/18/2020   K 3.5 08/18/2020   CL 101 08/18/2020   CO2 32 08/18/2020   BP:  BP Readings from Last 3 Encounters:  11/27/20 121/62  08/28/20 116/62  08/18/20 139/82    Next A1c due: 3 months  HYPERLIPIDEMIA Hyperlipidemia status: excellent compliance Satisfied with current treatment?  yes Side effects:  no Medication compliance: excellent  compliance Supplements: none Aspirin:  yes The 10-year ASCVD risk score Mikey Bussing DC Jr., et al., 2013) is: 20.7%   Values used to calculate the score:     Age: 77 years     Sex: Male     Is Non-Hispanic African American: Yes     Diabetic: Yes     Tobacco smoker: No     Systolic Blood Pressure: 123XX123 mmHg     Is BP treated: Yes     HDL Cholesterol: 33 mg/dL     Total Cholesterol: 188 mg/dL Chest pain:  no Coronary artery disease:  yes Family history CAD:  yes Family history Shaheer Bonfield CAD:  yes  HYPERTENSION Hypertension status: controlled  Satisfied with current treatment? yes Duration of hypertension: chronic BP monitoring frequency:  not checking BP medication side effects:  no Medication compliance: excellent compliance Aspirin: yes Recurrent headaches: no Visual changes: no Palpitations: no Dyspnea: no Chest pain: no Lower extremity edema: no Dizzy/lightheaded: no   ROS Review of Systems All review of systems negative except what is listed in the HPI    Objective:    Physical Exam Vitals and nursing note reviewed.  Constitutional:      Appearance: Normal appearance. He is obese.  HENT:     Head: Normocephalic and atraumatic.  Eyes:     Extraocular Movements: Extraocular movements intact.     Conjunctiva/sclera: Conjunctivae normal.     Pupils: Pupils are  equal, round, and reactive to light.  Neck:     Vascular: No carotid bruit.  Cardiovascular:     Rate and Rhythm: Normal rate and regular rhythm.     Pulses: Normal pulses.     Heart sounds: Normal heart sounds.  Pulmonary:     Effort: Pulmonary effort is normal.     Breath sounds: Normal breath sounds.  Abdominal:     General: Bowel sounds are normal.     Palpations: Abdomen is soft.  Musculoskeletal:        General: Normal range of motion.     Cervical back: Normal range of motion and neck supple.     Right lower leg: No edema.     Left lower leg: No edema.  Skin:    General: Skin is warm and dry.      Capillary Refill: Capillary refill takes less than 2 seconds.  Neurological:     General: No focal deficit present.     Mental Status: He is alert and oriented to person, place, and time.  Psychiatric:        Mood and Affect: Mood normal.        Behavior: Behavior normal.        Thought Content: Thought content normal.        Judgment: Judgment normal.    BP 121/62   Pulse 87   Ht '6\' 1"'$  (1.854 m)   Wt (!) 343 lb 12.8 oz (155.9 kg)   SpO2 94%   BMI 45.36 kg/m  Wt Readings from Last 3 Encounters:  11/27/20 (!) 343 lb 12.8 oz (155.9 kg)  08/28/20 (!) 348 lb 9.6 oz (158.1 kg)  08/18/20 (!) 348 lb 9.6 oz (158.1 kg)     Health Maintenance Due  Topic Date Due   OPHTHALMOLOGY EXAM  Never done   Zoster Vaccines- Shingrix (1 of 2) Never done   Pneumococcal Vaccine 6-20 Years old (2 - PCV) 04/03/2014   COVID-19 Vaccine (4 - Booster for Pfizer series) 05/18/2020    There are no preventive care reminders to display for this patient.  Lab Results  Component Value Date   TSH 3.780 07/25/2018   Lab Results  Component Value Date   WBC 11.4 (H) 08/18/2020   HGB 14.4 08/18/2020   HCT 43.7 08/18/2020   MCV 87.9 08/18/2020   PLT 244 08/18/2020   Lab Results  Component Value Date   NA 143 08/18/2020   K 3.5 08/18/2020   CO2 32 08/18/2020   GLUCOSE 102 (H) 08/18/2020   BUN 13 08/18/2020   CREATININE 1.19 08/18/2020   BILITOT 0.6 08/18/2020   ALKPHOS 76 08/18/2020   AST 19 08/18/2020   ALT 21 08/18/2020   PROT 7.5 08/18/2020   ALBUMIN 4.3 08/18/2020   CALCIUM 9.3 08/18/2020   ANIONGAP 10 08/18/2020   GFR 78.03 10/01/2013   Lab Results  Component Value Date   CHOL 188 08/18/2020   Lab Results  Component Value Date   HDL 33 (L) 08/18/2020   Lab Results  Component Value Date   LDLCALC 122 (H) 08/18/2020   Lab Results  Component Value Date   TRIG 165 (H) 08/18/2020   Lab Results  Component Value Date   CHOLHDL 5.7 08/18/2020   Lab Results  Component Value  Date   HGBA1C 5.0 11/27/2020      Assessment & Plan:   Problem List Items Addressed This Visit     Chronic systolic heart failure ([place])  Relevant Orders   POCT glycosylated hemoglobin (Hb A1C) (Completed)   POCT UA - Microalbumin (Completed)   Mixed hypercholesterolemia and hypertriglyceridemia   Relevant Orders   POCT glycosylated hemoglobin (Hb A1C) (Completed)   POCT UA - Microalbumin (Completed)   Essential hypertension   Relevant Orders   POCT glycosylated hemoglobin (Hb A1C) (Completed)   POCT UA - Microalbumin (Completed)   Type 2 diabetes mellitus with hyperglycemia, without long-term current use of insulin (HCC) - Primary   Relevant Orders   POCT glycosylated hemoglobin (Hb A1C) (Completed)   POCT UA - Microalbumin (Completed)   HM DIABETES FOOT EXAM (Completed)   NICM (nonischemic cardiomyopathy) ([place])   Relevant Orders   POCT glycosylated hemoglobin (Hb A1C) (Completed)   POCT UA - Microalbumin (Completed)   Vitamin D deficiency   Other Visit Diagnoses     Need for shingles vaccine       Relevant Medications   Zoster Vaccine Adjuvanted [facility]) injection   Hypokalemia       Relevant Medications   potassium chloride SA (KLOR-CON) 20 MEQ tablet       Meds ordered this encounter  Medications   Zoster Vaccine Adjuvanted [facility]) injection    Sig: Inject 0.5 mLs into the muscle once for 1 dose. Repeat in 2-6 months. Please fax confirmation of vaccination to Worthy Keeler, DNP at [phone]    Dispense:  0.5 mL    Refill:  1   potassium chloride SA (KLOR-CON) 20 MEQ tablet    Sig: Take 1 tablet (20 mEq total) by mouth daily. Needs appt for further refills    Dispense:  30 tablet    Refill:  0    Follow-up: Return in about 3 months (around 02/27/2021) for DM/HTN.    [name], NP

## 2020-11-27 NOTE — Telephone Encounter (Signed)
LVMTCB Re: making FU OV

## 2020-11-27 NOTE — Assessment & Plan Note (Signed)
Stable No edema, cough, rhonchi today Continue current medications F/U in 3 months

## 2020-12-15 ENCOUNTER — Ambulatory Visit (INDEPENDENT_AMBULATORY_CARE_PROVIDER_SITE_OTHER): Payer: BC Managed Care – PPO

## 2020-12-15 DIAGNOSIS — I5022 Chronic systolic (congestive) heart failure: Secondary | ICD-10-CM | POA: Diagnosis not present

## 2020-12-15 DIAGNOSIS — Z95810 Presence of automatic (implantable) cardiac defibrillator: Secondary | ICD-10-CM | POA: Diagnosis not present

## 2020-12-17 ENCOUNTER — Telehealth: Payer: Self-pay

## 2020-12-17 NOTE — Progress Notes (Signed)
EPIC Encounter for ICM Monitoring  Patient Name: Gary Williamson is a 55 y.o. male Date: 12/17/2020 Primary Care Physican: [name], NP Primary Cardiologist: [name] Electrophysiologist: [name] Bi-V Pacing:  >99%     08/18/2020 Office Weight: 348 lbs   AT/AF Burden: <1% (taking Aspirin)     Attempted call to patient and unable to reach.  Left detailed message per DPR regarding transmission. Transmission reviewed.    Corvue Thoracic impedance suggesting normal fluid levels.     Prescribed: Torsemide 20 mg take 2 tablets (40 mg total) daily. Potassium 20 mEq take 1 tablet daily Spironolactone 25 mg take 1 tablet daily   Labs: 08/18/2020 Creatinine 1.19, BUN 13, Potassium 3.5, Sodium 143, GFR >60 A complete set of results can be found in Results Review.   Recommendations:  Left voice mail with ICM number and encouraged to call if experiencing any fluid symptoms.   Follow-up plan: ICM clinic phone appointment on 01/19/2021.   91 day device clinic remote transmission 01/28/2021.     EP/Cardiology Office Visits:   Recalls for 01/19/2020 with [name], NP and 05/07/2020 with Dr. [name].  Message sent to EP scheduler 8/17 to contact patient to schedule EP OV.  Last EP visit was 01/19/2019.   Copy of ICM check sent to Dr. [name].   3 month ICM trend: 12/15/2020.    1 Year ICM trend:       [name], RN 12/17/2020 9:49 AM

## 2020-12-17 NOTE — Telephone Encounter (Signed)
Remote ICM transmission received.  Attempted call to patient regarding ICM remote transmission and left detailed message per DPR.  Advised to return call for any fluid symptoms or questions. Next ICM remote transmission scheduled 01/19/2021.

## 2020-12-19 ENCOUNTER — Other Ambulatory Visit (HOSPITAL_BASED_OUTPATIENT_CLINIC_OR_DEPARTMENT_OTHER): Payer: Self-pay | Admitting: Nurse Practitioner

## 2020-12-24 NOTE — Progress Notes (Signed)
Electrophysiology Office Note Date: 12/25/2020  ID:  Gary Williamson, DOB 09/14/1965, MRN [id number]  PCP: Gary Render, NP Primary Cardiologist: None Electrophysiologist: Gary Grayer, MD   CC: Routine ICD follow-up  Gary Williamson is a 55 y.o. male seen today for Gary Grayer, MD for routine electrophysiology followup.  Since last being seen in our clinic the patient reports doing very well.  he denies chest pain, palpitations, dyspnea, PND, orthopnea, nausea, vomiting, dizziness, syncope, edema, weight gain, or early satiety. He has not had ICD shocks.   Device History: St. Jude BiV ICD implanted 2015 for chronic systolic CHF  Past Medical History:  Diagnosis Date   Acute idiopathic pericarditis 03/24/2010   Overview:  Viral Idiopathic Pericarditis    Chronic systolic heart failure ([place])    Colon cancer screening    Hematuria, microscopic 05/15/2013   05/15/2013 visit with Gary Rhodes, MD at [facility]. Normal CT scan. Recommended annual urine for microscopy and serum creatinine x 3 years. 04/20/2013 visit with Gary Rhodes, MD at [facility].    Hypertension    LBBB (left bundle branch block)    Chronic   Leg edema    NICM (nonischemic cardiomyopathy) ([place])    Probably nonischemic; LHC in 2002/03 no sig CAD per report (done in [place]). Cardiomyopathy for 9-10 yrs. 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 Echo 5/11: severe LVE, EF <20%, diff HK, mod dias dys, sev. LAE. Hosp for CHF in 5/11. Ad. MV 6/11: EF 21%, mild per-infart ischemia. LHC (7/11): EF 15%, no CAD, LVEDP 22; Echo (10/11): EF 20-25%;  Echo (12/12):  EF 20%, mod LVH, Gr 1 DD, diff HK, mild LAE   OSA (obstructive sleep apnea)    cpap used   Prediabetes 08/18/2020   Pure hypercholesterolemia    Severe obesity ([place])    Vitamin D deficiency    Past Surgical History:  Procedure Laterality Date   BI-VENTRICULAR IMPLANTABLE CARDIOVERTER DEFIBRILLATOR N/A 08/21/2013   Procedure:  BI-VENTRICULAR IMPLANTABLE CARDIOVERTER DEFIBRILLATOR  (CRT-D);  Surgeon: Gary Mark, MD;  Location: [facility] CATH LAB;  Service: Cardiovascular;  Laterality: N/A;   CARDIAC CATHETERIZATION  2002 or 2003   COLONOSCOPY N/A 09/16/2014   Procedure: COLONOSCOPY;  Surgeon: Gary Shipper, MD;  Location: [facility] ENDOSCOPY;  Service: Endoscopy;  Laterality: N/A;   LEFT HEART CATH  11/13/09   Dr Gary Williamson    Current Outpatient Medications  Medication Sig Dispense Refill   aspirin 81 MG EC tablet Take 1 tablet (81 mg total) by mouth daily. 30 tablet 2   blood glucose meter kit and supplies Dispense based on patient and insurance preference. Check blood sugar every morning before eating and up to three additional times a day as needed (FOR ICD-10 E10.9, E11.9). 1 each 0   carvedilol (COREG) 25 MG tablet TAKE 1 TABLET BY MOUTH TWICE A DAY 180 tablet 3   Cholecalciferol (VITAMIN D) 2000 units CAPS Take by mouth daily.     dapagliflozin propanediol (FARXIGA) 10 MG TABS tablet Take 1 tablet (10 mg total) by mouth daily. Needs appt 90 tablet 0   fish oil-omega-3 fatty acids 1000 MG capsule Take 1 capsule (1 g total) by mouth daily. 30 capsule 2   Lancets (ONETOUCH DELICA PLUS [id number]) MISC USE 1 LANCET TO CHECK GLUCOSE EVERY MORNING BEFORE EATING AND UP TO 3 ADDITIONAL TIMES PER DAY AS NEEDED 100 each 1   metFORMIN (GLUCOPHAGE) 500 MG tablet Take 2 tabs (1051m) by mouth with breakfast  and 2 tabs (1022m) by mouth with dinner daily. Full dose of 20044mper day. 120 tablet 5   Multiple Vitamin (MULTIVITAMIN) tablet Take 1 tablet by mouth daily.     ONETOUCH VERIO test strip USE 1 STRIP TO CHECK BLOOD SUGAR EVERY MORNING BEFORE EATING AND UP TO 3 ADDITIONAL TIMES PER DAY AS NEEDED 100 each 0   potassium chloride SA (KLOR-CON) 20 MEQ tablet Take 1 tablet (20 mEq total) by mouth daily. Needs appt for further refills 30 tablet 0   rosuvastatin (CRESTOR) 20 MG tablet Take 1 tablet (20 mg total) by mouth daily. 30 tablet 3    sacubitril-valsartan (ENTRESTO) 97-103 MG Take 1 tablet by mouth 2 (two) times daily. Please call to schedule an appointment for future refills. 1st attempt 60 tablet 3   spironolactone (ALDACTONE) 25 MG tablet Take 1 tablet (25 mg total) by mouth daily. 90 tablet 1   torsemide (DEMADEX) 20 MG tablet Take 2 tablets (40 mg total) by mouth daily. 180 tablet 3   No current facility-administered medications for this visit.    Allergies:   Patient has no known allergies.   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  Lots of traveling.       Tobacco: none      Alcohol:  1 drink per week      Exercise:  Walking three days per week; 20 minutes.       Seatbelt: 100%   Social Determinants of He[employer] broadcast assistanttrain: Not on file  Food Insecurity: Not on file  Transportation Needs: Not on file  Physical Activity: Not on file  Stress: Not on file  Social Connections: Not on file  Intimate Partner Violence: Not on file    Family History: Family History  Problem Relation Age of Onset   Hypertension Mother    Alzheimer's disease Mother    Hyperlipidemia Mother     Breast cancer Mother    Stroke Maternal Grandmother    Arthritis Father    Dementia Father    Hypertension Father    Alzheimer's disease Father    COPD Brother    Coronary artery disease Neg Hx    Heart failure Neg Hx    Heart disease Neg Hx     Review of Systems: All other systems reviewed and are otherwise negative except as noted above.   Physical Exam: Vitals:   12/25/20 0825  BP: 120/72  Pulse: 79  SpO2:  95%  Weight: (!) 343 lb (155.6 kg)  Height: '6\' 1"'  (1.854 m)     GEN- The patient is well appearing, alert and oriented x 3 today.   HEENT: normocephalic, atraumatic; sclera clear, conjunctiva pink; hearing intact; oropharynx clear; neck supple, no JVP Lymph- no cervical lymphadenopathy Lungs- Clear to ausculation bilaterally, normal work of breathing.  No wheezes, rales, rhonchi Heart- Regular rate and rhythm, no murmurs, rubs or gallops, PMI not laterally displaced GI- soft, non-tender, non-distended, bowel sounds present, no hepatosplenomegaly Extremities- no clubbing or cyanosis. No edema; DP/PT/radial pulses 2+ bilaterally MS- no significant deformity or atrophy Skin- warm and dry, no rash or lesion; ICD pocket well healed Psych- euthymic mood, full affect Neuro- strength and sensation are intact  ICD interrogation- reviewed in detail today,  See PACEART report  EKG:  EKG is ordered today. Personal review of EKG ordered today shows  Recent Labs: 08/18/2020: ALT 21; BUN 13; Creatinine, Ser 1.19; Hemoglobin 14.4; Platelets 244; Potassium 3.5; Sodium 143   Wt Readings from Last 3 Encounters:  12/25/20 (!) 343 lb (155.6 kg)  11/27/20 (!) 343 lb 12.8 oz (155.9 kg)  08/28/20 (!) 348 lb 9.6 oz (158.1 kg)     Other studies Reviewed: Additional studies/ records that were reviewed today include: AS VP at 79 bpm, BiV pacing with QRS at 134 ms   Assessment and Plan:  1.  Chronic systolic dysfunction s/p St. Jude CRT-D  Echo 11/2019 LVEF 40-45%, Grade 1 DD NYHA  II symptoms Volume status stable on exam today Stable on an appropriate medical regimen Normal ICD function See Pace Art report No changes today  2. HTN Stable on current regiment  3. Obesity Body mass index is 45.25 kg/m.  Lifestyle modification has been encouraged  4. OSA Per pt, insurance did not approve at home.  Current medicines are reviewed at length with the patient today.   The patient does not have concerns regarding his medicines.  The following changes were made today:  none   Disposition:   Follow up with EP APP  6 months. ~ 10 months until ERI. Will update Echo after next visit for planning.    Jacalyn Lefevre, PA-C  12/25/2020 8:29 AM  [facility] [address] [place] [address] [phone] (office) [phone] (fax)

## 2020-12-25 ENCOUNTER — Ambulatory Visit (INDEPENDENT_AMBULATORY_CARE_PROVIDER_SITE_OTHER): Payer: BC Managed Care – PPO | Admitting: Student

## 2020-12-25 ENCOUNTER — Encounter: Payer: Self-pay | Admitting: Student

## 2020-12-25 ENCOUNTER — Other Ambulatory Visit: Payer: Self-pay

## 2020-12-25 VITALS — BP 120/72 | HR 79 | Ht 73.0 in | Wt 343.0 lb

## 2020-12-25 DIAGNOSIS — I5022 Chronic systolic (congestive) heart failure: Secondary | ICD-10-CM | POA: Diagnosis not present

## 2020-12-25 DIAGNOSIS — G4733 Obstructive sleep apnea (adult) (pediatric): Secondary | ICD-10-CM

## 2020-12-25 LAB — CUP PACEART INCLINIC DEVICE CHECK
Battery Remaining Longevity: 10 mo
Brady Statistic RA Percent Paced: 3.3 %
Brady Statistic RV Percent Paced: 99.1 %
Date Time Interrogation Session: 20220825090453
HighPow Impedance: 65.25 Ohm
Implantable Lead Implant Date: 20150421
Implantable Lead Implant Date: 20150421
Implantable Lead Implant Date: 20150421
Implantable Lead Location: 753857
Implantable Lead Location: 753859
Implantable Lead Location: 753860
Implantable Pulse Generator Implant Date: 20150421
Lead Channel Impedance Value: 375 Ohm
Lead Channel Impedance Value: 475 Ohm
Lead Channel Impedance Value: 787.5 Ohm
Lead Channel Pacing Threshold Amplitude: 0.625 V
Lead Channel Pacing Threshold Amplitude: 1 V
Lead Channel Pacing Threshold Amplitude: 1 V
Lead Channel Pacing Threshold Amplitude: 1 V
Lead Channel Pacing Threshold Amplitude: 1 V
Lead Channel Pacing Threshold Pulse Width: 0.5 ms
Lead Channel Pacing Threshold Pulse Width: 0.5 ms
Lead Channel Pacing Threshold Pulse Width: 0.5 ms
Lead Channel Pacing Threshold Pulse Width: 0.5 ms
Lead Channel Pacing Threshold Pulse Width: 0.5 ms
Lead Channel Sensing Intrinsic Amplitude: 11.9 mV
Lead Channel Sensing Intrinsic Amplitude: 4.6 mV
Lead Channel Setting Pacing Amplitude: 2 V
Lead Channel Setting Pacing Amplitude: 2 V
Lead Channel Setting Pacing Amplitude: 2 V
Lead Channel Setting Pacing Pulse Width: 0.5 ms
Lead Channel Setting Pacing Pulse Width: 0.5 ms
Lead Channel Setting Sensing Sensitivity: 0.5 mV
Pulse Gen Serial Number: 7157119

## 2020-12-25 NOTE — Patient Instructions (Signed)

## 2021-01-08 ENCOUNTER — Encounter: Payer: Self-pay | Admitting: Internal Medicine

## 2021-01-19 ENCOUNTER — Telehealth: Payer: Self-pay

## 2021-01-19 ENCOUNTER — Ambulatory Visit (INDEPENDENT_AMBULATORY_CARE_PROVIDER_SITE_OTHER): Payer: BC Managed Care – PPO

## 2021-01-19 DIAGNOSIS — Z95810 Presence of automatic (implantable) cardiac defibrillator: Secondary | ICD-10-CM

## 2021-01-19 DIAGNOSIS — I5022 Chronic systolic (congestive) heart failure: Secondary | ICD-10-CM

## 2021-01-19 NOTE — Telephone Encounter (Signed)
Pt last seen at [facility] in 2016 for colon due to EF of 20-25 %.  Should pt be an OV or direct to [facility]?  I have not notified the pt yet. Thank you.

## 2021-01-19 NOTE — Progress Notes (Signed)
EPIC Encounter for ICM Monitoring  Patient Name: Gary Williamson is a 55 y.o. male Date: 01/19/2021 Primary Care Physican: [name], NP Primary Cardiologist: [name] Electrophysiologist: [name] Bi-V Pacing:  98%     12/25/2020 Office Weight: 343 lbs   AT/AF Burden: <1% (taking Aspirin) Battery Longevity: 8.7 months     Attempted call to patient and unable to reach.  Left detailed message per DPR regarding transmission. Transmission reviewed.    Corvue Thoracic impedance suggesting possible fluid accumulation starting 9/16.   Impedance pattern suggesting alternating fluid levels with days of possible fluid accumulation days followed by possible days of dryness.     Prescribed: Torsemide 20 mg take 2 tablets (40 mg total) daily. Potassium 20 mEq take 1 tablet daily Spironolactone 25 mg take 1 tablet daily   Labs: 08/18/2020 Creatinine 1.19, BUN 13, Potassium 3.5, Sodium 143, GFR >60 A complete set of results can be found in Results Review.   Recommendations:  Left voice mail with ICM number and encouraged to call if experiencing any fluid symptoms. If pt is reached, will send copy to Dr [name] for review and recommendations.   Follow-up plan: ICM clinic phone appointment on 01/27/2021 to recheck fluid levels.   91 day device clinic remote transmission 01/28/2021.     EP/Cardiology Office Visits:   Recalls for 06/23/2021 with Oda Kilts, [place]. 05/07/2020 with Dr. [name].     Copy of ICM check sent to Dr. [name].    3 month ICM trend: 01/19/2021.    1 Year ICM trend:       [name], RN 01/19/2021 2:58 PM

## 2021-01-19 NOTE — Telephone Encounter (Signed)
Remote ICM transmission received.  Attempted call to patient regarding ICM remote transmission and left detailed message per DPR.  Advised to return call for any fluid symptoms or questions. Next ICM remote transmission scheduled 01/27/2021.

## 2021-01-20 NOTE — Telephone Encounter (Signed)
See note below from Dr. Henrene Pastor, pt ok to have procedure in the [place].

## 2021-01-20 NOTE — Telephone Encounter (Signed)
Has had improvement in cardiac performance since his last exam. Last EF 40-45 %. Cardiology note below from last month (12-25-20). He is now OK for the [place]. Thanks   Assessment and Plan:   1.  Chronic systolic dysfunction s/p St. Jude CRT-D  Echo 11/2019 LVEF 40-45%, Grade 1 DD NYHA II symptoms Volume status stable on exam today Stable on an appropriate medical regimen Normal ICD function See Pace Art report No changes today   2. HTN Stable on current regiment   3. Obesity Body mass index is 45.25 kg/m.  Lifestyle modification has been encouraged   4. OSA Per pt, insurance did not approve at home.

## 2021-01-23 NOTE — Telephone Encounter (Signed)
Per Di Kindle chart is complete.

## 2021-01-27 ENCOUNTER — Ambulatory Visit (INDEPENDENT_AMBULATORY_CARE_PROVIDER_SITE_OTHER): Payer: BC Managed Care – PPO

## 2021-01-27 DIAGNOSIS — I5022 Chronic systolic (congestive) heart failure: Secondary | ICD-10-CM

## 2021-01-27 DIAGNOSIS — Z95810 Presence of automatic (implantable) cardiac defibrillator: Secondary | ICD-10-CM

## 2021-01-27 NOTE — Progress Notes (Signed)
EPIC Encounter for ICM Monitoring  Patient Name: Gary Williamson is a 55 y.o. male Date: 01/27/2021 Primary Care Physican: [name], NP Primary Cardiologist: [name] Electrophysiologist: [name] Bi-V Pacing:  98%     12/25/2020 Office Weight: 343 lbs   AT/AF Burden: <1% (taking Aspirin) Battery Longevity: 7.7 months     Transmission reviewed.    Corvue Thoracic impedance suggesting fluid levels returned to normal.   Impedance pattern suggesting alternating fluid levels with days of possible fluid accumulation days followed by possible days of dryness.     Prescribed: Torsemide 20 mg take 2 tablets (40 mg total) daily. Potassium 20 mEq take 1 tablet daily Spironolactone 25 mg take 1 tablet daily   Labs: 08/18/2020 Creatinine 1.19, BUN 13, Potassium 3.5, Sodium 143, GFR >60 A complete set of results can be found in Results Review.   Recommendations: No changes.   Follow-up plan: ICM clinic phone appointment on 02/23/2021.   91 day device clinic remote transmission 01/28/2021.     EP/Cardiology Office Visits:   Recalls for 06/23/2021 with Oda Kilts, [place]. 05/07/2020 with Dr. [name].     Copy of ICM check sent to Dr. [name].     3 month ICM trend: 01/27/2021.    1 Year ICM trend:       [name], RN 01/27/2021 8:22 AM

## 2021-01-30 ENCOUNTER — Encounter: Payer: Self-pay | Admitting: Internal Medicine

## 2021-01-30 ENCOUNTER — Ambulatory Visit (AMBULATORY_SURGERY_CENTER): Payer: BC Managed Care – PPO | Admitting: *Deleted

## 2021-01-30 ENCOUNTER — Other Ambulatory Visit: Payer: Self-pay

## 2021-01-30 VITALS — Ht 73.0 in | Wt 340.0 lb

## 2021-01-30 DIAGNOSIS — Z86010 Personal history of colonic polyps: Secondary | ICD-10-CM

## 2021-01-30 MED ORDER — PEG-KCL-NACL-NASULF-NA ASC-C 100 G PO SOLR: 1.0000 | Freq: Once | ORAL | 0 refills | Status: AC

## 2021-01-30 NOTE — Progress Notes (Signed)
No egg or soy allergy known to patient  No issues known to pt with past sedation with any surgeries or procedures Patient denies ever being told they had issues or difficulty with intubation  No FH of Malignant Hyperthermia Pt is not on diet pills Pt is not on  home 02  Pt is not on blood thinners  Pt denies issues with constipation  No A fib or A flutter  EMMI video to pt or via MyChart   Pt is fully vaccinated  for Covid  Due to the COVID-19 pandemic we are asking patients to follow certain guidelines.  Pt aware of COVID protocols and [facility] guidelines  Pt verified name, DOB, address and insurance during PV today.  Pt mailed instruction packet of Emmi video, copy of consent form to read and not return, and instructions.  PV completed over the phone.  Pt encouraged to call with questions or issues.  My Chart instructions to pt as well

## 2021-02-03 ENCOUNTER — Telehealth: Payer: Self-pay | Admitting: Internal Medicine

## 2021-02-03 DIAGNOSIS — Z86010 Personal history of colonic polyps: Secondary | ICD-10-CM

## 2021-02-03 MED ORDER — PEG 3350-KCL-NA BICARB-NACL 420 G PO SOLR: 4000.0000 mL | Freq: Once | ORAL | 0 refills | Status: AC

## 2021-02-03 NOTE — Telephone Encounter (Signed)
Pt states Movi prep not covered by [employer]- Only Peg 3350  sent in script - new instructions to pt via My Chart   Pt states he does not want Golytely- he will do the Movi as directed

## 2021-02-03 NOTE — Telephone Encounter (Signed)
Inbound call from patient. Calling in to states Moviprep is not covered by [employer]. Peg 3350 is covered

## 2021-02-04 LAB — CUP PACEART REMOTE DEVICE CHECK
Battery Remaining Longevity: 8 mo
Battery Remaining Percentage: 9 %
Battery Voltage: 2.66 V
Brady Statistic AP VP Percent: 3.8 %
Brady Statistic AP VS Percent: 1 %
Brady Statistic AS VP Percent: 94 %
Brady Statistic AS VS Percent: 2 %
Brady Statistic RA Percent Paced: 3.6 %
Date Time Interrogation Session: 20220927020020
HighPow Impedance: 63 Ohm
HighPow Impedance: 63 Ohm
Implantable Lead Implant Date: 20150421
Implantable Lead Implant Date: 20150421
Implantable Lead Implant Date: 20150421
Implantable Lead Location: 753857
Implantable Lead Location: 753859
Implantable Lead Location: 753860
Implantable Pulse Generator Implant Date: 20150421
Lead Channel Impedance Value: 360 Ohm
Lead Channel Impedance Value: 430 Ohm
Lead Channel Impedance Value: 710 Ohm
Lead Channel Pacing Threshold Amplitude: 0.625 V
Lead Channel Pacing Threshold Amplitude: 1 V
Lead Channel Pacing Threshold Amplitude: 1 V
Lead Channel Pacing Threshold Pulse Width: 0.5 ms
Lead Channel Pacing Threshold Pulse Width: 0.5 ms
Lead Channel Pacing Threshold Pulse Width: 0.5 ms
Lead Channel Sensing Intrinsic Amplitude: 12 mV
Lead Channel Sensing Intrinsic Amplitude: 3.7 mV
Lead Channel Setting Pacing Amplitude: 2 V
Lead Channel Setting Pacing Amplitude: 2 V
Lead Channel Setting Pacing Amplitude: 2 V
Lead Channel Setting Pacing Pulse Width: 0.5 ms
Lead Channel Setting Pacing Pulse Width: 0.5 ms
Lead Channel Setting Sensing Sensitivity: 0.5 mV
Pulse Gen Serial Number: 7157119

## 2021-02-13 ENCOUNTER — Telehealth (HOSPITAL_BASED_OUTPATIENT_CLINIC_OR_DEPARTMENT_OTHER): Payer: Self-pay

## 2021-02-16 ENCOUNTER — Other Ambulatory Visit: Payer: Self-pay

## 2021-02-16 ENCOUNTER — Encounter: Payer: Self-pay | Admitting: Internal Medicine

## 2021-02-16 ENCOUNTER — Ambulatory Visit (AMBULATORY_SURGERY_CENTER): Payer: BC Managed Care – PPO | Admitting: Internal Medicine

## 2021-02-16 VITALS — BP 126/87 | HR 82 | Temp 98.6°F | Resp 14 | Ht 73.0 in | Wt 343.0 lb

## 2021-02-16 DIAGNOSIS — D128 Benign neoplasm of rectum: Secondary | ICD-10-CM

## 2021-02-16 DIAGNOSIS — D122 Benign neoplasm of ascending colon: Secondary | ICD-10-CM

## 2021-02-16 DIAGNOSIS — Z86010 Personal history of colonic polyps: Secondary | ICD-10-CM

## 2021-02-16 MED ORDER — SODIUM CHLORIDE 0.9 % IV SOLN: 500.0000 mL | Freq: Once | INTRAVENOUS | Status: DC

## 2021-02-16 NOTE — Progress Notes (Signed)
VS by CW  I have reviewed the patient's medical history in detail and updated the computerized patient record.  

## 2021-02-16 NOTE — Patient Instructions (Signed)
Resume previous diet and continue current medications. Awaiting pathology results. Repeat Colonoscopy in 5 years for surveillance.  YOU HAD AN ENDOSCOPIC PROCEDURE TODAY AT [facility]:   Refer to the procedure report that was given to you for any specific questions about what was found during the examination.  If the procedure report does not answer your questions, please call your gastroenterologist to clarify.  If you requested that your care partner not be given the details of your procedure findings, then the procedure report has been included in a sealed envelope for you to review at your convenience later.  YOU SHOULD EXPECT: Some feelings of bloating in the abdomen. Passage of more gas than usual.  Walking can help get rid of the air that was put into your GI tract during the procedure and reduce the bloating. If you had a lower endoscopy (such as a colonoscopy or flexible sigmoidoscopy) you may notice spotting of blood in your stool or on the toilet paper. If you underwent a bowel prep for your procedure, you may not have a normal bowel movement for a few days.  Please Note:  You might notice some irritation and congestion in your nose or some drainage.  This is from the oxygen used during your procedure.  There is no need for concern and it should clear up in a day or so.  SYMPTOMS TO REPORT IMMEDIATELY:  Following lower endoscopy (colonoscopy or flexible sigmoidoscopy):  Excessive amounts of blood in the stool  Significant tenderness or worsening of abdominal pains  Swelling of the abdomen that is new, acute  Fever of 100F or higher  For urgent or emergent issues, a gastroenterologist can be reached at any hour by calling [phone]. Do not use MyChart messaging for urgent concerns.    DIET:  We do recommend a small meal at first, but then you may proceed to your regular diet.  Drink plenty of fluids but you should avoid alcoholic beverages for 24  hours.  ACTIVITY:  You should plan to take it easy for the rest of today and you should NOT DRIVE or use heavy machinery until tomorrow (because of the sedation medicines used during the test).    FOLLOW UP: Our staff will call the number listed on your records 48-72 hours following your procedure to check on you and address any questions or concerns that you may have regarding the information given to you following your procedure. If we do not reach you, we will leave a message.  We will attempt to reach you two times.  During this call, we will ask if you have developed any symptoms of COVID 19. If you develop any symptoms (ie: fever, flu-like symptoms, shortness of breath, cough etc.) before then, please call [phone].  If you test positive for Covid 19 in the 2 weeks post procedure, please call and report this information to [name].    If any biopsies were taken you will be contacted by phone or by letter within the next 1-3 weeks.  Please call us at [phone] if you have not heard about the biopsies in 3 weeks.    SIGNATURES/CONFIDENTIALITY: You and/or your care partner have signed paperwork which will be entered into your electronic medical record.  These signatures attest to the fact that that the information above on your After Visit Summary has been reviewed and is understood.  Full responsibility of the confidentiality of this discharge information lies with you and/or your care-partner.

## 2021-02-16 NOTE — Progress Notes (Signed)
Called to room to assist during endoscopic procedure.  Patient ID and intended procedure confirmed with present staff. Received instructions for my participation in the procedure from the performing physician.  

## 2021-02-16 NOTE — Progress Notes (Signed)
HISTORY OF PRESENT ILLNESS:  Gary Williamson is a 55 y.o. male with past medical history as listed below who presents today for surveillance colonoscopy.  Previous colonoscopy May 2016 with tubular adenomas.  No active complaints.  Chronic medical problems are stable  REVIEW OF SYSTEMS:  All non-GI ROS negative. Past Medical History:  Diagnosis Date   Acute idiopathic pericarditis 03/24/2010   Overview:  Viral Idiopathic Pericarditis    CHF (congestive heart failure) (H[facility])    Chronic systolic heart failure ([place])    Colon cancer screening    Hematuria, microscopic 05/15/2013   05/15/2013 visit with [name], MD at [facility]. Normal CT scan. Recommended annual urine for microscopy and serum creatinine x 3 years. 04/20/2013 visit with [name], MD at [facility].    Hypertension    LBBB (left bundle branch block)    Chronic   Leg edema    NICM (nonischemic cardiomyopathy) ([place])    Probably nonischemic; LHC in 2002/03 no sig CAD per report (done in [place]). Cardiomyopathy for 9-10 yrs. 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 Echo 5/11: severe LVE, EF <20%, diff HK, mod dias dys, sev. LAE. Hosp for CHF in 5/11. Ad. MV 6/11: EF 21%, mild per-infart ischemia. LHC (7/11): EF 15%, no CAD, LVEDP 22; Echo (10/11): EF 20-25%;  Echo (12/12):  EF 20%, mod LVH, Gr 1 DD, diff HK, mild LAE   OSA (obstructive sleep apnea)    cpap not  used   Prediabetes 08/18/2020   Pure hypercholesterolemia    Severe obesity ([place])    Sleep apnea    Vitamin D deficiency     Past Surgical History:  Procedure Laterality Date   BI-VENTRICULAR IMPLANTABLE CARDIOVERTER DEFIBRILLATOR N/A 08/21/2013   Procedure: BI-VENTRICULAR IMPLANTABLE CARDIOVERTER DEFIBRILLATOR  (CRT-D);  Surgeon: Coralyn Mark, MD;  Location: [facility] CATH LAB;  Service: Cardiovascular;  Laterality: N/A;   CARDIAC CATHETERIZATION  2002 or 2003   COLONOSCOPY N/A 09/16/2014   Procedure: COLONOSCOPY;  Surgeon: [name], MD;  Location: [facility];  Service: Endoscopy;  Laterality: N/A;   COLONOSCOPY     LEFT HEART CATH  11/13/2009   Dr [name]   POLYPECTOMY      Social History [facility] [place]  reports that he has never smoked. He has never used smokeless tobacco. He reports current alcohol use. He reports that he does not use drugs.  family history includes Alzheimer's disease in his father and mother; Arthritis in his father; Breast cancer in his mother; COPD in his brother; Dementia in his father; Hyperlipidemia in his mother; Hypertension in his father and mother; Stroke in his maternal grandmother.  No Known Allergies     PHYSICAL EXAMINATION:  Vital signs: BP (!) 145/89   Pulse 89   Temp 98.6 F (37 C)   Ht 6\' 1"  (1.854 m)   Wt (!) 343 lb (155.6 kg)   SpO2 96%   BMI 45.25 kg/m  General: Well-developed, well-nourished, no acute distress HEENT: Sclerae are anicteric, conjunctiva pink. Oral mucosa intact Lungs: Clear Heart: Regular Abdomen: soft, nontender, nondistended, no obvious ascites, no peritoneal signs, normal bowel sounds. No organomegaly. Extremities: No edema Psychiatric: alert and oriented x3. Cooperative     ASSESSMENT:  1.  History of adenomatous colon polyps.  Due for surveillance   PLAN:  1.  Surveillance colonoscopy

## 2021-02-16 NOTE — Op Note (Signed)
[place] Patient Name: Gary Williamson Procedure Date: 02/16/2021 8:41 AM MRN: [id number] Endoscopist: Docia Chuck. Henrene Pastor , MD Age: 55 Referring MD:  Date of Birth: [birth year] Gender: Male Account #: [id number] Procedure:                Colonoscopy with cold snare polypectomy x 2 Indications:              High risk colon cancer surveillance: Personal                            history of non-advanced adenomas. May 2016 Medicines:                Monitored Anesthesia Care Procedure:                Pre-Anesthesia Assessment:                           - Prior to the procedure, a History and Physical                            was performed, and patient medications and                            allergies were reviewed. The patient's tolerance of                            previous anesthesia was also reviewed. The risks                            and benefits of the procedure and the sedation                            options and risks were discussed with the patient.                            All questions were answered, and informed consent                            was obtained. Prior Anticoagulants: The patient has                            taken no previous anticoagulant or antiplatelet                            agents. ASA Grade Assessment: II - A patient with                            mild systemic disease. After reviewing the risks                            and benefits, the patient was deemed in                            satisfactory condition to undergo the procedure.  After obtaining informed consent, the colonoscope                            was passed under direct vision. Throughout the                            procedure, the patient's blood pressure, pulse, and                            oxygen saturations were monitored continuously. The                            CF [id number] #[id number] was introduced through the anus                             and advanced to the the cecum, identified by                            appendiceal orifice and ileocecal valve. The                            ileocecal valve, appendiceal orifice, and rectum                            were photographed. The quality of the bowel                            preparation was excellent. The colonoscopy was                            performed without difficulty. The patient tolerated                            the procedure well. The bowel preparation used was                            MoviPrep via split dose instruction. Scope In: 9:01:43 AM Scope Out: 9:17:15 AM Scope Withdrawal Time: 0 hours 11 minutes 40 seconds  Total Procedure Duration: 0 hours 15 minutes 32 seconds  Findings:                 Two polyps were found in the rectum and ascending                            colon. The polyps were 4 to 5 mm in size. These                            polyps were removed with a cold snare. Resection                            and retrieval were complete.                           Multiple diverticula were found in  the left colon                            and right colon.                           The exam was otherwise without abnormality on                            direct and retroflexion views. Complications:            No immediate complications. Estimated blood loss:                            None. Estimated Blood Loss:     Estimated blood loss: none. Impression:               - Two 4 to 5 mm polyps in the rectum and in the                            ascending colon, removed with a cold snare.                            Resected and retrieved.                           - Diverticulosis in the left colon and in the right                            colon.                           - The examination was otherwise normal on direct                            and retroflexion views. Recommendation:           - Repeat colonoscopy in 5 years for  surveillance.                           - Patient has a contact number available for                            emergencies. The signs and symptoms of potential                            delayed complications were discussed with the                            patient. Return to normal activities tomorrow.                            Written discharge instructions were provided to the                            patient.                           -  Resume previous diet.                           - Continue present medications.                           - Await pathology results. Docia Chuck. Henrene Pastor, MD 02/16/2021 9:22:29 AM This report has been signed electronically.

## 2021-02-16 NOTE — Progress Notes (Signed)
Sedate, gd SR, tolerated procedure well, VSS, report to RN 

## 2021-02-17 ENCOUNTER — Ambulatory Visit (HOSPITAL_BASED_OUTPATIENT_CLINIC_OR_DEPARTMENT_OTHER): Payer: BC Managed Care – PPO | Admitting: Nurse Practitioner

## 2021-02-18 ENCOUNTER — Telehealth: Payer: Self-pay | Admitting: *Deleted

## 2021-02-18 NOTE — Telephone Encounter (Signed)
  Follow up Call-  Call back number 02/16/2021  Post procedure Call Back phone  # [phone]  Permission to leave phone message Yes  Some recent data might be hidden     Patient questions:  Message left to call if necessary.

## 2021-02-18 NOTE — Telephone Encounter (Signed)
  Follow up Call-  Call back number 02/16/2021  Post procedure Call Back phone  # [phone]  Permission to leave phone message Yes  Some recent data might be hidden   [facility]

## 2021-02-19 ENCOUNTER — Encounter: Payer: Self-pay | Admitting: Internal Medicine

## 2021-02-19 ENCOUNTER — Other Ambulatory Visit (HOSPITAL_COMMUNITY): Payer: Self-pay | Admitting: Cardiology

## 2021-02-23 ENCOUNTER — Ambulatory Visit (INDEPENDENT_AMBULATORY_CARE_PROVIDER_SITE_OTHER): Payer: BC Managed Care – PPO

## 2021-02-23 ENCOUNTER — Telehealth: Payer: Self-pay

## 2021-02-23 DIAGNOSIS — Z95810 Presence of automatic (implantable) cardiac defibrillator: Secondary | ICD-10-CM

## 2021-02-23 DIAGNOSIS — I480 Paroxysmal atrial fibrillation: Secondary | ICD-10-CM

## 2021-02-23 DIAGNOSIS — I5022 Chronic systolic (congestive) heart failure: Secondary | ICD-10-CM

## 2021-02-23 MED ORDER — APIXABAN 5 MG PO TABS: 5.0000 mg | ORAL_TABLET | Freq: Two times a day (BID) | ORAL | 2 refills | Status: DC

## 2021-02-23 NOTE — Telephone Encounter (Signed)
   Reviewed with Dr. [name] in office 02/23/21. Verbal orders obtained to STOP ASA 81 mg and START Eliquis 5 mg BID. Script sent to pharmacy. MAR updated. Schedule patient with Dr. Donaciano Eva 1-2 weeks. Scheduled patient 02/24/21 d/t patient request considering driving restrictions. Patient advised not to drive until reviewed and discussed with provider at in office apt. On 02/24/21. Location, date and time discussed with patient for upcoming apt.   Routing to Dr. [name] and Dr. [name] for review.

## 2021-02-23 NOTE — Telephone Encounter (Signed)
[facility] alert for ATP and HV therapy 20 NSVT, 1 SVT 10/20 @ 20:01 EGM shows AF with RVR, rate 250 falling in to VF zone, ATP delivered x1 followed by HV therapy 36J slowing rate to regular VS.  21:02 AF with RVR, rate 246, ATP delivered x1, slowing rhythm.  21:22 AF with RVR, ATP delivered x1 slowing rhythm. AF burden <1%, no Hx of AF or OAV noted in EPIC Presenting regular AS/BP in the 90's.  ASA, Coreg prescribed Battery estimated 83mo, next remote 10/28 Route to triage  Patient reports he was out of town, went out for dinner and was unaware of any symptoms. Patient reviewed shock plan and driving restrictions per [place] DMV X 6 months until reviewed with Dr. [name].   Advised patient I will call to follow up.

## 2021-02-23 NOTE — Progress Notes (Signed)
  Electrophysiology Office Note Date: 02/24/2021  ID:  Gary Williamson, DOB 03/28/1966, MRN [id number]  PCP: Early, Sara E, NP Primary Cardiologist: None Electrophysiologist: James [name], MD   CC: Routine ICD follow-up  Gary Williamson is a 55 y.o. male seen today for James [name], MD for acute visit due to new atrial fibrillation .  Since last being seen in our clinic the patient reports doing OK. He did not realize that he had been shocked. No recent illnesses or med non-compliance. He is most concerned about the recommendation to hold off on driving until he saw us.   Device History: St. Jude BiV ICD implanted 2015 for chronic systolic CHF  Past Medical History:  Diagnosis Date   Acute idiopathic pericarditis 03/24/2010   Overview:  Viral Idiopathic Pericarditis    CHF (congestive heart failure) (HCC)    Chronic systolic heart failure (HCC)    Colon cancer screening    Hematuria, microscopic 05/15/2013   05/15/2013 visit with Mark Ottelin, MD at [facility]. Normal CT scan. Recommended annual urine for microscopy and serum creatinine x 3 years. 04/20/2013 visit with Mark Ottelin, MD at [facility].    Hypertension    LBBB (left bundle branch block)    Chronic   Leg edema    NICM (nonischemic cardiomyopathy) (HCC)    Probably nonischemic; LHC in 2002/03 no sig CAD per report (done in [place]). Cardiomyopathy for 9-10 yrs. 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 Echo 5/11: severe LVE, EF <20%, diff HK, mod dias dys, sev. LAE. Hosp for CHF in 5/11. Ad. MV 6/11: EF 21%, mild per-infart ischemia. LHC (7/11): EF 15%, no CAD, LVEDP 22; Echo (10/11): EF 20-25%;  Echo (12/12):  EF 20%, mod LVH, Gr 1 DD, diff HK, mild LAE   OSA (obstructive sleep apnea)    cpap not  used   Prediabetes 08/18/2020   Pure hypercholesterolemia    Severe obesity (HCC)    Sleep apnea    Vitamin D deficiency    Past Surgical History:  Procedure Laterality Date    BI-VENTRICULAR IMPLANTABLE CARDIOVERTER DEFIBRILLATOR N/A 08/21/2013   Procedure: BI-VENTRICULAR IMPLANTABLE CARDIOVERTER DEFIBRILLATOR  (CRT-D);  Surgeon: James D [name], MD;  Location: [facility] CATH [facility];  Service: Cardiovascular;  Laterality: N/A;   CARDIAC CATHETERIZATION  2002 or 2003   COLONOSCOPY N/A 09/16/2014   Procedure: COLONOSCOPY;  Surgeon: John N Perry, MD;  Location: [facility] END[facility]COPY;  Service: Endoscopy;  Laterality: N/A;   COLONOSCOPY     LEFT HEART CATH  11/13/2009   Dr McLean   POLYPECTOMY      Current Outpatient Medications  Medication Sig Dispense Refill   apixaban (ELIQUIS) 5 MG TABS tablet Take 1 tablet (5 mg total) by mouth 2 (two) times daily. 60 tablet 2   blood glucose meter kit and supplies Dispense based on patient and insurance preference. Check blood sugar every morning before eating and up to three additional times a day as needed (FOR ICD-10 E10.9, E11.9). 1 each 0   carvedilol (COREG) 25 MG tablet Take 1 tablet (25 mg total) by mouth 2 (two) times daily with a meal. Absolute last refill without office visit please call [phone] to schedule 60 tablet 0   Cholecalciferol (VITAMIN D) 2000 units CAPS Take by mouth daily.     dapagliflozin propanediol (FARXIGA) 10 MG TABS tablet Take 1 tablet (10 mg total) by mouth daily. Needs appt 90 tablet 0   fish oil-omega-3 fatty acids 1000 MG capsule Take   1 capsule (1 g total) by mouth daily. 30 capsule 2   Lancets (ONETOUCH DELICA PLUS LANCET33G) MISC USE 1 LANCET TO CHECK GLUCOSE EVERY MORNING BEFORE EATING AND UP TO 3 ADDITIONAL TIMES PER DAY AS NEEDED 100 each 1   metFORMIN (GLUCOPHAGE) 500 MG tablet Take 2 tabs (1000mg) by mouth with breakfast and 2 tabs (1000mg) by mouth with dinner daily. Full dose of 2000mg per day. 120 tablet 5   Multiple Vitamin (MULTIVITAMIN) tablet Take 1 tablet by mouth daily.     ONETOUCH VERIO test strip USE 1 STRIP TO CHECK BLOOD SUGAR EVERY MORNING BEFORE EATING AND UP TO 3 ADDITIONAL TIMES PER DAY  AS NEEDED 100 each 0   potassium chloride SA (KLOR-CON) 20 MEQ tablet Take 1 tablet (20 mEq total) by mouth daily. Needs appt for further refills 30 tablet 0   rosuvastatin (CRESTOR) 20 MG tablet Take 1 tablet (20 mg total) by mouth daily. 30 tablet 3   sacubitril-valsartan (ENTRESTO) 97-103 MG Take 1 tablet by mouth 2 (two) times daily. Please call to schedule an appointment for future refills. 1st attempt 60 tablet 3   spironolactone (ALDACTONE) 25 MG tablet Take 1 tablet (25 mg total) by mouth daily. 90 tablet 1   torsemide (DEMADEX) 20 MG tablet Take 2 tablets (40 mg total) by mouth daily. 180 tablet 3   No current facility-administered medications for this visit.    Allergies:   Patient has no known allergies.   Social History: Social History   Socioeconomic History   Marital status: Married    Spouse name: Felicia   Number of children: 2   Years of education: Not on file   Highest education level: Not on file  Occupational History   Occupation: Director of human resources for [employer]    Employer: OTHER    Comment: Full-time    Employer: [employer]  Tobacco Use   Smoking status: Never   Smokeless tobacco: Never  Substance and Sexual Activity   Alcohol use: Yes    Alcohol/week: 0.0 - 2.0 standard drinks    Comment: rare to occ.   Drug use: No   Sexual activity: Yes    Birth control/protection: Condom  Other Topics Concern   Not on file  Social History Narrative   Marital status: married x 17 years; happily married      Children: 2 children (15,12)      Lives in [place] with his wife and 2 children      Employment: [employer]; HR Manager x 12 years; likes work.  Lots of traveling.       Tobacco: none      Alcohol:  1 drink per week      Exercise:  Walking three days per week; 20 minutes.       Seatbelt: 100%   Social Determinants of Health   Financial Resource Strain: Not on file  Food Insecurity: Not on file  Transportation Needs: Not on file  Physical Activity:  Not on file  Stress: Not on file  Social Connections: Not on file  Intimate Partner Violence: Not on file    Family History: Family History  Problem Relation Age of Onset   Hypertension Mother    Alzheimer's disease Mother    Hyperlipidemia Mother    Breast cancer Mother    Arthritis Father    Dementia Father    Hypertension Father    Alzheimer's disease Father    COPD Brother    Stroke Maternal Grandmother      Coronary artery disease Neg Hx    Heart failure Neg Hx    Heart disease Neg Hx    Colon cancer Neg Hx    Colon polyps Neg Hx    Esophageal cancer Neg Hx    Rectal cancer Neg Hx    Stomach cancer Neg Hx     Review of Systems: All other systems reviewed and are otherwise negative except as noted above.   Physical Exam: Vitals:   02/24/21 1118  BP: 118/74  Pulse: 86  SpO2: 95%  Weight: (!) 345 lb (156.5 kg)  Height: 6' 1" (1.854 m)     GEN- The patient is well appearing, alert and oriented x 3 today.   HEENT: normocephalic, atraumatic; sclera clear, conjunctiva pink; hearing intact; oropharynx clear; neck supple, no JVP Lymph- no cervical lymphadenopathy Lungs- Clear to ausculation bilaterally, normal work of breathing.  No wheezes, rales, rhonchi Heart- Regular rate and rhythm, no murmurs, rubs or gallops, PMI not laterally displaced GI- soft, non-tender, non-distended, bowel sounds present, no hepatosplenomegaly Extremities- no clubbing or cyanosis. No edema; DP/PT/radial pulses 2+ bilaterally MS- no significant deformity or atrophy Skin- warm and dry, no rash or lesion; ICD pocket well healed Psych- euthymic mood, full affect Neuro- strength and sensation are intact  ICD interrogation- reviewed in detail today,  See PACEART report  EKG:  EKG is not ordered today.  Recent Labs: 08/18/2020: ALT 21; BUN 13; Creatinine, Ser 1.19; Hemoglobin 14.4; Platelets 244; Potassium 3.5; Sodium 143   Wt Readings from Last 3 Encounters:  02/24/21 (!) 345 lb (156.5  kg)  02/16/21 (!) 343 lb (155.6 kg)  01/30/21 (!) 340 lb (154.2 kg)     Other studies Reviewed: Additional studies/ records that were reviewed today include: Previous EP office notes.   Assessment and Plan:  1.  Chronic systolic dysfunction s/p St. Jude CRT-D  euvolemic today Stable on an appropriate medical regimen Normal ICD function See Pace Art report No changes today  2. Paroxysmal atrial fibrillation New diagnosis Started on Eliquis 5 mg BID Shocked for AF RVR. Reviewed recent episodes along with industry. Appears to consistently have A>V, Atrially lead and doesn't appear to have had any true VT.  Will not recommend driving restrictions at this time, as he was also symptomatic.  Given the speed of his arrhythmias I did not change his settings at this time. If recurs, Could change VT zone (<200) to ATP only. If Atrial will likely be ineffective, if ventricular will either be successful or possibly speed up to VF zone for shock.  Discussed AAD including Tikosyn, Sotalol, and Amiodarone. Flecainide poor option given CHF. Prefers to hold off for now.  Will change coreg to more cardioselective BB with Toprol XL 100 mg daily.  Poor candidate for ablation consideration with Body mass index is 45.52 kg/m.   Current medicines are reviewed at length with the patient today.    Labs/ tests ordered today include:  Orders Placed This Encounter  Procedures   Comprehensive metabolic panel   Magnesium   TSH   CUP PACEART INCLINIC DEVICE CHECK   ECHOCARDIOGRAM COMPLETE    Disposition:   Follow up with EP APP in 3-4 week for further assessment and consideration for device adjustment if needed and follow up on new start OAC.     Signed, Michael Andrew Tillery, PA-C  02/24/2021 11:42 AM  [facility] [address] [place] [address] [phone] (office) [phone] (fax)  

## 2021-02-24 ENCOUNTER — Other Ambulatory Visit: Payer: Self-pay

## 2021-02-24 ENCOUNTER — Ambulatory Visit: Payer: BC Managed Care – PPO | Admitting: Student

## 2021-02-24 ENCOUNTER — Encounter (HOSPITAL_BASED_OUTPATIENT_CLINIC_OR_DEPARTMENT_OTHER): Payer: Self-pay | Admitting: Nurse Practitioner

## 2021-02-24 ENCOUNTER — Ambulatory Visit (HOSPITAL_BASED_OUTPATIENT_CLINIC_OR_DEPARTMENT_OTHER): Payer: BC Managed Care – PPO | Admitting: Nurse Practitioner

## 2021-02-24 ENCOUNTER — Encounter: Payer: Self-pay | Admitting: Student

## 2021-02-24 VITALS — BP 132/68 | HR 86 | Ht 73.0 in | Wt 345.0 lb

## 2021-02-24 VITALS — BP 118/74 | HR 86 | Ht 73.0 in | Wt 345.0 lb

## 2021-02-24 DIAGNOSIS — I428 Other cardiomyopathies: Secondary | ICD-10-CM | POA: Diagnosis not present

## 2021-02-24 DIAGNOSIS — I480 Paroxysmal atrial fibrillation: Secondary | ICD-10-CM

## 2021-02-24 DIAGNOSIS — G4733 Obstructive sleep apnea (adult) (pediatric): Secondary | ICD-10-CM | POA: Diagnosis not present

## 2021-02-24 DIAGNOSIS — I10 Essential (primary) hypertension: Secondary | ICD-10-CM | POA: Diagnosis not present

## 2021-02-24 DIAGNOSIS — I4891 Unspecified atrial fibrillation: Secondary | ICD-10-CM

## 2021-02-24 DIAGNOSIS — E1165 Type 2 diabetes mellitus with hyperglycemia: Secondary | ICD-10-CM

## 2021-02-24 DIAGNOSIS — E782 Mixed hyperlipidemia: Secondary | ICD-10-CM

## 2021-02-24 DIAGNOSIS — I5022 Chronic systolic (congestive) heart failure: Secondary | ICD-10-CM | POA: Diagnosis not present

## 2021-02-24 LAB — CUP PACEART INCLINIC DEVICE CHECK
Battery Remaining Longevity: 6 mo
Brady Statistic RA Percent Paced: 2.3 %
Brady Statistic RV Percent Paced: 97 %
Date Time Interrogation Session: 20221025142326
HighPow Impedance: 67.5 Ohm
Implantable Lead Implant Date: 20150421
Implantable Lead Implant Date: 20150421
Implantable Lead Implant Date: 20150421
Implantable Lead Location: 753857
Implantable Lead Location: 753859
Implantable Lead Location: 753860
Implantable Pulse Generator Implant Date: 20150421
Lead Channel Impedance Value: 400 Ohm
Lead Channel Impedance Value: 487.5 Ohm
Lead Channel Impedance Value: 775 Ohm
Lead Channel Pacing Threshold Amplitude: 0.625 V
Lead Channel Pacing Threshold Amplitude: 0.75 V
Lead Channel Pacing Threshold Amplitude: 0.75 V
Lead Channel Pacing Threshold Amplitude: 1 V
Lead Channel Pacing Threshold Amplitude: 1 V
Lead Channel Pacing Threshold Pulse Width: 0.5 ms
Lead Channel Pacing Threshold Pulse Width: 0.5 ms
Lead Channel Pacing Threshold Pulse Width: 0.5 ms
Lead Channel Pacing Threshold Pulse Width: 0.5 ms
Lead Channel Pacing Threshold Pulse Width: 0.5 ms
Lead Channel Sensing Intrinsic Amplitude: 11.9 mV
Lead Channel Sensing Intrinsic Amplitude: 5 mV
Lead Channel Setting Pacing Amplitude: 2 V
Lead Channel Setting Pacing Amplitude: 2 V
Lead Channel Setting Pacing Amplitude: 2 V
Lead Channel Setting Pacing Pulse Width: 0.5 ms
Lead Channel Setting Pacing Pulse Width: 0.5 ms
Lead Channel Setting Sensing Sensitivity: 0.5 mV
Pulse Gen Serial Number: 7157119

## 2021-02-24 LAB — COMPREHENSIVE METABOLIC PANEL
ALT: 20 IU/L (ref 0–44)
AST: 18 IU/L (ref 0–40)
Albumin/Globulin Ratio: 1.6 (ref 1.2–2.2)
Albumin: 4.3 g/dL (ref 3.8–4.9)
Alkaline Phosphatase: 89 IU/L (ref 44–121)
BUN/Creatinine Ratio: 12 (ref 9–20)
BUN: 16 mg/dL (ref 6–24)
Bilirubin Total: 0.3 mg/dL (ref 0.0–1.2)
CO2: 23 mmol/L (ref 20–29)
Calcium: 9.6 mg/dL (ref 8.7–10.2)
Chloride: 103 mmol/L (ref 96–106)
Creatinine, Ser: 1.36 mg/dL — ABNORMAL HIGH (ref 0.76–1.27)
Globulin, Total: 2.7 g/dL (ref 1.5–4.5)
Glucose: 119 mg/dL — ABNORMAL HIGH (ref 70–99)
Potassium: 4.1 mmol/L (ref 3.5–5.2)
Sodium: 142 mmol/L (ref 134–144)
Total Protein: 7 g/dL (ref 6.0–8.5)
eGFR: 61 mL/min/{1.73_m2} (ref >59)

## 2021-02-24 LAB — MAGNESIUM: Magnesium: 2.1 mg/dL (ref 1.6–2.3)

## 2021-02-24 LAB — TSH: TSH: 3 u[IU]/mL (ref 0.450–4.500)

## 2021-02-24 MED ORDER — METOPROLOL SUCCINATE ER 100 MG PO TB24: 100.0000 mg | ORAL_TABLET | Freq: Every day | ORAL | 3 refills | Status: DC

## 2021-02-24 NOTE — Assessment & Plan Note (Signed)
Recent diagnosis from cardiology ASA changed to Eliquis- samples provided today for patient.  No arrhythmia noted today.  No symptoms.  Hgb reportedly low with POC A1c machine- will send CBC for further evaluation today. No signs of bleeding

## 2021-02-24 NOTE — Patient Instructions (Signed)
Medication Instructions:  Your physician has recommended you make the following change in your medication:   DISCONTINUE: Carvedilol START: Metoprolol Succinate 100mg  daily  *If you need a refill on your cardiac medications before your next appointment, please call your pharmacy*   Lab Work: TODAY: CMET, Mag, TSH  If you have labs (blood work) drawn today and your tests are completely normal, you will receive your results only by: [place] (if you have MyChart) OR A paper copy in the mail If you have any lab test that is abnormal or we need to change your treatment, we will call you to review the results.   Testing/Procedures: Your physician has requested that you have an echocardiogram. Echocardiography is a painless test that uses sound waves to create images of your heart. It provides your doctor with information about the size and shape of your heart and how well your heart's chambers and valves are working. This procedure takes approximately one hour. There are no restrictions for this procedure.   Follow-Up: At [facility], you and your health needs are our priority.  As part of our continuing mission to provide you with exceptional heart care, we have created designated Provider Care Teams.  These Care Teams include your primary Cardiologist (physician) and Advanced Practice Providers (APPs -  Physician Assistants and Nurse Practitioners) who all work together to provide you with the care you need, when you need it.  We recommend signing up for the patient portal called "MyChart".  Sign up information is provided on this After Visit Summary.  MyChart is used to connect with patients for Virtual Visits (Telemedicine).  Patients are able to view lab/test results, encounter notes, upcoming appointments, etc.  Non-urgent messages can be sent to your provider as well.   To learn more about what you can do with MyChart, go to [email or web address].    Your next appointment:    As scheduled

## 2021-02-24 NOTE — Assessment & Plan Note (Signed)
Taking medication as prescribed.  Lipids checked with cards recently.  No changes

## 2021-02-24 NOTE — Patient Instructions (Signed)
Happy Birthday!!!!!!!   Let me know if you have any concerns between now and our next visit.

## 2021-02-24 NOTE — Assessment & Plan Note (Signed)
A1c unable to run due to low hemoglobin report on POC machine- will send to lab with CBC order.  Appears well controlled based on his current data from glucometer.  Lipids and CMET with cardiology recently.

## 2021-02-24 NOTE — Progress Notes (Signed)
Established Patient Office Visit  Subjective:  Patient ID: Gary Williamson, male    DOB: 09-01-1965  Age: 55 y.o. MRN: [id number]  CC:  Chief Complaint  Patient presents with   Follow-up    DM, hyperlipidemia, and HTN. Patient has no concerns or complaints. He had labs completed at Cardiology this morning.     HPI [facility] presents for follow-up for DM, HTN, HLD.   His at home blood sugar readings have been in the 110's consistently.  He has not had any hypoglycemic events.  He reports he has been feeling well No polydipsia, polyuria, polyphagia  Last week he reports his implanted defibrillator shocked him.  He was conscious the entire time.  He did see cardiology yesterday morning and was told that he went into Afib..  They changed his Aspirin to Eliquis 85m BID and changed the carvedilol to metoprolol.  He reports he feels good and has not had any additional episodes.   He is monitoring his diet and working on weight loss.   Past Medical History:  Diagnosis Date   Acute idiopathic pericarditis 03/24/2010   Overview:  Viral Idiopathic Pericarditis    CHF (congestive heart failure) (HCC)    [place] heart [place]    Colon cancer screening    Hematuria, microscopic 05/15/2013   [facility] Normal [facility] scan. Recommended annual urine for microscopy and serum creatinine x 3 years. [facility]    Hypertension    LBBB (left bundle branch block)    Chronic   Leg edema    NIC[place] (nonischemic cardiomyopathy) ([place]    Probably nonischemic; LHC in 2002/03 [place] sig [place]. Cardiomyopathy for 9-10 yrs. 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 Echo 5/11: severe LVE, EF <20%, diff HK, mod dias dys, sev. LAE. Hosp for CHF in 5/11. Ad. MV 6/11: EF 21%, mild per-infart ischemia. LHC (7/11): EF 15%, no CAD, LVEDP 22; Echo (10/11): EF  20-25%;  Echo (12/12):  EF 20%, mod LVH, Gr 1 DD, diff HK, mild LAE   OSA (obstructive sleep apnea)    cpap not  used   Prediabetes 08/18/2020   Pure hypercholesterolemia    Severe obesity ([place]    Sleep apnea    Vitamin D deficiency     Past Surgical History:  Procedure Laterality Date   BI-VENTRICULAR IMPLANTABLE CARDIOVERTER DEFIBRILLATOR N/A 08/21/2013   Procedure: BI-VENTRICULAR IMPLANTABLE CARDIOVERTER DEFIBRILLATOR  (CRT-D);  Surgeon: JCoralyn Mark MD;  [facility];  Service: Cardiovascular;  Laterality: N/A;   CARDIAC CATHETERIZATION  2002 or 2003   COLONOSCOPY N/A 09/16/2014   Procedure: COLONOSCOPY;  Surgeon: JIrene Shipper MD;  [facility];  Service: Endoscopy;  Laterality: N/A;   COLONOSCOPY     LEFT HEART CATH  11/13/2009   Dr MAundra Dubin  POLYPECTOMY      Family History  Problem Relation Age of Onset   Hypertension Mother    Alzheimer's disease Mother    Hyperlipidemia Mother    Breast cancer Mother    Arthritis Father    Dementia Father    Hypertension Father    Alzheimer's disease Father    COPD Brother    Stroke Maternal Grandmother    Coronary artery disease Neg Hx    Heart failure Neg Hx    Heart disease Neg Hx    Colon cancer Neg Hx  Colon polyps Neg Hx    Esophageal cancer Neg Hx    Rectal cancer Neg Hx    Stomach cancer Neg Hx     Social History   Socioeconomic History   Marital status: Married    Spouse name: Felicia   Number of children: 2   Years of education: Not on file   Highest education level: Not on file  Occupational History   Occupation: Mudlogger of Programmer, 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   Drug use: No   Sexual activity: Yes    Birth control/protection: Condom  Other  Topics Concern   Not on file  Social History Narrative   Marital status: married x 17 years; happily married      Children: 2 children (15,12)      Lives in [place] with his wife and 2 children      Employment: [employer]; Solicitor x 12 years; likes work.  Lots of traveling.       Tobacco: none      Alcohol:  1 drink per week      Exercise:  Walking three days per week; 20 minutes.       Seatbelt: 100%   Social Determinants of Radio broadcast assistant Strain: Not on file  Food Insecurity: Not on file  Transportation Needs: Not on file  Physical Activity: Not on file  Stress: Not on file  Social Connections: Not on file  Intimate Partner Violence: Not on file    Outpatient Medications Prior to Visit  Medication Sig Dispense Refill   apixaban (ELIQUIS) 5 MG TABS tablet Take 1 tablet (5 mg total) by mouth 2 (two) times daily. 60 tablet 2   blood glucose meter kit and supplies Dispense based on patient and insurance preference. Check blood sugar every morning before eating and up to three additional times a day as needed (FOR ICD-10 E10.9, E11.9). 1 each 0   Cholecalciferol (VITAMIN D) 2000 units CAPS Take by mouth daily.     dapagliflozin propanediol (FARXIGA) 10 MG TABS tablet Take 1 tablet (10 mg total) by mouth daily. Needs appt 90 tablet 0   fish oil-omega-3 fatty acids 1000 MG capsule Take 1 capsule (1 g total) by mouth daily. 30 capsule 2   Lancets (ONETOUCH DELICA PLUS [id number]) MISC USE 1 LANCET TO CHECK GLUCOSE EVERY MORNING BEFORE EATING AND UP TO 3 ADDITIONAL TIMES PER DAY AS NEEDED 100 each 1   metFORMIN (GLUCOPHAGE) 500 MG tablet Take 2 tabs (1045m) by mouth with breakfast and 2 tabs (10046m by mouth with dinner daily. Full dose of 200078mer day. 120 tablet 5   metoprolol succinate (TOPROL-XL) 100 MG 24 hr tablet Take 1 tablet (100 mg total) by mouth daily. Take with or immediately following a meal. 90 tablet 3   Multiple Vitamin (MULTIVITAMIN) tablet Take 1 tablet  by mouth daily.     ONETOUCH VERIO test strip USE 1 STRIP TO CHECK BLOOD SUGAR EVERY MORNING BEFORE EATING AND UP TO 3 ADDITIONAL TIMES PER DAY AS NEEDED 100 each 0   potassium chloride SA (KLOR-CON) 20 MEQ tablet Take 1 tablet (20 mEq total) by mouth daily. Needs appt for further refills  30 tablet 0   rosuvastatin (CRESTOR) 20 MG tablet Take 1 tablet (20 mg total) by mouth daily. 30 tablet 3   sacubitril-valsartan (ENTRESTO) 97-103 MG Take 1 tablet by mouth 2 (two) times daily. Please call to schedule an appointment for future refills. 1st attempt 60 tablet 3   spironolactone (ALDACTONE) 25 MG tablet Take 1 tablet (25 mg total) by mouth daily. 90 tablet 1   torsemide (DEMADEX) 20 MG tablet Take 2 tablets (40 mg total) by mouth daily. 180 tablet 3   No facility-administered medications prior to visit.    No Known Allergies  ROS Review of Systems    Objective:    Physical Exam  BP 132/68   Pulse 86   Ht _0  (1.854 m)   Wt (!) 345 lb (156.5 kg)   SpO2 96%   BMI 45.52 kg/m  Wt Readings from Last 3 Encounters:  02/24/21 (!) 345 lb (156.5 kg)  02/24/21 (!) 345 lb (156.5 kg)  02/16/21 (!) 343 lb (155.6 kg)     Health Maintenance Due  Topic Date Due   OPHTHALMOLOGY EXAM  Never done   Zoster Vaccines- Shingrix (1 of 2) Never done   Pneumococcal Vaccine 73-23 Years old (2 - PCV) 04/03/2014   COVID-19 Vaccine (4 - Booster for Pfizer series) 04/12/2020   INFLUENZA VACCINE  12/01/2020    There are no preventive care reminders to display for this patient.  Lab Results  Component Value Date   TSH 3.000 02/24/2021   Lab Results  Component Value Date   WBC 11.4 (H) 08/18/2020   HGB 14.4 08/18/2020   HCT 43.7 08/18/2020   MCV 87.9 08/18/2020   PLT 244 08/18/2020   Lab Results  Component Value Date   NA 142 02/24/2021   K 4.1 02/24/2021   CO2 23 02/24/2021   GLUCOSE 119 (H) 02/24/2021   BUN 16 02/24/2021   CREATININE 1.36 (H) 02/24/2021   BILITOT 0.3 02/24/2021    ALKPHOS 89 02/24/2021   AST 18 02/24/2021   ALT 20 02/24/2021   PROT 7.0 02/24/2021   ALBUMIN 4.3 02/24/2021   CALCIUM 9.6 02/24/2021   ANIONGAP 10 08/18/2020   EGFR 61 02/24/2021   GFR 78.03 10/01/2013   Lab Results  Component Value Date   CHOL 188 08/18/2020   Lab Results  Component Value Date   HDL 33 (L) 08/18/2020   Lab Results  Component Value Date   LDLCALC 122 (H) 08/18/2020   Lab Results  Component Value Date   TRIG 165 (H) 08/18/2020   Lab Results  Component Value Date   CHOLHDL 5.7 08/18/2020   Lab Results  Component Value Date   HGBA1C 5.0 11/27/2020      Assessment & Plan:   Problem List Items Addressed This Visit     Mixed hypercholesterolemia and hypertriglyceridemia    Taking medication as prescribed.  Lipids checked with cards recently.  No changes      Essential hypertension    Well controlled today No changes      Type 2 diabetes mellitus with hyperglycemia, without long-term current use of insulin (HCC) - Primary    A1c unable to run due to low hemoglobin report on POC machine- will send to lab with CBC order.  Appears well controlled based on his current data from glucometer.  Lipids and CMET with cardiology recently.       Relevant Orders   CBC with Differential   Hemoglobin A1c   Atrial fibrillation (  [place])    Recent diagnosis from cardiology ASA changed to Eliquis- samples provided today for patient.  No arrhythmia noted today.  No symptoms.  Hgb reportedly low with POC A1c machine- will send CBC for further evaluation today. No signs of bleeding        No orders of the defined types were placed in this encounter.   Follow-up: Return in about 6 months (around 08/25/2021) for DM, HLD, HTN.    [name], NP

## 2021-02-24 NOTE — Assessment & Plan Note (Signed)
Well controlled today No changes

## 2021-02-25 LAB — HEMOGLOBIN A1C
Est. average glucose Bld gHb Est-mCnc: 126 mg/dL
Hgb A1c MFr Bld: 6 % — ABNORMAL HIGH (ref 4.8–5.6)

## 2021-02-25 LAB — CBC WITH DIFFERENTIAL/PLATELET
Basophils Absolute: 0 10*3/uL (ref 0.0–0.2)
Basos: 0 %
EOS (ABSOLUTE): 0.3 10*3/uL (ref 0.0–0.4)
Eos: 3 %
Hematocrit: 43.7 % (ref 37.5–51.0)
Hemoglobin: 14.7 g/dL (ref 13.0–17.7)
Immature Grans (Abs): 0 10*3/uL (ref 0.0–0.1)
Immature Granulocytes: 0 %
Lymphocytes Absolute: 2.3 10*3/uL (ref 0.7–3.1)
Lymphs: 21 %
MCH: 29.3 pg (ref 26.6–33.0)
MCHC: 33.6 g/dL (ref 31.5–35.7)
MCV: 87 fL (ref 79–97)
Monocytes Absolute: 0.8 10*3/uL (ref 0.1–0.9)
Monocytes: 7 %
Neutrophils Absolute: 7.4 10*3/uL — ABNORMAL HIGH (ref 1.4–7.0)
Neutrophils: 69 %
Platelets: 254 10*3/uL (ref 150–450)
RBC: 5.02 x10E6/uL (ref 4.14–5.80)
RDW: 14 % (ref 11.6–15.4)
WBC: 10.8 10*3/uL (ref 3.4–10.8)

## 2021-02-25 NOTE — Progress Notes (Signed)
EPIC Encounter for ICM Monitoring  Patient Name: Gary Williamson is a 55 y.o. male Date: 02/25/2021 Primary Care Physican: [name], NP Primary Cardiologist: [name] Electrophysiologist: [name] Bi-V Pacing:  98%     12/25/2020 Office Weight: 343 lbs   AT/AF Burden: <1% (taking Eliquis) Battery Longevity: 6 months      Transmission reviewed.  Pt seen in office by Oda Kilts, PA on 10/25 for follow up on recent ATP delivered.   Corvue Thoracic impedance suggesting fluid levels returned to normal.   Impedance pattern suggesting alternating fluid levels with days of possible fluid accumulation days followed by possible days of dryness.     Prescribed: Torsemide 20 mg take 2 tablets (40 mg total) daily. Potassium 20 mEq take 1 tablet daily Spironolactone 25 mg take 1 tablet daily   Labs: 08/18/2020 Creatinine 1.19, BUN 13, Potassium 3.5, Sodium 143, GFR >60 A complete set of results can be found in Results Review.   Recommendations: Recommendations given at 02/24/2021 OV with Oda Kilts, PA   Follow-up plan: ICM clinic phone appointment on 04/06/2021.   91 day device clinic remote transmission 04/29/2021.     EP/Cardiology Office Visits:  03/24/2021 with Oda Kilts, [place]. 05/07/2020 with Dr. [name].     Copy of ICM check sent to Dr. [name].    3 month ICM trend: 02/22/2021.    1 Year ICM trend:       [name], RN 02/25/2021 9:57 AM

## 2021-03-19 ENCOUNTER — Ambulatory Visit (HOSPITAL_COMMUNITY): Payer: BC Managed Care – PPO | Attending: Cardiology

## 2021-03-19 ENCOUNTER — Other Ambulatory Visit: Payer: Self-pay

## 2021-03-19 DIAGNOSIS — I5022 Chronic systolic (congestive) heart failure: Secondary | ICD-10-CM | POA: Diagnosis present

## 2021-03-19 LAB — ECHOCARDIOGRAM COMPLETE
Area-P 1/2: 4.15 cm2
S' Lateral: 4.7 cm

## 2021-03-19 MED ORDER — PERFLUTREN LIPID MICROSPHERE
1.0000 mL | INTRAVENOUS | Status: AC | PRN
Start: 2021-03-19 — End: 2021-03-19
  Administered 2021-03-19: 3 mL via INTRAVENOUS

## 2021-03-23 NOTE — Progress Notes (Signed)
Electrophysiology Office Note Date: 03/24/2021  ID:  Gary Williamson, DOB Dec 08, 1965, MRN [id number]  PCP: [name], NP Primary Cardiologist: None Electrophysiologist: [name], MD   CC: Routine ICD follow-up  Gary Williamson is a 55 y.o. male seen today for [name], MD for routine electrophysiology followup.  Since last being seen in our clinic the patient reports doing well. No new complaints.  he denies chest pain, palpitations, dyspnea, PND, orthopnea, nausea, vomiting, dizziness, syncope, edema, weight gain, or early satiety. He has not had ICD shocks.   Device History: St. Jude BiV ICD implanted 2015 for chronic systolic CHF  Past Medical History:  Diagnosis Date   Acute idiopathic pericarditis 03/24/2010   Overview:  Viral Idiopathic Pericarditis    CHF (congestive heart failure) (HCC)    Chronic systolic heart failure ([place])    Colon cancer screening    Hematuria, microscopic 05/15/2013   05/15/2013 visit with [name], MD at [facility]. Normal CT scan. Recommended annual urine for microscopy and serum creatinine x 3 years. 04/20/2013 visit with [name], MD at [facility].    Hypertension    LBBB (left bundle branch block)    Chronic   Leg edema    NICM (nonischemic cardiomyopathy) ([place])    Probably nonischemic; LHC in 2002/03 no sig CAD per report (done in [place]). Cardiomyopathy for 9-10 yrs. 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 Echo 5/11: severe LVE, EF <20%, diff HK, mod dias dys, sev. LAE. Hosp for CHF in 5/11. Ad. MV 6/11: EF 21%, mild per-infart ischemia. LHC (7/11): EF 15%, no CAD, LVEDP 22; Echo (10/11): EF 20-25%;  Echo (12/12):  EF 20%, mod LVH, Gr 1 DD, diff HK, mild LAE   OSA (obstructive sleep apnea)    cpap not  used   Prediabetes 08/18/2020   Pure hypercholesterolemia    Severe obesity ([place])    Sleep apnea    Vitamin D deficiency    Past Surgical History:  Procedure Laterality Date    BI-VENTRICULAR IMPLANTABLE CARDIOVERTER DEFIBRILLATOR N/A 08/21/2013   Procedure: BI-VENTRICULAR IMPLANTABLE CARDIOVERTER DEFIBRILLATOR  (CRT-D);  Surgeon: Coralyn Mark, MD;  Location: [facility] CATH LAB;  Service: Cardiovascular;  Laterality: N/A;   CARDIAC CATHETERIZATION  2002 or 2003   COLONOSCOPY N/A 09/16/2014   Procedure: COLONOSCOPY;  Surgeon: [name], MD;  [facility];  Service: Endoscopy;  Laterality: N/A;   COLONOSCOPY     LEFT HEART CATH  11/13/2009   Dr [name]   POLYPECTOMY      Current Outpatient Medications  Medication Sig Dispense Refill   apixaban (ELIQUIS) 5 MG TABS tablet Take 1 tablet (5 mg total) by mouth 2 (two) times daily. 60 tablet 2   blood glucose meter kit and supplies Dispense based on patient and insurance preference. Check blood sugar every morning before eating and up to three additional times a day as needed (FOR ICD-10 E10.9, E11.9). 1 each 0   Cholecalciferol (VITAMIN D) 2000 units CAPS Take by mouth daily.     dapagliflozin propanediol (FARXIGA) 10 MG TABS tablet Take 1 tablet (10 mg total) by mouth daily. Needs appt 90 tablet 0   fish oil-omega-3 fatty acids 1000 MG capsule Take 1 capsule (1 g total) by mouth daily. 30 capsule 2   Lancets (ONETOUCH DELICA PLUS [id number]) MISC USE 1 LANCET TO CHECK GLUCOSE EVERY MORNING BEFORE EATING AND UP TO 3 ADDITIONAL TIMES PER DAY AS NEEDED 100 each 1  metFORMIN (GLUCOPHAGE) 500 MG tablet Take 2 tabs (1044m) by mouth with breakfast and 2 tabs (10075m by mouth with dinner daily. Full dose of 200073mer day. 120 tablet 5   metoprolol succinate (TOPROL-XL) 100 MG 24 hr tablet Take 1 tablet (100 mg total) by mouth daily. Take with or immediately following a meal. 90 tablet 3   Multiple Vitamin (MULTIVITAMIN) tablet Take 1 tablet by mouth daily.     ONETOUCH VERIO test strip USE 1 STRIP TO CHECK BLOOD SUGAR EVERY MORNING BEFORE EATING AND UP TO 3 ADDITIONAL TIMES PER DAY AS NEEDED 100 each 0   potassium  chloride SA (KLOR-CON) 20 MEQ tablet Take 1 tablet (20 mEq total) by mouth daily. Needs appt for further refills 30 tablet 0   rosuvastatin (CRESTOR) 20 MG tablet Take 1 tablet (20 mg total) by mouth daily. 30 tablet 3   sacubitril-valsartan (ENTRESTO) 97-103 MG Take 1 tablet by mouth 2 (two) times daily. Please call to schedule an appointment for future refills. 1st attempt 60 tablet 3   spironolactone (ALDACTONE) 25 MG tablet Take 1 tablet (25 mg total) by mouth daily. 90 tablet 1   torsemide (DEMADEX) 20 MG tablet Take 2 tablets (40 mg total) by mouth daily. 180 tablet 3   No current facility-administered medications for this visit.    Allergies:   Patient has no known allergies.   Social History: Social History   Socioeconomic History   Marital status: Married    Spouse name: Felicia   Number of children: 2   Years of education: Not on file   Highest education level: Not on file  Occupational History   Occupation: DirSpecial educational needs teachersources for [employer]    Comment: Full-time    Employer: [employer]   Smoking status: Never   Smokeless tobacco: Never  Substance and Sexual Activity   Alcohol use: Yes    Alcohol/week: 0.0 - 2.0 standard drinks    Comment: rare to occ.   Drug use: No   Sexual activity: Yes    Birth control/protection: Condom  Other Topics Concern   Not on file  Social History Narrative   Marital status: married x 17 years; happily married      Children: 2 children (15,12)      [place]      Employment: [employer]; HR Solicitor12 years; likes work.  Lots of traveling.       Tobacco: none      Alcohol:  1 drink per week      Exercise:  Walking three days per week; 20 minutes.       Seatbelt: 100%   Social Determinants of HeaRadio broadcast assistantrain: Not on file  Food Insecurity: Not on file  Transportation Needs: Not on file  Physical Activity: Not on file  Stress: Not on file   Social Connections: Not on file  Intimate Partner Violence: Not on file    Family History: Family History  Problem Relation Age of Onset   Hypertension Mother    Alzheimer's disease Mother    Hyperlipidemia Mother    Breast cancer Mother    Arthritis Father    Dementia Father    Hypertension Father    Alzheimer's disease Father    COPD Brother    Stroke Maternal Grandmother    Coronary artery disease Neg Hx    Heart failure Neg Hx  Heart disease Neg Hx    Colon cancer Neg Hx    Colon polyps Neg Hx    Esophageal cancer Neg Hx    Rectal cancer Neg Hx    Stomach cancer Neg Hx     Review of Systems: All other systems reviewed and are otherwise negative except as noted above.   Physical Exam: Vitals:   03/24/21 0911  BP: 112/70  Pulse: 88  SpO2: 96%  Weight: (!) 347 lb (157.4 kg)  Height: '6\' 1"'  (1.854 m)     GEN- The patient is well appearing, alert and oriented x 3 today.   HEENT: normocephalic, atraumatic; sclera clear, conjunctiva pink; hearing intact; oropharynx clear; neck supple, no JVP Lymph- no cervical lymphadenopathy Lungs- Clear to ausculation bilaterally, normal work of breathing.  No wheezes, rales, rhonchi Heart- Regular rate and rhythm, no murmurs, rubs or gallops, PMI not laterally displaced GI- soft, non-tender, non-distended, bowel sounds present, no hepatosplenomegaly Extremities- no clubbing or cyanosis. No edema; DP/PT/radial pulses 2+ bilaterally MS- no significant deformity or atrophy Skin- warm and dry, no rash or lesion; ICD pocket well healed Psych- euthymic mood, full affect Neuro- strength and sensation are intact  ICD interrogation- reviewed in detail today,  See PACEART report  EKG:  EKG is not ordered today.   Recent Labs: 02/24/2021: ALT 20; BUN 16; Creatinine, Ser 1.36; Hemoglobin 14.7; Magnesium 2.1; Platelets 254; Potassium 4.1; Sodium 142; TSH 3.000   Wt Readings from Last 3 Encounters:  03/24/21 (!) 347 lb (157.4 kg)   02/24/21 (!) 345 lb (156.5 kg)  02/24/21 (!) 345 lb (156.5 kg)     Other studies Reviewed: Additional studies/ records that were reviewed today include: Previous EP office notes.   Assessment and Plan:  1.  Chronic systolic dysfunction s/p St. Jude CRT-D  Echo 03/2021 LVEF 35-40% euvolemic today Stable on an appropriate medical regimen of BB, Entresto, Spiro, and Jardiance Normal ICD function See Claudia Desanctis Art report No changes today  2. Paroxysmal atrial fibrillation New diagnosis Continue Eliquis 5 mg BID Shocked for AF RVR. Reviewed recent episodes along with industry. Appears to consistently have A>V, Atrially lead and doesn't appear to have had any true VT.  Will not recommend driving restrictions at this time, as he was also symptomatic.  Given the speed of his arrhythmias I did not change his settings at this time. If recurs, Could change VT zone (<200) to ATP only. If Atrial will likely be ineffective, if ventricular will either be successful or possibly speed up to VF zone for shock.  Discussed AAD including Tikosyn, Sotalol, and Amiodarone. Flecainide poor option given CHF. Prefers to hold off for now.  Continue toprol 100 mg daily.  Poor candidate for ablation consideration with Body mass index is 45.78 kg/m.   Current medicines are reviewed at length with the patient today.    Labs/ tests ordered today include:  Orders Placed This Encounter  Procedures   Basic metabolic panel   CBC    Disposition:   Follow up with EP APP in 3 months    Signed, Shirley Friar, PA-C  03/24/2021 9:18 AM  [facility] [address] [place]  [address] [phone] (office) [phone] (fax)

## 2021-03-24 ENCOUNTER — Other Ambulatory Visit: Payer: Self-pay

## 2021-03-24 ENCOUNTER — Ambulatory Visit (INDEPENDENT_AMBULATORY_CARE_PROVIDER_SITE_OTHER): Payer: BC Managed Care – PPO | Admitting: Student

## 2021-03-24 ENCOUNTER — Other Ambulatory Visit (HOSPITAL_BASED_OUTPATIENT_CLINIC_OR_DEPARTMENT_OTHER): Payer: Self-pay | Admitting: Nurse Practitioner

## 2021-03-24 ENCOUNTER — Encounter: Payer: Self-pay | Admitting: Student

## 2021-03-24 VITALS — BP 112/70 | HR 88 | Ht 73.0 in | Wt 347.0 lb

## 2021-03-24 DIAGNOSIS — I5022 Chronic systolic (congestive) heart failure: Secondary | ICD-10-CM

## 2021-03-24 DIAGNOSIS — E876 Hypokalemia: Secondary | ICD-10-CM

## 2021-03-24 DIAGNOSIS — I480 Paroxysmal atrial fibrillation: Secondary | ICD-10-CM

## 2021-03-24 DIAGNOSIS — I428 Other cardiomyopathies: Secondary | ICD-10-CM | POA: Diagnosis not present

## 2021-03-24 LAB — CUP PACEART INCLINIC DEVICE CHECK
Battery Remaining Longevity: 5 mo
Brady Statistic RA Percent Paced: 0.7 %
Brady Statistic RV Percent Paced: 95 %
Date Time Interrogation Session: 20221122092828
HighPow Impedance: 68.625
Implantable Lead Implant Date: 20150421
Implantable Lead Implant Date: 20150421
Implantable Lead Implant Date: 20150421
Implantable Lead Location: 753857
Implantable Lead Location: 753859
Implantable Lead Location: 753860
Implantable Pulse Generator Implant Date: 20150421
Lead Channel Impedance Value: 362.5 Ohm
Lead Channel Impedance Value: 475 Ohm
Lead Channel Impedance Value: 787.5 Ohm
Lead Channel Pacing Threshold Amplitude: 0.625 V
Lead Channel Pacing Threshold Amplitude: 0.75 V
Lead Channel Pacing Threshold Amplitude: 0.75 V
Lead Channel Pacing Threshold Amplitude: 1 V
Lead Channel Pacing Threshold Amplitude: 1 V
Lead Channel Pacing Threshold Pulse Width: 0.5 ms
Lead Channel Pacing Threshold Pulse Width: 0.5 ms
Lead Channel Pacing Threshold Pulse Width: 0.5 ms
Lead Channel Pacing Threshold Pulse Width: 0.5 ms
Lead Channel Pacing Threshold Pulse Width: 0.5 ms
Lead Channel Sensing Intrinsic Amplitude: 11.9 mV
Lead Channel Sensing Intrinsic Amplitude: 4.4 mV
Lead Channel Setting Pacing Amplitude: 2 V
Lead Channel Setting Pacing Amplitude: 2 V
Lead Channel Setting Pacing Amplitude: 2 V
Lead Channel Setting Pacing Pulse Width: 0.5 ms
Lead Channel Setting Pacing Pulse Width: 0.5 ms
Lead Channel Setting Sensing Sensitivity: 0.5 mV
Pulse Gen Serial Number: 7157119

## 2021-03-24 LAB — BASIC METABOLIC PANEL
BUN/Creatinine Ratio: 10 (ref 9–20)
BUN: 12 mg/dL (ref 6–24)
CO2: 26 mmol/L (ref 20–29)
Calcium: 8.8 mg/dL (ref 8.7–10.2)
Chloride: 102 mmol/L (ref 96–106)
Creatinine, Ser: 1.15 mg/dL (ref 0.76–1.27)
Glucose: 114 mg/dL — ABNORMAL HIGH (ref 70–99)
Potassium: 3.7 mmol/L (ref 3.5–5.2)
Sodium: 144 mmol/L (ref 134–144)
eGFR: 74 mL/min/{1.73_m2} (ref >59)

## 2021-03-24 LAB — CBC
Hematocrit: 42.2 % (ref 37.5–51.0)
Hemoglobin: 14.3 g/dL (ref 13.0–17.7)
MCH: 29 pg (ref 26.6–33.0)
MCHC: 33.9 g/dL (ref 31.5–35.7)
MCV: 86 fL (ref 79–97)
Platelets: 220 10*3/uL (ref 150–450)
RBC: 4.93 x10E6/uL (ref 4.14–5.80)
RDW: 12.8 % (ref 11.6–15.4)
WBC: 10.7 10*3/uL (ref 3.4–10.8)

## 2021-03-24 NOTE — Patient Instructions (Signed)
Medication Instructions:  °Your physician recommends that you continue on your current medications as directed. Please refer to the Current Medication list given to you today. ° °*If you need a refill on your cardiac medications before your next appointment, please call your pharmacy* ° ° °Lab Work: °TODAY: BMET, CBC ° °If you have labs (blood work) drawn today and your tests are completely normal, you will receive your results only by: °MyChart Message (if you have MyChart) OR °A paper copy in the mail °If you have any lab test that is abnormal or we need to change your treatment, we will call you to review the results. ° ° °Follow-Up: °At [facility], you and your health needs are our priority.  As part of our continuing mission to provide you with exceptional heart care, we have created designated Provider Care Teams.  These Care Teams include your primary Cardiologist (physician) and Advanced Practice Providers (APPs -  Physician Assistants and Nurse Practitioners) who all work together to provide you with the care you need, when you need it. ° °Your next appointment:   °As scheduled °

## 2021-03-29 LAB — CUP PACEART REMOTE DEVICE CHECK
Battery Remaining Longevity: 5 mo
Battery Remaining Percentage: 6 %
Battery Voltage: 2.63 V
Brady Statistic AP VP Percent: 1 %
Brady Statistic AP VS Percent: 0 %
Brady Statistic AS VP Percent: 95 %
Brady Statistic AS VS Percent: 4.5 %
Brady Statistic RA Percent Paced: 1 %
Date Time Interrogation Session: 20221125080024
HighPow Impedance: 70 Ohm
HighPow Impedance: 70 Ohm
Implantable Lead Implant Date: 20150421
Implantable Lead Implant Date: 20150421
Implantable Lead Implant Date: 20150421
Implantable Lead Location: 753857
Implantable Lead Location: 753859
Implantable Lead Location: 753860
Implantable Pulse Generator Implant Date: 20150421
Lead Channel Impedance Value: 360 Ohm
Lead Channel Impedance Value: 480 Ohm
Lead Channel Impedance Value: 780 Ohm
Lead Channel Pacing Threshold Amplitude: 0.625 V
Lead Channel Pacing Threshold Amplitude: 0.75 V
Lead Channel Pacing Threshold Amplitude: 1 V
Lead Channel Pacing Threshold Pulse Width: 0.5 ms
Lead Channel Pacing Threshold Pulse Width: 0.5 ms
Lead Channel Pacing Threshold Pulse Width: 0.5 ms
Lead Channel Sensing Intrinsic Amplitude: 11.9 mV
Lead Channel Sensing Intrinsic Amplitude: 3.5 mV
Lead Channel Setting Pacing Amplitude: 2 V
Lead Channel Setting Pacing Amplitude: 2 V
Lead Channel Setting Pacing Amplitude: 2 V
Lead Channel Setting Pacing Pulse Width: 0.5 ms
Lead Channel Setting Pacing Pulse Width: 0.5 ms
Lead Channel Setting Sensing Sensitivity: 0.5 mV
Pulse Gen Serial Number: 7157119

## 2021-03-30 ENCOUNTER — Ambulatory Visit (INDEPENDENT_AMBULATORY_CARE_PROVIDER_SITE_OTHER): Payer: BC Managed Care – PPO

## 2021-03-30 DIAGNOSIS — I5022 Chronic systolic (congestive) heart failure: Secondary | ICD-10-CM

## 2021-04-06 ENCOUNTER — Ambulatory Visit (INDEPENDENT_AMBULATORY_CARE_PROVIDER_SITE_OTHER): Payer: BC Managed Care – PPO

## 2021-04-06 DIAGNOSIS — Z95810 Presence of automatic (implantable) cardiac defibrillator: Secondary | ICD-10-CM

## 2021-04-06 DIAGNOSIS — I5022 Chronic systolic (congestive) heart failure: Secondary | ICD-10-CM | POA: Diagnosis not present

## 2021-04-06 NOTE — Progress Notes (Signed)
EPIC Encounter for ICM Monitoring  Patient Name: Gary Williamson is a 55 y.o. male Date: 04/06/2021 Primary Care Physican: [name], NP Primary Cardiologist: [name] Electrophysiologist: [name] Bi-V Pacing:  96%     03/24/2021 Office Weight: 343 lbs   AT/AF Burden: <1% (taking Eliquis) Battery Longevity: 5.3 months     Attempted call to patient and unable to reach.  Left detailed message per DPR regarding transmission. Transmission reviewed.    Corvue Thoracic impedance suggesting possible fluid accumulation starting 12/4.   Impedance pattern suggesting alternating fluid levels with days of possible fluid accumulation days followed by return to normal.     Prescribed: Torsemide 20 mg take 2 tablets (40 mg total) daily. Potassium 20 mEq take 1 tablet daily Spironolactone 25 mg take 1 tablet daily   Labs: 03/24/2021 Creatinine 1.15, BUN 12, Potassium 3.7, Sodium 144, GFR 74 02/24/2021 Creatinine 1.36, BUN 16, Potassium 4.1, Sodium 142, GFR 61 08/18/2020 Creatinine 1.19, BUN 13, Potassium 3.5, Sodium 143, GFR >60 A complete set of results can be found in Results Review.   Recommendations:  Left voice mail with ICM number and encouraged to call if experiencing any fluid symptoms.   Follow-up plan: ICM clinic phone appointment on 04/06/2021.   91 day device clinic remote transmission 04/29/2021.     EP/Cardiology Office Visits:  06/29/2021 with Oda Kilts, PA.    Copy of ICM check sent to Dr. [name].  Will send to Dr [name] for review if patient is reached.   3 month ICM trend: 04/06/2021.    12-14 Month ICM trend:       [name], RN 04/06/2021 4:27 PM

## 2021-04-08 NOTE — Progress Notes (Signed)
Remote ICD transmission.   

## 2021-04-14 ENCOUNTER — Ambulatory Visit (INDEPENDENT_AMBULATORY_CARE_PROVIDER_SITE_OTHER): Payer: BC Managed Care – PPO

## 2021-04-14 DIAGNOSIS — Z95810 Presence of automatic (implantable) cardiac defibrillator: Secondary | ICD-10-CM

## 2021-04-14 DIAGNOSIS — I5022 Chronic systolic (congestive) heart failure: Secondary | ICD-10-CM

## 2021-04-15 NOTE — Progress Notes (Signed)
EPIC Encounter for ICM Monitoring  Patient Name: Gary Williamson is a 55 y.o. male Date: 04/15/2021 Primary Care Physican: [name], NP Primary Cardiologist: [name] Electrophysiologist: [name] Bi-V Pacing:  93%     03/24/2021 Office Weight: 343 lbs   AT/AF Burden: <1% (taking Eliquis) Battery Longevity: 5.3 months     Transmission reviewed.   Corvue Thoracic impedance suggesting fluid levels returned to normal.   Impedance pattern suggesting alternating fluid levels with days of possible fluid accumulation days followed by return to normal.     Prescribed: Torsemide 20 mg take 2 tablets (40 mg total) daily. Potassium 20 mEq take 1 tablet daily Spironolactone 25 mg take 1 tablet daily   Labs: 03/24/2021 Creatinine 1.15, BUN 12, Potassium 3.7, Sodium 144, GFR 74 02/24/2021 Creatinine 1.36, BUN 16, Potassium 4.1, Sodium 142, GFR 61 08/18/2020 Creatinine 1.19, BUN 13, Potassium 3.5, Sodium 143, GFR >60 A complete set of results can be found in Results Review.   Recommendations:  No changes.   Follow-up plan: ICM clinic phone appointment on 05/18/2021.   91 day device clinic remote transmission 04/29/2021.     EP/Cardiology Office Visits:  06/29/2021 with Oda Kilts, PA.    Copy of ICM check sent to Dr. [name].  3 month ICM trend: 04/14/2021.    12-14 Month ICM trend:       [name], RN 04/15/2021 8:23 AM

## 2021-05-18 ENCOUNTER — Ambulatory Visit (INDEPENDENT_AMBULATORY_CARE_PROVIDER_SITE_OTHER): Payer: BC Managed Care – PPO

## 2021-05-18 DIAGNOSIS — Z95810 Presence of automatic (implantable) cardiac defibrillator: Secondary | ICD-10-CM

## 2021-05-18 DIAGNOSIS — I5022 Chronic systolic (congestive) heart failure: Secondary | ICD-10-CM | POA: Diagnosis not present

## 2021-05-19 ENCOUNTER — Telehealth: Payer: Self-pay

## 2021-05-19 NOTE — Telephone Encounter (Signed)
Remote ICM transmission received.  Attempted call to patient regarding ICM remote transmission and left detailed message per DPR to return call.   

## 2021-05-19 NOTE — Progress Notes (Signed)
EPIC Encounter for ICM Monitoring  Patient Name: Gary Williamson is a 56 y.o. male Date: 05/19/2021 Primary Care Physican: [name], NP Primary Cardiologist: [name] Electrophysiologist: [name] Bi-V Pacing:  94%     03/24/2021 Office Weight: 343 lbs   AT/AF Burden: <1% (taking Eliquis) Battery Longevity: 4 months     Attempted call to patient and unable to reach.  Left detailed message per DPR regarding transmission. Transmission reviewed.    Corvue Thoracic impedance suggesting possible fluid accumulation starting 1/14.   Impedance pattern suggesting alternating fluid levels with days of possible fluid accumulation days followed by return to normal.     Prescribed: Torsemide 20 mg take 2 tablets (40 mg total) daily. Potassium 20 mEq take 1 tablet daily Spironolactone 25 mg take 1 tablet daily   Labs: 03/24/2021 Creatinine 1.15, BUN 12, Potassium 3.7, Sodium 144, GFR 74 02/24/2021 Creatinine 1.36, BUN 16, Potassium 4.1, Sodium 142, GFR 61 08/18/2020 Creatinine 1.19, BUN 13, Potassium 3.5, Sodium 143, GFR >60 A complete set of results can be found in Results Review.   Recommendations: Left voice mail with ICM number and encouraged to call if experiencing any fluid symptoms.   Follow-up plan: ICM clinic phone appointment on 05/25/2021 to recheck fluid levels.   91 day device clinic remote transmission 04/29/2021.     EP/Cardiology Office Visits:  06/29/2021 with Oda Kilts, PA.    Copy of ICM check sent to Dr. [name].  Will send to Dr [name] for review if patient is reached.    3 month ICM trend: 05/18/2021.    12-14 Month ICM trend:     [name], RN 05/19/2021 12:52 PM

## 2021-05-25 ENCOUNTER — Ambulatory Visit (INDEPENDENT_AMBULATORY_CARE_PROVIDER_SITE_OTHER): Payer: BC Managed Care – PPO

## 2021-05-25 DIAGNOSIS — I5022 Chronic systolic (congestive) heart failure: Secondary | ICD-10-CM

## 2021-05-25 DIAGNOSIS — Z95810 Presence of automatic (implantable) cardiac defibrillator: Secondary | ICD-10-CM

## 2021-05-27 NOTE — Progress Notes (Signed)
EPIC Encounter for ICM Monitoring  Patient Name: Gary Williamson is a 56 y.o. male Date: 05/27/2021 Primary Care Physican: [name], NP Primary Cardiologist: [name] Electrophysiologist: [name] Bi-V Pacing:  94%     03/24/2021 Office Weight: 343 lbs   AT/AF Burden: <1% (taking Eliquis) Battery Longevity: 4 months     Transmission reviewed.    Corvue Thoracic impedance suggesting fluid levels returned to normal.   Impedance pattern suggesting alternating fluid levels with days of possible fluid accumulation days followed by return to normal.     Prescribed: Torsemide 20 mg take 2 tablets (40 mg total) daily. Potassium 20 mEq take 1 tablet daily Spironolactone 25 mg take 1 tablet daily   Labs: 03/24/2021 Creatinine 1.15, BUN 12, Potassium 3.7, Sodium 144, GFR 74 02/24/2021 Creatinine 1.36, BUN 16, Potassium 4.1, Sodium 142, GFR 61 08/18/2020 Creatinine 1.19, BUN 13, Potassium 3.5, Sodium 143, GFR >60 A complete set of results can be found in Results Review.   Recommendations: No changes.   Follow-up plan: ICM clinic phone appointment on 06/22/2021.   91 day device clinic remote transmission 08/03/2020.     EP/Cardiology Office Visits:  06/29/2021 with Oda Kilts, PA.    Copy of ICM check sent to Dr. [name].    3 month ICM trend: 05/22/2021.    [name], RN 05/27/2021 8:51 AM

## 2021-06-01 ENCOUNTER — Ambulatory Visit (INDEPENDENT_AMBULATORY_CARE_PROVIDER_SITE_OTHER): Payer: BC Managed Care – PPO

## 2021-06-01 DIAGNOSIS — I428 Other cardiomyopathies: Secondary | ICD-10-CM

## 2021-06-03 LAB — CUP PACEART REMOTE DEVICE CHECK
Battery Remaining Longevity: 4 mo
Battery Remaining Percentage: 4 %
Battery Voltage: 2.62 V
Brady Statistic AP VP Percent: 1.2 %
Brady Statistic AP VS Percent: 1 %
Brady Statistic AS VP Percent: 94 %
Brady Statistic AS VS Percent: 4.9 %
Brady Statistic RA Percent Paced: 1.1 %
Date Time Interrogation Session: 20230201060224
HighPow Impedance: 65 Ohm
HighPow Impedance: 65 Ohm
Implantable Lead Implant Date: 20150421
Implantable Lead Implant Date: 20150421
Implantable Lead Implant Date: 20150421
Implantable Lead Location: 753857
Implantable Lead Location: 753859
Implantable Lead Location: 753860
Implantable Pulse Generator Implant Date: 20150421
Lead Channel Impedance Value: 360 Ohm
Lead Channel Impedance Value: 400 Ohm
Lead Channel Impedance Value: 750 Ohm
Lead Channel Pacing Threshold Amplitude: 0.625 V
Lead Channel Pacing Threshold Amplitude: 0.75 V
Lead Channel Pacing Threshold Amplitude: 1 V
Lead Channel Pacing Threshold Pulse Width: 0.5 ms
Lead Channel Pacing Threshold Pulse Width: 0.5 ms
Lead Channel Pacing Threshold Pulse Width: 0.5 ms
Lead Channel Sensing Intrinsic Amplitude: 11.9 mV
Lead Channel Sensing Intrinsic Amplitude: 3.5 mV
Lead Channel Setting Pacing Amplitude: 2 V
Lead Channel Setting Pacing Amplitude: 2 V
Lead Channel Setting Pacing Amplitude: 2 V
Lead Channel Setting Pacing Pulse Width: 0.5 ms
Lead Channel Setting Pacing Pulse Width: 0.5 ms
Lead Channel Setting Sensing Sensitivity: 0.5 mV
Pulse Gen Serial Number: 7157119

## 2021-06-09 NOTE — Progress Notes (Signed)
Remote ICD transmission.   

## 2021-06-09 NOTE — Addendum Note (Signed)
Addended by: Cheri Kearns A on: 06/09/2021 01:28 PM   Modules accepted: Level of Service

## 2021-06-25 NOTE — Progress Notes (Unsigned)
Electrophysiology Office Note Date: 06/25/2021  ID:  Gary Williamson, DOB 1965/07/03, MRN [id number]  PCP: [name], NP Primary Cardiologist: None Electrophysiologist: [name], MD   CC: Routine ICD follow-up  Gary Williamson is a 56 y.o. male seen today for [name], MD for routine electrophysiology followup.  Since last being seen in our clinic the patient reports doing ***.  he denies chest pain, palpitations, dyspnea, PND, orthopnea, nausea, vomiting, dizziness, syncope, edema, weight gain, or early satiety. {He/she (caps):30048} has not had ICD shocks.   Device History: St. Jude BiV ICD implanted 2015 for chronic systolic CHF  Past Medical History:  Diagnosis Date   Acute idiopathic pericarditis 03/24/2010   Overview:  Viral Idiopathic Pericarditis    CHF (congestive heart failure) (HCC)    Chronic systolic heart failure ([place])    Colon cancer screening    Hematuria, microscopic 05/15/2013   05/15/2013 visit with [name], MD at [facility]. Normal CT scan. Recommended annual urine for microscopy and serum creatinine x 3 years. 04/20/2013 visit with [name], MD at [facility].    Hypertension    LBBB (left bundle branch block)    Chronic   Leg edema    NICM (nonischemic cardiomyopathy) ([place])    Probably nonischemic; LHC in 2002/03 no sig CAD per report (done in [place]). Cardiomyopathy for 9-10 yrs. 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 Echo 5/11: severe LVE, EF <20%, diff HK, mod dias dys, sev. LAE. Hosp for CHF in 5/11. Ad. MV 6/11: EF 21%, mild per-infart ischemia. LHC (7/11): EF 15%, no CAD, LVEDP 22; Echo (10/11): EF 20-25%;  Echo (12/12):  EF 20%, mod LVH, Gr 1 DD, diff HK, mild LAE   OSA (obstructive sleep apnea)    cpap not  used   Prediabetes 08/18/2020   Pure hypercholesterolemia    Severe obesity ([place])    Sleep apnea    Vitamin D deficiency    Past Surgical History:  Procedure Laterality Date    BI-VENTRICULAR IMPLANTABLE CARDIOVERTER DEFIBRILLATOR N/A 08/21/2013   Procedure: BI-VENTRICULAR IMPLANTABLE CARDIOVERTER DEFIBRILLATOR  (CRT-D);  Surgeon: Coralyn Mark, MD;  Location: [facility] CATH LAB;  Service: Cardiovascular;  Laterality: N/A;   CARDIAC CATHETERIZATION  2002 or 2003   COLONOSCOPY N/A 09/16/2014   Procedure: COLONOSCOPY;  Surgeon: [name], MD;  Location: [facility] ENDOSCOPY;  Service: Endoscopy;  Laterality: N/A;   COLONOSCOPY     LEFT HEART CATH  11/13/2009   Dr [name]   POLYPECTOMY      Current Outpatient Medications  Medication Sig Dispense Refill   apixaban (ELIQUIS) 5 MG TABS tablet Take 1 tablet (5 mg total) by mouth 2 (two) times daily. 60 tablet 2   blood glucose meter kit and supplies Dispense based on patient and insurance preference. Check blood sugar every morning before eating and up to three additional times a day as needed (FOR ICD-10 E10.9, E11.9). 1 each 0   Cholecalciferol (VITAMIN D) 2000 units CAPS Take by mouth daily.     dapagliflozin propanediol (FARXIGA) 10 MG TABS tablet Take 1 tablet (10 mg total) by mouth daily. Needs appt 90 tablet 0   fish oil-omega-3 fatty acids 1000 MG capsule Take 1 capsule (1 g total) by mouth daily. 30 capsule 2   Lancets (ONETOUCH DELICA PLUS [id number]) MISC USE 1 LANCET TO CHECK GLUCOSE EVERY MORNING BEFORE EATING AND UP TO 3 ADDITIONAL TIMES PER DAY AS NEEDED 100 each 1   metFORMIN (  GLUCOPHAGE) 500 MG tablet Take 2 tabs (1050m) by mouth with breakfast and 2 tabs (10063m by mouth with dinner daily. Full dose of 200030mer day. 120 tablet 5   metoprolol succinate (TOPROL-XL) 100 MG 24 hr tablet Take 1 tablet (100 mg total) by mouth daily. Take with or immediately following a meal. 90 tablet 3   Multiple Vitamin (MULTIVITAMIN) tablet Take 1 tablet by mouth daily.     ONETOUCH VERIO test strip USE 1 STRIP TO CHECK BLOOD SUGAR EVERY MORNING BEFORE EATING AND UP TO 3 ADDITIONAL TIMES PER DAY AS NEEDED 100 each 0   potassium  chloride SA (KLOR-CON) 20 MEQ tablet Take 1 tablet (20 mEq total) by mouth daily. 90 tablet 1   rosuvastatin (CRESTOR) 20 MG tablet Take 1 tablet (20 mg total) by mouth daily. 30 tablet 3   sacubitril-valsartan (ENTRESTO) 97-103 MG Take 1 tablet by mouth 2 (two) times daily. Please call to schedule an appointment for future refills. 1st attempt 60 tablet 3   spironolactone (ALDACTONE) 25 MG tablet Take 1 tablet (25 mg total) by mouth daily. 90 tablet 1   torsemide (DEMADEX) 20 MG tablet Take 2 tablets (40 mg total) by mouth daily. 180 tablet 3   No current facility-administered medications for this visit.    Allergies:   Patient has no known allergies.   Social History: Social History   Socioeconomic History   Marital status: Married    Spouse name: Felicia   Number of children: 2   Years of education: Not on file   Highest education level: Not on file  Occupational History   Occupation: DirSpecial educational needs teachersources for [employer]    Comment: Full-time    Employer: [employer]   Smoking status: Never   Smokeless tobacco: Never  Substance and Sexual Activity   Alcohol use: Yes    Alcohol/week: 0.0 - 2.0 standard drinks    Comment: rare to occ.   Drug use: No   Sexual activity: Yes    Birth control/protection: Condom  Other Topics Concern   Not on file  Social History Narrative   Marital status: married x 17 years; happily married      Children: 2 children (15,12)      Lives in [place] his wife and 2 children      Employment: [employer]; HR Solicitor12 years; likes work.  Lots of traveling.       Tobacco: none      Alcohol:  1 drink per week      Exercise:  Walking three days per week; 20 minutes.       Seatbelt: 100%   Social Determinants of HeaRadio broadcast assistantrain: Not on file  Food Insecurity: Not on file  Transportation Needs: Not on file  Physical Activity: Not on file  Stress: Not on file  Social Connections: Not on file   Intimate Partner Violence: Not on file    Family History: Family History  Problem Relation Age of Onset   Hypertension Mother    Alzheimer's disease Mother    Hyperlipidemia Mother    Breast cancer Mother    Arthritis Father    Dementia Father    Hypertension Father    Alzheimer's disease Father    COPD Brother    Stroke Maternal Grandmother    Coronary artery disease Neg Hx    Heart failure Neg Hx    Heart disease Neg Hx  Colon cancer Neg Hx    Colon polyps Neg Hx    Esophageal cancer Neg Hx    Rectal cancer Neg Hx    Stomach cancer Neg Hx     Review of Systems: All other systems reviewed and are otherwise negative except as noted above.   Physical Exam: There were no vitals filed for this visit.   GEN- The patient is well appearing, alert and oriented x 3 today.   HEENT: normocephalic, atraumatic; sclera clear, conjunctiva pink; hearing intact; oropharynx clear; neck supple, no JVP Lymph- no cervical lymphadenopathy Lungs- Clear to ausculation bilaterally, normal work of breathing.  No wheezes, rales, rhonchi Heart- Regular rate and rhythm, no murmurs, rubs or gallops, PMI not laterally displaced GI- soft, non-tender, non-distended, bowel sounds present, no hepatosplenomegaly Extremities- no clubbing or cyanosis. No edema; DP/PT/radial pulses 2+ bilaterally MS- no significant deformity or atrophy Skin- warm and dry, no rash or lesion; ICD pocket well healed Psych- euthymic mood, full affect Neuro- strength and sensation are intact  ICD interrogation- reviewed in detail today,  See PACEART report  EKG:  EKG is ordered today. Personal review of EKG ordered today shows ***  Recent Labs: 02/24/2021: ALT 20; Magnesium 2.1; TSH 3.000 03/24/2021: BUN 12; Creatinine, Ser 1.15; Hemoglobin 14.3; Platelets 220; Potassium 3.7; Sodium 144   Wt Readings from Last 3 Encounters:  03/24/21 (!) 347 lb (157.4 kg)  02/24/21 (!) 345 lb (156.5 kg)  02/24/21 (!) 345 lb (156.5  kg)     Other studies Reviewed: Additional studies/ records that were reviewed today include: Previous EP office notes.   Assessment and Plan:  1.  Chronic systolic dysfunction s/p St. Jude CRT-D  Echo 03/2021 LVEF 35-40% euvolemic today Stable on an appropriate medical regimen Normal ICD function See Pace Art report No changes today  2. PAF New diagnosis Continue Eliquis 5 mg BID Shocked for AF RVR. Reviewed recent episodes along with industry. Appears to consistently have A>V, Atrially lead and doesn't appear to have had any true VT.  Will not recommend driving restrictions at this time, as he was also asymptomatic.  Given the speed of his arrhythmias I did not change his settings at this time. If recurs, Could change VT zone (<200) to ATP only. If Atrial will likely be ineffective, if ventricular will either be successful or possibly speed up to VF zone for shock.  Discussed AAD including Tikosyn, Sotalol, and Amiodarone. Flecainide poor option given CHF. Prefers to hold off for now.  Continue toprol 100 mg daily.  Poor candidate for ablation given weight.    Current medicines are reviewed at length with the patient today.    Labs/ tests ordered today include:  No orders of the defined types were placed in this encounter.    Disposition:   Follow up with {Blank single:19197::"Dr. [name]","Dr. Arlan Organ. Klein","Dr. Camnitz","Dr. Lambert","EP APP"} in {Blank single:19197::"2 weeks","4 weeks","3 months","6 months","12 months","as usual post gen change"}    Signed, Annamaria Helling  06/25/2021 12:24 PM  [place] [address] [phone] (office) [phone] (fax)

## 2021-06-28 ENCOUNTER — Other Ambulatory Visit (HOSPITAL_BASED_OUTPATIENT_CLINIC_OR_DEPARTMENT_OTHER): Payer: Self-pay | Admitting: Nurse Practitioner

## 2021-06-28 DIAGNOSIS — I5022 Chronic systolic (congestive) heart failure: Secondary | ICD-10-CM

## 2021-06-28 DIAGNOSIS — R7303 Prediabetes: Secondary | ICD-10-CM

## 2021-06-28 DIAGNOSIS — I428 Other cardiomyopathies: Secondary | ICD-10-CM

## 2021-06-29 ENCOUNTER — Encounter: Payer: BC Managed Care – PPO | Admitting: Student

## 2021-06-29 DIAGNOSIS — I480 Paroxysmal atrial fibrillation: Secondary | ICD-10-CM

## 2021-06-29 DIAGNOSIS — I5022 Chronic systolic (congestive) heart failure: Secondary | ICD-10-CM

## 2021-06-30 ENCOUNTER — Ambulatory Visit (INDEPENDENT_AMBULATORY_CARE_PROVIDER_SITE_OTHER): Payer: BC Managed Care – PPO

## 2021-06-30 DIAGNOSIS — Z95810 Presence of automatic (implantable) cardiac defibrillator: Secondary | ICD-10-CM

## 2021-06-30 DIAGNOSIS — I5022 Chronic systolic (congestive) heart failure: Secondary | ICD-10-CM

## 2021-07-02 ENCOUNTER — Ambulatory Visit (INDEPENDENT_AMBULATORY_CARE_PROVIDER_SITE_OTHER): Payer: BC Managed Care – PPO

## 2021-07-02 DIAGNOSIS — I428 Other cardiomyopathies: Secondary | ICD-10-CM

## 2021-07-03 NOTE — Progress Notes (Signed)
EPIC Encounter for ICM Monitoring  Patient Name: Gary Williamson is a 56 y.o. male Date: 07/03/2021 Primary Care Physican: [name], NP Primary Cardiologist: [name] Electrophysiologist: [name] Bi-V Pacing:  95%     03/24/2021 Office Weight: 343 lbs   AT/AF Burden: <1% (taking Eliquis) Battery Longevity: <3 months   Transmission reviewed.    Corvue Thoracic impedance suggesting suggesting normal fluid levels per updated trend view through 3/2.   Impedance pattern suggesting alternating fluid levels with days of possible fluid accumulation days followed by return to normal.     Prescribed: Torsemide 20 mg take 2 tablets (40 mg total) daily. Potassium 20 mEq take 1 tablet daily Spironolactone 25 mg take 1 tablet daily   Labs: 03/24/2021 Creatinine 1.15, BUN 12, Potassium 3.7, Sodium 144, GFR 74 02/24/2021 Creatinine 1.36, BUN 16, Potassium 4.1, Sodium 142, GFR 61 08/18/2020 Creatinine 1.19, BUN 13, Potassium 3.5, Sodium 143, GFR >60 A complete set of results can be found in Results Review.   Recommendations: No changes.   Follow-up plan: ICM clinic phone appointment on 08/04/2021.   91 day device clinic remote transmission 08/03/2020.     EP/Cardiology Office Visits:   Missed 06/29/2021 with Oda Kilts, PA and not rescheduled.    Copy of ICM check sent to Dr. [name].    Updated Trend View through 07/02/2021   3 month ICM trend: 06/30/2021.    12-14 Month ICM trend:     [name], RN 07/03/2021 3:59 PM

## 2021-07-07 LAB — CUP PACEART REMOTE DEVICE CHECK
Battery Remaining Longevity: 1 mo
Battery Remaining Percentage: 1 %
Battery Voltage: 2.6 V
Brady Statistic AP VP Percent: 1.6 %
Brady Statistic AP VS Percent: 1 %
Brady Statistic AS VP Percent: 94 %
Brady Statistic AS VS Percent: 4.9 %
Brady Statistic RA Percent Paced: 1.4 %
Date Time Interrogation Session: 20230306193320
HighPow Impedance: 75 Ohm
HighPow Impedance: 75 Ohm
Implantable Lead Implant Date: 20150421
Implantable Lead Implant Date: 20150421
Implantable Lead Implant Date: 20150421
Implantable Lead Location: 753857
Implantable Lead Location: 753859
Implantable Lead Location: 753860
Implantable Pulse Generator Implant Date: 20150421
Lead Channel Impedance Value: 410 Ohm
Lead Channel Impedance Value: 510 Ohm
Lead Channel Impedance Value: 800 Ohm
Lead Channel Pacing Threshold Amplitude: 0.625 V
Lead Channel Pacing Threshold Amplitude: 0.75 V
Lead Channel Pacing Threshold Amplitude: 1 V
Lead Channel Pacing Threshold Pulse Width: 0.5 ms
Lead Channel Pacing Threshold Pulse Width: 0.5 ms
Lead Channel Pacing Threshold Pulse Width: 0.5 ms
Lead Channel Sensing Intrinsic Amplitude: 11.9 mV
Lead Channel Sensing Intrinsic Amplitude: 4.4 mV
Lead Channel Setting Pacing Amplitude: 2 V
Lead Channel Setting Pacing Amplitude: 2 V
Lead Channel Setting Pacing Amplitude: 2 V
Lead Channel Setting Pacing Pulse Width: 0.5 ms
Lead Channel Setting Pacing Pulse Width: 0.5 ms
Lead Channel Setting Sensing Sensitivity: 0.5 mV
Pulse Gen Serial Number: 7157119

## 2021-07-08 ENCOUNTER — Telehealth: Payer: Self-pay

## 2021-07-08 NOTE — Telephone Encounter (Signed)
Abbott alert, device has reached ERI 3/7 ?Route to triage. ? ?Patient called to advised ICD reached ER. Advised I will forward to scheduling and someone will call to make apt. Patient currently has recall in. Patient voiced understanding and appreciative of call.  ?

## 2021-07-09 ENCOUNTER — Other Ambulatory Visit: Payer: Self-pay

## 2021-07-09 ENCOUNTER — Ambulatory Visit (INDEPENDENT_AMBULATORY_CARE_PROVIDER_SITE_OTHER): Payer: BC Managed Care – PPO | Admitting: Internal Medicine

## 2021-07-09 VITALS — BP 146/84 | HR 86 | Ht 73.0 in | Wt 352.2 lb

## 2021-07-09 DIAGNOSIS — I480 Paroxysmal atrial fibrillation: Secondary | ICD-10-CM

## 2021-07-09 DIAGNOSIS — Z95810 Presence of automatic (implantable) cardiac defibrillator: Secondary | ICD-10-CM | POA: Diagnosis not present

## 2021-07-09 DIAGNOSIS — I10 Essential (primary) hypertension: Secondary | ICD-10-CM | POA: Diagnosis not present

## 2021-07-09 DIAGNOSIS — I5022 Chronic systolic (congestive) heart failure: Secondary | ICD-10-CM

## 2021-07-09 DIAGNOSIS — D6869 Other thrombophilia: Secondary | ICD-10-CM | POA: Diagnosis not present

## 2021-07-09 DIAGNOSIS — I428 Other cardiomyopathies: Secondary | ICD-10-CM

## 2021-07-09 NOTE — Progress Notes (Signed)
? ? ?PCP: Early, Coralee Pesa, NP ?Primary Cardiologist: Dr [name] ?Primary EP: Dr [name] ? ?Gary Williamson is a 56 y.o. male who presents today for routine electrophysiology followup.  Since last being seen in our clinic, the patient reports doing very well.  Today, he denies symptoms of palpitations, chest pain, shortness of breath,  lower extremity edema, dizziness, presyncope, syncope, or ICD shocks.  The patient is otherwise without complaint today.  ? ?Past Medical History:  ?Diagnosis Date  ? 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 Overview:  Viral Idiopathic Pericarditis   ? CHF (congestive heart failure) ([place])   ? Chronic systolic heart failure ([place])   ? Colon cancer screening   ? Hematuria, microscopic 05/15/2013  ? 05/15/2013 visit with [name], MD at [facility]. Normal CT scan. Recommended annual urine for microscopy and serum creatinine x 3 years. 04/20/2013 visit with [name], MD at [facility].   ? Hypertension   ? LBBB (left bundle branch block)   ? Chronic  ? Leg edema   ? NICM (nonischemic cardiomyopathy) ([place])   ? Probably nonischemic; LHC in 2002/03 no sig CAD per report (done in [place]). Cardiomyopathy for 9-10 yrs. 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 Echo 5/11: severe LVE, EF <20%, diff HK, mod dias dys, sev. LAE. Hosp for CHF in 5/11. Ad. MV 6/11: EF 21%, mild per-infart ischemia. LHC (7/11): EF 15%, no CAD, LVEDP 22; Echo (10/11): EF 20-25%;  Echo (12/12):  EF 20%, mod LVH, Gr 1 DD, diff HK, mild LAE  ? OSA (obstructive sleep apnea)   ? cpap not  used  ? Prediabetes 08/18/2020  ? Pure hypercholesterolemia   ? Severe obesity ([place])   ? Sleep apnea   ? Vitamin D deficiency   ? ?Past Surgical History:  ?Procedure Laterality Date  ? BI-VENTRICULAR IMPLANTABLE CARDIOVERTER DEFIBRILLATOR N/A 08/21/2013  ? Procedure: BI-VENTRICULAR IMPLANTABLE CARDIOVERTER DEFIBRILLATOR  (CRT-D);  Surgeon: Coralyn Mark, MD;  Location: [facility] CATH LAB;  Service:  Cardiovascular;  Laterality: N/A;  ? CARDIAC CATHETERIZATION  2002 or 2003  ? COLONOSCOPY N/A 09/16/2014  ? Procedure: COLONOSCOPY;  Surgeon: [name], MD;  Location: [facility] ENDOSCOPY;  Service: Endoscopy;  Laterality: N/A;  ? COLONOSCOPY    ? LEFT HEART CATH  11/13/2009  ? Dr [name]  ? POLYPECTOMY    ? ? ?ROS- all systems are reviewed and negative except as per HPI above ? ?Medicines reviewed ? ? ?Physical Exam: ?Vitals:  ? 07/09/21 0943  ?BP: (!) 146/84  ?Pulse: 86  ?SpO2: 95%  ?Weight: (!) 352 lb 3.2 oz (159.8 kg)  ?Height: '6\' 1"'$  (1.854 m)  ?  ? ?GEN- The patient is well appearing, alert and oriented x 3 today.   ?Head- normocephalic, atraumatic ?Eyes-  Sclera clear, conjunctiva pink ?Ears- hearing intact ?Oropharynx- clear ?Lungs- Clear to ausculation bilaterally, normal work of breathing ?Chest- ICD pocket is well healed ?Heart- Regular rate and rhythm, no murmurs, rubs or gallops, PMI not laterally displaced ?GI- soft, NT, ND, + BS ?Extremities- no clubbing, cyanosis, or edema ? ?ICD interrogation- reviewed in detail today,  See PACEART report ? ?ekg tracing ordered today is personally reviewed and shows sinus with Biv pacing ? ?Wt Readings from Last 3 Encounters:  ?07/09/21 (!) 352 lb 3.2 oz (159.8 kg)  ?03/24/21 (!) 347 lb (157.4 kg)  ?02/24/21 (!) 345 lb (156.5 kg)  ? ? ?Assessment and Plan: ? ?1.  Chronic systolic dysfunction/ nonischemic CM ?euvolemic today ?Stable  on an appropriate medical regimen ?Normal BiV ICD function but has reached ERI ?Clinically improved with CRT but EF remains 35% ?See Claudia Desanctis Art report ?No changes today ?he is not device dependant today ? followed in ICM device clinic ?Risks, benefits, and alternatives to BiV ICD pulse generator replacement were discussed in detail today.  The patient understands that risks include but are not limited to bleeding, infection, pneumothorax, perforation, tamponade, vascular damage, renal failure, MI, stroke, death, inappropriate shocks, damage to  his existing leads, and lead dislodgement and wishes to proceed.  We will therefore schedule the procedure at the next available time. ? ?2. Morbid obesity ?Body mass index is 46.47 kg/m?. ?Lifestyle modification is advised ? ?3. HTN ?Stable ?No change required today ? ?4. Paroxysmal atrial fibrillation ?Recently diagnosed ?Hold eliquis 24 hours prior to procedure ?Burden <1% by ICD interrogation ?He has had prior RVR for which he is on toprol. ? ?[name] MD, FACC ?07/09/2021 ?9:51 AM ? ?

## 2021-07-09 NOTE — Addendum Note (Signed)
Addended by: Cheri Kearns A on: 07/09/2021 01:06 PM ? ? Modules accepted: Level of Service ? ?

## 2021-07-09 NOTE — Patient Instructions (Signed)
Medication Instructions:  ?Your physician recommends that you continue on your current medications as directed. Please refer to the Current Medication list given to you today. ? ?Labwork: ?None ordered. ? ?Testing/Procedures: ?None ordered. ? ?Follow-Up: ? ?SEE INSTRUCTION LETTER ? ?Any Other Special Instructions Will Be Listed Below (If Applicable). ? ?If you need a refill on your cardiac medications before your next appointment, please call your pharmacy.  ? ?ICD Battery Change ?A battery usually lasts 5-15 years (6-7 years on average). A few times a year, you may be asked to visit your health care provider to have a full evaluation of your device. When the battery is low, your device will be completely replaced. Most often, this procedure is simpler than the first surgery because the wires (leads) that connect the device to the heart are already in place. ?What are the risks? ?Generally, this is a safe procedure. However, problems may occur, including: ?Bleeding. ?Infection. ?Nerve damage. ?Allergic reaction to medicines. ?Damage to the leads that go to the heart. ?What happens before the procedure? ?What happens during the procedure? ?An IV will be inserted into one of your veins. ?You will be given one or more of the following: ?A medicine to help you relax (sedative). ?A medicine to numb the area where the pacemaker is located (local anesthetic). ?Your health care provider will make an incision to reopen the pocket holding the pacemaker. ?The old pacemaker will be disconnected from the leads. ?The leads will be tested. ?If needed, the leads will be replaced. If the leads are functioning properly, the new pacemaker will be connected to the existing leads. ?A heart monitor and a pacemaker programmer will be used to make sure that the newly implanted pacemaker is working properly. ?The incision site will be closed with stitches (sutures), adhesive strips, or skin glue. ?A bandage (dressing) will be placed over  the pacemaker site. ?The procedure may vary among health care providers and hospitals. ?What happens after the procedure? ?Your blood pressure, heart rate, breathing rate, and blood oxygen level will be monitored until you leave the hospital or clinic. ?You may be given antibiotics. ?Your health care provider will tell you when your pacemaker will need to be tested again, or when to return to the office for removal of the dressing and sutures. ?If you were given a sedative during the procedure, it can affect you for several hours. Do not drive or operate machinery until your health care provider says that it is safe. ?You will be given a pacemaker identification card. This card lists the implant date, device model, and manufacturer of your pacemaker. ?Document Revised: 03/22/2019 Document Reviewed: 03/22/2019 ?Elsevier Patient Education ? 2022 [place]. ? ? ? ?

## 2021-07-09 NOTE — Progress Notes (Signed)
Remote ICD transmission.   

## 2021-08-04 ENCOUNTER — Ambulatory Visit (INDEPENDENT_AMBULATORY_CARE_PROVIDER_SITE_OTHER): Payer: BC Managed Care – PPO

## 2021-08-04 ENCOUNTER — Telehealth: Payer: Self-pay

## 2021-08-04 DIAGNOSIS — Z95810 Presence of automatic (implantable) cardiac defibrillator: Secondary | ICD-10-CM | POA: Diagnosis not present

## 2021-08-04 DIAGNOSIS — I5022 Chronic systolic (congestive) heart failure: Secondary | ICD-10-CM

## 2021-08-04 NOTE — Progress Notes (Signed)
EPIC Encounter for ICM Monitoring ? ?Patient Name: Gary Williamson is a 55 y.o. male ?Date: 08/04/2021 ?Primary Care Physican: Early, Coralee Pesa, NP ?Primary Cardiologist: [name] ?Electrophysiologist: [name] ?Bi-V Pacing:  95%     ?03/24/2021 Office Weight: 343 lbs ?  ?AT/AF Burden: <1% (taking Eliquis) ?  ?Attempted call to patient and unable to reach.  Left detailed message per DPR regarding transmission. Transmission reviewed.   Device Battery Replacement scheduled 4/21. ?  ?Corvue Thoracic impedance suggesting possible dryness starting 4/3.   Impedance suggesting alternating pattern of days with possible fluid accumulation days followed by return to normal or possible dryness.   ?  ?Prescribed: ?Torsemide 20 mg take 2 tablets (40 mg total) daily. ?Potassium 20 mEq take 1 tablet daily ?Spironolactone 25 mg take 1 tablet daily ?  ?Labs: ?08/10/2021 BMET scheduled ?03/24/2021 Creatinine 1.15, BUN 12, Potassium 3.7, Sodium 144, GFR 74 ?02/24/2021 Creatinine 1.36, BUN 16, Potassium 4.1, Sodium 142, GFR 61 ?08/18/2020 Creatinine 1.19, BUN 13, Potassium 3.5, Sodium 143, GFR >60 ?A complete set of results can be found in Results Review. ?  ?Recommendations:  Left voice mail with ICM number and encouraged to call if experiencing any fluid symptoms. ?  ?Follow-up plan: ICM clinic phone appointment on 08/12/2021 to recheck fluid levels.   91 day device clinic remote transmission 11/05/2020.   ?  ?EP/Cardiology Office Visits:   12/18/2021 with Dr [name].  ?  ?Copy of ICM check sent to Dr. [name].   ? ?3 month ICM trend: 08/04/2021. ? ? ? ?12-14 Month ICM trend:  ? ? ? ?[name], RN ?08/04/2021 ?8:58 AM ? ?

## 2021-08-04 NOTE — Telephone Encounter (Signed)
Remote ICM transmission received.  Attempted call to patient regarding ICM remote transmission and left detailed message per DPR.  Advised to return call for any fluid symptoms or questions. Next ICM remote transmission scheduled 08/12/2021.   ? ?

## 2021-08-08 ENCOUNTER — Other Ambulatory Visit (HOSPITAL_BASED_OUTPATIENT_CLINIC_OR_DEPARTMENT_OTHER): Payer: Self-pay | Admitting: Nurse Practitioner

## 2021-08-10 ENCOUNTER — Other Ambulatory Visit: Payer: BC Managed Care – PPO

## 2021-08-12 ENCOUNTER — Other Ambulatory Visit: Payer: BC Managed Care – PPO | Admitting: *Deleted

## 2021-08-12 ENCOUNTER — Ambulatory Visit (INDEPENDENT_AMBULATORY_CARE_PROVIDER_SITE_OTHER): Payer: BC Managed Care – PPO

## 2021-08-12 DIAGNOSIS — D6869 Other thrombophilia: Secondary | ICD-10-CM

## 2021-08-12 DIAGNOSIS — Z95810 Presence of automatic (implantable) cardiac defibrillator: Secondary | ICD-10-CM

## 2021-08-12 DIAGNOSIS — I5022 Chronic systolic (congestive) heart failure: Secondary | ICD-10-CM

## 2021-08-12 DIAGNOSIS — I10 Essential (primary) hypertension: Secondary | ICD-10-CM

## 2021-08-12 DIAGNOSIS — I480 Paroxysmal atrial fibrillation: Secondary | ICD-10-CM

## 2021-08-12 DIAGNOSIS — I428 Other cardiomyopathies: Secondary | ICD-10-CM

## 2021-08-12 LAB — BASIC METABOLIC PANEL
BUN/Creatinine Ratio: 15 (ref 9–20)
BUN: 19 mg/dL (ref 6–24)
CO2: 23 mmol/L (ref 20–29)
Calcium: 9.4 mg/dL (ref 8.7–10.2)
Chloride: 106 mmol/L (ref 96–106)
Creatinine, Ser: 1.26 mg/dL (ref 0.76–1.27)
Glucose: 123 mg/dL — ABNORMAL HIGH (ref 70–99)
Potassium: 3.9 mmol/L (ref 3.5–5.2)
Sodium: 145 mmol/L — ABNORMAL HIGH (ref 134–144)
eGFR: 67 mL/min/{1.73_m2} (ref >59)

## 2021-08-12 LAB — CBC WITH DIFFERENTIAL/PLATELET
Basophils Absolute: 0.1 10*3/uL (ref 0.0–0.2)
Basos: 1 %
EOS (ABSOLUTE): 0.3 10*3/uL (ref 0.0–0.4)
Eos: 3 %
Hematocrit: 43.2 % (ref 37.5–51.0)
Hemoglobin: 14.7 g/dL (ref 13.0–17.7)
Immature Grans (Abs): 0 10*3/uL (ref 0.0–0.1)
Immature Granulocytes: 0 %
Lymphocytes Absolute: 2.3 10*3/uL (ref 0.7–3.1)
Lymphs: 22 %
MCH: 29.1 pg (ref 26.6–33.0)
MCHC: 34 g/dL (ref 31.5–35.7)
MCV: 86 fL (ref 79–97)
Monocytes Absolute: 0.9 10*3/uL (ref 0.1–0.9)
Monocytes: 9 %
Neutrophils Absolute: 7.1 10*3/uL — ABNORMAL HIGH (ref 1.4–7.0)
Neutrophils: 65 %
Platelets: 243 10*3/uL (ref 150–450)
RBC: 5.05 x10E6/uL (ref 4.14–5.80)
RDW: 13.6 % (ref 11.6–15.4)
WBC: 10.7 10*3/uL (ref 3.4–10.8)

## 2021-08-12 NOTE — Progress Notes (Signed)
EPIC Encounter for ICM Monitoring ? ?Patient Name: Gary Williamson is a 56 y.o. male ?Date: 08/12/2021 ?Primary Care Physican: Early, Coralee Pesa, NP ?Primary Cardiologist: [name] ?Electrophysiologist: [name] ?Bi-V Pacing:  95%     ?07/09/2021 Office Weight: 352 lbs ?  ?AT/AF Burden: <1% (taking Eliquis) ?  ?Transmission reviewed.   Device Battery Replacement scheduled 4/21. ?  ?Corvue Thoracic impedance suggesting continued pattern of days with possible fluid accumulation days followed by return to normal or possible dryness.   ?  ?Prescribed: ?Torsemide 20 mg take 2 tablets (40 mg total) daily. ?Potassium 20 mEq take 1 tablet daily ?Spironolactone 25 mg take 1 tablet daily ?  ?Labs: ?08/12/2021 BMET scheduled ?03/24/2021 Creatinine 1.15, BUN 12, Potassium 3.7, Sodium 144, GFR 74 ?02/24/2021 Creatinine 1.36, BUN 16, Potassium 4.1, Sodium 142, GFR 61 ?08/18/2020 Creatinine 1.19, BUN 13, Potassium 3.5, Sodium 143, GFR >60 ?A complete set of results can be found in Results Review. ?  ?Recommendations:  No changes. ?  ?Follow-up plan: ICM clinic phone appointment on 10/05/2021.   91 day device clinic remote transmission 11/05/2020.   ?  ?EP/Cardiology Office Visits:   12/18/2021 with Dr [name].  ?  ?Copy of ICM check sent to Dr. [name].  ? ?3 month ICM trend: 08/12/2021. ? ? ? ?12-14 Month ICM trend:  ? ? ? ?[name], RN ?08/12/2021 ?8:11 AM ? ?

## 2021-08-19 NOTE — Pre-Procedure Instructions (Signed)
Attempted to call patient regarding procedure instructions.  No answer 

## 2021-08-21 ENCOUNTER — Ambulatory Visit (HOSPITAL_COMMUNITY)
Admission: RE | Disposition: A | Discharge status: Home / Self Care | Payer: BC Managed Care – PPO | Source: Home / Self Care | Attending: Internal Medicine

## 2021-08-21 ENCOUNTER — Ambulatory Visit (HOSPITAL_COMMUNITY)
Admission: RE | Admit: 2021-08-21 | Discharge: 2021-08-21 | Disposition: A | Discharge status: Home / Self Care | Payer: BC Managed Care – PPO | Attending: Internal Medicine | Admitting: Internal Medicine

## 2021-08-21 DIAGNOSIS — I447 Left bundle-branch block, unspecified: Secondary | ICD-10-CM | POA: Diagnosis not present

## 2021-08-21 DIAGNOSIS — I428 Other cardiomyopathies: Secondary | ICD-10-CM | POA: Diagnosis not present

## 2021-08-21 DIAGNOSIS — I4892 Unspecified atrial flutter: Secondary | ICD-10-CM | POA: Diagnosis not present

## 2021-08-21 DIAGNOSIS — I5022 Chronic systolic (congestive) heart failure: Secondary | ICD-10-CM | POA: Diagnosis not present

## 2021-08-21 DIAGNOSIS — Z4502 Encounter for adjustment and management of automatic implantable cardiac defibrillator: Secondary | ICD-10-CM | POA: Diagnosis not present

## 2021-08-21 DIAGNOSIS — I110 Hypertensive heart disease with heart failure: Secondary | ICD-10-CM | POA: Diagnosis not present

## 2021-08-21 DIAGNOSIS — I480 Paroxysmal atrial fibrillation: Secondary | ICD-10-CM | POA: Diagnosis not present

## 2021-08-21 HISTORY — PX: BIV ICD GENERATOR CHANGEOUT: EP1194

## 2021-08-21 LAB — GLUCOSE, CAPILLARY
Glucose-Capillary: 97 mg/dL (ref 70–99)
Glucose-Capillary: 86 mg/dL (ref 70–99)

## 2021-08-21 SURGERY — BIV ICD GENERATOR CHANGEOUT

## 2021-08-21 MED ORDER — LIDOCAINE HCL (PF) 1 % IJ SOLN
INTRAMUSCULAR | Status: DC | PRN
  Administered 2021-08-21: 60 mL

## 2021-08-21 MED ORDER — MIDAZOLAM HCL 5 MG/5ML IJ SOLN
INTRAMUSCULAR | Status: DC | PRN
  Administered 2021-08-21 (×2): 1 mg via INTRAVENOUS

## 2021-08-21 MED ORDER — SODIUM CHLORIDE 0.9% FLUSH: 3.0000 mL | Freq: Two times a day (BID) | INTRAVENOUS | Status: DC

## 2021-08-21 MED ORDER — ONDANSETRON HCL 4 MG/2ML IJ SOLN: 4.0000 mg | Freq: Four times a day (QID) | INTRAMUSCULAR | Status: DC | PRN

## 2021-08-21 MED ORDER — SODIUM CHLORIDE 0.9 % IV SOLN: INTRAVENOUS | Status: DC

## 2021-08-21 MED ORDER — POVIDONE-IODINE 10 % EX SWAB
2.0000 "application " | Freq: Once | CUTANEOUS | Status: AC
  Administered 2021-08-21: 2 via TOPICAL

## 2021-08-21 MED ORDER — SODIUM CHLORIDE 0.9 % IV SOLN
INTRAVENOUS | Status: AC
  Filled 2021-08-21: qty 2

## 2021-08-21 MED ORDER — ACETAMINOPHEN 325 MG PO TABS
325.0000 mg | ORAL_TABLET | ORAL | Status: DC | PRN
  Filled 2021-08-21: qty 2

## 2021-08-21 MED ORDER — SODIUM CHLORIDE 0.9 % IV SOLN
250.0000 mL | INTRAVENOUS | Status: DC | PRN
Start: 2021-08-21 — End: 2021-08-21

## 2021-08-21 MED ORDER — LIDOCAINE HCL 1 % IJ SOLN
INTRAMUSCULAR | Status: AC
  Filled 2021-08-21: qty 60

## 2021-08-21 MED ORDER — MIDAZOLAM HCL 5 MG/5ML IJ SOLN
INTRAMUSCULAR | Status: AC
  Filled 2021-08-21: qty 5

## 2021-08-21 MED ORDER — SODIUM CHLORIDE 0.9 % IV SOLN
80.0000 mg | INTRAVENOUS | Status: AC
  Administered 2021-08-21: 80 mg
  Filled 2021-08-21: qty 2

## 2021-08-21 MED ORDER — CEFAZOLIN IN SODIUM CHLORIDE 3-0.9 GM/100ML-% IV SOLN
3.0000 g | INTRAVENOUS | Status: AC
  Administered 2021-08-21: 3 g via INTRAVENOUS
  Filled 2021-08-21: qty 100

## 2021-08-21 MED ORDER — SODIUM CHLORIDE 0.9% FLUSH: 3.0000 mL | INTRAVENOUS | Status: DC | PRN

## 2021-08-21 MED ORDER — CHLORHEXIDINE GLUCONATE 4 % EX LIQD
4.0000 "application " | Freq: Once | CUTANEOUS | Status: DC
  Filled 2021-08-21: qty 60

## 2021-08-21 SURGICAL SUPPLY — 4 items
CABLE SURGICAL S-101-97-12 (CABLE) ×2 IMPLANT
ICD GALLANT HFCRTD CDHFA500Q (ICD Generator) ×1 IMPLANT
PAD DEFIB RADIO PHYSIO CONN (PAD) ×2 IMPLANT
TRAY PACEMAKER INSERTION (PACKS) ×2 IMPLANT

## 2021-08-21 NOTE — Discharge Instructions (Addendum)
Resume eliquis on Tuesday 08/25/21 ? ?Implantable Cardiac Device Battery Change, Care After ? ?This sheet gives you information about how to care for yourself after your procedure. Your health care provider may also give you more specific instructions. If you have problems or questions, contact your health care provider. ?What can I expect after the procedure? ?After your procedure, it is common to have: ?Pain or soreness at the site where the cardiac device was inserted. ?Swelling at the site where the cardiac device was inserted. ?You should received an information card for your new device in 4-8 weeks. ?Follow these instructions at home: ?Incision care  ?Keep the incision clean and dry. ?Do not take baths, swim, or use a hot tub until after your wound check.  ?Do not shower for at least 7 days, or as directed by your health care provider. ?Pat the area dry with a clean towel. Do not rub the area. This may cause bleeding. ?Follow instructions from your health care provider about how to take care of your incision. Make sure you: ?Leave stitches (sutures), skin glue, or adhesive strips in place. These skin closures may need to stay in place for 2 weeks or longer. If adhesive strip edges start to loosen and curl up, you may trim the loose edges. Do not remove adhesive strips completely unless your health care provider tells you to do that. ?Check your incision area every day for signs of infection. Check for: ?More redness, swelling, or pain. ?More fluid or blood. ?Warmth. ?Pus or a bad smell. ?Activity ?Do not lift anything that is heavier than 10 lb (4.5 kg) until your health care provider says it is okay to do so. ?For the first week, or as long as told by your health care provider: ?Avoid lifting your affected arm higher than your shoulder. ?After 1 week, Be gentle when you move your arms over your head. It is okay to raise your arm to comb your hair. ?Avoid strenuous exercise. ?Ask your health care provider when  it is okay to: ?Resume your normal activities. ?Return to work or school. ?Resume sexual activity. ?Eating and drinking ?Eat a heart-healthy diet. This should include plenty of fresh fruits and vegetables, whole grains, low-fat dairy products, and lean protein like chicken and fish. ?Limit alcohol intake to no more than 1 drink a day for non-pregnant women and 2 drinks a day for men. One drink equals 12 oz of beer, 5 oz of wine, or 1? oz of hard liquor. ?Check ingredients and nutrition facts on packaged foods and beverages. Avoid the following types of food: ?Food that is high in salt (sodium). ?Food that is high in saturated fat, like full-fat dairy or red meat. ?Food that is high in trans fat, like fried food. ?Food and drinks that are high in sugar. ?Lifestyle ?Do not use any products that contain nicotine or tobacco, such as cigarettes and e-cigarettes. If you need help quitting, ask your health care provider. ?Take steps to manage and control your weight. ?Once cleared, get regular exercise. Aim for 150 minutes of moderate-intensity exercise (such as walking or yoga) or 75 minutes of vigorous exercise (such as running or swimming) each week. ?Manage other health problems, such as diabetes or high blood pressure. Ask your health care provider how you can manage these conditions. ?General instructions ?Do not drive for 24 hours after your procedure if you were given a medicine to help you relax (sedative). ?Take over-the-counter and prescription medicines only as told by your  health care provider. ?Avoid putting pressure on the area where the cardiac device was placed. ?If you need an MRI after your cardiac device has been placed, be sure to tell the health care provider who orders the MRI that you have a cardiac device. ?Avoid close and prolonged exposure to electrical devices that have strong magnetic fields. These include: ?Cell phones. Avoid keeping them in a pocket near the cardiac device, and try using the  ear opposite the cardiac device. ?MP3 players. ?Household appliances, like microwaves. ?Metal detectors. ?Electric generators. ?High-tension wires. ?Keep all follow-up visits as directed by your health care provider. This is important. ?Contact a health care provider if: ?You have pain at the incision site that is not relieved by over-the-counter or prescription medicines. ?You have any of these around your incision site or coming from it: ?More redness, swelling, or pain. ?Fluid or blood. ?Warmth to the touch. ?Pus or a bad smell. ?You have a fever. ?You feel brief, occasional palpitations, light-headedness, or any symptoms that you think might be related to your heart. ?Get help right away if: ?You experience chest pain that is different from the pain at the cardiac device site. ?You develop a red streak that extends above or below the incision site. ?You experience shortness of breath. ?You have palpitations or an irregular heartbeat. ?You have light-headedness that does not go away quickly. ?You faint or have dizzy spells. ?Your pulse suddenly drops or increases rapidly and does not return to normal. ?You begin to gain weight and your legs and ankles swell. ?Summary ?After your procedure, it is common to have pain, soreness, and some swelling where the cardiac device was inserted. ?Make sure to keep your incision clean and dry. Follow instructions from your health care provider about how to take care of your incision. ?Check your incision every day for signs of infection, such as more pain or swelling, pus or a bad smell, warmth, or leaking fluid and blood. ?Avoid strenuous exercise and lifting your left arm higher than your shoulder for 2 weeks, or as long as told by your health care provider. ?This information is not intended to replace advice given to you by your health care provider. Make sure you discuss any questions you have with your health care provider. ? ?

## 2021-08-21 NOTE — H&P (Signed)
PCP: [name], NP ?Primary Cardiologist: Dr [name] ?Primary EP: Dr [name] ?  ?Gary Williamson is a 56 y.o. male who presents today for BiV ICD generator change.  Since last being seen in our clinic, the patient reports doing very well.  Today, he denies symptoms of palpitations, chest pain, shortness of breath,  lower extremity edema, dizziness, presyncope, syncope, or ICD shocks.  The patient is otherwise without complaint today.  ?  ?    ?Past Medical History:  ?Diagnosis Date  ? 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  Overview:  Viral Idiopathic Pericarditis   ? CHF (congestive heart failure) ([place])    ? Chronic systolic heart failure ([place])    ? Colon cancer screening    ? Hematuria, microscopic 05/15/2013  ?  05/15/2013 visit with [name], MD at [facility]. Normal CT scan. Recommended annual urine for microscopy and serum creatinine x 3 years. 04/20/2013 visit with [name], MD at [facility].   ? Hypertension    ? LBBB (left bundle branch block)    ?  Chronic  ? Leg edema    ? NICM (nonischemic cardiomyopathy) ([place])    ?  Probably nonischemic; LHC in 2002/03 no sig CAD per report (done in [place]). Cardiomyopathy for 9-10 yrs. 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 Echo 5/11: severe LVE, EF <20%, diff HK, mod dias dys, sev. LAE. Hosp for CHF in 5/11. Ad. MV 6/11: EF 21%, mild per-infart ischemia. LHC (7/11): EF 15%, no CAD, LVEDP 22; Echo (10/11): EF 20-25%;  Echo (12/12):  EF 20%, mod LVH, Gr 1 DD, diff HK, mild LAE  ? OSA (obstructive sleep apnea)    ?  cpap not  used  ? Prediabetes 08/18/2020  ? Pure hypercholesterolemia    ? Severe obesity ([place])    ? Sleep apnea    ? Vitamin D deficiency    ?  ?     ?Past Surgical History:  ?Procedure Laterality Date  ? BI-VENTRICULAR IMPLANTABLE CARDIOVERTER DEFIBRILLATOR N/A 08/21/2013  ?  Procedure: BI-VENTRICULAR IMPLANTABLE CARDIOVERTER DEFIBRILLATOR  (CRT-D);  Surgeon: Coralyn Mark, MD;  Location: [facility] CATH LAB;   Service: Cardiovascular;  Laterality: N/A;  ? CARDIAC CATHETERIZATION   2002 or 2003  ? COLONOSCOPY N/A 09/16/2014  ?  Procedure: COLONOSCOPY;  Surgeon: [name], MD;  Location: [facility] ENDOSCOPY;  Service: Endoscopy;  Laterality: N/A;  ? COLONOSCOPY      ? LEFT HEART CATH   11/13/2009  ?  Dr [name]  ? POLYPECTOMY      ?  ?  ?ROS- all systems are reviewed and negative except as per HPI above ?  ?Medicines reviewed ?  ?  ?Physical Exam: ?   ?Vitals:  ?  07/09/21 0943  ?BP: (!) 146/84  ?Pulse: 86  ?SpO2: 95%  ?Weight: (!) 352 lb 3.2 oz (159.8 kg)  ?Height: '6\' 1"'$  (1.854 m)  ?  ?  ?GEN- The patient is well appearing, alert and oriented x 3 today.   ?Head- normocephalic, atraumatic ?Eyes-  Sclera clear, conjunctiva pink ?Ears- hearing intact ?Oropharynx- clear ?Lungs- Clear to ausculation bilaterally, normal work of breathing ?Chest- ICD pocket is well healed ?Heart- Regular rate and rhythm, no murmurs, rubs or gallops, PMI not laterally displaced ?GI- soft, NT, ND, + BS ?Extremities- no clubbing, cyanosis, or edema ?  ?ICD interrogation- reviewed in detail today,  See PACEART report ?  ?ekg tracing ordered today is personally reviewed and shows sinus with  Biv pacing ?  ?   ?Wt Readings from Last 3 Encounters:  ?07/09/21 (!) 352 lb 3.2 oz (159.8 kg)  ?03/24/21 (!) 347 lb (157.4 kg)  ?02/24/21 (!) 345 lb (156.5 kg)  ?  ?  ?Assessment and Plan: ?  ?1.  Chronic systolic dysfunction/ nonischemic CM ?euvolemic today ?Stable on an appropriate medical regimen ?Normal BiV ICD function but has reached ERI ?Clinically improved with CRT but EF remains 35% ?See [facility] Art report ?No changes today ?he is not device dependant today ?  ? ?ICD Criteria ? ?Current LVEF:35%. Within 12 months prior to implant: Yes  ? ?Heart failure history: Yes, Class III ? ?Cardiomyopathy history: Yes, Non-Ischemic Cardiomyopathy. ? ?Atrial Fibrillation/Atrial Flutter: Yes, Paroxysmal. ? ?Ventricular tachycardia history: No. ? ?Cardiac arrest history:  No. ? ?History of syndromes with risk of sudden death: No. ? ?Previous ICD: Yes, Reason for ICD:  Primary prevention. ? ?Current ICD indication: Primary ? ?PPM indication: yes, CRT ? ?Beta Blocker therapy for 3 or more months: Yes, prescribed.  ? ?Ace Inhibitor/ARB therapy for 3 or more months: Yes, prescribed.  ? ? ?I have seen Gary Williamson is a 56 y.o. male presents for consideration of BiV ICD generator change for primary prevention of sudden death.  The patient's chart has been reviewed and they meet criteria for BiV ICD generator change.  I have had a thorough discussion with the patient reviewing options.  The patient has had opportunities to ask questions and have them answered. The patient and I have decided together through the [place] Decision Support Tool to proceed with BiV ICD generator replacement at this time.  Risks, benefits, alternatives to ICD generator change were discussed in detail with the patient today. The patient  understands that the risks include but are not limited to bleeding, infection, pneumothorax, perforation, tamponade, vascular damage, renal failure, MI, stroke, death, inappropriate shocks, and lead dislodgement and wishes to proceed.   ? ?[name] MD, [place] ?08/21/2021 ?11:05 AM ? ?

## 2021-08-24 ENCOUNTER — Encounter (HOSPITAL_COMMUNITY): Payer: Self-pay | Admitting: Internal Medicine

## 2021-08-24 MED FILL — Lidocaine HCl Local Inj 1%: INTRAMUSCULAR | Qty: 60 | Status: AC

## 2021-08-25 ENCOUNTER — Encounter (HOSPITAL_BASED_OUTPATIENT_CLINIC_OR_DEPARTMENT_OTHER): Payer: Self-pay | Admitting: Nurse Practitioner

## 2021-08-25 ENCOUNTER — Ambulatory Visit (INDEPENDENT_AMBULATORY_CARE_PROVIDER_SITE_OTHER): Payer: BC Managed Care – PPO | Admitting: Nurse Practitioner

## 2021-08-25 VITALS — BP 117/80 | HR 61 | Temp 98.7°F | Ht 73.0 in | Wt 341.0 lb

## 2021-08-25 DIAGNOSIS — E1165 Type 2 diabetes mellitus with hyperglycemia: Secondary | ICD-10-CM | POA: Diagnosis not present

## 2021-08-25 DIAGNOSIS — G4733 Obstructive sleep apnea (adult) (pediatric): Secondary | ICD-10-CM

## 2021-08-25 DIAGNOSIS — Z95810 Presence of automatic (implantable) cardiac defibrillator: Secondary | ICD-10-CM

## 2021-08-25 DIAGNOSIS — M109 Gout, unspecified: Secondary | ICD-10-CM | POA: Insufficient documentation

## 2021-08-25 DIAGNOSIS — I4891 Unspecified atrial fibrillation: Secondary | ICD-10-CM | POA: Diagnosis not present

## 2021-08-25 DIAGNOSIS — Z23 Encounter for immunization: Secondary | ICD-10-CM

## 2021-08-25 DIAGNOSIS — Z0000 Encounter for general adult medical examination without abnormal findings: Secondary | ICD-10-CM

## 2021-08-25 DIAGNOSIS — I5022 Chronic systolic (congestive) heart failure: Secondary | ICD-10-CM

## 2021-08-25 DIAGNOSIS — E66813 Obesity, class 3: Secondary | ICD-10-CM

## 2021-08-25 DIAGNOSIS — Z6841 Body Mass Index (BMI) 40.0 and over, adult: Secondary | ICD-10-CM

## 2021-08-25 DIAGNOSIS — E782 Mixed hyperlipidemia: Secondary | ICD-10-CM

## 2021-08-25 LAB — POCT UA - MICROALBUMIN
Albumin/Creatinine Ratio, Urine, POC: <30
Creatinine, POC: 200 mg/dL
Microalbumin Ur, POC: 10 mg/L

## 2021-08-25 MED ORDER — ZOSTER VAC RECOMB ADJUVANTED 50 MCG/0.5ML IM SUSR: 0.5000 mL | Freq: Once | INTRAMUSCULAR | 1 refills | Status: AC

## 2021-08-25 NOTE — Assessment & Plan Note (Signed)
CPAP use. No alarm sx present at this time.  ?

## 2021-08-25 NOTE — Assessment & Plan Note (Signed)
Currently on eliquis. Arrhythmia present today with only a few missed beats detected on auscultation. Rate normal. Asymptomatic.  ?Continue current medications and report concerns for dizziness, CP, palpitations, ShOB, weakness, mental changes, or bleeding immediately.  ?

## 2021-08-25 NOTE — Patient Instructions (Addendum)
It was a pleasure seeing you today. I hope your time spent with [name] was pleasant and helpful. Please let us know if there is anything we can do to improve the service you receive.  ? ?Today we discussed concerns with: ? ?Annual Physical Exam with Labs ? ?We will get labs today to make sure everything looks ok. We can meet in the next couple of weeks to go over the labs- we can do this visit over the phone if you would like.  ?I have provided you with a written prescription for your Shingles vaccine. You can have this done at any pharmacy that you would like. It is a two dose series, you will need to get the second vaccine 1-3 months after the first.  ? ? ? ? ? ? ?Important Office Information ?Lab Results ?If labs were ordered, please note that you will see results through [place] as soon as they come available from [place].  ?It takes up to 5 business days for the results to be routed to me and for me to review them once all of the lab results have come through from [facility] I will make recommendations based on your results and send these through [place] or someone from the office will call you to discuss. If your labs are abnormal, we may contact you to schedule a visit to discuss the results and make recommendations.  ?If you have not heard from [name] within 5 business days or you have waited longer than a week and your lab results have not come through on [place], please feel free to call the office or send a message through [place] to follow-up on these labs.  ? ?Referrals ?If referrals were placed today, the office where the referral was sent will contact you either by phone or through [place] to set up scheduling. Please note that it can take up to a week for the referral office to contact you. If you do not hear from them in a week, please contact the referral office directly to inquire about scheduling.  ? ?Condition Treated ?If your condition worsens or you begin to have new symptoms, please schedule a  follow-up appointment for further evaluation. If you are not sure if an appointment is needed, you may call the office to leave a message for the nurse and someone will contact you with recommendations.  ?If you have an urgent or life threatening emergency, please do not call the office, but seek emergency evaluation by calling 911 or going to the nearest emergency room for evaluation.  ? ?MyChart and Phone Calls ?Please do not use MyChart for urgent messages. It may take up to 3 business days for MyChart messages to be read by staff and if they are unable to handle the request, an additional 3 business days for them to be routed to me and for my response.  ?Messages sent to the provider through [place] do not come directly to the provider, please allow time for these messages to be routed and for me to respond.  ?We get a large volume of MyChart messages daily and these are responded to in the order received.  ? ?For urgent messages, please call the office at [phone] and speak with the front office staff or leave a message on the line of my assistant for guidance.  ?We are seeing patients from the hours of 8:00 am through 5:00 pm and calls directly to the nurse may not be answered immediately due to seeing patients, but  your call will be returned as soon as possible.  ?Phone  messages received after 4:00 PM Monday through Thursday may not be returned until the following business day. Phone messages received after 11:00 AM on Friday may not be returned until Monday.  ? ?After Hours ?We share on call hours with providers from other offices. If you have an urgent need after hours that cannot wait until the next business day, please contact the on call provider by calling the office number. A nurse will speak with you and contact the provider if needed for recommendations.  ?If you have an urgent or life threatening emergency after hours, please do not call the on call provider, but seek emergency evaluation by  calling 911 or going to the nearest emergency room for evaluation.  ? ?Paperwork ?All paperwork requires a minimum of 5 days to complete and return to you or the designated personnel. Please keep this in mind when bringing in forms or sending requests for paperwork completion to the office.  ?  ?

## 2021-08-25 NOTE — Assessment & Plan Note (Signed)
BMI 44.99- stable at this time. Recommend increased activity with walking as long as approved by cardiology to help with weight management and reduction in RF for further complications. Will obtain labs today for evaluation and f/u with patient in 2-3 weeks.  ?

## 2021-08-25 NOTE — Assessment & Plan Note (Signed)
Replacement on Friday 08/21/2021. No concerns today.  ?

## 2021-08-25 NOTE — Assessment & Plan Note (Signed)
Chronic. Labs today. Currently no SE of medications. Will follow-up on labs in the next 2-3 weeks. Continue to follow low carbohydrate diet with no more than 180g of carbs a day and no more than 13g of saturated fat per day. Work on increased activity to help with management.  ?

## 2021-08-25 NOTE — Assessment & Plan Note (Signed)
Chronic. On statin therapy. Labs today for evaluation. Will f/u on labs in 2-3 weeks ?

## 2021-08-25 NOTE — Progress Notes (Signed)
? ?BP 117/80   Pulse 61   Temp 98.7 ?F (37.1 ?C)   Ht _0  (1.854 m)   Wt (!) 341 lb (154.7 kg)   SpO2 97%   BMI 44.99 kg/m?   ? ?Subjective:  ? ? Patient ID: Gary Williamson, male    DOB: January 25, 1966, 56 y.o.   MRN: [id number] ? ?HPI: ?Gary Williamson is a 56 y.o. male presenting on 08/25/2021 for comprehensive medical examination.  ? ?Current medical concerns include:none ? ?He reports regular vision exams q1-5y: yes ?He reports regular dental exams q 38m yes ?His diet consists of:  diabetic diet ?He endorses exercise and/or activity of:  no routine activity ?He works as:  HR representative ? ?He endorses ETOH use ( socially ) ?He denies nictoine use  ?He denies illegal substance use  ?He is sexually active with one partner  ?He denies concerns today about STI ? ?He denies concerns about skin changes today  ?He denies concerns about bowel changes today  ?He denies concerns about bladder changes today  ? ?Most Recent Depression Screen:  ? ?  08/25/2021  ?  8:18 AM 11/27/2020  ? 12:19 PM 08/18/2020  ?  2:14 PM 07/13/2019  ?  9:58 AM 05/07/2019  ?  5:06 PM  ?Depression screen PHQ 2/9  ?Decreased Interest 0 0 0 0 0  ?Down, Depressed, Hopeless 0 0 0 0 0  ?PHQ - 2 Score 0 0 0 0 0  ?Altered sleeping   0    ?Tired, decreased energy   0    ?Change in appetite   0    ?Feeling bad or failure about yourself    0    ?Trouble concentrating   0    ?Moving slowly or fidgety/restless   0    ?Suicidal thoughts   0    ?PHQ-9 Score   0    ? ?Most Recent Anxiety Screen: ? ?  08/18/2020  ?  2:15 PM  ?GAD 7 : Generalized Anxiety Score  ?Nervous, Anxious, on Edge 0  ?Control/stop worrying 0  ?Worry too much - different things 0  ?Trouble relaxing 0  ?Restless 0  ?Easily annoyed or irritable 0  ?Afraid - awful might happen 0  ?Total GAD 7 Score 0  ? ?Most Recent Falls Screen: ? ?  08/25/2021  ?  8:18 AM 11/27/2020  ? 12:19 PM 08/18/2020  ?  2:14 PM 07/13/2019  ?  9:58 AM 05/07/2019  ?  5:06 PM  ?Fall Risk   ?Falls in the past year? 0 0 0 0 0   ?Number falls in past yr: 0 0 0 0 0  ?Injury with Fall? 0 0  0 0  ?Risk for fall due to : No Fall Risks No Fall Risks No Fall Risks    ?Follow up Education provided;Falls evaluation completed Falls evaluation completed  Falls evaluation completed Falls evaluation completed  ? ? ?Past medical history, surgical history, medications, allergies, family history and social history reviewed with patient today and changes made to appropriate areas of the chart.  ?Past Medical History:  ?Past Medical History:  ?Diagnosis Date  ? 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 Overview:  Viral Idiopathic Pericarditis   ? Body mass index (BMI) of 45.0-49.9 in adult ([facility] 08/18/2020  ? CH[place]ive heart failure) [place]   ? Chronic systolic heart failure ([place]   ? Colon cancer screening   ? Encounter to establish care 08/18/2020  ? Hematuria, microscopic 05/15/2013  ? 05/15/2013  visit with [name], MD at [facility]. Normal CT scan. Recommended annual urine for microscopy and serum creatinine x 3 years. 04/20/2013 visit with [name], MD at [facility].   ? Hypertension   ? LBBB (left bundle branch block)   ? Chronic  ? Leg edema   ? NICM (nonischemic cardiomyopathy) ([place])   ? Probably nonischemic; LHC in 2002/03 no sig CAD per report (done in [place]). Cardiomyopathy for 9-10 yrs. 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 Echo 5/11: severe LVE, EF <20%, diff HK, mod dias dys, sev. LAE. Hosp for CHF in 5/11. Ad. MV 6/11: EF 21%, mild per-infart ischemia. LHC (7/11): EF 15%, no CAD, LVEDP 22; Echo (10/11): EF 20-25%;  Echo (12/12):  EF 20%, mod LVH, Gr 1 DD, diff HK, mild LAE  ? OSA (obstructive sleep apnea)   ? cpap not  used  ? Prediabetes 08/18/2020  ? Pure hypercholesterolemia   ? Severe obesity ([place])   ? Sleep apnea   ? Vitamin D deficiency   ? ?Medications:  ?Current Outpatient Medications on File Prior to Visit  ?Medication Sig  ? apixaban (ELIQUIS) 5 MG TABS tablet Take 1 tablet (5 mg  total) by mouth 2 (two) times daily.  ? blood glucose meter kit and supplies Dispense based on patient and insurance preference. Check blood sugar every morning before eating and up to three additional times a day as needed (FOR ICD-10 E10.9, E11.9).  ? Cholecalciferol (VITAMIN D) 2000 units CAPS Take 2,000 Units by mouth daily.  ? FARXIGA 10 MG TABS tablet TAKE 1 TABLET BY MOUTH ONCE DAILY   ? fish oil-omega-3 fatty acids 1000 MG capsule Take 1 capsule (1 g total) by mouth daily.  ? Lancets (ONETOUCH DELICA PLUS [id number]) MISC USE 1 LANCET TO CHECK GLUCOSE EVERY MORNING BEFORE EATING AND UP TO 3 ADDITIONAL TIMES PER DAY AS NEEDED  ? metFORMIN (GLUCOPHAGE) 500 MG tablet Take 2 tabs (1040m) by mouth with breakfast and 2 tabs (10042m by mouth with dinner daily. Full dose of 200013mer day.  ? metoprolol succinate (TOPROL-XL) 100 MG 24 hr tablet Take 1 tablet (100 mg total) by mouth daily. Take with or immediately following a meal.  ? Multiple Vitamin (MULTIVITAMIN) tablet Take 1 tablet by mouth daily.  ? ONETOUCH VERIO test strip USE 1 STRIP TO CHECK GLUCOSE IN THE MORNING BEFORE  EATING  AND  UP  TO  3  ADDITIONAL  TIMES  PER  DAY  AS  NEEDED  ? potassium chloride SA (KLOR-CON) 20 MEQ tablet Take 1 tablet (20 mEq total) by mouth daily.  ? rosuvastatin (CRESTOR) 20 MG tablet Take 1 tablet (20 mg total) by mouth daily.  ? sacubitril-valsartan (ENTRESTO) 97-103 MG Take 1 tablet by mouth 2 (two) times daily. Please call to schedule an appointment for future refills. 1st attempt  ? spironolactone (ALDACTONE) 25 MG tablet Take 1 tablet (25 mg total) by mouth daily.  ? torsemide (DEMADEX) 20 MG tablet Take 2 tablets (40 mg total) by mouth daily.  ? ?No current facility-administered medications on file prior to visit.  ? ?Surgical History:  ?Past Surgical History:  ?Procedure Laterality Date  ? BI-VENTRICULAR IMPLANTABLE CARDIOVERTER DEFIBRILLATOR N/A 08/21/2013  ? Procedure: BI-VENTRICULAR IMPLANTABLE CARDIOVERTER  DEFIBRILLATOR  (CRT-D);  Surgeon: JamCoralyn MarkD;  [facility];  Service: Cardiovascular;  Laterality: N/A;  ? BIV ICD GENERATOR CHANGEOUT N/A 08/21/2021  ? Procedure: BIV ICD GENERATOR CHANGEOUT;  Surgeon: AllRayann Heman  Jeneen Rinks, MD;  Location: [place] CV LAB;  Service: Cardiovascular;  Laterality: N/A;  ? CARDIAC CATHETERIZATION  2002 or 2003  ? COLONOSCOPY N/A 09/16/2014  ? Procedure: COLONOSCOPY;  Surgeon: [name], MD;  Location: [facility];  Service: Endoscopy;  Laterality: N/A;  ? COLONOSCOPY    ? LEFT HEART CATH  11/13/2009  ? Dr [name]  ? POLYPECTOMY    ? ?Allergies:  ?No Known Allergies ?Social History:  ?Social History  ? ?Socioeconomic History  ? Marital status: Married  ?  Spouse name: [name]  ? Number of children: 2  ? Years of education: Not on file  ? Highest education level: Not on file  ?Occupational History  ? Occupation: Engineer, materials for [employer]  ?  Employer: OTHER  ?  Comment: Full-time  ?  Employer: [employer]  ?Tobacco Use  ? Smoking status: Never  ? Smokeless tobacco: Never  ?Substance and Sexual Activity  ? Alcohol use: Yes  ?  Alcohol/week: 0.0 - 2.0 standard drinks  ?  Comment: rare to occ.  ? Drug use: No  ? Sexual activity: Yes  ?  Birth control/protection: Condom  ?Other Topics Concern  ? Not on file  ?Social History Narrative  ? Marital status: married x 17 years; happily married  ?    Children: 2 children (15,12)  ?    Lives in [place] with his wife and 2 children  ?    Employment: [employer]; HR Manager x 12 years; likes work.  Lots of traveling.   ?    Tobacco: none  ?    Alcohol:  1 drink per week  ?    Exercise:  Walking three days per week; 20 minutes.  ?     Seatbelt: 100%  ? ?Social Determinants of Health  ? ?Financial Resource Strain: Not on file  ?Food Insecurity: Not on file  ?Transportation Needs: Not on file  ?Physical Activity: Not on file  ?Stress: Not on file  ?Social Connections: Not on file  ?Intimate Partner Violence: Not on file  ? ?Social  History  ? ?Tobacco Use  ?Smoking Status Never  ?Smokeless Tobacco Never  ? ?Social History  ? ?Substance and Sexual Activity  ?Alcohol Use Yes  ? Alcohol/week: 0.0 - 2.0 standard drinks  ? Comment: rare to occ.  ? ?Family

## 2021-08-25 NOTE — Assessment & Plan Note (Signed)
Chronic. Doing well at this time. Recent replacement of defibrillator (last Friday). Doing well with this. He has had one shock prior to battery dying, but nothing since that time. No ShOB, CP, edema. Will continue to monitor.   ?

## 2021-08-26 LAB — COMPREHENSIVE METABOLIC PANEL
ALT: 14 IU/L (ref 0–44)
AST: 18 IU/L (ref 0–40)
Albumin/Globulin Ratio: 1.7 (ref 1.2–2.2)
Albumin: 4.6 g/dL (ref 3.8–4.9)
Alkaline Phosphatase: 85 IU/L (ref 44–121)
BUN/Creatinine Ratio: 12 (ref 9–20)
BUN: 15 mg/dL (ref 6–24)
Bilirubin Total: 0.4 mg/dL (ref 0.0–1.2)
CO2: 20 mmol/L (ref 20–29)
Calcium: 9.6 mg/dL (ref 8.7–10.2)
Chloride: 105 mmol/L (ref 96–106)
Creatinine, Ser: 1.26 mg/dL (ref 0.76–1.27)
Globulin, Total: 2.7 g/dL (ref 1.5–4.5)
Glucose: 127 mg/dL — ABNORMAL HIGH (ref 70–99)
Potassium: 3.6 mmol/L (ref 3.5–5.2)
Sodium: 146 mmol/L — ABNORMAL HIGH (ref 134–144)
Total Protein: 7.3 g/dL (ref 6.0–8.5)
eGFR: 67 mL/min/{1.73_m2} (ref >59)

## 2021-08-26 LAB — CBC WITH DIFFERENTIAL/PLATELET
Basophils Absolute: 0.1 10*3/uL (ref 0.0–0.2)
Basos: 1 %
EOS (ABSOLUTE): 0.2 10*3/uL (ref 0.0–0.4)
Eos: 2 %
Hematocrit: 45.3 % (ref 37.5–51.0)
Hemoglobin: 15.7 g/dL (ref 13.0–17.7)
Immature Grans (Abs): 0 10*3/uL (ref 0.0–0.1)
Immature Granulocytes: 0 %
Lymphocytes Absolute: 2 10*3/uL (ref 0.7–3.1)
Lymphs: 19 %
MCH: 29.5 pg (ref 26.6–33.0)
MCHC: 34.7 g/dL (ref 31.5–35.7)
MCV: 85 fL (ref 79–97)
Monocytes Absolute: 0.8 10*3/uL (ref 0.1–0.9)
Monocytes: 7 %
Neutrophils Absolute: 7.3 10*3/uL — ABNORMAL HIGH (ref 1.4–7.0)
Neutrophils: 71 %
Platelets: 300 10*3/uL (ref 150–450)
RBC: 5.33 x10E6/uL (ref 4.14–5.80)
RDW: 13.7 % (ref 11.6–15.4)
WBC: 10.3 10*3/uL (ref 3.4–10.8)

## 2021-08-26 LAB — HEMOGLOBIN A1C
Est. average glucose Bld gHb Est-mCnc: 126 mg/dL
Hgb A1c MFr Bld: 6 % — ABNORMAL HIGH (ref 4.8–5.6)

## 2021-08-26 LAB — LIPID PANEL
Chol/HDL Ratio: 5.3 ratio — ABNORMAL HIGH (ref 0.0–5.0)
Cholesterol, Total: 175 mg/dL (ref 100–199)
HDL: 33 mg/dL — ABNORMAL LOW (ref >39)
LDL Chol Calc (NIH): 114 mg/dL — ABNORMAL HIGH (ref 0–99)
Triglycerides: 156 mg/dL — ABNORMAL HIGH (ref 0–149)
VLDL Cholesterol Cal: 28 mg/dL (ref 5–40)

## 2021-09-02 ENCOUNTER — Ambulatory Visit (INDEPENDENT_AMBULATORY_CARE_PROVIDER_SITE_OTHER): Payer: BC Managed Care – PPO

## 2021-09-02 ENCOUNTER — Other Ambulatory Visit (HOSPITAL_BASED_OUTPATIENT_CLINIC_OR_DEPARTMENT_OTHER): Payer: Self-pay | Admitting: Nurse Practitioner

## 2021-09-02 ENCOUNTER — Ambulatory Visit: Payer: BC Managed Care – PPO

## 2021-09-02 DIAGNOSIS — E782 Mixed hyperlipidemia: Secondary | ICD-10-CM

## 2021-09-02 DIAGNOSIS — I10 Essential (primary) hypertension: Secondary | ICD-10-CM

## 2021-09-02 DIAGNOSIS — I428 Other cardiomyopathies: Secondary | ICD-10-CM

## 2021-09-02 DIAGNOSIS — E1165 Type 2 diabetes mellitus with hyperglycemia: Secondary | ICD-10-CM

## 2021-09-02 LAB — CUP PACEART INCLINIC DEVICE CHECK
Battery Remaining Longevity: 88 mo
Brady Statistic RA Percent Paced: 3.7 %
Brady Statistic RV Percent Paced: 95 %
Date Time Interrogation Session: 20230503084849
HighPow Impedance: 46.125
Implantable Lead Implant Date: 20150421
Implantable Lead Implant Date: 20150421
Implantable Lead Implant Date: 20150421
Implantable Lead Location: 753857
Implantable Lead Location: 753859
Implantable Lead Location: 753860
Implantable Pulse Generator Implant Date: 20230421
Lead Channel Impedance Value: 337.5 Ohm
Lead Channel Impedance Value: 425 Ohm
Lead Channel Impedance Value: 700 Ohm
Lead Channel Pacing Threshold Amplitude: 0.75 V
Lead Channel Pacing Threshold Amplitude: 0.75 V
Lead Channel Pacing Threshold Amplitude: 0.75 V
Lead Channel Pacing Threshold Amplitude: 0.75 V
Lead Channel Pacing Threshold Amplitude: 1 V
Lead Channel Pacing Threshold Amplitude: 1 V
Lead Channel Pacing Threshold Pulse Width: 0.5 ms
Lead Channel Pacing Threshold Pulse Width: 0.5 ms
Lead Channel Pacing Threshold Pulse Width: 0.5 ms
Lead Channel Pacing Threshold Pulse Width: 0.5 ms
Lead Channel Pacing Threshold Pulse Width: 0.5 ms
Lead Channel Pacing Threshold Pulse Width: 0.5 ms
Lead Channel Sensing Intrinsic Amplitude: 12 mV
Lead Channel Sensing Intrinsic Amplitude: 4.2 mV
Lead Channel Setting Pacing Amplitude: 2 V
Lead Channel Setting Pacing Amplitude: 2 V
Lead Channel Setting Pacing Amplitude: 2 V
Lead Channel Setting Pacing Pulse Width: 0.5 ms
Lead Channel Setting Pacing Pulse Width: 0.5 ms
Lead Channel Setting Sensing Sensitivity: 0.5 mV
Pulse Gen Serial Number: 210000231

## 2021-09-02 MED ORDER — ROSUVASTATIN CALCIUM 20 MG PO TABS: ORAL_TABLET | ORAL | 3 refills | Status: DC

## 2021-09-02 NOTE — Patient Instructions (Signed)
   After Your ICD (Implantable Cardiac Defibrillator)    Monitor your defibrillator site for redness, swelling, and drainage. Call the device clinic at [phone] if you experience these symptoms or fever/chills.  Your incision was closed with Steri-strips or staples:  You may shower 7 days after your procedure and wash your incision with soap and water. Avoid lotions, ointments, or perfumes over your incision until it is well-healed.  You may use a hot tub or a pool after your wound check appointment if the incision is completely closed.   Your ICD is designed to protect you from life threatening heart rhythms. Because of this, you may receive a shock.   1 shock with no symptoms:  Call the office during business hours. 1 shock with symptoms (chest pain, chest pressure, dizziness, lightheadedness, shortness of breath, overall feeling unwell):  Call 911. If you experience 2 or more shocks in 24 hours:  Call 911. If you receive a shock, you should not drive.  [place] DMV - no driving for 6 months if you receive appropriate therapy from your ICD.   ICD Alerts:  Some alerts are vibratory and others beep. These are NOT emergencies. Please call our office to let us know. If this occurs at night or on weekends, it can wait until the next business day. Send a remote transmission.  If your device is capable of reading fluid status (for heart failure), you will be offered monthly monitoring to review this with you.   Remote monitoring is used to monitor your ICD from home. This monitoring is scheduled every 91 days by our office. It allows us to keep an eye on the functioning of your device to ensure it is working properly. You will routinely see your Electrophysiologist annually (more often if necessary).  

## 2021-09-02 NOTE — Progress Notes (Signed)
Wound check appointment. Steri-strips removed. Wound without redness or edema. Incision edges approximated, wound well healed. Normal device function. Thresholds, sensing, and impedances consistent with implant measurements. Device programmed at chronic output, no new leads implanted. Histogram distribution appropriate for patient and level of activity. AT/AF burden <1%, 6 seconds in duration. Patient educated about wound care, arm mobility, shock plan. ROV in 3 months with implanting physician. ?

## 2021-09-07 NOTE — Telephone Encounter (Signed)
NA

## 2021-09-14 ENCOUNTER — Telehealth: Payer: Self-pay

## 2021-09-14 NOTE — Telephone Encounter (Signed)
Discussed with Dr. Curt Bears. ? ?Appears to be afib with RVR. ? ?No driving restrictions.  Advised to take medications. ? ?Pt aware. ?

## 2021-09-14 NOTE — Telephone Encounter (Signed)
Outreach made to Pt.  Per Pt he was traveling for work and was in [place] at airport when episode happened. ?He felt the shock, but denies any symptoms during this time. ?He states he forgot his medications for this trip, and was off all of his medications for a week.  He is now home and back on his routine medications.  ? ?Advised would have MD review and return call to Pt. ? ?[place]ott alert for sustained VT with successful therapy ?Events occurred 5/12 @ 13:10, EGM shows AF with onset of sustained VT, rate 260 falling in to the VF zone, ATP x1 slowing to AF/VS ?Second event 5/12 @ 13:11, EGM shows AF with sustained VT, rate 250, unsuccessful ATP delivered x1, HV therapy 36J converting to SR ?AMS episodes for AF, longest duration 3hrs, poor rate control, burden <1%, Eliquis ? ?Presenting rhythm for review: ? ? ? ? ? ? ? ? ? ? ?

## 2021-09-15 ENCOUNTER — Ambulatory Visit (INDEPENDENT_AMBULATORY_CARE_PROVIDER_SITE_OTHER): Payer: BC Managed Care – PPO | Admitting: Nurse Practitioner

## 2021-09-15 ENCOUNTER — Encounter (HOSPITAL_BASED_OUTPATIENT_CLINIC_OR_DEPARTMENT_OTHER): Payer: Self-pay | Admitting: Nurse Practitioner

## 2021-09-15 DIAGNOSIS — Z6841 Body Mass Index (BMI) 40.0 and over, adult: Secondary | ICD-10-CM

## 2021-09-15 DIAGNOSIS — I5022 Chronic systolic (congestive) heart failure: Secondary | ICD-10-CM | POA: Diagnosis not present

## 2021-09-15 DIAGNOSIS — I4891 Unspecified atrial fibrillation: Secondary | ICD-10-CM

## 2021-09-15 DIAGNOSIS — E782 Mixed hyperlipidemia: Secondary | ICD-10-CM

## 2021-09-15 DIAGNOSIS — E1165 Type 2 diabetes mellitus with hyperglycemia: Secondary | ICD-10-CM

## 2021-09-15 NOTE — Progress Notes (Signed)
Virtual Visit Encounter telephone visit. ? ? ?I connected with  Gary Williamson on 09/15/21 at 10:10 AM EDT by secure audio telemedicine application. I verified that I am speaking with the correct person using two identifiers. ?  ?I introduced myself as a Designer, jewellery with the practice. The limitations of evaluation and management by telemedicine discussed with the patient and the availability of in person appointments. The patient expressed verbal understanding and consent to proceed. ? ?Participating parties in this visit include: Myself and patient ? ?The patient is: Patient Location: Other:  Work ?I am: Provider Location: Office/Clinic ?Subjective:   ? ?CC and HPI: Gary Williamson is a 56 y.o. year old male presenting for follow up of labs. ?Discussion today with recent lab results.  ?He has questions today about Optavia meal options for weight loss.  ? ?Past medical history, Surgical history, Family history not pertinant except as noted below, Social history, Allergies, and medications have been entered into the medical record, reviewed, and corrections made.  ? ?Review of Systems:  ?All review of systems negative except what is listed in the HPI ? ?Objective:   ? ?Alert and oriented x 4 ?Speaking in clear sentences with no shortness of breath. ?No distress. ? ?Impression and Recommendations:   ? ?Problem List Items Addressed This Visit   ? ? Chronic systolic heart failure ([place])  ? Mixed hypercholesterolemia and hypertriglyceridemia  ? Class 3 severe obesity with serious comorbidity and body mass index (BMI) of 40.0 to 44.9 in adult [facility])  ? Type 2 diabetes mellitus with hyperglycemia, without long-term current use of insulin (HCC) - Primary  ? Atrial fibrillation ([place])  ?Recent labs discussed with patient. Slight elevation in sodium levels and lipids. Encouraged patient to monitor sodium and fat content in foods and opt for lower sodium options and foods low in saturated fats.  ?Recommendations include  avoidance of fast foods and highly processed foods as these tend to have higher fat and sodium content.  ?Switch out beef for leaner options including [place] and chicken.  ?Opt for olive oil, avocado oil, sunflower oil over oils that are solid or gelatinous at room temperature.  ?Plan to increase rosuvastatin to '40mg'$  dose three days a week and stay on '20mg'$  dose 4 days a week.  ?Discussion about Optavia dietary plan with patient today. Review of nutrition facts with patient based on product website.  ?Encouraged patient to monitor vitamin and nutrient intake closely if he chooses to utilize this diet. Appears to have low sodium (<2300 grams) plans, which will be beneficial. Encouraged patient to follow his intake closely and monitor to ensure that his calorie count is not too low to prevent muscle wasting. He is in agreement to this. Will monitor.  ? ?orders and follow up as documented in EMR ?I discussed the assessment and treatment plan with the patient. The patient was provided an opportunity to ask questions and all were answered. The patient agreed with the plan and demonstrated an understanding of the instructions. ?  ?The patient was advised to call back or seek an in-person evaluation if the symptoms worsen or if the condition fails to improve as anticipated. ? ?Follow-Up: in 6 months ? ?I provided 24 minutes of non-face-to-face interaction with this non face-to-face encounter including intake, same-day documentation, and chart review.  ? ?[name], NP , DNP, AGNP-c ?[facility] ?Primary Care & Sports Medicine at [facility] ?[phone] ?[phone] (fax) ? ?

## 2021-10-05 ENCOUNTER — Ambulatory Visit (INDEPENDENT_AMBULATORY_CARE_PROVIDER_SITE_OTHER): Payer: BC Managed Care – PPO

## 2021-10-05 DIAGNOSIS — I5022 Chronic systolic (congestive) heart failure: Secondary | ICD-10-CM

## 2021-10-05 DIAGNOSIS — Z95810 Presence of automatic (implantable) cardiac defibrillator: Secondary | ICD-10-CM

## 2021-10-06 ENCOUNTER — Telehealth: Payer: Self-pay

## 2021-10-06 NOTE — Telephone Encounter (Signed)
Remote ICM transmission received.  Attempted call to patient regarding ICM remote transmission and left detailed message per DPR.  Advised to return call for any fluid symptoms or questions. Next ICM remote transmission scheduled 11/09/2021.

## 2021-10-06 NOTE — Progress Notes (Signed)
EPIC Encounter for ICM Monitoring  Patient Name: Gary Williamson is a 56 y.o. male Date: 10/06/2021 Primary Care Physican: [name], NP Primary Cardiologist: [name] Electrophysiologist: [name] Bi-V Pacing:  89%     08/25/2021 Office Weight: 341 lbs   AT/AF Burden: <1% (taking Eliquis)   Attempted call to patient and unable to reach.  Left detailed message per DPR regarding transmission. Transmission reviewed.    [place] Thoracic impedance suggesting continued pattern of days with possible fluid accumulation days followed by return to normal or possible dryness.     Prescribed: Torsemide 20 mg take 2 tablets (40 mg total) daily. Potassium 20 mEq take 1 tablet daily Spironolactone 25 mg take 1 tablet daily   Labs: 08/25/2021 Creatinine 1.26, BUN 15, Potassium 3.6, Sodium 146, GFR 67 08/12/2021 Creatinine 1.26, BUN 19, Potassium 3.6, Sodium 145, GFR 67 A complete set of results can be found in Results Review.   Recommendations:  Left voice mail with ICM number and encouraged to call if experiencing any fluid symptoms.   Follow-up plan: ICM clinic phone appointment on 11/09/2021.   91 day device clinic remote transmission 11/05/2020.     EP/Cardiology Office Visits:   12/18/2021 with Dr [name].    Copy of ICM check sent to Dr. [name].    3 month ICM trend: 10/05/2021.    12-14 Month ICM trend:     [name], RN 10/06/2021 2:31 PM

## 2021-11-04 ENCOUNTER — Other Ambulatory Visit (HOSPITAL_COMMUNITY): Payer: Self-pay | Admitting: Cardiology

## 2021-11-04 DIAGNOSIS — I10 Essential (primary) hypertension: Secondary | ICD-10-CM

## 2021-11-04 DIAGNOSIS — I5022 Chronic systolic (congestive) heart failure: Secondary | ICD-10-CM

## 2021-11-04 DIAGNOSIS — I428 Other cardiomyopathies: Secondary | ICD-10-CM

## 2021-11-05 ENCOUNTER — Other Ambulatory Visit (HOSPITAL_COMMUNITY): Payer: Self-pay | Admitting: *Deleted

## 2021-11-09 ENCOUNTER — Ambulatory Visit (INDEPENDENT_AMBULATORY_CARE_PROVIDER_SITE_OTHER): Payer: BC Managed Care – PPO

## 2021-11-09 ENCOUNTER — Telehealth: Payer: Self-pay

## 2021-11-09 DIAGNOSIS — Z95810 Presence of automatic (implantable) cardiac defibrillator: Secondary | ICD-10-CM | POA: Diagnosis not present

## 2021-11-09 DIAGNOSIS — I5022 Chronic systolic (congestive) heart failure: Secondary | ICD-10-CM | POA: Diagnosis not present

## 2021-11-09 NOTE — Telephone Encounter (Signed)
Alert for ATP, HV therapy Events occurred 7/8 between 10:24-10:31 EGM's show AF with RVR vs sustained VT ATP delivered x2 followed by HV therapy 36J converting to AR/VS ~ 140 bpm Above followed by NSVT/AF with RVR rate 240, third event HR 250 ATPx1 slowing to AR/VS ~ 170 bpm AF burden <1%, Eliquis  Spoke with patient, patient stated he didn't take his mediations until 10:30 on Saturday and was in a hot gym watching his son play basketball, patient agreeable to apt with Dr. [name] on 11/11/21 at 8:45 to discuss episode

## 2021-11-11 ENCOUNTER — Ambulatory Visit (INDEPENDENT_AMBULATORY_CARE_PROVIDER_SITE_OTHER): Payer: BC Managed Care – PPO | Admitting: Internal Medicine

## 2021-11-11 ENCOUNTER — Encounter: Payer: Self-pay | Admitting: Internal Medicine

## 2021-11-11 VITALS — BP 110/70 | HR 102 | Ht 73.0 in | Wt 327.0 lb

## 2021-11-11 DIAGNOSIS — Z95810 Presence of automatic (implantable) cardiac defibrillator: Secondary | ICD-10-CM | POA: Diagnosis not present

## 2021-11-11 DIAGNOSIS — D6869 Other thrombophilia: Secondary | ICD-10-CM

## 2021-11-11 DIAGNOSIS — I5022 Chronic systolic (congestive) heart failure: Secondary | ICD-10-CM | POA: Diagnosis not present

## 2021-11-11 DIAGNOSIS — I480 Paroxysmal atrial fibrillation: Secondary | ICD-10-CM

## 2021-11-11 DIAGNOSIS — I428 Other cardiomyopathies: Secondary | ICD-10-CM

## 2021-11-11 MED ORDER — METOPROLOL SUCCINATE ER 50 MG PO TB24: ORAL_TABLET | ORAL | 3 refills | Status: DC

## 2021-11-11 NOTE — Progress Notes (Signed)
PCP: [name], NP Primary Cardiologist: Dr [name] Primary EP: Dr Charlean Sanfilippo is a 56 y.o. male who presents today for routine electrophysiology followup.  Since his generator change 4/23, the patient reports doing very well.  He did have a shock for afib 11/07/21.  He had not yet taken his medicines.  He was watching his son play basketball in a hot gym.  He was mostly asymptomatic.  Today, he denies symptoms of palpitations, chest pain, shortness of breath,  lower extremity edema, dizziness, presyncope, syncope, or ICD shocks.  The patient is otherwise without complaint today.   Past Medical History:  Diagnosis Date   Acute idiopathic pericarditis 03/24/2010   Overview:  Viral Idiopathic Pericarditis    Body mass index (BMI) of 45.0-49.9 in adult [facility]) 08/18/2020   CHF (congestive heart failure) ([place])    Chronic systolic heart failure ([place])    Colon cancer screening    Encounter to establish care 08/18/2020   Hematuria, microscopic 05/15/2013   05/15/2013 visit with [name], MD at [facility]. Normal CT scan. Recommended annual urine for microscopy and serum creatinine x 3 years. 04/20/2013 visit with [name], MD at [facility].    Hypertension    LBBB (left bundle branch block)    Chronic   Leg edema    NICM (nonischemic cardiomyopathy) ([place])    Probably nonischemic; LHC in 2002/03 no sig CAD per report (done in [place]). Cardiomyopathy for 9-10 yrs. 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 Echo 5/11: severe LVE, EF <20%, diff HK, mod dias dys, sev. LAE. Hosp for CHF in 5/11. Ad. MV 6/11: EF 21%, mild per-infart ischemia. LHC (7/11): EF 15%, no CAD, LVEDP 22; Echo (10/11): EF 20-25%;  Echo (12/12):  EF 20%, mod LVH, Gr 1 DD, diff HK, mild LAE   OSA (obstructive sleep apnea)    cpap not  used   Prediabetes 08/18/2020   Pure hypercholesterolemia    Severe obesity ([place])    Sleep apnea    Vitamin D deficiency    Past Surgical History:   Procedure Laterality Date   BI-VENTRICULAR IMPLANTABLE CARDIOVERTER DEFIBRILLATOR N/A 08/21/2013   Procedure: BI-VENTRICULAR IMPLANTABLE CARDIOVERTER DEFIBRILLATOR  (CRT-D);  Surgeon: Coralyn Mark, MD;  Location: [facility] CATH LAB;  Service: Cardiovascular;  Laterality: N/A;   BIV ICD GENERATOR CHANGEOUT N/A 08/21/2021   Procedure: BIV ICD GENERATOR CHANGEOUT;  Surgeon: [name], MD;  Location: [place] CV LAB;  Service: Cardiovascular;  Laterality: N/A;   CARDIAC CATHETERIZATION  2002 or 2003   COLONOSCOPY N/A 09/16/2014   Procedure: COLONOSCOPY;  Surgeon: [name], MD;  Location: [facility];  Service: Endoscopy;  Laterality: N/A;   COLONOSCOPY     LEFT HEART CATH  11/13/2009   Dr [name]   POLYPECTOMY      ROS- all systems are reviewed and negative except as per HPI above  Medicines reviewed  Physical Exam: Vitals:   11/11/21 0853  BP: 110/70  Pulse: (!) 102  SpO2: 97%  Weight: (!) 327 lb (148.3 kg)  Height: '6\' 1"'$  (1.854 m)    GEN- The patient is well appearing, alert and oriented x 3 today.   Head- normocephalic, atraumatic Eyes-  Sclera clear, conjunctiva pink Ears- hearing intact Oropharynx- clear Lungs- Clear to ausculation bilaterally, normal work of breathing Chest- ICD pocket is well healed Heart- Regular rate and rhythm, no murmurs, rubs or gallops, PMI not laterally displaced GI- soft, NT, ND, + BS  Extremities- no clubbing, cyanosis, or edema  ICD interrogation- reviewed in detail today,  See PACEART report  ekg tracing ordered today is personally reviewed and shows sinus tachycardia, biv paced  Wt Readings from Last 3 Encounters:  11/11/21 (!) 327 lb (148.3 kg)  08/25/21 (!) 341 lb (154.7 kg)  08/21/21 (!) 345 lb (156.5 kg)    Assessment and Plan:  1.  Chronic systolic dysfunction/ nonischemic euvolemic today Echo reviewed Stable on an appropriate medical regimen Normal ICD function See [facility] report No changes today he is not device  dependant today followed in ICM device clinic  2. Afib with RVR recently received an ICD shock for AF with RVR (personally reviewed) Burden is <1% Compliance with medicines and lifestyle modification advised Increase toprol to '100mg'$  qam, '50mg'$  qpm He is on eliquis The patient has symptomatic, recurrent  atrial fibrillation.  Therapeutic strategies for afib including medicine (tikosyn, sotalol, amiodarone) and ablation were discussed in detail with the patient today. Risk, benefits, and alternatives to each approach were discussed.  He wishes to try adding pm dose of toprol and see how he does.  3. HTN Stable No change required today  4. Morbid obesity Body mass index is 43.14 kg/m. Lifestyle modification again advised He is making great progress!  Risks, benefits and potential toxicities for medications prescribed and/or refilled reviewed with patient today.   Follow-up with EP APP in 3 months for further assessment of his arrhythmia.  [name] MD, [facility] 11/11/2021 8:55 AM

## 2021-11-11 NOTE — Patient Instructions (Addendum)
Medication Instructions:  Your physician has recommended you make the following change in your medication:  Start taking: Take two tablets by mouth in the morning (100 mg Toprol) and one tablet by mouth at night ( 50 mg Toprol )  Lab Work:  You will have lab work drawn today; BMET, CBC, Mg, and TSH  Testing/Procedures: None ordered.   Next remote check BiV ICD 11/23/2021  Follow-Up: At [facility], you and your health needs are our priority.  As part of our continuing mission to provide you with exceptional heart care, we have created designated Provider Care Teams.  These Care Teams include your primary Cardiologist (physician) and Advanced Practice Providers (APPs -  Physician Assistants and Nurse Practitioners) who all work together to provide you with the care you need, when you need it.  Your next appointment:   Your physician wants you to follow-up in: 3 month follow up You may see Dr. [name] or one of the following Advanced Practice Providers on your designated Care Team:   Tommye Standard, [place] Legrand [place] "Jonni Sanger" [place], [place] You will receive a reminder letter in the mail two months in advance. If you don't receive a letter, please call our office to schedule the follow-up appointment.   Important Information About Sugar

## 2021-11-12 LAB — TSH: TSH: 2.86 u[IU]/mL (ref 0.450–4.500)

## 2021-11-12 LAB — CBC WITH DIFFERENTIAL/PLATELET
Basophils Absolute: 0.1 10*3/uL (ref 0.0–0.2)
Basos: 1 %
EOS (ABSOLUTE): 0.2 10*3/uL (ref 0.0–0.4)
Eos: 1 %
Hematocrit: 43.7 % (ref 37.5–51.0)
Hemoglobin: 15.1 g/dL (ref 13.0–17.7)
Immature Grans (Abs): 0 10*3/uL (ref 0.0–0.1)
Immature Granulocytes: 0 %
Lymphocytes Absolute: 1.9 10*3/uL (ref 0.7–3.1)
Lymphs: 18 %
MCH: 29.5 pg (ref 26.6–33.0)
MCHC: 34.6 g/dL (ref 31.5–35.7)
MCV: 85 fL (ref 79–97)
Monocytes Absolute: 0.8 10*3/uL (ref 0.1–0.9)
Monocytes: 7 %
Neutrophils Absolute: 7.7 10*3/uL — ABNORMAL HIGH (ref 1.4–7.0)
Neutrophils: 73 %
Platelets: 298 10*3/uL (ref 150–450)
RBC: 5.12 x10E6/uL (ref 4.14–5.80)
RDW: 14.1 % (ref 11.6–15.4)
WBC: 10.6 10*3/uL (ref 3.4–10.8)

## 2021-11-12 LAB — BASIC METABOLIC PANEL
BUN/Creatinine Ratio: 15 (ref 9–20)
BUN: 19 mg/dL (ref 6–24)
CO2: 23 mmol/L (ref 20–29)
Calcium: 9.4 mg/dL (ref 8.7–10.2)
Chloride: 100 mmol/L (ref 96–106)
Creatinine, Ser: 1.29 mg/dL — ABNORMAL HIGH (ref 0.76–1.27)
Glucose: 142 mg/dL — ABNORMAL HIGH (ref 70–99)
Potassium: 3.4 mmol/L — ABNORMAL LOW (ref 3.5–5.2)
Sodium: 141 mmol/L (ref 134–144)
eGFR: 65 mL/min/{1.73_m2} (ref >59)

## 2021-11-12 LAB — MAGNESIUM: Magnesium: 1.8 mg/dL (ref 1.6–2.3)

## 2021-11-12 NOTE — Progress Notes (Signed)
EPIC Encounter for ICM Monitoring  Patient Name: Gary Williamson is a 56 y.o. male Date: 11/12/2021 Primary Care Physican: [name], NP Primary Cardiologist: [name] Electrophysiologist: [name] Bi-V Pacing: 98%     08/25/2021 Office Weight: 07/27/2021 lbs   AT/AF Burden: <1% (taking Eliquis)   Transmission reviewed.  Pt seen in office on 7/12 by Dr [name] for ATP on 11/07/2021.     Corvue Thoracic impedance suggesting continued pattern of days with possible fluid accumulation days followed by return to normal or possible dryness.     Prescribed: Torsemide 20 mg take 2 tablets (40 mg total) daily. Potassium 20 mEq take 1 tablet daily Spironolactone 25 mg take 1 tablet daily   Labs: 11/11/2021 Creatinine 1.29, BUN 19, Potassium 3.4, Sodium 141, GFR 65 08/25/2021 Creatinine 1.26, BUN 15, Potassium 3.6, Sodium 146, GFR 67 08/12/2021 Creatinine 1.26, BUN 19, Potassium 3.6, Sodium 145, GFR 67 A complete set of results can be found in Results Review.   Recommendations given at 7/12 OV.   Follow-up plan: ICM clinic phone appointment on 12/14/2021.   91 day device clinic remote transmission 11/23/2020.     EP/Cardiology Office Visits:   02/05/2022 with Tommye Standard, PA.  01/25/2022 with Dr [name].    Copy of ICM check sent to Dr. [name].    3 month ICM trend: 11/09/2021.    12-14 Month ICM trend:     [name], RN 11/12/2021 12:57 PM

## 2021-11-13 ENCOUNTER — Other Ambulatory Visit: Payer: Self-pay | Admitting: *Deleted

## 2021-11-13 DIAGNOSIS — E876 Hypokalemia: Secondary | ICD-10-CM

## 2021-11-17 ENCOUNTER — Other Ambulatory Visit: Payer: BC Managed Care – PPO

## 2021-11-17 DIAGNOSIS — E876 Hypokalemia: Secondary | ICD-10-CM

## 2021-11-18 LAB — BASIC METABOLIC PANEL WITH GFR
BUN/Creatinine Ratio: 14 (ref 9–20)
BUN: 17 mg/dL (ref 6–24)
CO2: 25 mmol/L (ref 20–29)
Calcium: 9.3 mg/dL (ref 8.7–10.2)
Chloride: 102 mmol/L (ref 96–106)
Creatinine, Ser: 1.22 mg/dL (ref 0.76–1.27)
Glucose: 144 mg/dL — ABNORMAL HIGH (ref 70–99)
Potassium: 3.5 mmol/L (ref 3.5–5.2)
Sodium: 143 mmol/L (ref 134–144)
eGFR: 69 mL/min/1.73

## 2021-12-02 ENCOUNTER — Other Ambulatory Visit (HOSPITAL_BASED_OUTPATIENT_CLINIC_OR_DEPARTMENT_OTHER): Payer: Self-pay | Admitting: Nurse Practitioner

## 2021-12-02 DIAGNOSIS — E1165 Type 2 diabetes mellitus with hyperglycemia: Secondary | ICD-10-CM

## 2021-12-03 ENCOUNTER — Ambulatory Visit (INDEPENDENT_AMBULATORY_CARE_PROVIDER_SITE_OTHER): Payer: BC Managed Care – PPO

## 2021-12-03 DIAGNOSIS — I428 Other cardiomyopathies: Secondary | ICD-10-CM

## 2021-12-03 LAB — CUP PACEART REMOTE DEVICE CHECK
Battery Remaining Longevity: 86 mo
Battery Remaining Percentage: 93 %
Battery Voltage: 3.01 V
Brady Statistic AP VP Percent: 2.3 %
Brady Statistic AP VS Percent: 1 %
Brady Statistic AS VP Percent: 97 %
Brady Statistic AS VS Percent: 1 %
Brady Statistic RA Percent Paced: 2.1 %
Date Time Interrogation Session: 20230803020437
HighPow Impedance: 63 Ohm
Implantable Lead Implant Date: 20150421
Implantable Lead Implant Date: 20150421
Implantable Lead Implant Date: 20150421
Implantable Lead Location: 753857
Implantable Lead Location: 753859
Implantable Lead Location: 753860
Implantable Pulse Generator Implant Date: 20230421
Lead Channel Impedance Value: 360 Ohm
Lead Channel Impedance Value: 450 Ohm
Lead Channel Impedance Value: 740 Ohm
Lead Channel Pacing Threshold Amplitude: 0.75 V
Lead Channel Pacing Threshold Amplitude: 1 V
Lead Channel Pacing Threshold Amplitude: 1 V
Lead Channel Pacing Threshold Pulse Width: 0.5 ms
Lead Channel Pacing Threshold Pulse Width: 0.5 ms
Lead Channel Pacing Threshold Pulse Width: 0.5 ms
Lead Channel Sensing Intrinsic Amplitude: 4.4 mV
Lead Channel Sensing Intrinsic Amplitude: 5.5 mV
Lead Channel Setting Pacing Amplitude: 2 V
Lead Channel Setting Pacing Amplitude: 2 V
Lead Channel Setting Pacing Amplitude: 2 V
Lead Channel Setting Pacing Pulse Width: 0.5 ms
Lead Channel Setting Pacing Pulse Width: 0.5 ms
Lead Channel Setting Sensing Sensitivity: 0.5 mV
Pulse Gen Serial Number: 210000231

## 2021-12-14 ENCOUNTER — Ambulatory Visit (INDEPENDENT_AMBULATORY_CARE_PROVIDER_SITE_OTHER): Payer: BC Managed Care – PPO

## 2021-12-14 ENCOUNTER — Telehealth: Payer: Self-pay

## 2021-12-14 DIAGNOSIS — I5022 Chronic systolic (congestive) heart failure: Secondary | ICD-10-CM | POA: Diagnosis not present

## 2021-12-14 DIAGNOSIS — Z95810 Presence of automatic (implantable) cardiac defibrillator: Secondary | ICD-10-CM | POA: Diagnosis not present

## 2021-12-14 NOTE — Telephone Encounter (Signed)
Alert [facility] [employer]   Device alert for VT/VF with successful therapy Events occurred 8/11 between 13:55-13:57 There are four sustained VT/VF events wtih ATPx1 with only brief resolution of arrhythmia, last event ATPx1 followed by HV therapy 36J, converting both the AF and sustained VT to regular SR

## 2021-12-17 ENCOUNTER — Telehealth: Payer: Self-pay

## 2021-12-17 NOTE — Progress Notes (Signed)
EPIC Encounter for ICM Monitoring  Patient Name: Gary Williamson is a 56 y.o. male Date: 12/17/2021 Primary Care Physican: [name], NP Primary Cardiologist: [name] Electrophysiologist: [name] Bi-V Pacing: 98%     08/25/2021 Office Weight: 07/27/2021 lbs   AT/AF Burden: <1% (taking Eliquis)     Attempted call to patient and unable to reach.  Transmission reviewed. .  Device clinic triage addressing ATP/Shock 8/14 (see 8/14 phone note).   Corvue Thoracic impedance suggesting continued pattern of days with possible fluid accumulation days followed by return to normal or possible dryness.     Prescribed: Torsemide 20 mg take 2 tablets (40 mg total) daily. Potassium 20 mEq take 1 tablet daily Spironolactone 25 mg take 1 tablet daily   Labs: 11/17/2021 Creatinine 1.22, BUN 17, Potassium 3.5, Sodium 143, GFR 69 11/11/2021 Creatinine 1.29, BUN 19, Potassium 3.4, Sodium 141, GFR 65 08/25/2021 Creatinine 1.26, BUN 15, Potassium 3.6, Sodium 146, GFR 67 08/12/2021 Creatinine 1.26, BUN 19, Potassium 3.6, Sodium 145, GFR 67 A complete set of results can be found in Results Review.   Recommendations:  Unable to reach.     Follow-up plan: ICM clinic phone appointment on 01/18/2022.   91 day device clinic remote transmission 03/04/2021.     EP/Cardiology Office Visits:   02/05/2022 with Tommye Standard, PA.  01/25/2022 with Dr [name].    Copy of ICM check sent to Dr. [name].    3 month ICM trend: 12/14/2021.    12-14 Month ICM trend:     [name], RN 12/17/2021 10:40 AM

## 2021-12-17 NOTE — Telephone Encounter (Signed)
Remote ICM transmission received.  Attempted call to patient regarding ICM remote transmission and no answer.  

## 2021-12-18 ENCOUNTER — Encounter: Payer: BC Managed Care – PPO | Admitting: Internal Medicine

## 2021-12-24 ENCOUNTER — Telehealth (HOSPITAL_BASED_OUTPATIENT_CLINIC_OR_DEPARTMENT_OTHER): Payer: Self-pay | Admitting: Nurse Practitioner

## 2021-12-24 ENCOUNTER — Other Ambulatory Visit (HOSPITAL_COMMUNITY): Payer: Self-pay

## 2021-12-24 DIAGNOSIS — I428 Other cardiomyopathies: Secondary | ICD-10-CM

## 2021-12-24 DIAGNOSIS — I5022 Chronic systolic (congestive) heart failure: Secondary | ICD-10-CM

## 2021-12-24 DIAGNOSIS — I10 Essential (primary) hypertension: Secondary | ICD-10-CM

## 2021-12-24 MED ORDER — TORSEMIDE 20 MG PO TABS: 40.0000 mg | ORAL_TABLET | Freq: Every day | ORAL | 1 refills | Status: DC

## 2021-12-24 NOTE — Telephone Encounter (Signed)
Pt called on 8/24 @ 8:35 stated needing refills on spironolactone (ALDACTONE) 25 MG tablet [[id number]  & Entresto (was not in current medication list)  Pt is needing these sent to: [place], [place]  Pt is aware that he might need an OV since the last time he was here was in the office was back in April for his CPE and isn't scheduled until next April Please advise.

## 2021-12-25 NOTE — Telephone Encounter (Signed)
Pts calling regarding med refill. Pt would like a call back when this is sent in.

## 2021-12-25 NOTE — Progress Notes (Signed)
Remote ICD transmission.   

## 2021-12-29 MED ORDER — SPIRONOLACTONE 25 MG PO TABS: 25.0000 mg | ORAL_TABLET | Freq: Every day | ORAL | 3 refills | Status: DC

## 2021-12-29 MED ORDER — ENTRESTO 97-103 MG PO TABS: 1.0000 | ORAL_TABLET | Freq: Two times a day (BID) | ORAL | 3 refills | Status: DC

## 2021-12-29 NOTE — Telephone Encounter (Signed)
Please call patient to let him know that his medication has been sent.

## 2021-12-29 NOTE — Telephone Encounter (Signed)
Pt is aware that medication was called in

## 2022-01-05 NOTE — Telephone Encounter (Signed)
Left detailed message requesting call back to device clinic for shock.

## 2022-01-11 ENCOUNTER — Other Ambulatory Visit (HOSPITAL_BASED_OUTPATIENT_CLINIC_OR_DEPARTMENT_OTHER): Payer: Self-pay | Admitting: Nurse Practitioner

## 2022-01-11 DIAGNOSIS — E1165 Type 2 diabetes mellitus with hyperglycemia: Secondary | ICD-10-CM

## 2022-01-11 DIAGNOSIS — R7303 Prediabetes: Secondary | ICD-10-CM

## 2022-01-11 DIAGNOSIS — I5022 Chronic systolic (congestive) heart failure: Secondary | ICD-10-CM

## 2022-01-11 DIAGNOSIS — I428 Other cardiomyopathies: Secondary | ICD-10-CM

## 2022-01-16 ENCOUNTER — Other Ambulatory Visit (HOSPITAL_BASED_OUTPATIENT_CLINIC_OR_DEPARTMENT_OTHER): Payer: Self-pay | Admitting: Nurse Practitioner

## 2022-01-18 ENCOUNTER — Ambulatory Visit (INDEPENDENT_AMBULATORY_CARE_PROVIDER_SITE_OTHER): Payer: BC Managed Care – PPO

## 2022-01-18 DIAGNOSIS — I5022 Chronic systolic (congestive) heart failure: Secondary | ICD-10-CM | POA: Diagnosis not present

## 2022-01-18 DIAGNOSIS — Z95810 Presence of automatic (implantable) cardiac defibrillator: Secondary | ICD-10-CM | POA: Diagnosis not present

## 2022-01-18 NOTE — Progress Notes (Unsigned)
EPIC Encounter for ICM Monitoring  Patient Name: Gary Williamson is a 56 y.o. male Date: 01/18/2022 Primary Care Physican: [name], NP Primary Cardiologist: [name] Electrophysiologist: Marisa Sprinkles Pacing: 99%     08/25/2021 Office Weight: 07/27/2021 lbs   AT/AF Burden: <1% (taking Eliquis)     Attempted call to patient and unable to reach.   Transmission reviewed.   Device clinic triage addressed 8/11 ATP/Shock 8/14 (see 8/14 phone note).   Corvue Thoracic impedance suggesting possible fluid accumulation starting 9/16.     Prescribed: Torsemide 20 mg take 2 tablets (40 mg total) daily. Potassium 20 mEq take 1 tablet daily Spironolactone 25 mg take 1 tablet daily   Labs: 11/17/2021 Creatinine 1.22, BUN 17, Potassium 3.5, Sodium 143, GFR 69 11/11/2021 Creatinine 1.29, BUN 19, Potassium 3.4, Sodium 141, GFR 65 08/25/2021 Creatinine 1.26, BUN 15, Potassium 3.6, Sodium 146, GFR 67 08/12/2021 Creatinine 1.26, BUN 19, Potassium 3.6, Sodium 145, GFR 67 A complete set of results can be found in Results Review.   Recommendations:  Unable to reach.     Follow-up plan: ICM clinic phone appointment on 02/22/2022 (fluid levels will be checked at [facility] 9/25 & 10/6).   91 day device clinic remote transmission 03/04/2021.     EP/Cardiology Office Visits:   02/05/2022 with Tommye Standard, PA.  01/25/2022 with Dr [name].    Copy of ICM check sent to Dr. Quentin Ore.     3 month ICM trend: 01/18/2022.    12-14 Month ICM trend:     [name], RN 01/18/2022 8:08 AM

## 2022-01-19 ENCOUNTER — Other Ambulatory Visit: Payer: Self-pay | Admitting: Internal Medicine

## 2022-01-19 ENCOUNTER — Telehealth: Payer: Self-pay

## 2022-01-19 DIAGNOSIS — I4891 Unspecified atrial fibrillation: Secondary | ICD-10-CM

## 2022-01-19 NOTE — Telephone Encounter (Signed)
Prescription refill request for Eliquis received. Indication:Afib Last office visit: 11/11/21 ([name]) Scr: 1.22(11/17/21) Age: 56 Weight: 148.3kg  Appropriate dose and refill sent to requested pharmacy

## 2022-01-19 NOTE — Telephone Encounter (Signed)
Remote ICM transmission received.  Attempted call to patient regarding ICM remote transmission and no answer.  

## 2022-01-21 NOTE — Telephone Encounter (Signed)
Attempted to contact Pt regarding shock therapy received.  Left message requesting call back.  Pt has upcoming appt with HF 01/25/2022.           See paceart for additional details.

## 2022-01-25 ENCOUNTER — Ambulatory Visit (HOSPITAL_COMMUNITY)
Admission: RE | Admit: 2022-01-25 | Discharge: 2022-01-25 | Disposition: A | Discharge status: Home / Self Care | Payer: BC Managed Care – PPO | Source: Ambulatory Visit | Attending: Cardiology | Admitting: Cardiology

## 2022-01-25 VITALS — BP 120/80 | HR 56 | Wt 338.0 lb

## 2022-01-25 DIAGNOSIS — E669 Obesity, unspecified: Secondary | ICD-10-CM | POA: Diagnosis not present

## 2022-01-25 DIAGNOSIS — E785 Hyperlipidemia, unspecified: Secondary | ICD-10-CM | POA: Diagnosis not present

## 2022-01-25 DIAGNOSIS — I480 Paroxysmal atrial fibrillation: Secondary | ICD-10-CM | POA: Insufficient documentation

## 2022-01-25 DIAGNOSIS — I509 Heart failure, unspecified: Secondary | ICD-10-CM | POA: Insufficient documentation

## 2022-01-25 DIAGNOSIS — Z79899 Other long term (current) drug therapy: Secondary | ICD-10-CM | POA: Diagnosis not present

## 2022-01-25 DIAGNOSIS — I428 Other cardiomyopathies: Secondary | ICD-10-CM | POA: Insufficient documentation

## 2022-01-25 DIAGNOSIS — Z7901 Long term (current) use of anticoagulants: Secondary | ICD-10-CM | POA: Insufficient documentation

## 2022-01-25 DIAGNOSIS — I472 Ventricular tachycardia, unspecified: Secondary | ICD-10-CM | POA: Insufficient documentation

## 2022-01-25 DIAGNOSIS — Z7984 Long term (current) use of oral hypoglycemic drugs: Secondary | ICD-10-CM | POA: Diagnosis not present

## 2022-01-25 DIAGNOSIS — I5022 Chronic systolic (congestive) heart failure: Secondary | ICD-10-CM

## 2022-01-25 DIAGNOSIS — I110 Hypertensive heart disease with heart failure: Secondary | ICD-10-CM | POA: Insufficient documentation

## 2022-01-25 DIAGNOSIS — R519 Headache, unspecified: Secondary | ICD-10-CM | POA: Insufficient documentation

## 2022-01-25 LAB — BRAIN NATRIURETIC PEPTIDE: B Natriuretic Peptide: 12.2 pg/mL (ref 0.0–100.0)

## 2022-01-25 LAB — BASIC METABOLIC PANEL
Anion gap: 12 (ref 5–15)
BUN: 18 mg/dL (ref 6–20)
CO2: 26 mmol/L (ref 22–32)
Calcium: 9.2 mg/dL (ref 8.9–10.3)
Chloride: 103 mmol/L (ref 98–111)
Creatinine, Ser: 1.27 mg/dL — ABNORMAL HIGH (ref 0.61–1.24)
GFR, Estimated: >60 mL/min (ref >60)
Glucose, Bld: 89 mg/dL (ref 70–99)
Potassium: 3.7 mmol/L (ref 3.5–5.1)
Sodium: 141 mmol/L (ref 135–145)

## 2022-01-25 MED ORDER — AMIODARONE HCL 200 MG PO TABS: ORAL_TABLET | ORAL | 3 refills | Status: DC

## 2022-01-25 MED ORDER — METOPROLOL SUCCINATE ER 100 MG PO TB24: 100.0000 mg | ORAL_TABLET | Freq: Two times a day (BID) | ORAL | 3 refills | Status: DC

## 2022-01-25 NOTE — Patient Instructions (Addendum)
Increase Toprol XL to 100 mg Twice daily  Start Amiodarone 200 mg for 10 days, then change to 200 mg daily.  Labs done today, your results will be available in MyChart, we will contact you for abnormal readings.  Your physician has requested that you have an echocardiogram. Echocardiography is a painless test that uses sound waves to create images of your heart. It provides your doctor with information about the size and shape of your heart and how well your heart's chambers and valves are working. This procedure takes approximately one hour. There are no restrictions for this procedure.  Your physician has recommended that you have a cardiopulmonary stress test (CPX). CPX testing is a non-invasive measurement of heart and lung function. It replaces a traditional treadmill stress test. This type of test provides a tremendous amount of information that relates not only to your present condition but also for future outcomes. This test combines measurements of you ventilation, respiratory gas exchange in the lungs, electrocardiogram (EKG), blood pressure and physical response before, during, and following an exercise protocol.  PLEASE FOLLOW UP WITH YOUR DEVICE DR. ASAP  Your physician recommends that you schedule a follow-up appointment in: 2 months  If you have any questions or concerns before your next appointment please send [name] a message through [place] or call our office at [phone].    TO LEAVE A MESSAGE FOR THE NURSE SELECT OPTION 2, PLEASE LEAVE A MESSAGE INCLUDING: YOUR NAME DATE OF BIRTH CALL BACK NUMBER REASON FOR CALL**this is important as we prioritize the call backs  YOU WILL RECEIVE A CALL BACK THE SAME DAY AS LONG AS YOU CALL BEFORE 4:00 PM  At the [facility], you and your health needs are our priority. As part of our continuing mission to provide you with exceptional heart care, we have created designated Provider Care Teams. These Care Teams include your  primary Cardiologist (physician) and Advanced Practice Providers (APPs- Physician Assistants and Nurse Practitioners) who all work together to provide you with the care you need, when you need it.   You may see any of the following providers on your designated Care Team at your next follow up: Dr Glori Bickers Dr [name] Dr. Roxana Hires, NP Lyda Jester, [place] [facility] [place], [place] Forestine Na, NP Audry Riles, PharmD   Please be sure to bring in all your medications bottles to every appointment.

## 2022-01-26 NOTE — Progress Notes (Signed)
Patient ID: Gary Williamson, male   DOB: 27-Jun-1965, 56 y.o.   MRN: [id number] PCP: Reginia Forts Cardiology: Dr. [name]  56 y.o. with a h/o nonischemic cardiomyopathy, LBBB, HTN, and NYHA Class II CHF presents for followup of CHF.  Echo in 12/14 showed that his EF remains 20-25%.  He had a St Jude CRT-D system placed in 4/15.  Echo in 12/15 showed EF 25-30%, mild LV dilation, normal RV size and systolic function.    Echo in 7/21 showed EF up to 40-45%, diffuse hypokinesis, normal RV. Echo in 11/22 showed EF 35-40%, mild LV dilation.   Patient had ICD shock in 7/23 due to rapid atrial fibrillation (in appropriate).  In 8/23, he had VT with ATP then shock.  Device clinic was unable to contact him. He had been standing outside in the heat for a long period at a funeral and had not taken his meds that day.  No loss of consciousness or fall.   He returns for followup of CHF.  I have not seen him since 7/21 though he has followed in the device clinic.  He says that he has continued all his meds. Weight down 18 lbs since last appt here. Good exercise tolerance, no exertional dyspnea or chest pain.  No lightheadedness.   St Jude device interrogation: stable thoracic impedance, 99% BiV pacing, <1% atrial fibrillation  ECG (personally reviewed): NSR with BiV paced   Labs (8/11): K 4.5, creatinine 1.3, LDL 79, HDL 26 Labs (2/12): K 4.3, creatinine 1.5 Labs (7/12): K 4.5, creatinine 1.2 Labs (12/14): K 4.4, creatinine 1.2, LDL 87, HDL 32 Labs (4/15): K 4.2, creatinine 1.03 Labs (03/25/14) : K 4.9 Creatinine 0.9 Cholesterol 161 TGL 104 HDL 31 LDL 109  Labs (4/16): K 4.4, creatinine 1.12, HCT 42.2, LDL 101, HDL 30 Labs (7/18): TSH mildly elevated, LDL 106, K 4.1, creatinine 0.98 Labs (4/21): K 3.8, creatinine 1.23 Labs (4/23): LDL 114 Labs (7/23): K 3.5, creatinine 1.22  Allergies (verified):  No Known Drug Allergies  Past Medical History: 1. Cardiomyopathy: Nonischemic.  He had a left heart cath in  2002 or 2003 with no significant coronary disease per his report (this was done in [place]). It sounds like his CMP has been present since about 2000.  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  Echo (5/11): severe LV dilation with EF < 20%, diffuse hypokinesis, moderate diastolic dysfunction, severe left atrial enlargement.  First hospitalization for CHF was in 5/11.  No drug use.  Adenosine myoview (6/11): EF 21%, diffuse hypokinesis, scar in the anterior and inferior walls and at the apex.  Mild peri-infarct ischemia.  LHC was done (7/11) showing EF 15%, global hypokinesis, LVEDP 22 mmHg, no angiographic CAD.  Echo (10/11): EF 20-25%, severely dilated LV with diffuse hypokinesis, mild MR.  Echo (12/14) with EF 20-25%, moderately dilated LV, diffuse hypokinesis, cannot rule out apical thrombus.  He was unable to tolerate cardiac MRI.  He had St Jude CRT-D implantation in 4/15. Echo (12/15) with EF 25-30%, mild LV dilation, mild LVH, normal RV size and systolic function.  - Echo (7/21): EF 40-45%, diffuse hypokinesis, normal RV.  - Echo (11/22): EF 35-40%, mild LV dilation. 2. Hypertension 3. Chronic left bundle branch block 4. Hyperlipidemia 5. Atrial fibrillation: Paroxysmal, noted on device interrogation.  6. VT  Family History: Family history is negative for premature diagnosis of coronary artery disease or any arrhythmia or history of heart failure in first-degree  relatives.  Mother and father do have a history of hypertension  Social History: The patient lives in [place] with his wife and 2 children.   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   Current Outpatient Medications  Medication Sig Dispense Refill   amiodarone (PACERONE)  200 MG tablet Take 1 tablet (200 mg total) by mouth 2 (two) times daily AND 1 tablet (200 mg total) daily. 265 tablet 3   apixaban (ELIQUIS) 5 MG TABS tablet Take 1 tablet by mouth twice daily 60 tablet 5   blood glucose meter kit and supplies Dispense based on patient and insurance preference. Check blood sugar every morning before eating and up to three additional times a day as needed (FOR ICD-10 E10.9, E11.9). 1 each 0   Cholecalciferol (VITAMIN D) 2000 units CAPS Take 2,000 Units by mouth daily.     FARXIGA 10 MG TABS tablet TAKE 1 TABLET BY MOUTH ONCE DAILY . APPOINTMENT REQUIRED FOR FUTURE REFILLS 90 tablet 0   fish oil-omega-3 fatty acids 1000 MG capsule Take 1 capsule (1 g total) by mouth daily. 30 capsule 2   Lancets (ONETOUCH DELICA PLUS TFTDDU20U) MISC USE 1 LANCET TO CHECK GLUCOSE EVERY MORNING BEFORE EATING AND UP TO 3 ADDITIONAL TIMES PER DAY AS NEEDED 100 each 1   metFORMIN (GLUCOPHAGE) 500 MG tablet TAKE 2 TABLETS BY MOUTH BEFORE BREAKFAST AND 2 WITH DINNER 120 tablet 0   Multiple Vitamin (MULTIVITAMIN) tablet Take 1 tablet by mouth daily.     ONETOUCH VERIO test strip USE 1 STRIP TO CHECK GLUCOSE IN THE MORNING BEFORE EATING AND UP TO 3 ADDITIONAL TIMES PER DAY AS NEEDED 100 each 0   potassium chloride SA (KLOR-CON) 20 MEQ tablet Take 1 tablet (20 mEq total) by mouth daily. 90 tablet 1   rosuvastatin (CRESTOR) 20 MG tablet Take 2 tabs (63m) by mouth Monday, Wednesday, and Friday. Take 1 tab (222m by mouth Sunday, Tuesday, Thursday, and Saturday. For cholesterol. 138 tablet 3   sacubitril-valsartan (ENTRESTO) 97-103 MG Take 1 tablet by mouth 2 (two) times daily. 180 tablet 3   spironolactone (ALDACTONE) 25 MG tablet Take 1 tablet (25 mg total) by mouth daily. 90 tablet 3   torsemide (DEMADEX) 20 MG tablet Take 2 tablets (40 mg total) by mouth daily. 60 tablet 1   metoprolol succinate (TOPROL-XL) 100 MG 24 hr tablet Take 1 tablet (100 mg total) by mouth 2 (two) times daily. Take two  tablets by mouth in the morning and one tablet at night 180 tablet 3   No current facility-administered medications for this encounter.    BP 120/80   Pulse (!) 56   Wt (!) 153.3 kg (338 lb)   SpO2 96%   BMI 44.59 kg/m  General: NAD, obese Neck: No JVD, no thyromegaly or thyroid nodule.  Lungs: Clear to auscultation bilaterally with normal respiratory effort. CV: Nondisplaced PMI.  Heart regular S1/S2, no S3/S4, no murmur.  No peripheral edema.  No carotid bruit.  Normal pedal pulses.  Abdomen: Soft, nontender, no hepatosplenomegaly, no distention.  Skin: Intact without lesions or rashes.  Neurologic: Alert and oriented x 3.  Psych: Normal affect. Extremities: No clubbing or cyanosis.  HEENT: Normal.   Assessment/Plan: 1. Nonischemic  cardiomyopathy: Doing well symptomatically. NYHA class I-II symptoms, stable.  He has a Research officer, political party CRT-D system. Not volume overloaded on exam or by Corvue.  Last echo in 11/22 showed EF 35-40% with normal RV.   - Continue torsemide 40 mg daily.  BMET today.  - Increase Toprol XL to 100 mg bid.  - Continue Entresto 97/103 bid and spironolactone 25 mg daily.  - Continue dapagliflozin 10 mg daily.  - He had headaches with Bidil and stopped.  - With VT in 8/23, I will get repeat echo.  - I will arrange for CPX for prognostic purposes.  2. Obesity: He is working on weight loss.   - I mentioned to him that semaglutide would be an option.  3. Hyperlipidemia: Continue Crestor. 4. Atrial fibrillation: Paroxysmal.  - Continue apixaban.  5. VT: Episode in 8/23.  Not associated with chest pain, doubt ACS.  He had 2 shocks this summer, 1 inappropriate due to AF and the other appropriate.  - Increasing Toprol XL as above.  - Start amiodarone 200 mg bid x 10 days then 200 mg daily.  Discussed possible side effects and will screen for them in the future.   - Needs EP followup, will arrange.    Followup in 2 months  [name] 01/26/2022

## 2022-01-31 ENCOUNTER — Other Ambulatory Visit (HOSPITAL_BASED_OUTPATIENT_CLINIC_OR_DEPARTMENT_OTHER): Payer: Self-pay | Admitting: Nurse Practitioner

## 2022-02-05 ENCOUNTER — Encounter: Payer: BC Managed Care – PPO | Admitting: Physician Assistant

## 2022-02-07 ENCOUNTER — Other Ambulatory Visit (HOSPITAL_BASED_OUTPATIENT_CLINIC_OR_DEPARTMENT_OTHER): Payer: Self-pay | Admitting: Nurse Practitioner

## 2022-02-07 DIAGNOSIS — E1165 Type 2 diabetes mellitus with hyperglycemia: Secondary | ICD-10-CM

## 2022-02-09 ENCOUNTER — Encounter (HOSPITAL_COMMUNITY): Payer: BC Managed Care – PPO

## 2022-02-16 ENCOUNTER — Ambulatory Visit (HOSPITAL_COMMUNITY): Payer: BC Managed Care – PPO | Attending: Cardiology

## 2022-02-16 DIAGNOSIS — I5022 Chronic systolic (congestive) heart failure: Secondary | ICD-10-CM

## 2022-02-21 ENCOUNTER — Other Ambulatory Visit (HOSPITAL_COMMUNITY): Payer: Self-pay | Admitting: Cardiology

## 2022-02-21 DIAGNOSIS — I5022 Chronic systolic (congestive) heart failure: Secondary | ICD-10-CM

## 2022-02-21 DIAGNOSIS — I428 Other cardiomyopathies: Secondary | ICD-10-CM

## 2022-02-21 DIAGNOSIS — I10 Essential (primary) hypertension: Secondary | ICD-10-CM

## 2022-02-22 ENCOUNTER — Ambulatory Visit (INDEPENDENT_AMBULATORY_CARE_PROVIDER_SITE_OTHER): Payer: BC Managed Care – PPO

## 2022-02-22 ENCOUNTER — Telehealth (HOSPITAL_COMMUNITY): Payer: Self-pay

## 2022-02-22 DIAGNOSIS — Z95810 Presence of automatic (implantable) cardiac defibrillator: Secondary | ICD-10-CM | POA: Diagnosis not present

## 2022-02-22 DIAGNOSIS — I5022 Chronic systolic (congestive) heart failure: Secondary | ICD-10-CM

## 2022-02-22 NOTE — Progress Notes (Signed)
EPIC Encounter for ICM Monitoring  Patient Name: Gary Williamson is a 56 y.o. male Date: 02/22/2022 Primary Care Physican: [name], NP Primary Cardiologist: [name] Electrophysiologist: Marisa Sprinkles Pacing: 99%     01/25/2022 Office Weight: 338 lbs   AT/AF Burden: <1% (taking Eliquis)     Transmission reviewed.    Corvue Thoracic impedance suggesting normal fluid levels.     Prescribed: Torsemide 20 mg take 2 tablets (40 mg total) daily. Potassium 20 mEq take 1 tablet daily Spironolactone 25 mg take 1 tablet daily   Labs: 01/25/2022 Creatinine 1.27, BUN 18, Potasium 3.7, Sodium 141, GFR >60 11/17/2021 Creatinine 1.22, BUN 17, Potassium 3.5, Sodium 143, GFR 69 11/11/2021 Creatinine 1.29, BUN 19, Potassium 3.4, Sodium 141, GFR 65 08/25/2021 Creatinine 1.26, BUN 15, Potassium 3.6, Sodium 146, GFR 67 08/12/2021 Creatinine 1.26, BUN 19, Potassium 3.6, Sodium 145, GFR 67 A complete set of results can be found in Results Review.   Recommendations:  Unable to reach.     Follow-up plan: ICM clinic phone appointment on 03/29/2022.   91 day device clinic remote transmission 03/04/2021.     EP/Cardiology Office Visits:   02/05/2022 with Tommye Standard, PA canceled and msg sent to EP scheduler to contact patient for rescheduling appt.  04/05/2022 with Dr [name].    Copy of ICM check sent to Dr. Quentin Ore.      3 month ICM trend: 02/22/2022.    [name], RN 02/22/2022 8:56 AM

## 2022-02-22 NOTE — Telephone Encounter (Addendum)
Pt aware, agreeable, and verbalized understanding   ----- Message from Larey Dresser, MD sent at 02/16/2022  4:23 PM EDT ----- Mild HF limitation.  This is ok.

## 2022-03-04 ENCOUNTER — Ambulatory Visit (INDEPENDENT_AMBULATORY_CARE_PROVIDER_SITE_OTHER): Payer: BC Managed Care – PPO

## 2022-03-04 DIAGNOSIS — I428 Other cardiomyopathies: Secondary | ICD-10-CM

## 2022-03-04 LAB — CUP PACEART REMOTE DEVICE CHECK
Battery Remaining Longevity: 84 mo
Battery Remaining Percentage: 90 %
Battery Voltage: 2.98 V
Brady Statistic AP VP Percent: 5.5 %
Brady Statistic AP VS Percent: 1 %
Brady Statistic AS VP Percent: 93 %
Brady Statistic AS VS Percent: 1.1 %
Brady Statistic RA Percent Paced: 5.3 %
Date Time Interrogation Session: 20231102020035
HighPow Impedance: 70 Ohm
Implantable Lead Connection Status: 753985
Implantable Lead Connection Status: 753985
Implantable Lead Connection Status: 753985
Implantable Lead Implant Date: 20150421
Implantable Lead Implant Date: 20150421
Implantable Lead Implant Date: 20150421
Implantable Lead Location: 753857
Implantable Lead Location: 753859
Implantable Lead Location: 753860
Implantable Pulse Generator Implant Date: 20230421
Lead Channel Impedance Value: 380 Ohm
Lead Channel Impedance Value: 480 Ohm
Lead Channel Impedance Value: 790 Ohm
Lead Channel Pacing Threshold Amplitude: 0.75 V
Lead Channel Pacing Threshold Amplitude: 1 V
Lead Channel Pacing Threshold Amplitude: 1 V
Lead Channel Pacing Threshold Pulse Width: 0.5 ms
Lead Channel Pacing Threshold Pulse Width: 0.5 ms
Lead Channel Pacing Threshold Pulse Width: 0.5 ms
Lead Channel Sensing Intrinsic Amplitude: 3.5 mV
Lead Channel Sensing Intrinsic Amplitude: 7.3 mV
Lead Channel Setting Pacing Amplitude: 2 V
Lead Channel Setting Pacing Amplitude: 2 V
Lead Channel Setting Pacing Amplitude: 2 V
Lead Channel Setting Pacing Pulse Width: 0.5 ms
Lead Channel Setting Pacing Pulse Width: 0.5 ms
Lead Channel Setting Sensing Sensitivity: 0.5 mV
Pulse Gen Serial Number: 210000231

## 2022-03-16 NOTE — Progress Notes (Signed)
Remote ICD transmission.   

## 2022-03-25 ENCOUNTER — Other Ambulatory Visit (HOSPITAL_BASED_OUTPATIENT_CLINIC_OR_DEPARTMENT_OTHER): Payer: Self-pay | Admitting: Nurse Practitioner

## 2022-03-25 DIAGNOSIS — E1165 Type 2 diabetes mellitus with hyperglycemia: Secondary | ICD-10-CM

## 2022-03-29 ENCOUNTER — Ambulatory Visit (INDEPENDENT_AMBULATORY_CARE_PROVIDER_SITE_OTHER): Payer: BC Managed Care – PPO

## 2022-03-29 DIAGNOSIS — I5022 Chronic systolic (congestive) heart failure: Secondary | ICD-10-CM

## 2022-03-29 DIAGNOSIS — Z95810 Presence of automatic (implantable) cardiac defibrillator: Secondary | ICD-10-CM

## 2022-03-31 NOTE — Progress Notes (Unsigned)
Cardiology Office Note Date:  04/01/2022  Patient ID:  Gary Williamson, Gary Williamson 1965/12/09, MRN [id number] PCP:  No primary care provider on file.  Cardiologist:  Dr. [name] Electrophysiologist: Dr. [name]     Chief Complaint:  3 mo  History of Present Illness: Gary Williamson is a 56 y.o. 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 [name] 11/11/21, he had gotten shocked for Afib, no marked symptoms associated with it, was in a hot gym watching basketball game and had not taken his meds that day.  His Toprol was increased. Discussed AAD, and ablation as possible strategies, pt preferred more conservative approach with increase in his BB.  He saw dr. [name] 01/25/22.  Discussed his appropriate shock in Aug for VT had been out in the heat at a funeral and had not taken his medicines. Off BiDil w/headaches Started on amiodarone given AFib and VT, Toprol further increased  TODAY He feels well. No further shocks. No CP, palpitations or cardiac awareness No near syncope or syncope. Denies any changes to his exertional capacity or unusual SOB. He will have an occasional nose bleed, not difficult to stop, no bleeding or signs of bleeding otherwise.  Both events of RVR/VT have been in the environment of missing meds and heated environments, discussed to avoid hot environment as much as able and importance of medication compliance, he reports that rarely does he miss his meds that those instances were unusual   Device information Abbott CRT-D implanted 08/21/2013, gen change 08/21/21  + for appropriate (VT) and inappropriate (rapid AF) shocks AAD Amiodarone started Sept 2023  Past Medical History:  Diagnosis Date   Acute idiopathic pericarditis 03/24/2010   Overview:  Viral Idiopathic Pericarditis    Body mass index (BMI) of 45.0-49.9 in adult [facility]) 08/18/2020   CHF (congestive heart  failure) ([place])    Chronic systolic heart failure ([place])    Colon cancer screening    Encounter to establish care 08/18/2020   Hematuria, microscopic 05/15/2013   05/15/2013 visit with [name], MD at [facility]. Normal CT scan. Recommended annual urine for microscopy and serum creatinine x 3 years. 04/20/2013 visit with [name], MD at [facility].    Hypertension    LBBB (left bundle branch block)    Chronic   Leg edema    NICM (nonischemic cardiomyopathy) ([place])    Probably nonischemic; LHC in 2002/03 no sig CAD per report (done in [place]). Cardiomyopathy for 9-10 yrs. 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 Echo 5/11: severe LVE, EF <20%, diff HK, mod dias dys, sev. LAE. Hosp for CHF in 5/11. Ad. MV 6/11: EF 21%, mild per-infart ischemia. LHC (7/11): EF 15%, no CAD, LVEDP 22; Echo (10/11): EF 20-25%;  Echo (12/12):  EF 20%, mod LVH, Gr 1 DD, diff HK, mild LAE   OSA (obstructive sleep apnea)    cpap not  used   Prediabetes 08/18/2020   Pure hypercholesterolemia    Severe obesity ([place])    Sleep apnea    Vitamin D deficiency     Past Surgical History:  Procedure Laterality Date   BI-VENTRICULAR IMPLANTABLE CARDIOVERTER DEFIBRILLATOR N/A 08/21/2013   Procedure: BI-VENTRICULAR IMPLANTABLE CARDIOVERTER DEFIBRILLATOR  (CRT-D);  Surgeon: Coralyn Mark, MD;  Location: [facility] C[facility] LAB;  Service: Cardiovascular;  Laterality: N/A;   BIV ICD GENERATOR CHANGEOUT N/A 08/21/2021   Procedure: BIV  ICD GENERATOR CHANGEOUT;  Surgeon: [name], MD;  Location: [place] CV LAB;  Service: Cardiovascular;  Laterality: N/A;   CARDIAC CATHETERIZATION  2002 or 2003   COLONOSCOPY N/A 09/16/2014   Procedure: COLONOSCOPY;  Surgeon: [name], MD;  Location: [facility] ENDOSCOPY;  Service: Endoscopy;  Laterality: N/A;   COLONOSCOPY     LEFT HEART CATH  11/13/2009   Dr [name]   POLYPECTOMY      Current Outpatient Medications  Medication Sig Dispense Refill   amiodarone  (PACERONE) 200 MG tablet Take 1 tablet (200 mg total) by mouth 2 (two) times daily AND 1 tablet (200 mg total) daily. 265 tablet 3   apixaban (ELIQUIS) 5 MG TABS tablet Take 1 tablet by mouth twice daily 60 tablet 5   blood glucose meter kit and supplies Dispense based on patient and insurance preference. Check blood sugar every morning before eating and up to three additional times a day as needed (FOR ICD-10 E10.9, E11.9). 1 each 0   Cholecalciferol (VITAMIN D) 2000 units CAPS Take 2,000 Units by mouth daily.     FARXIGA 10 MG TABS tablet TAKE 1 TABLET BY MOUTH ONCE DAILY . APPOINTMENT REQUIRED FOR FUTURE REFILLS 90 tablet 0   fish oil-omega-3 fatty acids 1000 MG capsule Take 1 capsule (1 g total) by mouth daily. 30 capsule 2   Lancets (ONETOUCH DELICA PLUS [id number]) MISC USE 1 LANCET FOR FINGERSTICK TO CHECK GLUCOSE IN THE MORNING BEFORE  EATING  AND  UP  TO  3  ADDITIONAL  TIMES  PER  DAY  AS  NEEDED 100 each 0   metFORMIN (GLUCOPHAGE) 500 MG tablet TAKE 2 TABLETS BY MOUTH TWICE DAILY BEFORE BREAKFAST AND  SUPPER 120 tablet 0   metoprolol succinate (TOPROL-XL) 100 MG 24 hr tablet Take 1 tablet (100 mg total) by mouth 2 (two) times daily. Take two tablets by mouth in the morning and one tablet at night 180 tablet 3   Multiple Vitamin (MULTIVITAMIN) tablet Take 1 tablet by mouth daily.     ONETOUCH VERIO test strip USE 1 STRIP TO CHECK GLUCOSE ONCE DAILY IN THE MORNING BEFORE EATING AND UP TO THREE ADDITIONAL TIMES DAILY AS NEEDED 100 each 0   potassium chloride SA (KLOR-CON) 20 MEQ tablet Take 1 tablet (20 mEq total) by mouth daily. 90 tablet 1   rosuvastatin (CRESTOR) 20 MG tablet Take 2 tabs (83m) by mouth Monday, Wednesday, and Friday. Take 1 tab (285m by mouth Sunday, Tuesday, Thursday, and Saturday. For cholesterol. 138 tablet 3   sacubitril-valsartan (ENTRESTO) 97-103 MG Take 1 tablet by mouth 2 (two) times daily. 180 tablet 3   spironolactone (ALDACTONE) 25 MG tablet Take 1 tablet (25 mg  total) by mouth daily. 90 tablet 3   torsemide (DEMADEX) 20 MG tablet Take 2 tablets by mouth once daily 180 tablet 2   No current facility-administered medications for this visit.    Allergies:   Patient has no known allergies.   Social History:  The patient  reports that he has never smoked. He has never used smokeless tobacco. He reports current alcohol use. He reports that he does not use drugs.   Family History:  The patient's family history includes Alzheimer's disease in his father and mother; Arthritis in his father; Breast cancer in his mother; COPD in his brother; Dementia in his father; Hyperlipidemia in his mother; Hypertension in his father and mother; Stroke in his maternal grandmother.  ROS:  Please see the history  of present illness.    All other systems are reviewed and otherwise negative.   PHYSICAL EXAM:  VS:  BP 126/82   Pulse 75   Ht _0  (1.854 m)   Wt (!) 353 lb (160.1 kg)   SpO2 95%   BMI 46.57 kg/m  BMI: Body mass index is 46.57 kg/m. Well nourished, well developed, in no acute distress HEENT: normocephalic, atraumatic Neck: no JVD, carotid bruits or masses Cardiac:  RRR; no significant murmurs, no rubs, or gallops Lungs:  CTA b/l, no wheezing, rhonchi or rales Abd: soft, nontender MS: no deformity or atrophy Ext: no edema Skin: warm and dry, no rash Neuro:  No gross deficits appreciated Psych: euthymic mood, full affect  ICD site is stable, no tethering or discomfort   EKG:  not done today  Device interrogation done today and reviewed by myself:  Battery and lead measurements are good No recurrent arrhythmias/therapies since 12/11/21 CorVue on a downward trend  12/11/21 He had ATP and HV therapies EGMs are reviewed He is in AFib/flutter and has R-R irregularity with morphology that appears to be his baseline morphology though eventually gets very fast towards 300bpm (with CL as fast 247m) and in review with EP MD in the office is likely double  tachycardia with AFlutter/VT  02/16/22: CPX Attending: Mild functional limitation due to patient's obesity. There is no clear evidence for a HF limitation. Resting spirometry suggests mild restrictive lung physiology.    03/19/21: TTE 1. Left ventricular ejection fraction, by estimation, is 35 to 40%. The  left ventricle has moderately decreased function. The left ventricle  demonstrates global hypokinesis. The left ventricular internal cavity size  was mildly dilated. There is mild  left ventricular hypertrophy. Left ventricular diastolic parameters are  consistent with Grade I diastolic dysfunction (impaired relaxation).   2. Peak RV-RA gradient 20 mmHg. IVC not visualized. Right ventricular  systolic function was not well visualized. The right ventricular size is  normal.   3. Left atrial size was mildly dilated.   4. The mitral valve is normal in structure. No evidence of mitral valve  regurgitation. No evidence of mitral stenosis.   5. The aortic valve is tricuspid. Aortic valve regurgitation is not  visualized. No aortic stenosis is present.   6. Technically difficult study with poor acoustic windows.   Recent Labs: 08/25/2021: ALT 14 11/11/2021: Hemoglobin 15.1; Magnesium 1.8; Platelets 298; TSH 2.860 01/25/2022: B Natriuretic Peptide 12.2; BUN 18; Creatinine, Ser 1.27; Potassium 3.7; Sodium 141  08/25/2021: Chol/HDL Ratio 5.3; Cholesterol, Total 175; HDL 33; LDL Chol Calc (NIH) 114; Triglycerides 156   CrCl cannot be calculated (Patient's most recent lab result is older than the maximum 21 days allowed.).   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 MAundra Dubinnext week and will be getting labs there Will defer LFT/sTSH to his labs next week   I did advise the patient no driving 6 mo (from 83/84/66 and discussed rational for it  Paroxysmal AFib CHA2DS2Vasc is 2, on Eliquis, appropriately dosed <1 % burden Amiodarone   NICM Chronic  CHF (systolic) No symptoms or exam findings of volume OL CorVue trending downwards though without signs/symptoms otherwise of volume OL I will send note to Dr. [name], he is seeing him next week   We discussed need for a new EP MD< recommended Dr. Quentin Ore, he is fine with that if Dr. [name] is as well.  He will ask him next week as well.    Disposition: F/u with EP in 101mo sooner if needed, remotes as usual  Current medicines are reviewed at length with the patient today.  The patient did not have any concerns regarding medicines.  SVenetia Night PA-C 04/01/2022 10:11 AM     [facility] [address] [place] [phone](office)  [phone](fax)

## 2022-04-01 ENCOUNTER — Ambulatory Visit: Payer: BC Managed Care – PPO | Attending: Physician Assistant | Admitting: Physician Assistant

## 2022-04-01 ENCOUNTER — Encounter: Payer: Self-pay | Admitting: Physician Assistant

## 2022-04-01 VITALS — BP 126/82 | HR 75 | Ht 73.0 in | Wt 353.0 lb

## 2022-04-01 DIAGNOSIS — I480 Paroxysmal atrial fibrillation: Secondary | ICD-10-CM | POA: Diagnosis not present

## 2022-04-01 DIAGNOSIS — I428 Other cardiomyopathies: Secondary | ICD-10-CM

## 2022-04-01 DIAGNOSIS — I5022 Chronic systolic (congestive) heart failure: Secondary | ICD-10-CM | POA: Diagnosis not present

## 2022-04-01 DIAGNOSIS — I472 Ventricular tachycardia, unspecified: Secondary | ICD-10-CM

## 2022-04-01 DIAGNOSIS — Z95810 Presence of automatic (implantable) cardiac defibrillator: Secondary | ICD-10-CM | POA: Diagnosis not present

## 2022-04-01 LAB — CUP PACEART INCLINIC DEVICE CHECK
Date Time Interrogation Session: 20231130104050
Implantable Lead Connection Status: 753985
Implantable Lead Connection Status: 753985
Implantable Lead Connection Status: 753985
Implantable Lead Implant Date: 20150421
Implantable Lead Implant Date: 20150421
Implantable Lead Implant Date: 20150421
Implantable Lead Location: 753857
Implantable Lead Location: 753859
Implantable Lead Location: 753860
Implantable Pulse Generator Implant Date: 20230421
Lead Channel Pacing Threshold Amplitude: 0.75 V
Lead Channel Pacing Threshold Amplitude: 1 V
Lead Channel Pacing Threshold Amplitude: 1 V
Lead Channel Pacing Threshold Pulse Width: 0.5 ms
Lead Channel Pacing Threshold Pulse Width: 0.5 ms
Lead Channel Pacing Threshold Pulse Width: 0.5 ms
Lead Channel Sensing Intrinsic Amplitude: 12 mV
Lead Channel Sensing Intrinsic Amplitude: 3.6 mV
Pulse Gen Serial Number: 210000231

## 2022-04-01 MED ORDER — DAPAGLIFLOZIN PROPANEDIOL 10 MG PO TABS: ORAL_TABLET | ORAL | 0 refills | Status: DC

## 2022-04-01 NOTE — Patient Instructions (Addendum)
  NO DRIVING FOR 6 MONTHS  PARKING CODE FOR HEART FAILURE CLINIC IS [phone]    Medication Instructions:   Your physician recommends that you continue on your current medications as directed. Please refer to the Current Medication list given to you today.  *If you need a refill on your cardiac medications before your next appointment, please call your pharmacy*   Lab Work: [place]   If you have labs (blood work) drawn today and your tests are completely normal, you will receive your results only by: [place] (if you have MyChart) OR A paper copy in the mail If you have any lab test that is abnormal or we need to change your treatment, we will call you to review the results.   Testing/Procedures: NONE ORDERED  TODAY    Follow-Up: At [facility], you and your health needs are our priority.  As part of our continuing mission to provide you with exceptional heart care, we have created designated Provider Care Teams.  These Care Teams include your primary Cardiologist (physician) and Advanced Practice Providers (APPs -  Physician Assistants and Nurse Practitioners) who all work together to provide you with the care you need, when you need it.  We recommend signing up for the patient portal called "MyChart".  Sign up information is provided on this After Visit Summary.  MyChart is used to connect with patients for Virtual Visits (Telemedicine).  Patients are able to view lab/test results, encounter notes, upcoming appointments, etc.  Non-urgent messages can be sent to your provider as well.   To learn more about what you can do with MyChart, go to [email or web address].    Your next appointment:   6 month(s)  The format for your next appointment:   In Person  Provider:   Lars Mage, MD      Important Information About Sugar

## 2022-04-02 NOTE — Progress Notes (Signed)
EPIC Encounter for ICM Monitoring  Patient Name: Gary Williamson is a 56 y.o. male Date: 04/02/2022 Primary Care Physican: Patient, No Pcp Per Primary Cardiologist: [name] Electrophysiologist: Marisa Sprinkles Pacing: 99%     04/01/2022 Office Weight: 353 lbs   AT/AF Burden: <1% (taking Eliquis)     Transmission reviewed.  Pt had defib office check 11/30 with Tommye Standard, PA.    Corvue Thoracic impedance normal but was suggesting intermittent days with possible fluid accumulation.     Prescribed: Torsemide 20 mg take 2 tablets (40 mg total) daily. Potassium 20 mEq take 1 tablet daily Spironolactone 25 mg take 1 tablet daily   Labs: 01/25/2022 Creatinine 1.27, BUN 18, Potasium 3.7, Sodium 141, GFR >60 11/17/2021 Creatinine 1.22, BUN 17, Potassium 3.5, Sodium 143, GFR 69 11/11/2021 Creatinine 1.29, BUN 19, Potassium 3.4, Sodium 141, GFR 65 08/25/2021 Creatinine 1.26, BUN 15, Potassium 3.6, Sodium 146, GFR 67 08/12/2021 Creatinine 1.26, BUN 19, Potassium 3.6, Sodium 145, GFR 67 A complete set of results can be found in Results Review.   Recommendations:  Recommendations given at 04/01/2022 OV.     Follow-up plan: ICM clinic phone appointment on 05/10/2022.   91 day device clinic remote transmission 06/03/2022.     EP/Cardiology Office Visits:    04/05/2022 with Dr [name].   Recall 09/08/2022 with Dr Quentin Ore.   Copy of ICM check sent to Dr. Quentin Ore.      3 month ICM trend: 03/29/2022.    12-14 Month ICM trend:     [name], RN 04/02/2022 1:10 PM

## 2022-04-05 ENCOUNTER — Ambulatory Visit (HOSPITAL_BASED_OUTPATIENT_CLINIC_OR_DEPARTMENT_OTHER)
Admission: RE | Admit: 2022-04-05 | Discharge: 2022-04-05 | Disposition: A | Discharge status: Home / Self Care | Payer: BC Managed Care – PPO | Source: Ambulatory Visit | Attending: Cardiology | Admitting: Cardiology

## 2022-04-05 ENCOUNTER — Telehealth: Payer: Self-pay | Admitting: Pharmacist

## 2022-04-05 ENCOUNTER — Encounter (HOSPITAL_COMMUNITY): Payer: Self-pay | Admitting: Cardiology

## 2022-04-05 ENCOUNTER — Ambulatory Visit (HOSPITAL_COMMUNITY)
Admission: RE | Admit: 2022-04-05 | Discharge: 2022-04-05 | Disposition: A | Discharge status: Home / Self Care | Payer: BC Managed Care – PPO | Source: Ambulatory Visit | Attending: Cardiology | Admitting: Cardiology

## 2022-04-05 VITALS — BP 110/70 | HR 72 | Wt 342.2 lb

## 2022-04-05 DIAGNOSIS — I447 Left bundle-branch block, unspecified: Secondary | ICD-10-CM | POA: Diagnosis not present

## 2022-04-05 DIAGNOSIS — I480 Paroxysmal atrial fibrillation: Secondary | ICD-10-CM | POA: Insufficient documentation

## 2022-04-05 DIAGNOSIS — R519 Headache, unspecified: Secondary | ICD-10-CM | POA: Insufficient documentation

## 2022-04-05 DIAGNOSIS — Z79899 Other long term (current) drug therapy: Secondary | ICD-10-CM | POA: Diagnosis not present

## 2022-04-05 DIAGNOSIS — I5022 Chronic systolic (congestive) heart failure: Secondary | ICD-10-CM | POA: Diagnosis not present

## 2022-04-05 DIAGNOSIS — E785 Hyperlipidemia, unspecified: Secondary | ICD-10-CM | POA: Insufficient documentation

## 2022-04-05 DIAGNOSIS — Z7901 Long term (current) use of anticoagulants: Secondary | ICD-10-CM | POA: Insufficient documentation

## 2022-04-05 DIAGNOSIS — I428 Other cardiomyopathies: Secondary | ICD-10-CM | POA: Diagnosis present

## 2022-04-05 DIAGNOSIS — I110 Hypertensive heart disease with heart failure: Secondary | ICD-10-CM | POA: Diagnosis not present

## 2022-04-05 LAB — ECHOCARDIOGRAM COMPLETE
Area-P 1/2: 1.96 cm2
Calc EF: 50.1 %
S' Lateral: 4.1 cm
Single Plane A2C EF: 54.6 %
Single Plane A4C EF: 46.7 %

## 2022-04-05 LAB — COMPREHENSIVE METABOLIC PANEL
ALT: 21 U/L (ref 0–44)
AST: 19 U/L (ref 15–41)
Albumin: 3.8 g/dL (ref 3.5–5.0)
Alkaline Phosphatase: 59 U/L (ref 38–126)
Anion gap: 7 (ref 5–15)
BUN: 14 mg/dL (ref 6–20)
CO2: 28 mmol/L (ref 22–32)
Calcium: 9.1 mg/dL (ref 8.9–10.3)
Chloride: 106 mmol/L (ref 98–111)
Creatinine, Ser: 1.24 mg/dL (ref 0.61–1.24)
GFR, Estimated: >60 mL/min (ref >60)
Glucose, Bld: 93 mg/dL (ref 70–99)
Potassium: 3.6 mmol/L (ref 3.5–5.1)
Sodium: 141 mmol/L (ref 135–145)
Total Bilirubin: 0.6 mg/dL (ref 0.3–1.2)
Total Protein: 7.2 g/dL (ref 6.5–8.1)

## 2022-04-05 LAB — TSH: TSH: 2.053 u[IU]/mL (ref 0.350–4.500)

## 2022-04-05 MED ORDER — PERFLUTREN LIPID MICROSPHERE
1.0000 mL | INTRAVENOUS | Status: DC | PRN
  Administered 2022-04-05: 2 mL via INTRAVENOUS

## 2022-04-05 NOTE — Progress Notes (Signed)
  Echocardiogram 2D Echocardiogram has been performed.  Gary Williamson 04/05/2022, 3:08 PM

## 2022-04-05 NOTE — Telephone Encounter (Signed)
Pt referred by CHF clinic to initiate [place] for weight loss. Pt does have dx of DM with most recent A1c well controlled at 6% on metformin and [place]. His weight is 342 lbs with BMI of 45, he would benefit from weight loss and CV benefit associated with GLP1RA.  Tried submitting request for Mounjaro 2.'5mg'$  SQ once weekly to pt's insurance to check on coverage and received message: "[employer] is not able to process this request through [place], please contact the plan at [phone] or fax in request to [phone]." Called phone # which is only for Part D patients which pt does not have. They transferred me to commercial prior authorization line which is apparently closed even though their line then says they are open 8am-6pm central time.   Will need to try back later, unclear why they are currently closed. Will call pt once benefit coverage is known.

## 2022-04-05 NOTE — Patient Instructions (Signed)
There has been no changes to your medications.  Labs done today, your results will be available in MyChart, we will contact you for abnormal readings.  You have been referred to the Heart Care Pharmacist for [facility]. They will call you to arrange your appointment.  Your physician recommends that you schedule a follow-up appointment in: 4 months  If you have any questions or concerns before your next appointment please send [name] a message through [place] or call our office at [phone].    TO LEAVE A MESSAGE FOR THE NURSE SELECT OPTION 2, PLEASE LEAVE A MESSAGE INCLUDING: YOUR NAME DATE OF BIRTH CALL BACK NUMBER REASON FOR CALL**this is important as we prioritize the call backs  YOU WILL RECEIVE A CALL BACK THE SAME DAY AS LONG AS YOU CALL BEFORE 4:00 PM  At the [facility], you and your health needs are our priority. As part of our continuing mission to provide you with exceptional heart care, we have created designated Provider Care Teams. These Care Teams include your primary Cardiologist (physician) and Advanced Practice Providers (APPs- Physician Assistants and Nurse Practitioners) who all work together to provide you with the care you need, when you need it.   You may see any of the following providers on your designated Care Team at your next follow up: Dr Glori Bickers Dr [name] Dr. Roxana Hires, NP Lyda Jester, [place] [facility] [place], [place] Forestine Na, NP Audry Riles, PharmD   Please be sure to bring in all your medications bottles to every appointment.

## 2022-04-06 NOTE — Progress Notes (Signed)
Patient ID: Gary Williamson, male   DOB: Feb 26, 1966, 56 y.o.   MRN: [id number] PCP: Reginia Forts Cardiology: Dr. [name]  56 y.o. with a h/o nonischemic cardiomyopathy, LBBB, HTN, and NYHA Class II CHF presents for followup of CHF.  Echo in 12/14 showed that his EF remains 20-25%.  He had a St Jude CRT-D system placed in 4/15.  Echo in 12/15 showed EF 25-30%, mild LV dilation, normal RV size and systolic function.    Echo in 7/21 showed EF up to 40-45%, diffuse hypokinesis, normal RV. Echo in 11/22 showed EF 35-40%, mild LV dilation.   Patient had ICD shock in 7/23 due to rapid atrial fibrillation (in appropriate).  In 8/23, he had VT with ATP then shock.  Device clinic was unable to contact him. He had been standing outside in the heat for a long period at a funeral and had not taken his meds that day.  No loss of consciousness or fall.   CPX in 10/23 showed mild functional limitation primarily due to body habitus.  Echo was done today and reviewed, EF 40%, mild LVH, mild LV dilation, mildly decreased RV systolic function.   He returns for followup of CHF.   No further VT/shocks.  No significant exertional dyspnea.  He is doing some walking for exercise.  Weight is up 4 lbs.  No orthopnea/PND.  No lightheadedness or palpitations.    St Jude device interrogation: stable thoracic impedance, >99% BiV pacing, 21% a-pacing, no AF/VT.   ECG (personally reviewed): NSR with BiV pacing   Labs (8/11): K 4.5, creatinine 1.3, LDL 79, HDL 26 Labs (2/12): K 4.3, creatinine 1.5 Labs (7/12): K 4.5, creatinine 1.2 Labs (12/14): K 4.4, creatinine 1.2, LDL 87, HDL 32 Labs (4/15): K 4.2, creatinine 1.03 Labs (03/25/14) : K 4.9 Creatinine 0.9 Cholesterol 161 TGL 104 HDL 31 LDL 109  Labs (4/16): K 4.4, creatinine 1.12, HCT 42.2, LDL 101, HDL 30 Labs (7/18): TSH mildly elevated, LDL 106, K 4.1, creatinine 0.98 Labs (4/21): K 3.8, creatinine 1.23 Labs (4/23): LDL 114 Labs (7/23): K 3.5, creatinine 1.22 Labs  (9/23): BNP 12, K 3.7, creatinine 1.27  Allergies (verified):  No Known Drug Allergies  Past Medical History: 1. Cardiomyopathy: Nonischemic.  He had a left heart cath in 2002 or 2003 with no significant coronary disease per his report (this was done in [place]). It sounds like his CMP has been present since about 2000.  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  Echo (5/11): severe LV dilation with EF < 20%, diffuse hypokinesis, moderate diastolic dysfunction, severe left atrial enlargement.  First hospitalization for CHF was in 5/11.  No drug use.  Adenosine myoview (6/11): EF 21%, diffuse hypokinesis, scar in the anterior and inferior walls and at the apex.  Mild peri-infarct ischemia.  LHC was done (7/11) showing EF 15%, global hypokinesis, LVEDP 22 mmHg, no angiographic CAD.  Echo (10/11): EF 20-25%, severely dilated LV with diffuse hypokinesis, mild MR.  Echo (12/14) with EF 20-25%, moderately dilated LV, diffuse hypokinesis, cannot rule out apical thrombus.  He was unable to tolerate cardiac MRI.  He had St Jude CRT-D implantation in 4/15. Echo (12/15) with EF 25-30%, mild LV dilation, mild LVH, normal RV size and systolic function.  - Echo (7/21): EF 40-45%, diffuse hypokinesis, normal RV.  - Echo (11/22): EF 35-40%, mild LV dilation. - CPX (10/23): peak VO2 15.7, RER 1.18, VE/VCO2 slope 31.  Mild functional limitation  primarily due to obesity/deconditioning.  - Echo (12/23): EF 40%, mild LVH, mild LV dilation, mildly decreased RV systolic function.  2. Hypertension 3. Chronic left bundle branch block 4. Hyperlipidemia 5. Atrial fibrillation: Paroxysmal, noted on device interrogation.  6. VT  Family History: Family history is negative for premature diagnosis of coronary artery disease or any arrhythmia or history of heart failure in first-degree relatives.  Mother and father do have a history of hypertension  Social History: The patient lives in  [place] with his wife and 2 children.   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   Current Outpatient Medications  Medication Sig Dispense Refill   amiodarone (PACERONE) 200 MG tablet Take 200 mg by mouth daily.     apixaban (ELIQUIS) 5 MG TABS tablet Take 1 tablet by mouth twice daily 60 tablet 5   blood glucose meter kit and supplies Dispense based on patient and insurance preference. Check blood sugar every morning before eating and up to three additional times a day as needed (FOR ICD-10 E10.9, E11.9). 1 each 0   Cholecalciferol (VITAMIN D) 2000 units CAPS Take 2,000 Units by mouth daily.     dapagliflozin propanediol (FARXIGA) 10 MG TABS tablet TAKE 1 TABLET BY MOUTH ONCE DAILY . APPOINTMENT REQUIRED FOR FUTURE REFILLS 90 tablet 0   fish oil-omega-3 fatty acids 1000 MG capsule Take 1 capsule (1 g total) by mouth daily. 30 capsule 2   Lancets (ONETOUCH DELICA PLUS [id number]) MISC USE 1 LANCET FOR FINGERSTICK TO CHECK GLUCOSE IN THE MORNING BEFORE  EATING  AND  UP  TO  3  ADDITIONAL  TIMES  PER  DAY  AS  NEEDED 100 each 0   metFORMIN (GLUCOPHAGE) 500 MG tablet TAKE 2 TABLETS BY MOUTH TWICE DAILY BEFORE BREAKFAST AND  SUPPER 120 tablet 0   metoprolol succinate (TOPROL-XL) 100 MG 24 hr tablet Take 1 tablet (100 mg total) by mouth 2 (two) times daily. Take two tablets by mouth in the morning and one tablet at night 180 tablet 3   Multiple Vitamin (MULTIVITAMIN) tablet Take 1 tablet by mouth daily.     ONETOUCH VERIO test strip USE 1 STRIP TO CHECK GLUCOSE ONCE DAILY IN THE MORNING BEFORE EATING AND UP TO THREE ADDITIONAL TIMES DAILY AS NEEDED 100 each 0   potassium chloride SA (KLOR-CON) 20 MEQ tablet Take 1 tablet (20 mEq total) by mouth daily. 90 tablet 1   rosuvastatin  (CRESTOR) 20 MG tablet Take 2 tabs (19m) by mouth Monday, Wednesday, and Friday. Take 1 tab (25m by mouth Sunday, Tuesday, Thursday, and Saturday. For cholesterol. 138 tablet 3   sacubitril-valsartan (ENTRESTO) 97-103 MG Take 1 tablet by mouth 2 (two) times daily. 180 tablet 3   spironolactone (ALDACTONE) 25 MG tablet Take 1 tablet (25 mg total) by mouth daily. 90 tablet 3   torsemide (DEMADEX) 20 MG tablet Take 2 tablets by mouth once daily 180 tablet 2   No current facility-administered medications for this encounter.    BP 110/70   Pulse 72   Wt (!) 155.2 kg (342 lb 3.2 oz)   SpO2 96%   BMI 45.15 kg/m  General: NAD, obese Neck: No JVD, no thyromegaly or thyroid nodule.  Lungs: Clear  to auscultation bilaterally with normal respiratory effort. CV: Nondisplaced PMI.  Heart regular S1/S2, no S3/S4, no murmur.  No peripheral edema.  No carotid bruit.  Normal pedal pulses.  Abdomen: Soft, nontender, no hepatosplenomegaly, no distention.  Skin: Intact without lesions or rashes.  Neurologic: Alert and oriented x 3.  Psych: Normal affect. Extremities: No clubbing or cyanosis.  HEENT: Normal.   Assessment/Plan: 1. Nonischemic cardiomyopathy: Doing well symptomatically. NYHA class I-II symptoms, stable.  He has a [employer] CRT-D system. Not volume overloaded on exam or Corvue.  Echo today showed EF 40%.    - Continue torsemide 40 mg daily.  BMET today.  - Continue Toprol XL 100 mg bid.  - Continue Entresto 97/103 bid and spironolactone 25 mg daily.  - Continue dapagliflozin 10 mg daily.  - He had headaches with Bidil and stopped.   2. Obesity: Needs significant weight loss.    - I will refer to pharmacy clinic for semaglutide.   3. Hyperlipidemia: Continue Crestor. 4. Atrial fibrillation: Paroxysmal.  - Continue apixaban.  5. VT: Episode in 8/23.  Not associated with chest pain, doubt ACS.  He had 2 shocks this summer, 1 inappropriate due to AF and the other appropriate.  - Continue  Toprol XL.   - Continue amiodarone 200 mg daily.  Check LFTs and TSH today, needs regular eye exam.  - Has EP followup.   Followup in 4 months with APP  [name] 04/06/2022

## 2022-04-07 NOTE — Telephone Encounter (Addendum)
Called insurance again, they are still closed even though listed hours say they should be open. Tried calling # on back of pt's insurance card which is different, they transferred me, then was told they don't handle PAs for pt even though there's an Rx BIN on his card and a pharmacist #, and that [facility] does, [phone]. They transferred me to prior auth department which was just a survey line that ended my call.   Unable to assess medication coverage for patient, have spent over an hour calling 5+ insurance #s, none of whom have been able to submit prior authorizations for coverage.  Called pt to see if he's interested in appt with PharmD, would need to just submit med to pharmacy and wait for them to populate a prior auth form at that time since we are unable to proactively. He is interested in learning more about the medications, appt scheduled for this Friday at [place] with PharmD to discuss.

## 2022-04-07 NOTE — Progress Notes (Unsigned)
Patient ID: Gary Williamson                 DOB: 23-Apr-1966                    MRN: [id number]     HPI: Gary Williamson is a 56 y.o. male patient referred to pharmacy clinic by Dr [name] to initiate weight loss therapy with GLP1-RA. PMH is significant for obesity, HFrEF due to NICMP s/p CRT-D with most recent EF 40% on 04/2022, HTN, PAF, VT, LBBB, DM, and HLD. Most recent BMI 45.  Pt presents today to discuss [place]. A1c well controlled, 6.5% on 08/18/20, 6% on 08/25/21. Takes metformin and Farxiga (CHF as well).  Diet:  -Breakfast: egg and bacon -Lunch: fast food, grilled chicken wrap and fries, chili, tries to eat veggies -Dinner: stew, veggie spring rolls -Snacks:  -Drinks: diet Mountain Dew arnold palmer No red meat or shellfish with gout  Exercise: Walks with his wife and at work  Family History: Family history is negative for premature diagnosis of coronary artery disease or any arrhythmia or history of heart failure in first-degree relatives. Mother and father do have a history of hypertension   Social History: The patient lives in [place] with his wife and 2 children.   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  He denies significant tobacco, EtOH, nor any illicit drug use.  Labs: Lab Results  Component Value Date   HGBA1C 6.0 (H) 08/25/2021    Wt Readings from Last 1 Encounters:  04/05/22 (!) 342 lb 3.2 oz (155.2 kg)    BP Readings from Last 1 Encounters:  04/05/22 110/70   Pulse Readings from Last 1 Encounters:  04/05/22 72       Component Value Date/Time   CHOL 175 08/25/2021 0853   TRIG 156 (H) 08/25/2021 0853   HDL 33 (L) 08/25/2021 0853   CHOLHDL 5.3 (H) 08/25/2021 0853   CHOLHDL 5.7 08/18/2020 1514   VLDL 33 08/18/2020 1514   LDLCALC 114 (H) 08/25/2021 0853    Past Medical History:  Diagnosis Date   Acute idiopathic pericarditis 03/24/2010   Overview:  Viral Idiopathic Pericarditis    Body mass index (BMI) of 45.0-49.9 in adult  [facility]) 08/18/2020   CHF (congestive heart failure) (HCC)    Chronic systolic heart failure ([place])    Colon cancer screening    Encounter to establish care 08/18/2020   Hematuria, microscopic 05/15/2013   05/15/2013 visit with [name], MD at [facility]. Normal CT scan. Recommended annual urine for microscopy and serum creatinine x 3 years. 04/20/2013 visit with [name], MD at [facility].    Hypertension    LBBB (left bundle branch block)    Chronic   Leg edema    NICM (nonischemic cardiomyopathy) ([employer])    Probably nonischemic; LHC in 2002/03 no sig CAD per report (done in [place]). Cardiomyopathy for 9-10 yrs. 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 Echo 5/11: severe LVE, EF <20%, diff HK, mod dias dys, sev. LAE. Hosp for CHF in 5/11. Ad. MV 6/11: EF 21%, mild per-infart ischemia. LHC (7/11): EF 15%, no CAD, LVEDP 22; Echo (10/11): EF 20-25%;  Echo (12/12):  EF 20%, mod LVH, Gr 1 DD, diff HK, mild LAE   OSA (obstructive sleep apnea)    cpap not  used   Prediabetes 08/18/2020   Pure hypercholesterolemia    Severe obesity ([place])    Sleep apnea  Vitamin D deficiency     Current Outpatient Medications on File Prior to Visit  Medication Sig Dispense Refill   amiodarone (PACERONE) 200 MG tablet Take 200 mg by mouth daily.     apixaban (ELIQUIS) 5 MG TABS tablet Take 1 tablet by mouth twice daily 60 tablet 5   blood glucose meter kit and supplies Dispense based on patient and insurance preference. Check blood sugar every morning before eating and up to three additional times a day as needed (FOR ICD-10 E10.9, E11.9). 1 each 0   Cholecalciferol (VITAMIN D) 2000 units CAPS Take 2,000 Units by mouth daily.     dapagliflozin propanediol (FARXIGA) 10 MG TABS tablet TAKE 1 TABLET BY MOUTH ONCE DAILY . APPOINTMENT REQUIRED FOR FUTURE REFILLS 90 tablet 0   fish oil-omega-3 fatty acids 1000 MG capsule Take 1 capsule (1 g total) by mouth daily. 30 capsule 2   Lancets  (ONETOUCH DELICA PLUS [id number]) MISC USE 1 LANCET FOR FINGERSTICK TO CHECK GLUCOSE IN THE MORNING BEFORE  EATING  AND  UP  TO  3  ADDITIONAL  TIMES  PER  DAY  AS  NEEDED 100 each 0   metFORMIN (GLUCOPHAGE) 500 MG tablet TAKE 2 TABLETS BY MOUTH TWICE DAILY BEFORE BREAKFAST AND  SUPPER 120 tablet 0   metoprolol succinate (TOPROL-XL) 100 MG 24 hr tablet Take 1 tablet (100 mg total) by mouth 2 (two) times daily. Take two tablets by mouth in the morning and one tablet at night 180 tablet 3   Multiple Vitamin (MULTIVITAMIN) tablet Take 1 tablet by mouth daily.     ONETOUCH VERIO test strip USE 1 STRIP TO CHECK GLUCOSE ONCE DAILY IN THE MORNING BEFORE EATING AND UP TO THREE ADDITIONAL TIMES DAILY AS NEEDED 100 each 0   potassium chloride SA (KLOR-CON) 20 MEQ tablet Take 1 tablet (20 mEq total) by mouth daily. 90 tablet 1   rosuvastatin (CRESTOR) 20 MG tablet Take 2 tabs (21m) by mouth Monday, Wednesday, and Friday. Take 1 tab (247m by mouth Sunday, Tuesday, Thursday, and Saturday. For cholesterol. 138 tablet 3   sacubitril-valsartan (ENTRESTO) 97-103 MG Take 1 tablet by mouth 2 (two) times daily. 180 tablet 3   spironolactone (ALDACTONE) 25 MG tablet Take 1 tablet (25 mg total) by mouth daily. 90 tablet 3   torsemide (DEMADEX) 20 MG tablet Take 2 tablets by mouth once daily 180 tablet 2   No current facility-administered medications on file prior to visit.    No Known Allergies   Assessment/Plan:  1. Weight loss - Discussed glycemic, cardiovascular, and weight loss benefit associated with GLP1RA. Reviewed injection technique of both Ozempic and Mounjaro as well as side effect profile. Pt wishes to research meds on his own and will call me if he would like to start on GLMission WoodsTried to look into insurance coverage ahead of time but was unsuccessful after spending about an hour calling 5+ #s. Would have a preference for Mounjaro based on easier injection and better weight loss data, and would send rx to  pt's pharmacy to see if covered at that time. He will have the same rx insurance in 2024. Would likely be able to stop metformin. Also reviewed heart-healthy diet and goal of 150 minutes of physical activity per week.  Taren Dymek E. Marvie Calender, PharmD, BCACP, CPMidway [id number]GrCow CreekNC [address]hone: [phone]: [phone]/12/2021 8:42 AM

## 2022-04-09 ENCOUNTER — Ambulatory Visit: Payer: BC Managed Care – PPO | Attending: Interventional Cardiology | Admitting: Pharmacist

## 2022-04-09 VITALS — Wt 346.0 lb

## 2022-04-09 DIAGNOSIS — E1165 Type 2 diabetes mellitus with hyperglycemia: Secondary | ICD-10-CM

## 2022-04-09 DIAGNOSIS — Z6841 Body Mass Index (BMI) 40.0 and over, adult: Secondary | ICD-10-CM | POA: Diagnosis not present

## 2022-04-09 NOTE — Patient Instructions (Addendum)
We talked about Ozempic or Mounjaro injections. These medications are subcutaneous shots given once a week in the fatty tissue of the stomach. They help with glucose control, have cardiac benefit (lower risk of heart attack and stroke), and can help with weight loss (10-20%)  Call Jinny Blossom, PharmD in clinic if you would like to try one of these medications and we can look into insurance coverage [phone]

## 2022-05-10 ENCOUNTER — Ambulatory Visit (INDEPENDENT_AMBULATORY_CARE_PROVIDER_SITE_OTHER): Payer: BC Managed Care – PPO

## 2022-05-10 DIAGNOSIS — I5022 Chronic systolic (congestive) heart failure: Secondary | ICD-10-CM

## 2022-05-10 DIAGNOSIS — Z95810 Presence of automatic (implantable) cardiac defibrillator: Secondary | ICD-10-CM | POA: Diagnosis not present

## 2022-05-13 ENCOUNTER — Encounter: Payer: Self-pay | Admitting: Internal Medicine

## 2022-05-13 NOTE — Progress Notes (Signed)
EPIC Encounter for ICM Monitoring  Patient Name: Gary Williamson is a 57 y.o. male Date: 05/13/2022 Primary Care Physican: [name], NP Primary Cardiologist: [name] Electrophysiologist: Marisa Sprinkles Pacing: >99%     04/05/2022 Office Weight: 342 lbs   AT/AF Burden: 0% (taking Eliquis)     Transmission reviewed.     Corvue Thoracic impedance normal but was suggesting intermittent days with possible fluid accumulation.     Prescribed: Torsemide 20 mg take 2 tablets (40 mg total) daily. Potassium 20 mEq take 1 tablet daily Spironolactone 25 mg take 1 tablet daily   Labs: 04/06/2023 Creatinine 1.24, BUN 14, Potassium 3.6, Sodium 141, GFR >60 01/25/2022 Creatinine 1.27, BUN 18, Potassium 3.7, Sodium 141, GFR >60 11/17/2021 Creatinine 1.22, BUN 17, Potassium 3.5, Sodium 143, GFR 69 11/11/2021 Creatinine 1.29, BUN 19, Potassium 3.4, Sodium 141, GFR 65 08/25/2021 Creatinine 1.26, BUN 15, Potassium 3.6, Sodium 146, GFR 67 08/12/2021 Creatinine 1.26, BUN 19, Potassium 3.6, Sodium 145, GFR 67 A complete set of results can be found in Results Review.   Recommendations:  No changes.   Follow-up plan: ICM clinic phone appointment on 06/14/2022.   91 day device clinic remote transmission 06/03/2022.     EP/Cardiology Office Visits:   08/05/2022 with HF clinic.   Recall 09/08/2022 with Dr Quentin Ore.   Copy of ICM check sent to Dr. Quentin Ore.   3 month ICM trend: 05/10/2022.    12-14 Month ICM trend:     [name], RN 05/13/2022 7:29 AM

## 2022-05-14 ENCOUNTER — Telehealth: Payer: Self-pay | Admitting: Pharmacist

## 2022-05-14 MED ORDER — MOUNJARO 2.5 MG/0.5ML ~~LOC~~ SOAJ: 2.5000 mg | SUBCUTANEOUS | 0 refills | Status: DC

## 2022-05-14 NOTE — Telephone Encounter (Signed)
Pt called clinic, he would like to start [facility]. Rx sent to pharmacy, will wait for them to send over prior auth form to complete (couldn't find correct form to submit when I looked into coverage before his appt with me despite calling his insurance a few times).

## 2022-05-17 NOTE — Telephone Encounter (Signed)
Called pharmacy to follow up, they stated they filled Mounjaro on 1/12 and pt has already picked it up. Previously reviewed med with pt in clinic. Will call him in 3-4 weeks for dose titration and tolerability update.

## 2022-06-03 ENCOUNTER — Ambulatory Visit (INDEPENDENT_AMBULATORY_CARE_PROVIDER_SITE_OTHER): Payer: BC Managed Care – PPO

## 2022-06-03 DIAGNOSIS — I428 Other cardiomyopathies: Secondary | ICD-10-CM | POA: Diagnosis not present

## 2022-06-03 LAB — CUP PACEART REMOTE DEVICE CHECK
Battery Remaining Longevity: 83 mo
Battery Remaining Percentage: 86 %
Battery Voltage: 2.98 V
Brady Statistic AP VP Percent: 7.4 %
Brady Statistic AP VS Percent: 1 %
Brady Statistic AS VP Percent: 92 %
Brady Statistic AS VS Percent: 1 %
Brady Statistic RA Percent Paced: 7.3 %
Date Time Interrogation Session: 20240201021155
HighPow Impedance: 73 Ohm
Implantable Lead Connection Status: 753985
Implantable Lead Connection Status: 753985
Implantable Lead Connection Status: 753985
Implantable Lead Implant Date: 20150421
Implantable Lead Implant Date: 20150421
Implantable Lead Implant Date: 20150421
Implantable Lead Location: 753857
Implantable Lead Location: 753859
Implantable Lead Location: 753860
Implantable Pulse Generator Implant Date: 20230421
Lead Channel Impedance Value: 380 Ohm
Lead Channel Impedance Value: 500 Ohm
Lead Channel Impedance Value: 830 Ohm
Lead Channel Pacing Threshold Amplitude: 0.75 V
Lead Channel Pacing Threshold Amplitude: 1 V
Lead Channel Pacing Threshold Amplitude: 1 V
Lead Channel Pacing Threshold Pulse Width: 0.5 ms
Lead Channel Pacing Threshold Pulse Width: 0.5 ms
Lead Channel Pacing Threshold Pulse Width: 0.5 ms
Lead Channel Sensing Intrinsic Amplitude: 12 mV
Lead Channel Sensing Intrinsic Amplitude: 4.7 mV
Lead Channel Setting Pacing Amplitude: 2 V
Lead Channel Setting Pacing Amplitude: 2 V
Lead Channel Setting Pacing Amplitude: 2 V
Lead Channel Setting Pacing Pulse Width: 0.5 ms
Lead Channel Setting Pacing Pulse Width: 0.5 ms
Lead Channel Setting Sensing Sensitivity: 0.5 mV
Pulse Gen Serial Number: 210000231

## 2022-06-04 ENCOUNTER — Telehealth: Payer: Self-pay | Admitting: Pharmacist

## 2022-06-04 MED ORDER — MOUNJARO 5 MG/0.5ML ~~LOC~~ SOAJ: 5.0000 mg | SUBCUTANEOUS | 0 refills | Status: DC

## 2022-06-04 NOTE — Telephone Encounter (Signed)
Called pt to follow up with Mounjaro tolerability and dose titration. Has given 3 injections so far, due for 4th on Monday. No GI side effects, tolerating well. Has noticed appetite decreasing a bit. Sent in rx for next month dose of '5mg'$  weekly. Will call pt in another month for continued dose titration.

## 2022-06-14 ENCOUNTER — Ambulatory Visit: Payer: BC Managed Care – PPO

## 2022-06-14 DIAGNOSIS — I5022 Chronic systolic (congestive) heart failure: Secondary | ICD-10-CM | POA: Diagnosis not present

## 2022-06-14 DIAGNOSIS — Z95810 Presence of automatic (implantable) cardiac defibrillator: Secondary | ICD-10-CM | POA: Diagnosis not present

## 2022-06-17 NOTE — Progress Notes (Signed)
EPIC Encounter for ICM Monitoring  Patient Name: Gary Williamson is a 58 y.o. male Date: 06/17/2022 Primary Care Physican: [name], NP Primary Cardiologist: [name] Electrophysiologist: Marisa Sprinkles Pacing: >99%     04/05/2022 Office Weight: 342 lbs   AT/AF Burden: 0% (taking Eliquis)     Transmission reviewed.     Corvue Thoracic impedance normal but was suggesting intermittent days with possible fluid accumulation.     Prescribed: Torsemide 20 mg take 2 tablets (40 mg total) daily. Potassium 20 mEq take 1 tablet daily Spironolactone 25 mg take 1 tablet daily   Labs: 04/06/2023 Creatinine 1.24, BUN 14, Potassium 3.6, Sodium 141, GFR >60 01/25/2022 Creatinine 1.27, BUN 18, Potassium 3.7, Sodium 141, GFR >60 11/17/2021 Creatinine 1.22, BUN 17, Potassium 3.5, Sodium 143, GFR 69 11/11/2021 Creatinine 1.29, BUN 19, Potassium 3.4, Sodium 141, GFR 65 08/25/2021 Creatinine 1.26, BUN 15, Potassium 3.6, Sodium 146, GFR 67 08/12/2021 Creatinine 1.26, BUN 19, Potassium 3.6, Sodium 145, GFR 67 A complete set of results can be found in Results Review.   Recommendations:  No changes.   Follow-up plan: ICM clinic phone appointment on 07/19/2022.   91 day device clinic remote transmission 5/06/03/2022.     EP/Cardiology Office Visits:   08/05/2022 with HF clinic.   Recall 09/08/2022 with Dr Quentin Ore.   Copy of ICM check sent to Dr. Quentin Ore.   3 month ICM trend: 06/14/2022.    12-14 Month ICM trend:     [name], RN 06/17/2022 12:58 PM

## 2022-06-23 ENCOUNTER — Other Ambulatory Visit (HOSPITAL_BASED_OUTPATIENT_CLINIC_OR_DEPARTMENT_OTHER): Payer: Self-pay | Admitting: Nurse Practitioner

## 2022-06-23 DIAGNOSIS — E876 Hypokalemia: Secondary | ICD-10-CM

## 2022-06-23 NOTE — Telephone Encounter (Signed)
Please call the patient to schedule an appointment for follow-up for med management.

## 2022-06-24 NOTE — Progress Notes (Signed)
Remote ICD transmission.   

## 2022-06-29 ENCOUNTER — Other Ambulatory Visit: Payer: Self-pay | Admitting: Cardiology

## 2022-07-07 ENCOUNTER — Telehealth: Payer: Self-pay | Admitting: Pharmacist

## 2022-07-07 MED ORDER — MOUNJARO 7.5 MG/0.5ML ~~LOC~~ SOAJ: 7.5000 mg | SUBCUTANEOUS | 0 refills | Status: DC

## 2022-07-07 NOTE — Telephone Encounter (Signed)
Called pt to f/u with [facility]. A little heartburn, Tums helped. Has noticed decrease in appetite, has to make himself eat. Would like to increase dose for next month. Rx sent in for 7.'5mg'$ .

## 2022-07-19 ENCOUNTER — Ambulatory Visit: Payer: BC Managed Care – PPO | Attending: Cardiology

## 2022-07-19 DIAGNOSIS — I5022 Chronic systolic (congestive) heart failure: Secondary | ICD-10-CM | POA: Diagnosis not present

## 2022-07-19 DIAGNOSIS — Z95810 Presence of automatic (implantable) cardiac defibrillator: Secondary | ICD-10-CM

## 2022-07-23 NOTE — Progress Notes (Signed)
EPIC Encounter for ICM Monitoring  Patient Name: Gary Williamson is a 57 y.o. male Date: 07/23/2022 Primary Care Physican: [name], NP Primary Cardiologist: [name] Electrophysiologist: Marisa Sprinkles Pacing: >99%     04/05/2022 Office Weight: 342 lbs   AT/AF Burden: 0% (taking Eliquis)     Transmission reviewed.     Corvue Thoracic impedance normal but was suggesting intermittent days with possible fluid accumulation.     Prescribed: Torsemide 20 mg take 2 tablets (40 mg total) daily. Potassium 20 mEq take 1 tablet daily Spironolactone 25 mg take 1 tablet daily   Labs: 04/06/2023 Creatinine 1.24, BUN 14, Potassium 3.6, Sodium 141, GFR >60 01/25/2022 Creatinine 1.27, BUN 18, Potassium 3.7, Sodium 141, GFR >60 11/17/2021 Creatinine 1.22, BUN 17, Potassium 3.5, Sodium 143, GFR 69 11/11/2021 Creatinine 1.29, BUN 19, Potassium 3.4, Sodium 141, GFR 65 08/25/2021 Creatinine 1.26, BUN 15, Potassium 3.6, Sodium 146, GFR 67 08/12/2021 Creatinine 1.26, BUN 19, Potassium 3.6, Sodium 145, GFR 67 A complete set of results can be found in Results Review.   Recommendations:  No changes.   Follow-up plan: ICM clinic phone appointment on 08/23/2022.   91 day device clinic remote transmission 09/02/2022.     EP/Cardiology Office Visits:   08/05/2022 with HF clinic.   Recall 09/08/2022 with Dr Quentin Ore.   Copy of ICM check sent to Dr. Quentin Ore.    3 month ICM trend: 07/19/2022.    12-14 Month ICM trend:     [name], RN 07/23/2022 3:07 PM

## 2022-08-04 ENCOUNTER — Telehealth: Payer: Self-pay | Admitting: Pharmacist

## 2022-08-04 MED ORDER — MOUNJARO 10 MG/0.5ML ~~LOC~~ SOAJ: 10.0000 mg | SUBCUTANEOUS | 0 refills | Status: DC

## 2022-08-04 NOTE — Progress Notes (Signed)
Advanced Heart Failure Clinic Note    Patient ID: Gary Williamson, male   DOB: [birth year], 57 y.o.   MRN: [id number] PCP: Reginia Forts Cardiology: Dr. [name]  57 y.o. with a h/o nonischemic cardiomyopathy, LBBB, HTN, and NYHA Class II CHF presents for followup of CHF.  Echo in 12/14 showed that his EF remains 20-25%.  He had a St Jude CRT-D system placed in 4/15.  Echo in 12/15 showed EF 25-30%, mild LV dilation, normal RV size and systolic function.    Echo in 7/21 showed EF up to 40-45%, diffuse hypokinesis, normal RV. Echo in 11/22 showed EF 35-40%, mild LV dilation.   Patient had ICD shock in 7/23 due to rapid atrial fibrillation (inappropriate).  In 8/23, he had VT with ATP then shock.  Device clinic was unable to contact him. He had been standing outside in the heat for a long period at a funeral and had not taken his meds that day.  No loss of consciousness or fall.   CPX in 10/23 showed mild functional limitation primarily due to body habitus.    Echo 12/23 showed EF 40%, mild LVH, mild LV dilation, mildly decreased RV systolic function.   He returns for followup of CHF.   No further VT/shocks.  No significant exertional dyspnea.  He is doing some walking for exercise.  Weight is up 4 lbs.  No orthopnea/PND.  No lightheadedness or palpitations.    St Jude device interrogation: stable thoracic impedance, >99% BiV pacing, 21% a-pacing, no AF/VT.   ECG (personally reviewed): NSR with BiV pacing   Labs (8/11): K 4.5, creatinine 1.3, LDL 79, HDL 26 Labs (2/12): K 4.3, creatinine 1.5 Labs (7/12): K 4.5, creatinine 1.2 Labs (12/14): K 4.4, creatinine 1.2, LDL 87, HDL 32 Labs (4/15): K 4.2, creatinine 1.03 Labs (03/25/14) : K 4.9 Creatinine 0.9 Cholesterol 161 TGL 104 HDL 31 LDL 109  Labs (4/16): K 4.4, creatinine 1.12, HCT 42.2, LDL 101, HDL 30 Labs (7/18): TSH mildly elevated, LDL 106, K 4.1, creatinine 0.98 Labs (4/21): K 3.8, creatinine 1.23 Labs (4/23): LDL 114 Labs (7/23): K 3.5,  creatinine 1.22 Labs (9/23): BNP 12, K 3.7, creatinine 1.27 Labs (12/23): K 3.6, creatinine 1.24, LFTs and HFTs normal   Allergies (verified):  No Known Drug Allergies  Past Medical History: 1. Cardiomyopathy: Nonischemic.  He had a left heart cath in 2002 or 2003 with no significant coronary disease per his report (this was done in [place]). It sounds like his CMP has been present since about 2000.  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  Echo (5/11): severe LV dilation with EF < 20%, diffuse hypokinesis, moderate diastolic dysfunction, severe left atrial enlargement.  First hospitalization for CHF was in 5/11.  No drug use.  Adenosine myoview (6/11): EF 21%, diffuse hypokinesis, scar in the anterior and inferior walls and at the apex.  Mild peri-infarct ischemia.  LHC was done (7/11) showing EF 15%, global hypokinesis, LVEDP 22 mmHg, no angiographic CAD.  Echo (10/11): EF 20-25%, severely dilated LV with diffuse hypokinesis, mild MR.  Echo (12/14) with EF 20-25%, moderately dilated LV, diffuse hypokinesis, cannot rule out apical thrombus.  He was unable to tolerate cardiac MRI.  He had St Jude CRT-D implantation in 4/15. Echo (12/15) with EF 25-30%, mild LV dilation, mild LVH, normal RV size and systolic function.  - Echo (7/21): EF 40-45%, diffuse hypokinesis, normal RV.  - Echo (11/22): EF 35-40%, mild  LV dilation. - CPX (10/23): peak VO2 15.7, RER 1.18, VE/VCO2 slope 31.  Mild functional limitation primarily due to obesity/deconditioning.  - Echo (12/23): EF 40%, mild LVH, mild LV dilation, mildly decreased RV systolic function.  2. Hypertension 3. Chronic left bundle branch block 4. Hyperlipidemia 5. Atrial fibrillation: Paroxysmal, noted on device interrogation.  6. VT  Family History: Family history is negative for premature diagnosis of coronary artery disease or any arrhythmia or history of heart failure in first-degree relatives.  Mother and  father do have a history of hypertension  Social History: The patient lives in [place] with his wife and 2 children.   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   Current Outpatient Medications  Medication Sig Dispense Refill   amiodarone (PACERONE) 200 MG tablet Take 200 mg by mouth daily.     apixaban (ELIQUIS) 5 MG TABS tablet Take 1 tablet by mouth twice daily 60 tablet 5   blood glucose meter kit and supplies Dispense based on patient and insurance preference. Check blood sugar every morning before eating and up to three additional times a day as needed (FOR ICD-10 E10.9, E11.9). 1 each 0   Cholecalciferol (VITAMIN D) 2000 units CAPS Take 2,000 Units by mouth daily.     dapagliflozin propanediol (FARXIGA) 10 MG TABS tablet TAKE 1 TABLET BY MOUTH ONCE DAILY . APPOINTMENT REQUIRED FOR FUTURE REFILLS 90 tablet 0   fish oil-omega-3 fatty acids 1000 MG capsule Take 1 capsule (1 g total) by mouth daily. 30 capsule 2   Lancets (ONETOUCH DELICA PLUS 123XX123) MISC USE 1 LANCET FOR FINGERSTICK TO CHECK GLUCOSE IN THE MORNING BEFORE  EATING  AND  UP  TO  3  ADDITIONAL  TIMES  PER  DAY  AS  NEEDED 100 each 0   metFORMIN (GLUCOPHAGE) 500 MG tablet TAKE 2 TABLETS BY MOUTH TWICE DAILY BEFORE BREAKFAST AND  SUPPER 120 tablet 0   metoprolol succinate (TOPROL-XL) 100 MG 24 hr tablet Take 1 tablet (100 mg total) by mouth 2 (two) times daily. Take two tablets by mouth in the morning and one tablet at night 180 tablet 3   Multiple Vitamin (MULTIVITAMIN) tablet Take 1 tablet by mouth daily.     ONETOUCH VERIO test strip USE 1 STRIP TO CHECK GLUCOSE ONCE DAILY IN THE MORNING BEFORE EATING AND UP TO THREE ADDITIONAL TIMES DAILY AS NEEDED 100 each 0   potassium chloride SA (KLOR-CON M) 20  MEQ tablet Take 1 tablet by mouth once daily 30 tablet 0   rosuvastatin (CRESTOR) 20 MG tablet Take 2 tabs (40mg ) by mouth Monday, Wednesday, and Friday. Take 1 tab (20mg ) by mouth Sunday, Tuesday, Thursday, and Saturday. For cholesterol. 138 tablet 3   sacubitril-valsartan (ENTRESTO) 97-103 MG Take 1 tablet by mouth 2 (two) times daily. 180 tablet 3   spironolactone (ALDACTONE) 25 MG tablet Take 1 tablet (25 mg total) by mouth daily. 90 tablet 3   tirzepatide (MOUNJARO) 10 MG/0.5ML Pen Inject 10 mg into the skin once a week. 2 mL 0   torsemide (DEMADEX) 20 MG tablet Take 2 tablets by mouth once daily 180 tablet 2   No current facility-administered medications for this visit.    There were no vitals taken  for this visit. PHYSICAL EXAM: General:  Well appearing. No respiratory difficulty HEENT: normal Neck: supple. no JVD. Carotids 2+ bilat; no bruits. No lymphadenopathy or thyromegaly appreciated. Cor: PMI nondisplaced. Regular rate & rhythm. No rubs, gallops or murmurs. Lungs: clear Abdomen: soft, nontender, nondistended. No hepatosplenomegaly. No bruits or masses. Good bowel sounds. Extremities: no cyanosis, clubbing, rash, edema Neuro: alert & oriented x 3, cranial nerves grossly intact. moves all 4 extremities w/o difficulty. Affect pleasant.   Assessment/Plan: 1. Nonischemic cardiomyopathy: Most recent echo 12/23: EF 40% (stable), mild LVH, mild LV dilation, mildly decreased RV systolic function. CPX in 10/23 showed mild functional limitation primarily due to body habitus. He has a [employer] CRT-D system. Device interrogation today demonstrates  NYHA Class ***. Volume ***  - Continue torsemide 40 mg daily.   - Continue Toprol XL 100 mg bid.  - Continue Entresto 97/103 bid  - Continue Spironolactone 25 mg daily.  - Continue dapagliflozin 10 mg daily.  - Did not tolerate Bidil. Stopped due to HAs  2. Obesity: Needs significant weight loss.    - Now on tirzepatide Darcel Bayley), followed  by PharmD clinic  3. Hyperlipidemia: Continue Crestor. 4. Atrial fibrillation: Paroxysmal.  - Continue apixaban 5 mg bid. No bleeding issues. Check CBC  5. VT: Episode in 8/23.  He had 2 shocks, 1 inappropriate due to AF and the other appropriate. Not associated with chest pain, doubt ACS.   - Continue Toprol XL.   - Continue amiodarone 200 mg daily.  Check LFTs and TSH today, needs regular eye exam.  - followed by EP   Followup w/ Dr. [name] in 4 months   Lyda Jester, PA-C  08/04/2022

## 2022-08-04 NOTE — Telephone Encounter (Signed)
Called pt to f/u with Mounjaro tolerability. Appetite down, still working on eating only when he's hungry. Ready to increase dose to 10mg  weekly, new rx sent in.

## 2022-08-05 ENCOUNTER — Telehealth (HOSPITAL_COMMUNITY): Payer: Self-pay | Admitting: Cardiology

## 2022-08-05 ENCOUNTER — Ambulatory Visit (HOSPITAL_COMMUNITY)
Admission: RE | Admit: 2022-08-05 | Discharge: 2022-08-05 | Disposition: A | Discharge status: Home / Self Care | Payer: BC Managed Care – PPO | Source: Ambulatory Visit | Attending: Cardiology | Admitting: Cardiology

## 2022-08-05 ENCOUNTER — Encounter (HOSPITAL_COMMUNITY): Payer: Self-pay

## 2022-08-05 VITALS — BP 124/90 | HR 82 | Wt 347.8 lb

## 2022-08-05 DIAGNOSIS — I110 Hypertensive heart disease with heart failure: Secondary | ICD-10-CM | POA: Insufficient documentation

## 2022-08-05 DIAGNOSIS — I5022 Chronic systolic (congestive) heart failure: Secondary | ICD-10-CM

## 2022-08-05 DIAGNOSIS — Z79899 Other long term (current) drug therapy: Secondary | ICD-10-CM | POA: Insufficient documentation

## 2022-08-05 DIAGNOSIS — Z4502 Encounter for adjustment and management of automatic implantable cardiac defibrillator: Secondary | ICD-10-CM | POA: Insufficient documentation

## 2022-08-05 DIAGNOSIS — Z95810 Presence of automatic (implantable) cardiac defibrillator: Secondary | ICD-10-CM | POA: Insufficient documentation

## 2022-08-05 DIAGNOSIS — Z8249 Family history of ischemic heart disease and other diseases of the circulatory system: Secondary | ICD-10-CM | POA: Insufficient documentation

## 2022-08-05 DIAGNOSIS — I480 Paroxysmal atrial fibrillation: Secondary | ICD-10-CM | POA: Diagnosis not present

## 2022-08-05 DIAGNOSIS — E669 Obesity, unspecified: Secondary | ICD-10-CM | POA: Diagnosis not present

## 2022-08-05 DIAGNOSIS — Z7901 Long term (current) use of anticoagulants: Secondary | ICD-10-CM | POA: Diagnosis not present

## 2022-08-05 DIAGNOSIS — E1165 Type 2 diabetes mellitus with hyperglycemia: Secondary | ICD-10-CM

## 2022-08-05 DIAGNOSIS — E785 Hyperlipidemia, unspecified: Secondary | ICD-10-CM | POA: Diagnosis not present

## 2022-08-05 DIAGNOSIS — E876 Hypokalemia: Secondary | ICD-10-CM

## 2022-08-05 DIAGNOSIS — Z6841 Body Mass Index (BMI) 40.0 and over, adult: Secondary | ICD-10-CM | POA: Diagnosis not present

## 2022-08-05 DIAGNOSIS — I10 Essential (primary) hypertension: Secondary | ICD-10-CM

## 2022-08-05 DIAGNOSIS — I4891 Unspecified atrial fibrillation: Secondary | ICD-10-CM | POA: Diagnosis not present

## 2022-08-05 DIAGNOSIS — I447 Left bundle-branch block, unspecified: Secondary | ICD-10-CM | POA: Insufficient documentation

## 2022-08-05 DIAGNOSIS — I428 Other cardiomyopathies: Secondary | ICD-10-CM

## 2022-08-05 DIAGNOSIS — E782 Mixed hyperlipidemia: Secondary | ICD-10-CM

## 2022-08-05 LAB — COMPREHENSIVE METABOLIC PANEL
ALT: 26 U/L (ref 0–44)
AST: 24 U/L (ref 15–41)
Albumin: 3.7 g/dL (ref 3.5–5.0)
Alkaline Phosphatase: 62 U/L (ref 38–126)
Anion gap: 11 (ref 5–15)
BUN: 12 mg/dL (ref 6–20)
CO2: 27 mmol/L (ref 22–32)
Calcium: 8.9 mg/dL (ref 8.9–10.3)
Chloride: 104 mmol/L (ref 98–111)
Creatinine, Ser: 1.55 mg/dL — ABNORMAL HIGH (ref 0.61–1.24)
GFR, Estimated: 52 mL/min — ABNORMAL LOW (ref >60)
Glucose, Bld: 101 mg/dL — ABNORMAL HIGH (ref 70–99)
Potassium: 3.4 mmol/L — ABNORMAL LOW (ref 3.5–5.1)
Sodium: 142 mmol/L (ref 135–145)
Total Bilirubin: 0.6 mg/dL (ref 0.3–1.2)
Total Protein: 6.9 g/dL (ref 6.5–8.1)

## 2022-08-05 LAB — CBC
HCT: 44.2 % (ref 39.0–52.0)
Hemoglobin: 14.3 g/dL (ref 13.0–17.0)
MCH: 29.3 pg (ref 26.0–34.0)
MCHC: 32.4 g/dL (ref 30.0–36.0)
MCV: 90.6 fL (ref 80.0–100.0)
Platelets: 256 10*3/uL (ref 150–400)
RBC: 4.88 MIL/uL (ref 4.22–5.81)
RDW: 14.6 % (ref 11.5–15.5)
WBC: 9.4 10*3/uL (ref 4.0–10.5)
nRBC: 0 % (ref 0.0–0.2)

## 2022-08-05 LAB — LIPID PANEL
Cholesterol: 133 mg/dL (ref 0–200)
HDL: 32 mg/dL — ABNORMAL LOW (ref >40)
LDL Cholesterol: 75 mg/dL (ref 0–99)
Total CHOL/HDL Ratio: 4.2 RATIO
Triglycerides: 129 mg/dL (ref <150)
VLDL: 26 mg/dL (ref 0–40)

## 2022-08-05 LAB — MAGNESIUM: Magnesium: 2.1 mg/dL (ref 1.7–2.4)

## 2022-08-05 LAB — TSH: TSH: 3.437 u[IU]/mL (ref 0.350–4.500)

## 2022-08-05 MED ORDER — POTASSIUM CHLORIDE CRYS ER 20 MEQ PO TBCR: 20.0000 meq | EXTENDED_RELEASE_TABLET | Freq: Every day | ORAL | 3 refills | Status: DC

## 2022-08-05 MED ORDER — APIXABAN 5 MG PO TABS: 5.0000 mg | ORAL_TABLET | Freq: Two times a day (BID) | ORAL | 3 refills | Status: DC

## 2022-08-05 MED ORDER — AMIODARONE HCL 200 MG PO TABS: 200.0000 mg | ORAL_TABLET | Freq: Every day | ORAL | 3 refills | Status: DC

## 2022-08-05 MED ORDER — DAPAGLIFLOZIN PROPANEDIOL 10 MG PO TABS: 10.0000 mg | ORAL_TABLET | Freq: Every day | ORAL | 3 refills | Status: DC

## 2022-08-05 MED ORDER — ROSUVASTATIN CALCIUM 20 MG PO TABS: ORAL_TABLET | ORAL | 3 refills | Status: DC

## 2022-08-05 MED ORDER — POTASSIUM CHLORIDE CRYS ER 20 MEQ PO TBCR: 40.0000 meq | EXTENDED_RELEASE_TABLET | Freq: Every day | ORAL | 3 refills | Status: DC

## 2022-08-05 NOTE — Addendum Note (Signed)
Encounter addended by: Shonna Chock, CMA on: [id number] 10:41 AM  Actions taken: Charge Capture section accepted

## 2022-08-05 NOTE — Telephone Encounter (Signed)
-----   Message from Consuelo Pandy, [place] sent at 08/05/2022  4:54 PM EDT ----- K low. Increase KCl to 40 meQ daily. Repeat BMP in 7 days

## 2022-08-05 NOTE — Patient Instructions (Signed)
EKG done today.  Labs done today. We will contact you only if your labs are abnormal.  No medication changes were made. Please continue all current medications as prescribed.  Your physician recommends that you schedule a follow-up appointment in: 4 months with Dr. [name]. Please contact our office in July to schedule a August appointment.   If you have any questions or concerns before your next appointment please send [name] a message through [place] or call our office at [phone].    TO LEAVE A MESSAGE FOR THE NURSE SELECT OPTION 2, PLEASE LEAVE A MESSAGE INCLUDING: YOUR NAME DATE OF BIRTH CALL BACK NUMBER REASON FOR CALL**this is important as we prioritize the call backs  YOU WILL RECEIVE A CALL BACK THE SAME DAY AS LONG AS YOU CALL BEFORE 4:00 PM   Do the following things EVERYDAY: Weigh yourself in the morning before breakfast. Write it down and keep it in a log. Take your medicines as prescribed Eat low salt foods--Limit salt (sodium) to 2000 mg per day.  Stay as active as you can everyday Limit all fluids for the day to less than 2 liters   At the [facility], you and your health needs are our priority. As part of our continuing mission to provide you with exceptional heart care, we have created designated Provider Care Teams. These Care Teams include your primary Cardiologist (physician) and Advanced Practice Providers (APPs- Physician Assistants and Nurse Practitioners) who all work together to provide you with the care you need, when you need it.   You may see any of the following providers on your designated Care Team at your next follow up: Dr Glori Bickers Dr Haynes Kerns, NP Lyda Jester, [place] Audry Riles, PharmD   Please be sure to bring in all your medications bottles to every appointment.

## 2022-08-05 NOTE — Telephone Encounter (Signed)
Patient called.  Patient aware.  

## 2022-08-13 ENCOUNTER — Ambulatory Visit (HOSPITAL_COMMUNITY)
Admission: RE | Admit: 2022-08-13 | Discharge: 2022-08-13 | Disposition: A | Discharge status: Home / Self Care | Payer: BC Managed Care – PPO | Source: Ambulatory Visit | Attending: Cardiology | Admitting: Cardiology

## 2022-08-13 DIAGNOSIS — E876 Hypokalemia: Secondary | ICD-10-CM | POA: Diagnosis present

## 2022-08-13 LAB — BASIC METABOLIC PANEL
Anion gap: 8 (ref 5–15)
BUN: 13 mg/dL (ref 6–20)
CO2: 24 mmol/L (ref 22–32)
Calcium: 8.6 mg/dL — ABNORMAL LOW (ref 8.9–10.3)
Chloride: 108 mmol/L (ref 98–111)
Creatinine, Ser: 1.34 mg/dL — ABNORMAL HIGH (ref 0.61–1.24)
GFR, Estimated: >60 mL/min (ref >60)
Glucose, Bld: 108 mg/dL — ABNORMAL HIGH (ref 70–99)
Potassium: 3.8 mmol/L (ref 3.5–5.1)
Sodium: 140 mmol/L (ref 135–145)

## 2022-08-20 ENCOUNTER — Encounter: Payer: Self-pay | Admitting: Cardiology

## 2022-08-23 ENCOUNTER — Ambulatory Visit: Payer: BC Managed Care – PPO | Attending: Cardiology

## 2022-08-23 DIAGNOSIS — I5022 Chronic systolic (congestive) heart failure: Secondary | ICD-10-CM | POA: Diagnosis not present

## 2022-08-23 DIAGNOSIS — Z95810 Presence of automatic (implantable) cardiac defibrillator: Secondary | ICD-10-CM | POA: Diagnosis not present

## 2022-08-26 ENCOUNTER — Telehealth: Payer: Self-pay | Admitting: Pharmacist

## 2022-08-26 ENCOUNTER — Other Ambulatory Visit: Payer: Self-pay | Admitting: *Deleted

## 2022-08-26 MED ORDER — MOUNJARO 10 MG/0.5ML ~~LOC~~ SOAJ: 10.0000 mg | SUBCUTANEOUS | 0 refills | Status: DC

## 2022-08-26 NOTE — Telephone Encounter (Signed)
Pt due for dose increase soon, called to confirm tolerability of  dose. States he never got it, called [facility], [facility], and [facility] and no one had it in stock. Called [facility] mail order pharmacy who told him they did have it in stock, that's who this refill request came from. Rx sent in. He has been without med for about 2 weeks. Advised him to let me know if mail order can't send out rx either, can keep him on 7.5mg  longer if pharmacy is able to get that dose in stock.

## 2022-08-26 NOTE — Telephone Encounter (Signed)
Pt due for dose increase soon, called to confirm tolerability of 10mg dose. States he never got it, called [facility], and [facility] and no one had it in stock. Called [facility] mail order pharmacy who told him they did have it in stock, that's who this refill request came from. Rx sent in. He has been without med for about 2 weeks. Advised him to let me know if mail order can't send out rx either, can keep him on 7.5mg longer if pharmacy is able to get that dose in stock. 

## 2022-08-27 ENCOUNTER — Ambulatory Visit (INDEPENDENT_AMBULATORY_CARE_PROVIDER_SITE_OTHER): Payer: BC Managed Care – PPO | Admitting: Nurse Practitioner

## 2022-08-27 ENCOUNTER — Encounter: Payer: Self-pay | Admitting: Nurse Practitioner

## 2022-08-27 ENCOUNTER — Encounter (HOSPITAL_BASED_OUTPATIENT_CLINIC_OR_DEPARTMENT_OTHER): Payer: BC Managed Care – PPO | Admitting: Nurse Practitioner

## 2022-08-27 VITALS — BP 126/74 | HR 73 | Ht 73.0 in | Wt 348.2 lb

## 2022-08-27 DIAGNOSIS — D6869 Other thrombophilia: Secondary | ICD-10-CM

## 2022-08-27 DIAGNOSIS — E782 Mixed hyperlipidemia: Secondary | ICD-10-CM

## 2022-08-27 DIAGNOSIS — E1169 Type 2 diabetes mellitus with other specified complication: Secondary | ICD-10-CM

## 2022-08-27 DIAGNOSIS — I428 Other cardiomyopathies: Secondary | ICD-10-CM

## 2022-08-27 DIAGNOSIS — Z0000 Encounter for general adult medical examination without abnormal findings: Secondary | ICD-10-CM | POA: Diagnosis not present

## 2022-08-27 DIAGNOSIS — E559 Vitamin D deficiency, unspecified: Secondary | ICD-10-CM

## 2022-08-27 DIAGNOSIS — E7849 Other hyperlipidemia: Secondary | ICD-10-CM

## 2022-08-27 DIAGNOSIS — I152 Hypertension secondary to endocrine disorders: Secondary | ICD-10-CM

## 2022-08-27 DIAGNOSIS — I4891 Unspecified atrial fibrillation: Secondary | ICD-10-CM

## 2022-08-27 DIAGNOSIS — Z6841 Body Mass Index (BMI) 40.0 and over, adult: Secondary | ICD-10-CM

## 2022-08-27 DIAGNOSIS — E1165 Type 2 diabetes mellitus with hyperglycemia: Secondary | ICD-10-CM

## 2022-08-27 DIAGNOSIS — I5022 Chronic systolic (congestive) heart failure: Secondary | ICD-10-CM | POA: Diagnosis not present

## 2022-08-27 DIAGNOSIS — Z95810 Presence of automatic (implantable) cardiac defibrillator: Secondary | ICD-10-CM

## 2022-08-27 DIAGNOSIS — Z125 Encounter for screening for malignant neoplasm of prostate: Secondary | ICD-10-CM

## 2022-08-27 DIAGNOSIS — E1159 Type 2 diabetes mellitus with other circulatory complications: Secondary | ICD-10-CM

## 2022-08-27 DIAGNOSIS — I10 Essential (primary) hypertension: Secondary | ICD-10-CM

## 2022-08-27 DIAGNOSIS — E785 Hyperlipidemia, unspecified: Secondary | ICD-10-CM

## 2022-08-27 LAB — POCT UA - MICROALBUMIN
Creatinine, POC: 15 mg/dL
Microalbumin Ur, POC: 5 mg/L

## 2022-08-27 LAB — HEMOGLOBIN A1C

## 2022-08-27 LAB — CBC WITH DIFFERENTIAL/PLATELET
Hematocrit: 44.2 % (ref 37.5–51.0)
Hemoglobin: 15.4 g/dL (ref 13.0–17.7)
Lymphs: 23 %
Neutrophils: 66 %

## 2022-08-27 LAB — PSA TOTAL (REFLEX TO FREE)

## 2022-08-27 MED ORDER — MOUNJARO 15 MG/0.5ML ~~LOC~~ SOAJ
15.0000 mg | SUBCUTANEOUS | 1 refills | Status: DC
Start: 2022-08-27 — End: 2023-02-08

## 2022-08-27 MED ORDER — TIRZEPATIDE 12.5 MG/0.5ML ~~LOC~~ SOAJ
12.5000 mg | SUBCUTANEOUS | 0 refills | Status: DC
Start: 2022-08-27 — End: 2022-09-24

## 2022-08-27 NOTE — Progress Notes (Signed)
EPIC Encounter for ICM Monitoring  Patient Name: Gary Williamson is a 57 y.o. male Date: 08/27/2022 Primary Care Physican: Tollie Eth, NP Primary Cardiologist: Shirlee Latch Electrophysiologist: Townsend Roger Pacing: >99%     04/05/2022 Office Weight: 342 lbs   AT/AF Burden: 0% (taking Eliquis)     Transmission reviewed.     [place] Thoracic impedance normal but was suggesting intermittent days with possible fluid accumulation.     Prescribed: Torsemide 20 mg take 2 tablets (40 mg total) daily. Potassium 20 mEq take 1 tablet daily Spironolactone 25 mg take 1 tablet daily   Labs: 04/06/2023 Creatinine 1.24, BUN 14, Potassium 3.6, Sodium 141, GFR >60 01/25/2022 Creatinine 1.27, BUN 18, Potassium 3.7, Sodium 141, GFR >60 11/17/2021 Creatinine 1.22, BUN 17, Potassium 3.5, Sodium 143, GFR 69 11/11/2021 Creatinine 1.29, BUN 19, Potassium 3.4, Sodium 141, GFR 65 08/25/2021 Creatinine 1.26, BUN 15, Potassium 3.6, Sodium 146, GFR 67 08/12/2021 Creatinine 1.26, BUN 19, Potassium 3.6, Sodium 145, GFR 67 A complete set of results can be found in Results Review.   Recommendations:  No changes.   Follow-up plan: ICM clinic phone appointment on 09/28/2022.   91 day device clinic remote transmission 09/02/2022.     EP/Cardiology Office Visits:   08/05/2022 with HF clinic.   Recall 09/08/2022 with Dr Lalla Brothers.   Copy of ICM check sent to Dr. Lalla Brothers.     3 month ICM trend: 08/23/2022.    12-14 Month ICM trend:     Karie Soda, RN 08/27/2022 3:34 PM

## 2022-08-27 NOTE — Patient Instructions (Signed)
There are no preventive care reminders to display for this patient.   For all adult patients, I recommend A well balanced diet low in saturated fats, cholesterol, and moderation in carbohydrates.   This can be as simple as monitoring portion sizes and cutting back on sugary beverages such as soda and juice to start with.    Daily water consumption of at least 64 ounces.  Physical activity at least 180 minutes per week, if just starting out.   This can be as simple as taking the stairs instead of the elevator and walking 2-3 laps around the office  purposefully every day.   STD protection, partner selection, and regular testing if high risk.  Limited consumption of alcoholic beverages if alcohol is consumed.  For women, I recommend no more than 7 alcoholic beverages per week, spread out throughout the week.  Avoid "binge" drinking or consuming large quantities of alcohol in one setting.   Please let me know if you feel you may need help with reduction or quitting alcohol consumption.   Avoidance of nicotine, if used.  Please let me know if you feel you may need help with reduction or quitting nicotine use.   Daily mental health attention.  This can be in the form of 5 minute daily meditation, prayer, journaling, yoga, reflection, etc.   Purposeful attention to your emotions and mental state can significantly improve your overall wellbeing  and  Health.  Please know that I am here to help you with all of your health care goals and am happy to work with you to find a solution that works best for you.  The greatest advice I have received with any changes in life are to take it one step at a time, that even means if all you can focus on is the next 60 seconds, then do that and celebrate your victories.  With any changes in life, you will have set backs, and that is OK. The important thing to remember is, if you have a set back, it is not a failure, it is an opportunity to try again!  Health  Maintenance Recommendations Screening Testing Mammogram Every 1 -2 years based on history and risk factors Starting at age 40 Pap Smear Ages 21-39 every 3 years Ages 30-65 every 5 years with HPV testing More frequent testing may be required based on results and history Colon Cancer Screening Every 1-10 years based on test performed, risk factors, and history Starting at age 45 Bone Density Screening Every 2-10 years based on history Starting at age 65 for women Recommendations for men differ based on medication usage, history, and risk factors AAA Screening One time ultrasound Men 65-75 years old who have every smoked Lung Cancer Screening Low Dose Lung CT every 12 months Age 55-80 years with a 30 pack-year smoking history who still smoke or who have quit within the last 15 years  Screening Labs Routine  Labs: Complete Blood Count (CBC), Complete Metabolic Panel (CMP), Cholesterol (Lipid Panel) Every 6-12 months based on history and medications May be recommended more frequently based on current conditions or previous results Hemoglobin A1c Lab Every 3-12 months based on history and previous results Starting at age 45 or earlier with diagnosis of diabetes, high cholesterol, BMI >26, and/or risk factors Frequent monitoring for patients with diabetes to ensure blood sugar control Thyroid Panel (TSH w/ T3 & T4) Every 6 months based on history, symptoms, and risk factors May be repeated more often if on medication   HIV One time testing for all patients 13 and older May be repeated more frequently for patients with increased risk factors or exposure Hepatitis C One time testing for all patients 18 and older May be repeated more frequently for patients with increased risk factors or exposure Gonorrhea, Chlamydia Every 12 months for all sexually active persons 13-24 years Additional monitoring may be recommended for those who are considered high risk or who have symptoms PSA Men  40-54 years old with risk factors Additional screening may be recommended from age 55-69 based on risk factors, symptoms, and history  Vaccine Recommendations Tetanus Booster All adults every 10 years Flu Vaccine All patients 6 months and older every year COVID Vaccine All patients 12 years and older Initial dosing with booster May recommend additional booster based on age and health history HPV Vaccine 2 doses all patients age 9-26 Dosing may be considered for patients over 26 Shingles Vaccine (Shingrix) 2 doses all adults 55 years and older Pneumonia (Pneumovax 23) All adults 65 years and older May recommend earlier dosing based on health history Pneumonia (Prevnar 13) All adults 65 years and older Dosed 1 year after Pneumovax 23  Additional Screening, Testing, and Vaccinations may be recommended on an individualized basis based on family history, health history, risk factors, and/or exposure.   

## 2022-08-27 NOTE — Progress Notes (Signed)
BP 126/74   Pulse 73   Ht 6\' 1"  (1.854 m)   Wt (!) 348 lb 3.2 oz (157.9 kg)   BMI 45.94 kg/m    Subjective:    Patient ID: Gary Williamson, male    DOB: [birth year], 57 y.o.   MRN: [id number]  HPI: Gary Williamson is a 57 y.o. male presenting on 08/27/2022 for comprehensive medical examination.   HPI Current medical concerns include:  Gary Williamson reports concerns regarding obtaining the 10 mg dose of Mounjaro, due to the shortages. He reports better blood sugar control with Mounjaro but has not noticed any weight loss. He admits he needs to pay closer to his portion sizing. He mentions experiencing some indigestion and constipation, for which he has taken Tums and is advised to take Colace.  The patient denies any issues with hearing or vision, fullness in the throat, difficulty swallowing, pain or tenderness in his back, chest pain, or shortness of breath. He also denies any swelling in his feet, pain or tenderness in his abdomen, or changes in bladder habits.  The patient is currently taking amiodarone, Eliquis, Farxiga, Crestor, and [facility]. He has stopped taking Metformin. He reports that his potassium levels were within normal limits after increasing his potassium intake. He has not received the shingles vaccine and is advised to plan for it on a day when he can rest the following day. The patient recently had [place] exam with [place] [facility] [place] the mall) and was advised to mention his use of amiodarone while there, they reportedly did not have any concerning findings. We will work to get this record.   Pertinent items are noted in HPI.  IMMUNIZATIONS:   Flu: Flu vaccine postponed until flu season Prevnar 13: Prevnar 13 N/A for this patient Pneumovax 23: Pneumovax 23 N/A for this patient Prevnar 20: Prevnar 20 N/A for this patient HPV: HPV N/A for this patient Vac Shingrix: Shingrix declined, patient will complete at a later date Tetanus: Tetanus completed in the last 10  years Covid-19 vaccine status: Completed vaccines  HEALTH MAINTENANCE: Colon Cancer Screening HM Status: is up to date STI Testing HM Status: is up to date PSA Screen HM Status: was completed today Lung Ca Screen HM Status: is not applicable for this patient AAA Screen HM Status: is not applicable for this patient  He reports regular vision exams q1-5y: Yes  He reports regular dental exams q 61m:  Yes  The patient eats a regular, healthy diet. He endorses exercise and/or activity of: Sedentary  He currently: Marital Status: married Living situation: with family Sexual: monogamous Occupation: HR with Gary [facility]  Most Recent Depression Screen:     08/27/2022    9:44 AM 08/25/2021    8:18 AM 11/27/2020   12:19 PM 08/18/2020    2:14 PM 07/13/2019    9:58 AM  Depression screen PHQ 2/9  Decreased Interest 0 0 0 0 0  Down, Depressed, Hopeless 0 0 0 0 0  PHQ - 2 Score 0 0 0 0 0  Altered sleeping    0   Tired, decreased energy    0   Change in appetite    0   Feeling bad or failure about yourself     0   Trouble concentrating    0   Moving slowly or fidgety/restless    0   Suicidal thoughts    0   PHQ-9 Score    0    Most Recent Anxiety Screen:  08/18/2020    2:15 PM  GAD 7 : Generalized Anxiety Score  Nervous, Anxious, on Edge 0  Control/stop worrying 0  Worry too much - different things 0  Trouble relaxing 0  Restless 0  Easily annoyed or irritable 0  Afraid - awful might happen 0  Total GAD 7 Score 0   Most Recent Falls Screen:    08/27/2022    9:44 AM 09/15/2021    9:59 AM 08/25/2021    8:18 AM 11/27/2020   12:19 PM 08/18/2020    2:14 PM  Fall Risk   Falls in the past year? 0 0 0 0 0  Number falls in past yr: 0 0 0 0 0  Injury with Fall? 0 0 0 0   Risk for fall due to : No Fall Risks No Fall Risks No Fall Risks No Fall Risks No Fall Risks  Follow up Falls evaluation completed Falls evaluation completed;Education provided Education provided;Falls evaluation completed  Falls evaluation completed     Past medical history, surgical history, medications, allergies, family history and social history reviewed with patient today and changes made to appropriate areas of the chart.  Past Medical History:  Past Medical History:  Diagnosis Date   Acute idiopathic pericarditis 03/24/2010   Overview:  Viral Idiopathic Pericarditis    Body mass index (BMI) of 45.0-49.9 in adult [facility]) 08/18/2020   CHF (congestive heart failure) (Gary [facility])    Chronic systolic heart failure ([facility])    Colon cancer screening    Encounter to establish care 08/18/2020   Hematuria, microscopic 05/15/2013   05/15/2013 visit with Gary Gully, MD at [facility]. Normal CT scan. Recommended annual urine for microscopy and serum creatinine x 3 years. 04/20/2013 visit with Gary Gully, MD at [facility].    Hypertension    LBBB (left bundle branch block)    Chronic   Leg edema    NICM (nonischemic cardiomyopathy) ([facility])    Probably nonischemic; LHC in 2002/03 no sig CAD per report (done in [facility]). Cardiomyopathy for 9-10 yrs. 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 Echo 5/11: severe LVE, EF <20%, diff HK, mod dias dys, sev. LAE. Hosp for CHF in 5/11. Ad. MV 6/11: EF 21%, mild per-infart ischemia. LHC (7/11): EF 15%, no CAD, LVEDP 22; Echo (10/11): EF 20-25%;  Echo (12/12):  EF 20%, mod LVH, Gr 1 DD, diff HK, mild LAE   OSA (obstructive sleep apnea)    cpap not  used   Prediabetes 08/18/2020   Pure hypercholesterolemia    Severe obesity ([facility])    Sleep apnea    Vitamin D deficiency    Medications:  Current Outpatient Medications on File Prior to Visit  Medication Sig   amiodarone (PACERONE) 200 MG tablet Take 1 tablet (200 mg total) by mouth daily.   apixaban (ELIQUIS) 5 MG TABS tablet Take 1 tablet (5 mg total) by mouth 2 (two) times daily.   blood glucose meter kit and supplies Dispense based on patient and insurance preference. Check blood sugar every morning  before eating and up to three additional times a day as needed (FOR ICD-10 E10.9, E11.9).   dapagliflozin propanediol (FARXIGA) 10 MG TABS tablet Take 1 tablet (10 mg total) by mouth daily.   fish oil-omega-3 fatty acids 1000 MG capsule Take 1 capsule (1 g total) by mouth daily.   Lancets (ONETOUCH DELICA PLUS LANCET33G) MISC USE 1 LANCET FOR FINGERSTICK TO CHECK GLUCOSE IN THE MORNING BEFORE  EATING  AND  UP  TO  3  ADDITIONAL  TIMES  PER  DAY  AS  NEEDED   metoprolol succinate (TOPROL-XL) 100 MG 24 hr tablet Take 1 tablet (100 mg total) by mouth 2 (two) times daily. Take two tablets by mouth in the morning and one tablet at night   Multiple Vitamin (MULTIVITAMIN) tablet Take 1 tablet by mouth daily.   ONETOUCH VERIO test strip USE 1 STRIP TO CHECK GLUCOSE ONCE DAILY IN THE MORNING BEFORE EATING AND UP TO THREE ADDITIONAL TIMES DAILY AS NEEDED   potassium chloride SA (KLOR-CON M) 20 MEQ tablet Take 2 tablets (40 mEq total) by mouth daily.   rosuvastatin (CRESTOR) 20 MG tablet Take 2 tabs (40mg ) by mouth Monday, Wednesday, and Friday. Take 1 tab (20mg ) by mouth Sunday, Tuesday, Thursday, and Saturday. For cholesterol.   sacubitril-valsartan (ENTRESTO) 97-103 MG Take 1 tablet by mouth 2 (two) times daily.   spironolactone (ALDACTONE) 25 MG tablet Take 1 tablet (25 mg total) by mouth daily.   tirzepatide (MOUNJARO) 10 MG/0.5ML Pen Inject 10 mg into the skin once a week.   torsemide (DEMADEX) 20 MG tablet Take 2 tablets by mouth once daily   No current facility-administered medications on file prior to visit.   Surgical History:  Past Surgical History:  Procedure Laterality Date   BI-VENTRICULAR IMPLANTABLE CARDIOVERTER DEFIBRILLATOR N/A 08/21/2013   Procedure: BI-VENTRICULAR IMPLANTABLE CARDIOVERTER DEFIBRILLATOR  (CRT-D);  Surgeon: Gardiner Rhyme, MD;  Location: [facility] CATH LAB;  Service: Cardiovascular;  Laterality: N/A;   BIV ICD GENERATOR CHANGEOUT N/A 08/21/2021   Procedure: BIV ICD GENERATOR  CHANGEOUT;  Surgeon: Hillis Range, MD;  Location: [facility] INVASIVE CV LAB;  Service: Cardiovascular;  Laterality: N/A;   CARDIAC CATHETERIZATION  2002 or 2003   COLONOSCOPY N/A 09/16/2014   Procedure: COLONOSCOPY;  Surgeon: Hilarie Fredrickson, MD;  Location: [facility] ENDOSCOPY;  Service: Endoscopy;  Laterality: N/A;   COLONOSCOPY     LEFT HEART CATH  11/13/2009   Dr Shirlee Latch   POLYPECTOMY     Allergies:  No Known Allergies Social History:  Social History   Socioeconomic History   Marital status: Married    Spouse name: Felicia   Number of children: 2   Years of education: Not on file   Highest education level: Not on file  Occupational History   Occupation: Interior and spatial designer of Presenter, broadcasting for [employer]: OTHER    Comment: Full-time    Employer: [employer]  Tobacco Use   Smoking status: Never   Smokeless tobacco: Never  Substance and Sexual Activity   Alcohol use: Yes    Alcohol/week: 0.0 - 2.0 standard drinks of alcohol    Comment: rare to occ.   Drug use: No   Sexual activity: Yes    Birth control/protection: Condom  Other Topics Concern   Not on file  Social History Narrative   Marital status: married x 17 years; happily married      Children: 2 children (15,12)      Lives in [place] with his wife and 2 children      Employment: [employer]; Chiropodist x 12 years; likes work.  Lots of traveling.       Tobacco: none      Alcohol:  1 drink per week      Exercise:  Walking three days per week; 20 minutes.       Seatbelt: 100%   Social Determinants of Corporate investment banker Strain: Not on file  Food  Insecurity: Not on file  Transportation Needs: No Transportation Needs (07/17/2019)   PRAPARE - Administrator, Civil Service (Medical): No    Lack of Transportation (Non-Medical): No  Physical Activity: Insufficiently Active (07/17/2019)   Exercise Vital Sign    Days of Exercise per Week: 2 days    Minutes of Exercise per Session: 20 min  Stress: Not on file  Social  Connections: Not on file  Intimate Partner Violence: Not on file   Social History   Tobacco Use  Smoking Status Never  Smokeless Tobacco Never   Social History   Substance and Sexual Activity  Alcohol Use Yes   Alcohol/week: 0.0 - 2.0 standard drinks of alcohol   Comment: rare to occ.   Family History:  Family History  Problem Relation Age of Onset   Hypertension Mother    Alzheimer's disease Mother    Hyperlipidemia Mother    Breast cancer Mother    Arthritis Father    Dementia Father    Hypertension Father    Alzheimer's disease Father    COPD Brother    Stroke Maternal Grandmother    Coronary artery disease Neg Hx    Heart failure Neg Hx    Heart disease Neg Hx    Colon cancer Neg Hx    Colon polyps Neg Hx    Esophageal cancer Neg Hx    Rectal cancer Neg Hx    Stomach cancer Neg Hx        Objective:    BP 126/74   Pulse 73   Ht 6\' 1"  (1.854 m)   Wt (!) 348 lb 3.2 oz (157.9 kg)   BMI 45.94 kg/m   Wt Readings from Last 3 Encounters:  08/27/22 (!) 348 lb 3.2 oz (157.9 kg)  08/05/22 (!) 347 lb 12.8 oz (157.8 kg)  04/09/22 (!) 346 lb (156.9 kg)    Physical Exam Vitals and nursing note reviewed.  Constitutional:      Appearance: Normal appearance. He is obese.  HENT:     Head: Normocephalic and atraumatic.     Right Ear: Tympanic membrane normal.     Left Ear: Tympanic membrane normal.     Nose: Nose normal.     Mouth/Throat:     Mouth: Mucous membranes are moist.     Pharynx: Oropharynx is clear.  Eyes:     Extraocular Movements: Extraocular movements intact.     Pupils: Pupils are equal, round, and reactive to light.  Neck:     Vascular: No carotid bruit.  Cardiovascular:     Rate and Rhythm: Normal rate. Rhythm irregular.     Pulses: Normal pulses.     Heart sounds: Normal heart sounds.  Pulmonary:     Effort: Pulmonary effort is normal.     Breath sounds: Normal breath sounds.  Abdominal:     General: Bowel sounds are normal. There is no  distension.     Palpations: Abdomen is soft.     Tenderness: There is no abdominal tenderness. There is no right CVA tenderness, left CVA tenderness or guarding.  Musculoskeletal:        General: Normal range of motion.     Cervical back: Normal range of motion and neck supple. No tenderness.     Right lower leg: No edema.     Left lower leg: No edema.  Lymphadenopathy:     Cervical: No cervical adenopathy.  Skin:    General: Skin is warm and dry.  Capillary Refill: Capillary refill takes less than 2 seconds.  Neurological:     General: No focal deficit present.     Mental Status: He is alert.     Motor: No weakness.     Gait: Gait normal.  Psychiatric:        Mood and Affect: Mood normal.     Results for orders placed or performed in visit on 08/27/22  Hemoglobin A1c  Result Value Ref Range   Hgb A1c MFr Bld 5.9 (H) 4.8 - 5.6 %   Est. average glucose Bld gHb Est-mCnc 123 mg/dL  CBC with Differential/Platelet  Result Value Ref Range   WBC 8.5 3.4 - 10.8 x10E3/uL   RBC 5.16 4.14 - 5.80 x10E6/uL   Hemoglobin 15.4 13.0 - 17.7 g/dL   Hematocrit 29.5 28.4 - 51.0 %   MCV 86 79 - 97 fL   MCH 29.8 26.6 - 33.0 pg   MCHC 34.8 31.5 - 35.7 g/dL   RDW 13.2 44.0 - 10.2 %   Platelets 248 150 - 450 x10E3/uL   Neutrophils 66 Not Estab. %   Lymphs 23 Not Estab. %   Monocytes 8 Not Estab. %   Eos 2 Not Estab. %   Basos 1 Not Estab. %   Neutrophils Absolute 5.6 1.4 - 7.0 x10E3/uL   Lymphocytes Absolute 2.0 0.7 - 3.1 x10E3/uL   Monocytes Absolute 0.6 0.1 - 0.9 x10E3/uL   EOS (ABSOLUTE) 0.2 0.0 - 0.4 x10E3/uL   Basophils Absolute 0.1 0.0 - 0.2 x10E3/uL   Immature Granulocytes 0 Not Estab. %   Immature Grans (Abs) 0.0 0.0 - 0.1 x10E3/uL  Lipid panel  Result Value Ref Range   Cholesterol, Total 163 100 - 199 mg/dL   Triglycerides 725 0 - 149 mg/dL   HDL 38 (L) >36 mg/dL   VLDL Cholesterol Cal 25 5 - 40 mg/dL   LDL Chol Calc (NIH) 644 (H) 0 - 99 mg/dL   Chol/HDL Ratio 4.3 0.0 -  5.0 ratio  VITAMIN D 25 Hydroxy (Vit-D Deficiency, Fractures)  Result Value Ref Range   Vit D, 25-Hydroxy 17.9 (L) 30.0 - 100.0 ng/mL  PSA Total (Reflex To Free)  Result Value Ref Range   Prostate Specific Ag, Serum 0.8 0.0 - 4.0 ng/mL   Reflex Criteria Comment   POCT UA - Microalbumin  Result Value Ref Range   Microalbumin Ur, POC 5.0 mg/L   Creatinine, POC 15.0 mg/dL   Albumin/Creatinine Ratio, Urine, POC        Assessment & Plan:   Problem List Items Addressed This Visit     NICM (nonischemic cardiomyopathy) ([facility])    Chronic. No alarm symptoms are present at this time. Recommend close monitoring and increase in physical activity slowly and steadily to reduce risks of exacerbation.       Relevant Medications   tirzepatide (MOUNJARO) 12.5 MG/0.5ML Pen   tirzepatide (MOUNJARO) 15 MG/0.5ML Pen   Other Relevant Orders   Hemoglobin A1c (Completed)   CBC with Differential/Platelet (Completed)   Lipid panel (Completed)   VITAMIN D 25 Hydroxy (Vit-D Deficiency, Fractures) (Completed)   Hyperlipidemia associated with type 2 diabetes mellitus (Gary Williamson)    Chronic. Currently on statin therapy and mounjaro for blood sugar and cardiovascular control. No symptoms present. Labs pending.       Relevant Medications   tirzepatide (MOUNJARO) 12.5 MG/0.5ML Pen   tirzepatide (MOUNJARO) 15 MG/0.5ML Pen   Class 3 severe obesity with serious comorbidity and body mass index (BMI) of  40.0 to 44.9 in adult [facility])    Morbid obesity with multiple co-morbidities present. He is doing very well on maintaining control of his current conditions at this time. I do recommend slow increase in physical activity to help with weight management to reduce risks on health.       Relevant Medications   tirzepatide (MOUNJARO) 12.5 MG/0.5ML Pen   tirzepatide (MOUNJARO) 15 MG/0.5ML Pen   Other Relevant Orders   Hemoglobin A1c (Completed)   CBC with Differential/Platelet (Completed)   Lipid panel (Completed)   VITAMIN  D 25 Hydroxy (Vit-D Deficiency, Fractures) (Completed)   Type 2 diabetes mellitus with hyperglycemia, without long-term current use of insulin (Gary Williamson)    Chronic diabetes. Well controlled at this time. Currently managed with mounjaro and farxiga. Labs pending. Will send increased doses of mounjaro for patient to escalate dosing to maximum effective dose for optimal control, weight management, and cardiovascular protection.       Relevant Medications   tirzepatide (MOUNJARO) 12.5 MG/0.5ML Pen   tirzepatide (MOUNJARO) 15 MG/0.5ML Pen   Other Relevant Orders   Hemoglobin A1c (Completed)   CBC with Differential/Platelet (Completed)   Lipid panel (Completed)   VITAMIN D 25 Hydroxy (Vit-D Deficiency, Fractures) (Completed)   POCT UA - Microalbumin (Completed)   Chronic systolic heart failure ([facility])    Chronic CHF with no signs of exacerbation at this time. BP well controlled, HR controlled, no wheezing or edema present. He is closely followed with cardiology. Plan: - Labs today - Continue current management, avoid excessive sodium and monitor for swelling or shortness of breath.       Relevant Medications   tirzepatide (MOUNJARO) 12.5 MG/0.5ML Pen   tirzepatide (MOUNJARO) 15 MG/0.5ML Pen   Other Relevant Orders   Hemoglobin A1c (Completed)   CBC with Differential/Platelet (Completed)   Lipid panel (Completed)   VITAMIN D 25 Hydroxy (Vit-D Deficiency, Fractures) (Completed)   Hypertension associated with type 2 diabetes mellitus ([facility])    Chronic. Blood pressures are well controlled today. No alarm symptoms are present. He is tolerating his medications well.  Plan: - labs today - continue current medication regimen. - follow closely with cardiology - if home blood pressures are exceeding 130/80, please let us know.      Relevant Medications   tirzepatide (MOUNJARO) 12.5 MG/0.5ML Pen   tirzepatide (MOUNJARO) 15 MG/0.5ML Pen   Atrial fibrillation ([facility])    Chronic afib, currently on  anticoagulation. HR controlled today. No alarm symptoms present. Plan to continue eliquis and monitor closely. He is not having any excessive bruising or signs of bleeding.  Plan: - Labs today - Continue eliquis - notify immediately if bleeding or excessive bruising starts        Relevant Medications   tirzepatide (MOUNJARO) 12.5 MG/0.5ML Pen   tirzepatide (MOUNJARO) 15 MG/0.5ML Pen   Other Relevant Orders   Hemoglobin A1c (Completed)   CBC with Differential/Platelet (Completed)   Lipid panel (Completed)   VITAMIN D 25 Hydroxy (Vit-D Deficiency, Fractures) (Completed)   Acquired thrombophilia ([facility])   ICD (implantable cardioverter-defibrillator) in place   Vitamin D deficiency   Relevant Medications   tirzepatide (MOUNJARO) 12.5 MG/0.5ML Pen   tirzepatide (MOUNJARO) 15 MG/0.5ML Pen   Other Relevant Orders   Hemoglobin A1c (Completed)   CBC with Differential/Platelet (Completed)   Lipid panel (Completed)   VITAMIN D 25 Hydroxy (Vit-D Deficiency, Fractures) (Completed)   Other Visit Diagnoses     Encounter for annual physical exam    -  Primary   Relevant Medications   tirzepatide (MOUNJARO) 12.5 MG/0.5ML Pen   tirzepatide (MOUNJARO) 15 MG/0.5ML Pen   Other Relevant Orders   Hemoglobin A1c (Completed)   CBC with Differential/Platelet (Completed)   Lipid panel (Completed)   VITAMIN D 25 Hydroxy (Vit-D Deficiency, Fractures) (Completed)   PSA Total (Reflex To Free) (Completed)   Other hyperlipidemia       Relevant Medications   tirzepatide (MOUNJARO) 12.5 MG/0.5ML Pen   tirzepatide (MOUNJARO) 15 MG/0.5ML Pen   Other Relevant Orders   Hemoglobin A1c (Completed)   CBC with Differential/Platelet (Completed)   Lipid panel (Completed)   VITAMIN D 25 Hydroxy (Vit-D Deficiency, Fractures) (Completed)   Body mass index (BMI) of 45.0-49.9 in adult [facility])       Relevant Medications   tirzepatide (MOUNJARO) 12.5 MG/0.5ML Pen   tirzepatide (MOUNJARO) 15 MG/0.5ML Pen   Other Relevant  Orders   Hemoglobin A1c (Completed)   CBC with Differential/Platelet (Completed)   Lipid panel (Completed)   VITAMIN D 25 Hydroxy (Vit-D Deficiency, Fractures) (Completed)   Screening for prostate cancer       Relevant Orders   PSA Total (Reflex To Free) (Completed)        Follow up plan: NEXT PREVENTATIVE PHYSICAL DUE IN 1 YEAR. No follow-ups on file.  LABORATORY TESTING:  Health maintenance labs ordered today, if applicable.    PATIENT COUNSELING:   For all adult patients, I recommend A well balanced diet low in saturated fats, cholesterol, and moderation in carbohydrates.  This can be as simple as monitoring portion sizes and cutting back on sugary beverages such as soda and juice to start with.    Daily water consumption of at least 64 ounces.  Physical activity at least 180 minutes per week, if just starting out.  This can be as simple as taking the stairs instead of the elevator and walking 2-3 laps around the office  purposefully every day.   STD protection, partner selection, and regular testing if high risk.  Limited consumption of alcoholic beverages if alcohol is consumed. For men, I recommend no more than 14 alcoholic beverages per week, spread out throughout the week (max 2 per day). Avoid "binge" drinking or consuming large quantities of alcohol in one setting.  Please let me know if you feel you may need help with reduction or quitting alcohol consumption.   Avoidance of nicotine, if used. Please let me know if you feel you may need help with reduction or quitting nicotine use.   Daily mental health attention. This can be in the form of 5 minute daily meditation, prayer, journaling, yoga, reflection, etc.  Purposeful attention to your emotions and mental state can significantly improve your overall wellbeing  and  Health.  Please know that I am here to help you with all of your health care goals and am happy to work with you to find a solution that works  best for you.  The greatest advice I have received with any changes in life are to take it one step at a time, that even means if all you can focus on is the next 60 seconds, then do that and celebrate your victories.  With any changes in life, you will have set backs, and that is OK. The important thing to remember is, if you have a set back, it is not a failure, it is an opportunity to try again!  Health Maintenance Recommendations Screening Testing Mammogram Every 1 -2 years based on history  and risk factors Starting at age 65 Pap Smear Ages 21-39 every 3 years Ages 63-65 every 5 years with HPV testing More frequent testing may be required based on results and history Colon Cancer Screening Every 1-10 years based on test performed, risk factors, and history Starting at age 51 Bone Density Screening Every 2-10 years based on history Starting at age 23 for women Recommendations for men differ based on medication usage, history, and risk factors AAA Screening One time ultrasound Men 55-27 years old who have every smoked Lung Cancer Screening Low Dose Lung CT every 12 months Age 93-80 years with a 30 pack-year smoking history who still smoke or who have quit within the last 15 years   Screening Labs Routine  Labs: Complete Blood Count (CBC), Complete Metabolic Panel (CMP), Cholesterol (Lipid Panel) Every 6-12 months based on history and medications May be recommended more frequently based on current conditions or previous results Hemoglobin A1c Lab Every 3-12 months based on history and previous results Starting at age 52 or earlier with diagnosis of diabetes, high cholesterol, BMI >26, and/or risk factors Frequent monitoring for patients with diabetes to ensure blood sugar control Thyroid Panel (TSH) Every 6 months based on history, symptoms, and risk factors May be repeated more often if on medication HIV One time testing for all patients 29 and older May be repeated more  frequently for patients with increased risk factors or exposure Hepatitis C One time testing for all patients 32 and older May be repeated more frequently for patients with increased risk factors or exposure Gonorrhea, Chlamydia Every 12 months for all sexually active persons 13-24 years Additional monitoring may be recommended for those who are considered high risk or who have symptoms Every 12 months for any woman on birth control, regardless of sexual activity PSA Men 94-4 years old with risk factors Additional screening may be recommended from age 7-69 based on risk factors, symptoms, and history  Vaccine Recommendations Tetanus Booster All adults every 10 years Flu Vaccine All patients 6 months and older every year COVID Vaccine All patients 12 years and older Initial dosing with booster May recommend additional booster based on age and health history HPV Vaccine 2 doses all patients age 72-26 Dosing may be considered for patients over 26 Shingles Vaccine (Shingrix) 2 doses all adults 55 years and older Pneumonia (Pneumovax 8) All adults 65 years and older May recommend earlier dosing based on health history One year apart from Prevnar 48 Pneumonia (Prevnar 67) All adults 65 years and older Dosed 1 year after Pneumovax 23 Pneumonia (Prevnar 20) One time alternative to the two dosing of 13 and 23 For all adults with initial dose of 23, 20 is recommended 1 year later For all adults with initial dose of 13, 23 is still recommended as second option 1 year later  Additional Screening, Testing, and Vaccinations may be recommended on an individualized basis based on family history, health history, risk factors, and/or exposure.

## 2022-08-28 ENCOUNTER — Other Ambulatory Visit: Payer: Self-pay | Admitting: Cardiology

## 2022-08-28 LAB — CBC WITH DIFFERENTIAL/PLATELET
Basophils Absolute: 0.1 10*3/uL (ref 0.0–0.2)
Basos: 1 %
EOS (ABSOLUTE): 0.2 10*3/uL (ref 0.0–0.4)
Eos: 2 %
Immature Grans (Abs): 0 10*3/uL (ref 0.0–0.1)
Immature Granulocytes: 0 %
Lymphocytes Absolute: 2 10*3/uL (ref 0.7–3.1)
MCH: 29.8 pg (ref 26.6–33.0)
MCHC: 34.8 g/dL (ref 31.5–35.7)
MCV: 86 fL (ref 79–97)
Monocytes Absolute: 0.6 10*3/uL (ref 0.1–0.9)
Monocytes: 8 %
Neutrophils Absolute: 5.6 10*3/uL (ref 1.4–7.0)
Platelets: 248 10*3/uL (ref 150–450)
RBC: 5.16 x10E6/uL (ref 4.14–5.80)
RDW: 13.8 % (ref 11.6–15.4)
WBC: 8.5 10*3/uL (ref 3.4–10.8)

## 2022-08-28 LAB — LIPID PANEL
Chol/HDL Ratio: 4.3 ratio (ref 0.0–5.0)
Cholesterol, Total: 163 mg/dL (ref 100–199)
HDL: 38 mg/dL — ABNORMAL LOW (ref >39)
LDL Chol Calc (NIH): 100 mg/dL — ABNORMAL HIGH (ref 0–99)
Triglycerides: 139 mg/dL (ref 0–149)
VLDL Cholesterol Cal: 25 mg/dL (ref 5–40)

## 2022-08-28 LAB — VITAMIN D 25 HYDROXY (VIT D DEFICIENCY, FRACTURES): Vit D, 25-Hydroxy: 17.9 ng/mL — ABNORMAL LOW (ref 30.0–100.0)

## 2022-08-28 LAB — HEMOGLOBIN A1C: Hgb A1c MFr Bld: 5.9 % — ABNORMAL HIGH (ref 4.8–5.6)

## 2022-08-28 LAB — PSA TOTAL (REFLEX TO FREE)

## 2022-09-01 DIAGNOSIS — D6869 Other thrombophilia: Secondary | ICD-10-CM | POA: Insufficient documentation

## 2022-09-01 NOTE — Assessment & Plan Note (Signed)
Chronic CHF with no signs of exacerbation at this time. BP well controlled, HR controlled, no wheezing or edema present. He is closely followed with cardiology. Plan: - Labs today - Continue current management, avoid excessive sodium and monitor for swelling or shortness of breath.

## 2022-09-01 NOTE — Assessment & Plan Note (Addendum)
Chronic. Blood pressures are well controlled today. No alarm symptoms are present. He is tolerating his medications well.  Plan: - labs today - continue current medication regimen. - follow closely with cardiology - if home blood pressures are exceeding 130/80, please let us know.

## 2022-09-01 NOTE — Assessment & Plan Note (Signed)
Chronic afib, currently on anticoagulation. HR controlled today. No alarm symptoms present. Plan to continue eliquis and monitor closely. He is not having any excessive bruising or signs of bleeding.  Plan: - Labs today - Continue eliquis - notify immediately if bleeding or excessive bruising starts

## 2022-09-02 ENCOUNTER — Ambulatory Visit (INDEPENDENT_AMBULATORY_CARE_PROVIDER_SITE_OTHER): Payer: BC Managed Care – PPO

## 2022-09-02 DIAGNOSIS — I428 Other cardiomyopathies: Secondary | ICD-10-CM

## 2022-09-02 LAB — CUP PACEART REMOTE DEVICE CHECK
Battery Remaining Longevity: 79 mo
Battery Remaining Percentage: 84 %
Battery Voltage: 2.98 V
Brady Statistic AP VP Percent: 5.6 %
Brady Statistic AP VS Percent: 1 %
Brady Statistic AS VP Percent: 94 %
Brady Statistic AS VS Percent: 1 %
Brady Statistic RA Percent Paced: 5.5 %
Date Time Interrogation Session: 20240502024616
HighPow Impedance: 64 Ohm
Implantable Lead Connection Status: 753985
Implantable Lead Connection Status: 753985
Implantable Lead Connection Status: 753985
Implantable Lead Implant Date: 20150421
Implantable Lead Implant Date: 20150421
Implantable Lead Implant Date: 20150421
Implantable Lead Location: 753857
Implantable Lead Location: 753859
Implantable Lead Location: 753860
Implantable Pulse Generator Implant Date: 20230421
Lead Channel Impedance Value: 380 Ohm
Lead Channel Impedance Value: 450 Ohm
Lead Channel Impedance Value: 800 Ohm
Lead Channel Pacing Threshold Amplitude: 0.75 V
Lead Channel Pacing Threshold Amplitude: 1 V
Lead Channel Pacing Threshold Amplitude: 1 V
Lead Channel Pacing Threshold Pulse Width: 0.5 ms
Lead Channel Pacing Threshold Pulse Width: 0.5 ms
Lead Channel Pacing Threshold Pulse Width: 0.5 ms
Lead Channel Sensing Intrinsic Amplitude: 4.9 mV
Lead Channel Sensing Intrinsic Amplitude: 5.4 mV
Lead Channel Setting Pacing Amplitude: 2 V
Lead Channel Setting Pacing Amplitude: 2 V
Lead Channel Setting Pacing Amplitude: 2 V
Lead Channel Setting Pacing Pulse Width: 0.5 ms
Lead Channel Setting Pacing Pulse Width: 0.5 ms
Lead Channel Setting Sensing Sensitivity: 0.5 mV
Pulse Gen Serial Number: 210000231

## 2022-09-06 ENCOUNTER — Other Ambulatory Visit: Payer: Self-pay | Admitting: Nurse Practitioner

## 2022-09-06 DIAGNOSIS — E559 Vitamin D deficiency, unspecified: Secondary | ICD-10-CM

## 2022-09-06 MED ORDER — VITAMIN D3 1.25 MG (50000 UT) PO TABS
1.0000 | ORAL_TABLET | ORAL | 3 refills | Status: DC
Start: 2022-09-06 — End: 2023-08-30

## 2022-09-13 NOTE — Assessment & Plan Note (Signed)
Morbid obesity with multiple co-morbidities present. He is doing very well on maintaining control of his current conditions at this time. I do recommend slow increase in physical activity to help with weight management to reduce risks on health.

## 2022-09-13 NOTE — Assessment & Plan Note (Signed)
Chronic. No alarm symptoms are present at this time. Recommend close monitoring and increase in physical activity slowly and steadily to reduce risks of exacerbation.

## 2022-09-13 NOTE — Assessment & Plan Note (Signed)
Chronic. Currently on statin therapy and mounjaro for blood sugar and cardiovascular control. No symptoms present. Labs pending.

## 2022-09-13 NOTE — Assessment & Plan Note (Signed)
Chronic diabetes. Well controlled at this time. Currently managed with mounjaro and farxiga. Labs pending. Will send increased doses of mounjaro for patient to escalate dosing to maximum effective dose for optimal control, weight management, and cardiovascular protection.

## 2022-09-22 NOTE — Progress Notes (Signed)
Remote ICD transmission.   

## 2022-09-23 ENCOUNTER — Telehealth: Payer: Self-pay | Admitting: Pharmacist

## 2022-09-23 NOTE — Telephone Encounter (Signed)
Called pt and left message to follow up with [facility].  Last month was having trouble finding rx at pharmacy. Looks like PCP has sent in a few doses of Mounjaro - the 10mg , 12.5mg , and 15mg . Will need to clarify what dose pt is taking and can titrate as tolerated.

## 2022-09-24 NOTE — Telephone Encounter (Signed)
Called pt again, he just took 2nd dose of 10mg  last week. Mail order sent him a 1 month supply of 12.5mg  and a 3 month supply of the 15mg  dose. Occasional indigestion that he takes Tums for but overall tolerating well. He is aware to increase to 12.5mg  dose once he finishes up his 10mg  dose, and then increase to 15mg  maintenance dose after that. He will call with any concerns or tolerability issues.

## 2022-09-28 ENCOUNTER — Ambulatory Visit: Payer: BC Managed Care – PPO | Attending: Cardiology

## 2022-09-28 DIAGNOSIS — Z95810 Presence of automatic (implantable) cardiac defibrillator: Secondary | ICD-10-CM | POA: Diagnosis not present

## 2022-09-28 DIAGNOSIS — I5022 Chronic systolic (congestive) heart failure: Secondary | ICD-10-CM

## 2022-10-01 NOTE — Progress Notes (Signed)
EPIC Encounter for ICM Monitoring  Patient Name: Gary Williamson is a 57 y.o. male Date: 10/01/2022 Primary Care Physican: Tollie Eth, NP Primary Cardiologist: Shirlee Latch Electrophysiologist: Townsend Roger Pacing: >99%     04/05/2022 Office Weight: 342 lbs   AT/AF Burden: 0% (taking Eliquis)     Transmission reviewed.     Corvue Thoracic impedance normal but was suggesting intermittent days with possible fluid accumulation.     Prescribed: Torsemide 20 mg take 2 tablets (40 mg total) daily. Potassium 20 mEq take 1 tablet daily Spironolactone 25 mg take 1 tablet daily   Labs: 08/13/2022 Creatinine 1.34, BUN 13, Potassium 3.8, Sodium 140, GFR >60 08/05/2022 Creatinine 1.55, BUN 12, Potassium 3.4, Sodium 142, GFR 52 A complete set of results can be found in Results Review.   Recommendations:  No changes.   Follow-up plan: ICM clinic phone appointment on 11/01/2022.   91 day device clinic remote transmission 12/02/2022.     EP/Cardiology Office Visits:   Recall 12/03/2022 with Dr Shirlee Latch.   Recall 09/28/2022 with Dr Lalla Brothers.   Copy of ICM check sent to Dr. Lalla Brothers.     3 month ICM trend: 09/28/2022.    12-14 Month ICM trend:     Karie Soda, RN 10/01/2022 2:54 PM

## 2022-10-04 ENCOUNTER — Other Ambulatory Visit (HOSPITAL_BASED_OUTPATIENT_CLINIC_OR_DEPARTMENT_OTHER): Payer: Self-pay | Admitting: Nurse Practitioner

## 2022-10-04 DIAGNOSIS — E782 Mixed hyperlipidemia: Secondary | ICD-10-CM

## 2022-10-04 DIAGNOSIS — I10 Essential (primary) hypertension: Secondary | ICD-10-CM

## 2022-10-04 DIAGNOSIS — E1165 Type 2 diabetes mellitus with hyperglycemia: Secondary | ICD-10-CM

## 2022-11-01 ENCOUNTER — Ambulatory Visit: Payer: BC Managed Care – PPO | Attending: Cardiology

## 2022-11-01 DIAGNOSIS — I5022 Chronic systolic (congestive) heart failure: Secondary | ICD-10-CM | POA: Diagnosis not present

## 2022-11-01 DIAGNOSIS — Z95810 Presence of automatic (implantable) cardiac defibrillator: Secondary | ICD-10-CM

## 2022-11-05 NOTE — Progress Notes (Signed)
EPIC Encounter for ICM Monitoring  Patient Name: Gary Williamson is a 57 y.o. male Date: 11/05/2022 Primary Care Physican: Tollie Eth, NP Primary Cardiologist: Shirlee Latch Electrophysiologist: Townsend Roger Pacing: >99%     04/05/2022 Office Weight: 342 lbs   AT/AF Burden: 0% (taking Eliquis)     Transmission reviewed.     Corvue Thoracic impedance normal but was suggesting intermittent days with possible fluid accumulation between 6/17 and 6/30.     Prescribed: Torsemide 20 mg take 2 tablets (40 mg total) daily. Potassium 20 mEq take 1 tablet daily Spironolactone 25 mg take 1 tablet daily   Labs: 08/13/2022 Creatinine 1.34, BUN 13, Potassium 3.8, Sodium 140, GFR >60 08/05/2022 Creatinine 1.55, BUN 12, Potassium 3.4, Sodium 142, GFR 52 A complete set of results can be found in Results Review.   Recommendations:  No changes.   Follow-up plan: ICM clinic phone appointment on 12/06/2022.   91 day device clinic remote transmission 12/02/2022.     EP/Cardiology Office Visits:   Recall 12/03/2022 with Dr Shirlee Latch.   Recall 09/28/2022 with Dr Lalla Brothers.   Copy of ICM check sent to Dr. Lalla Brothers.     3 month ICM trend: 11/01/2022.    12-14 Month ICM trend:     Karie Soda, RN 11/05/2022 2:04 PM

## 2022-12-02 ENCOUNTER — Ambulatory Visit: Payer: BC Managed Care – PPO

## 2022-12-02 DIAGNOSIS — I5022 Chronic systolic (congestive) heart failure: Secondary | ICD-10-CM

## 2022-12-02 LAB — CUP PACEART REMOTE DEVICE CHECK
Battery Remaining Longevity: 76 mo
Battery Remaining Percentage: 81 %
Battery Voltage: 2.96 V
Brady Statistic AP VP Percent: 4.1 %
Brady Statistic AP VS Percent: 1 %
Brady Statistic AS VP Percent: 95 %
Brady Statistic AS VS Percent: 1 %
Brady Statistic RA Percent Paced: 4 %
Date Time Interrogation Session: 20240801020153
HighPow Impedance: 74 Ohm
Implantable Lead Connection Status: 753985
Implantable Lead Connection Status: 753985
Implantable Lead Connection Status: 753985
Implantable Lead Implant Date: 20150421
Implantable Lead Implant Date: 20150421
Implantable Lead Implant Date: 20150421
Implantable Lead Location: 753857
Implantable Lead Location: 753859
Implantable Lead Location: 753860
Implantable Pulse Generator Implant Date: 20230421
Lead Channel Impedance Value: 410 Ohm
Lead Channel Impedance Value: 460 Ohm
Lead Channel Impedance Value: 800 Ohm
Lead Channel Pacing Threshold Amplitude: 0.75 V
Lead Channel Pacing Threshold Amplitude: 1 V
Lead Channel Pacing Threshold Amplitude: 1 V
Lead Channel Pacing Threshold Pulse Width: 0.5 ms
Lead Channel Pacing Threshold Pulse Width: 0.5 ms
Lead Channel Pacing Threshold Pulse Width: 0.5 ms
Lead Channel Sensing Intrinsic Amplitude: 12 mV
Lead Channel Sensing Intrinsic Amplitude: 4.5 mV
Lead Channel Setting Pacing Amplitude: 2 V
Lead Channel Setting Pacing Amplitude: 2 V
Lead Channel Setting Pacing Amplitude: 2 V
Lead Channel Setting Pacing Pulse Width: 0.5 ms
Lead Channel Setting Pacing Pulse Width: 0.5 ms
Lead Channel Setting Sensing Sensitivity: 0.5 mV
Pulse Gen Serial Number: 210000231

## 2022-12-06 ENCOUNTER — Ambulatory Visit: Payer: BC Managed Care – PPO | Attending: Cardiology

## 2022-12-06 DIAGNOSIS — I5022 Chronic systolic (congestive) heart failure: Secondary | ICD-10-CM | POA: Diagnosis not present

## 2022-12-06 DIAGNOSIS — Z95810 Presence of automatic (implantable) cardiac defibrillator: Secondary | ICD-10-CM | POA: Diagnosis not present

## 2022-12-07 ENCOUNTER — Telehealth: Payer: Self-pay

## 2022-12-07 NOTE — Telephone Encounter (Signed)
Remote ICM transmission received.  Attempted call to patient regarding ICM remote transmission and left detailed message per DPR.  Left ICM phone number and advised to return call for any fluid symptoms or questions. Next ICM remote transmission scheduled 01/10/2023.

## 2022-12-07 NOTE — Progress Notes (Signed)
EPIC Encounter for ICM Monitoring  Patient Name: Gary Williamson is a 57 y.o. male Date: 12/07/2022 Primary Care Physican: Tollie Eth, NP Primary Cardiologist: Shirlee Latch Electrophysiologist: Townsend Roger Pacing: >99%     04/05/2022 Office Weight: 342 lbs   AT/AF Burden: 0% (taking Eliquis)     Attempted call to patient and unable to reach.  Left detailed message per DPR regarding transmission. Transmission reviewed.    Corvue Thoracic impedance suggesting pattern of days with possible fluid accumulation alternating with days of possible dryness within the last month.     Prescribed: Torsemide 20 mg take 2 tablets (40 mg total) daily. Potassium 20 mEq take 1 tablet daily Spironolactone 25 mg take 1 tablet daily   Labs: 08/13/2022 Creatinine 1.34, BUN 13, Potassium 3.8, Sodium 140, GFR >60 08/05/2022 Creatinine 1.55, BUN 12, Potassium 3.4, Sodium 142, GFR 52 A complete set of results can be found in Results Review.   Recommendations:  Left voice mail with ICM number and encouraged to call if experiencing any fluid symptoms.   Follow-up plan: ICM clinic phone appointment on 12/06/2022.   91 day device clinic remote transmission 12/02/2022.     EP/Cardiology Office Visits:  Left message to call offices of Dr Shirlee Latch and Dr Lalla Brothers to make routine office visits.     Recall 12/03/2022 with Dr Shirlee Latch.   Recall 09/28/2022 with Dr Lalla Brothers.   Copy of ICM check sent to Dr. Lalla Brothers.   Will send to Dr Shirlee Latch for review if patient is reached.  3 month ICM trend: 12/06/2022.    12-14 Month ICM trend:     Karie Soda, RN 12/07/2022 1:35 PM

## 2022-12-14 NOTE — Progress Notes (Signed)
Remote ICD transmission.   

## 2023-01-10 ENCOUNTER — Ambulatory Visit: Payer: BC Managed Care – PPO | Attending: Cardiology

## 2023-01-10 DIAGNOSIS — Z95810 Presence of automatic (implantable) cardiac defibrillator: Secondary | ICD-10-CM

## 2023-01-10 DIAGNOSIS — I5022 Chronic systolic (congestive) heart failure: Secondary | ICD-10-CM

## 2023-01-14 NOTE — Progress Notes (Signed)
EPIC Encounter for ICM Monitoring  Patient Name: Gary Williamson is a 57 y.o. male Date: 01/14/2023 Primary Care Physican: Tollie Eth, NP Primary Cardiologist: Shirlee Latch Electrophysiologist: Townsend Roger Pacing: >99%     04/05/2022 Office Weight: 342 lbs   AT/AF Burden: 0% (taking Eliquis)     Transmission reviewed.    Corvue Thoracic impedance suggesting possible fluid accumulation from 8/4-8/10 and 8/21-8/26.     Prescribed: Torsemide 20 mg take 2 tablets (40 mg total) daily. Potassium 20 mEq take 1 tablet daily Spironolactone 25 mg take 1 tablet daily   Labs: 08/13/2022 Creatinine 1.34, BUN 13, Potassium 3.8, Sodium 140, GFR >60 08/05/2022 Creatinine 1.55, BUN 12, Potassium 3.4, Sodium 142, GFR 52 A complete set of results can be found in Results Review.   Recommendations:  No changes.    Follow-up plan: ICM clinic phone appointment on 02/14/2023.   91 day device clinic remote transmission 03/03/2023.     EP/Cardiology Office Visits:     Recall 12/03/2022 with Dr Shirlee Latch.   Recall 09/28/2022 with Dr Lalla Brothers.   Copy of ICM check sent to Dr. Lalla Brothers   3 month ICM trend: 01/10/2023.    12-14 Month ICM trend:     Karie Soda, RN 01/14/2023 2:13 PM

## 2023-01-23 ENCOUNTER — Other Ambulatory Visit (HOSPITAL_BASED_OUTPATIENT_CLINIC_OR_DEPARTMENT_OTHER): Payer: Self-pay | Admitting: Nurse Practitioner

## 2023-01-23 DIAGNOSIS — I428 Other cardiomyopathies: Secondary | ICD-10-CM

## 2023-02-08 ENCOUNTER — Other Ambulatory Visit: Payer: Self-pay

## 2023-02-08 ENCOUNTER — Telehealth: Payer: Self-pay | Admitting: Nurse Practitioner

## 2023-02-08 DIAGNOSIS — E559 Vitamin D deficiency, unspecified: Secondary | ICD-10-CM

## 2023-02-08 DIAGNOSIS — I4891 Unspecified atrial fibrillation: Secondary | ICD-10-CM

## 2023-02-08 DIAGNOSIS — I5022 Chronic systolic (congestive) heart failure: Secondary | ICD-10-CM

## 2023-02-08 DIAGNOSIS — E782 Mixed hyperlipidemia: Secondary | ICD-10-CM

## 2023-02-08 DIAGNOSIS — Z0000 Encounter for general adult medical examination without abnormal findings: Secondary | ICD-10-CM

## 2023-02-08 DIAGNOSIS — E1165 Type 2 diabetes mellitus with hyperglycemia: Secondary | ICD-10-CM

## 2023-02-08 DIAGNOSIS — I428 Other cardiomyopathies: Secondary | ICD-10-CM

## 2023-02-08 DIAGNOSIS — E7849 Other hyperlipidemia: Secondary | ICD-10-CM

## 2023-02-08 DIAGNOSIS — I10 Essential (primary) hypertension: Secondary | ICD-10-CM

## 2023-02-08 DIAGNOSIS — Z6841 Body Mass Index (BMI) 40.0 and over, adult: Secondary | ICD-10-CM

## 2023-02-08 DIAGNOSIS — E66813 Obesity, class 3: Secondary | ICD-10-CM

## 2023-02-08 MED ORDER — MOUNJARO 15 MG/0.5ML ~~LOC~~ SOAJ
15.0000 mg | SUBCUTANEOUS | 1 refills | Status: DC
Start: 2023-02-08 — End: 2023-05-16

## 2023-02-08 NOTE — Telephone Encounter (Signed)
Pt wants mounjaro sent to [facility] order does not have

## 2023-02-14 ENCOUNTER — Ambulatory Visit: Payer: BC Managed Care – PPO | Attending: Cardiology

## 2023-02-14 DIAGNOSIS — Z95810 Presence of automatic (implantable) cardiac defibrillator: Secondary | ICD-10-CM | POA: Diagnosis not present

## 2023-02-14 DIAGNOSIS — I5022 Chronic systolic (congestive) heart failure: Secondary | ICD-10-CM | POA: Diagnosis not present

## 2023-02-15 ENCOUNTER — Telehealth: Payer: Self-pay

## 2023-02-15 ENCOUNTER — Telehealth: Payer: Self-pay | Admitting: Nurse Practitioner

## 2023-02-15 NOTE — Telephone Encounter (Signed)
Spoke with pharmacy, rx going through for one box at a time. Pt notified.

## 2023-02-15 NOTE — Telephone Encounter (Signed)
Key: [id number] Outcome:  Your PA has been resolved, no additional PA is required. For further inquiries please contact the number on the back of the member prescription card. Drug: Mounjaro 15MG /0.5ML auto-injectors Form: Ambulance person PA Form [phone] NCPDP)

## 2023-02-15 NOTE — Telephone Encounter (Signed)
Error

## 2023-02-15 NOTE — Telephone Encounter (Signed)
Pt called regarding his Mounjaro requiring a P.A. he takes this for diabetes

## 2023-02-18 NOTE — Progress Notes (Signed)
EPIC Encounter for ICM Monitoring  Patient Name: Gary Williamson is a 57 y.o. male Date: 02/18/2023 Primary Care Physican: Tollie Eth, NP Primary Cardiologist: Shirlee Latch Electrophysiologist: Townsend Roger Pacing: >99%     04/05/2022 Office Weight: 342 lbs   AT/AF Burden: 0% (taking Eliquis)     Transmission reviewed.    Corvue Thoracic impedance suggesting pattern of possible fluid accumulation for 3-4 days alternating with possible dryness 3-4 days.     Prescribed: Torsemide 20 mg take 2 tablets (40 mg total) daily. Potassium 20 mEq take 1 tablet daily Spironolactone 25 mg take 1 tablet daily   Labs: 08/13/2022 Creatinine 1.34, BUN 13, Potassium 3.8, Sodium 140, GFR >60 08/05/2022 Creatinine 1.55, BUN 12, Potassium 3.4, Sodium 142, GFR 52 A complete set of results can be found in Results Review.   Recommendations:  No changes.    Follow-up plan: ICM clinic phone appointment on 03/21/2023.   91 day device clinic remote transmission 03/03/2023.     EP/Cardiology Office Visits:     Recall 12/03/2022 with Dr Shirlee Latch.   Recall 09/28/2022 with Dr Lalla Brothers.   Copy of ICM check sent to Dr. Lalla Brothers.  3 month ICM trend: 02/14/2023.    12-14 Month ICM trend:     Karie Soda, RN 02/18/2023 3:38 PM

## 2023-02-28 ENCOUNTER — Encounter: Payer: Self-pay | Admitting: Nurse Practitioner

## 2023-02-28 ENCOUNTER — Ambulatory Visit: Payer: BC Managed Care – PPO | Admitting: Nurse Practitioner

## 2023-02-28 VITALS — BP 124/82 | HR 88 | Wt 335.4 lb

## 2023-02-28 DIAGNOSIS — L03111 Cellulitis of right axilla: Secondary | ICD-10-CM

## 2023-02-28 DIAGNOSIS — L723 Sebaceous cyst: Secondary | ICD-10-CM

## 2023-02-28 HISTORY — DX: Cellulitis of right axilla: L03.111

## 2023-02-28 HISTORY — DX: Sebaceous cyst: L72.3

## 2023-02-28 MED ORDER — SULFAMETHOXAZOLE-TRIMETHOPRIM 800-160 MG PO TABS
1.0000 | ORAL_TABLET | Freq: Two times a day (BID) | ORAL | 0 refills | Status: DC
Start: 2023-02-28 — End: 2023-06-08

## 2023-02-28 MED ORDER — TRAMADOL HCL 50 MG PO TABS
50.0000 mg | ORAL_TABLET | Freq: Three times a day (TID) | ORAL | 0 refills | Status: AC | PRN
Start: 2023-02-28 — End: 2023-03-05

## 2023-02-28 NOTE — Progress Notes (Signed)
  Tollie Eth, DNP, AGNP-c [facility] [address] [place], [place] [address] [phone]   ACUTE VISIT- ESTABLISHED PATIENT  Blood pressure 124/82, pulse 88, weight (!) 335 lb 6.4 oz (152.1 kg).  Subjective:  HPI Jermonte Vandenbroek is a 57 y.o. male presents to day for evaluation of acute concern(s).   History of Present Illness The patient, a 57 year old individual, presents with a longstanding lump in the right axillary region that has recently become symptomatic. The lump, which has been present for an extended period, has recently started to cause discomfort. The patient reports that the lump has increased in size and has become red and tender to touch. The patient also notes that the discomfort is more pronounced at night, particularly when lying on the affected side. There are also changes in skin color around the lump with redness and shiny appearance on the front.  The patient has no known allergies.  ROS negative except for what is listed in HPI. History, Medications, Surgery, SDOH, and Family History reviewed and updated as appropriate.  Objective:  Physical Exam Vitals and nursing note reviewed. Exam conducted with a chaperone present.  Musculoskeletal:        General: Normal range of motion.  Skin:    General: Skin is warm and dry.     Capillary Refill: Capillary refill takes less than 2 seconds.       Neurological:     General: No focal deficit present.     Mental Status: He is oriented to person, place, and time.  Psychiatric:        Mood and Affect: Mood normal.        Behavior: Behavior normal.          Assessment & Plan:   Problem List Items Addressed This Visit     Sebaceous cyst of axilla    4cm soft, fluctuant mass noted on right axilla. Most anterior portion erythematous and firm with cellulitis appearance.  Verbal consent obtained and incision and drainage performed. In normal fashion area cleansed with betadine solution and  allowed to dry. 1% lidocaine buffered with sodium bicarbonate utilized to create a block. Utilizing #11 blade a 1cm incision made into the center of the fluctuant area and copious amounts of yellow purulent matter and blood drained. Unable to remove the wall of the cyst due to degradation of the material.  Area cleansed and bandaged, allowing the incision to remain open to account for any additional drainage. Patient provided with instructions for after care and follow-up.  Patient tolerated procedure well.  Antibiotics and pain medication sent to pharmacy.       Relevant Medications   sulfamethoxazole-trimethoprim (BACTRIM DS) 800-160 MG tablet   traMADol (ULTRAM) 50 MG tablet   Cellulitis of right axilla - Primary   Relevant Medications   sulfamethoxazole-trimethoprim (BACTRIM DS) 800-160 MG tablet   traMADol (ULTRAM) 50 MG tablet      Tollie Eth, DNP, AGNP-c

## 2023-02-28 NOTE — Patient Instructions (Signed)
Change the bandage at least twice a day for as long as this is draining.   You may take the bandage off to shower and dont need to cover the area in the shower.   The incision will heal on it's own.  Avoid using deodorant for the next few days.  Wash the area with dial or other antibacterial soap in the shower and rinse well then pat dry.  If you notice worsening drainage, more redness, warmth, or swelling, let me know immediately.   Epidermoid Cyst Removal, Care After This sheet gives you information about how to care for yourself after your procedure. Your health care provider may also give you more specific instructions. If you have problems or questions, contact your health care provider. What can I expect after the procedure? After the procedure, it is common to have: Soreness in the area where your cyst was removed. Tightness or itchiness from the stitches (sutures) in your skin. Follow these instructions at home: Medicines Take over-the-counter and prescription medicines only as told by your health care provider. If you were prescribed an antibiotic medicine or ointment, take or apply it as told by your health care provider. Do not stop using the antibiotic even if you start to feel better. Incision care  Follow instructions from your health care provider about how to take care of your incision. Make sure you: Wash your hands with soap and water for at least 20 seconds before you change your bandage (dressing). If soap and water are not available, use hand sanitizer. Change your dressing as told by your health care provider. Leave sutures, skin glue, or adhesive strips in place. These skin closures may need to stay in place for 1-2 weeks or longer. If adhesive strip edges start to loosen and curl up, you may trim the loose edges. Do not remove adhesive strips completely unless your health care provider tells you to do that. Keep the dressing dry until your health care provider says  that it can be removed. After your dressing is off, check your incision area every day for signs of infection. Check for: Redness, swelling, or pain. Fluid or blood. Warmth. Pus or a bad smell. General instructions Do not take baths, swim, or use a hot tub until your health care provider approves. Ask your health care provider if you may take showers. You may only be allowed to take sponge baths. Your health care provider may ask you to avoid contact sports or activities that take a lot of effort. Do not do anything that stretches or puts pressure on your incision. You can return to your normal diet. Keep all follow-up visits. This is important. Contact a health care provider if: You have a fever. You have redness, swelling, or pain in the incision area. You have fluid or blood coming from your incision. You have pus or a bad smell coming from your incision. Your incision feels warm to the touch. Your cyst grows back. Get help right away if: If the incision site suddenly increases in size and you have pain at the incision site. You may be checked for a collection of blood under the skin from the procedure (hematoma). Summary After the procedure, it is common to have soreness in the area where your cyst was removed. Take or apply over-the-counter and prescription medicines only as told by your health care provider. Follow instructions from your health care provider about how to take care of your incision. This information is not intended to  replace advice given to you by your health care provider. Make sure you discuss any questions you have with your health care provider. Document Revised: 07/24/2019 Document Reviewed: 07/25/2019 Elsevier Patient Education  2024 [employer].

## 2023-02-28 NOTE — Assessment & Plan Note (Signed)
4cm soft, fluctuant mass noted on right axilla. Most anterior portion erythematous and firm with cellulitis appearance.  Verbal consent obtained and incision and drainage performed. In normal fashion area cleansed with betadine solution and allowed to dry. 1% lidocaine buffered with sodium bicarbonate utilized to create a block. Utilizing #11 blade a 1cm incision made into the center of the fluctuant area and copious amounts of yellow purulent matter and blood drained. Unable to remove the wall of the cyst due to degradation of the material.  Area cleansed and bandaged, allowing the incision to remain open to account for any additional drainage. Patient provided with instructions for after care and follow-up.  Patient tolerated procedure well.  Antibiotics and pain medication sent to pharmacy.

## 2023-03-03 ENCOUNTER — Ambulatory Visit (INDEPENDENT_AMBULATORY_CARE_PROVIDER_SITE_OTHER): Payer: BC Managed Care – PPO

## 2023-03-03 DIAGNOSIS — I428 Other cardiomyopathies: Secondary | ICD-10-CM

## 2023-03-03 LAB — CUP PACEART REMOTE DEVICE CHECK
Battery Remaining Longevity: 72 mo
Battery Remaining Percentage: 78 %
Battery Voltage: 2.98 V
Brady Statistic AP VP Percent: 3.2 %
Brady Statistic AP VS Percent: 1 %
Brady Statistic AS VP Percent: 96 %
Brady Statistic AS VS Percent: 1 %
Brady Statistic RA Percent Paced: 3.1 %
Date Time Interrogation Session: 20241031020138
HighPow Impedance: 75 Ohm
Implantable Lead Connection Status: 753985
Implantable Lead Connection Status: 753985
Implantable Lead Connection Status: 753985
Implantable Lead Implant Date: 20150421
Implantable Lead Implant Date: 20150421
Implantable Lead Implant Date: 20150421
Implantable Lead Location: 753857
Implantable Lead Location: 753859
Implantable Lead Location: 753860
Implantable Pulse Generator Implant Date: 20230421
Lead Channel Impedance Value: 360 Ohm
Lead Channel Impedance Value: 460 Ohm
Lead Channel Impedance Value: 790 Ohm
Lead Channel Pacing Threshold Amplitude: 0.75 V
Lead Channel Pacing Threshold Amplitude: 1 V
Lead Channel Pacing Threshold Amplitude: 1 V
Lead Channel Pacing Threshold Pulse Width: 0.5 ms
Lead Channel Pacing Threshold Pulse Width: 0.5 ms
Lead Channel Pacing Threshold Pulse Width: 0.5 ms
Lead Channel Sensing Intrinsic Amplitude: 12 mV
Lead Channel Sensing Intrinsic Amplitude: 3.7 mV
Lead Channel Setting Pacing Amplitude: 2 V
Lead Channel Setting Pacing Amplitude: 2 V
Lead Channel Setting Pacing Amplitude: 2 V
Lead Channel Setting Pacing Pulse Width: 0.5 ms
Lead Channel Setting Pacing Pulse Width: 0.5 ms
Lead Channel Setting Sensing Sensitivity: 0.5 mV
Pulse Gen Serial Number: 210000231

## 2023-03-16 NOTE — Progress Notes (Signed)
Remote ICD transmission.   

## 2023-03-21 ENCOUNTER — Ambulatory Visit: Payer: BC Managed Care – PPO | Attending: Cardiology

## 2023-03-21 DIAGNOSIS — I5022 Chronic systolic (congestive) heart failure: Secondary | ICD-10-CM

## 2023-03-21 DIAGNOSIS — Z95810 Presence of automatic (implantable) cardiac defibrillator: Secondary | ICD-10-CM

## 2023-03-22 NOTE — Progress Notes (Signed)
EPIC Encounter for ICM Monitoring  Patient Name: Gary Williamson is a 57 y.o. male Date: 03/22/2023 Primary Care Physican: Tollie Eth, NP Primary Cardiologist: Shirlee Latch Electrophysiologist: Townsend Roger Pacing: >99%     04/05/2022 Office Weight: 342 lbs 02/28/2023 Office Weight: 335 lbs   AT/AF Burden: 0% (taking Eliquis)     Transmission reviewed.    Corvue Thoracic impedance suggesting repetitive pattern of possible fluid accumulation for 3-4 days alternating with possible dryness 3-4 days.     Prescribed: Torsemide 20 mg take 2 tablets (40 mg total) daily. Potassium 20 mEq take 1 tablet daily Spironolactone 25 mg take 1 tablet daily   Labs: 08/13/2022 Creatinine 1.34, BUN 13, Potassium 3.8, Sodium 140, GFR >60 08/05/2022 Creatinine 1.55, BUN 12, Potassium 3.4, Sodium 142, GFR 52 A complete set of results can be found in Results Review.   Recommendations:  No changes.    Follow-up plan: ICM clinic phone appointment on 05/02/2023.   91 day device clinic remote transmission 06/02/2023.     EP/Cardiology Office Visits:     Recall 12/03/2022 with Dr Shirlee Latch.   Recall 09/28/2022 with Dr Lalla Brothers.   Copy of ICM check sent to Dr. Lalla Brothers.  3 month ICM trend: 03/21/2023.    12-14 Month ICM trend:     Karie Soda, RN 03/22/2023 8:47 AM

## 2023-03-23 ENCOUNTER — Encounter: Payer: Self-pay | Admitting: Nurse Practitioner

## 2023-03-23 ENCOUNTER — Ambulatory Visit: Payer: BC Managed Care – PPO | Admitting: Nurse Practitioner

## 2023-03-23 VITALS — BP 124/82 | HR 90 | Wt 335.4 lb

## 2023-03-23 DIAGNOSIS — L0291 Cutaneous abscess, unspecified: Secondary | ICD-10-CM

## 2023-03-23 NOTE — Progress Notes (Signed)
Tollie Eth, DNP, AGNP-c [facility] [address] [place], [place] [address] [phone]   ACUTE VISIT- ESTABLISHED PATIENT  Blood pressure 124/82, pulse 90, weight (!) 335 lb 6.4 oz (152.1 kg).  Subjective:  HPI Gary Williamson is a 57 y.o. male presents to day for evaluation of acute concern(s).   History of Present Illness The patient, with a history of a cyst under the right arm, presents with recurrent irritation and bleeding from the site. The cyst was initially drained on the 28th, but the patient noticed a foul-smelling drainage on their shirt a few days later. They sought medical attention at an urgent care center, where they were given an antibiotic and a wound culture was obtained. Despite the antibiotic, the wound continued to drain, prompting a switch to amoxicillin.  The patient reported that the drainage was significant enough to necessitate a bandage change at work. However, the amount of drainage has been decreasing, with very little blood noted on the bandage in the morning. The patient also reported a decrease in the odor from the wound.  The patient expressed concern about the wound healing properly, as it was described as a "golf ball-sized" area that seemed to be filling back up with blood rather than pus. They denied any systemic symptoms such as fever or chills. The patient also mentioned a change in deodorant prior to the onset of the cyst, speculating whether this could have contributed to the issue.  The patient is currently on Eliquis, which they believe may be contributing to the continued bleeding from the wound. They are awaiting a consultation with a general surgeon for further management of the cyst.  ROS negative except for what is listed in HPI. History, Medications, Surgery, SDOH, and Family History reviewed and updated as appropriate.  Objective:  Physical Exam Vitals and nursing note reviewed.  Constitutional:      General: He  is not in acute distress.    Appearance: Normal appearance. He is obese. He is not ill-appearing.  Musculoskeletal:        General: Normal range of motion.  Skin:    General: Skin is warm and dry.       Neurological:     Mental Status: He is alert.          Assessment & Plan:   Problem List Items Addressed This Visit     Pyogenic abscess - Primary   Pyogenic Granuloma with Adjacent Abscess Recurrent pyogenic granuloma with adjacent abscess under the right arm. Initially drained on February 28, 2023, with subsequent drainage and bleeding. Culture revealed Finigoldia magna. Currently on amoxicillin. Lesion draining blood rather than pus, with concern for incomplete healing and recurrence. On Eliquis, complicating bleeding control. No fever or chills. Lesion improving with reduced drainage and no odor as of this morning. Discussed potential need for surgical intervention if healing is inadequate. General surgery preferred due to lesion's location. - Refer to general surgery for evaluation and potential removal of the lesion - Continue amoxicillin for a total of 10 days - Advise against hot compresses to avoid increasing blood flow - Apply antibacterial ointment and bandage the lesion - Monitor for signs of infection such as fever, chills, or increased drainage - Follow up if drainage persists or if there is any odor after completing the antibiotic course - Notify the provider at [facility] about the current status and treatment plan  Follow-up - Wait for a call from general surgery for an appointment - Contact the clinic  if there is still drainage or odor after completing the antibiotic course - Notify the clinic immediately if experiencing fever, chills, or feeling unwell.  Tollie Eth, DNP, AGNP-c

## 2023-03-23 NOTE — Patient Instructions (Signed)
If you begin to have a fever, chills, body aches, or in general feel bad, please let us know immediately.   If there is still drainage or odor next Monday, let me know and we will extend the antibiotic.   If this goes down completely and general surgery contacts you, you can tell them you do not need the appointment, but if there is any swelling or "puffiness" in the area still, I want you to go ahead and see them to make sure they don't need to do any further surgery.

## 2023-03-28 DIAGNOSIS — L0291 Cutaneous abscess, unspecified: Secondary | ICD-10-CM

## 2023-03-28 HISTORY — DX: Cutaneous abscess, unspecified: L02.91

## 2023-03-29 ENCOUNTER — Encounter: Payer: Self-pay | Admitting: Nurse Practitioner

## 2023-03-29 ENCOUNTER — Other Ambulatory Visit: Payer: Self-pay

## 2023-03-29 DIAGNOSIS — L0291 Cutaneous abscess, unspecified: Secondary | ICD-10-CM

## 2023-03-29 DIAGNOSIS — L723 Sebaceous cyst: Secondary | ICD-10-CM

## 2023-04-25 ENCOUNTER — Encounter: Payer: Self-pay | Admitting: Nurse Practitioner

## 2023-04-25 NOTE — Telephone Encounter (Signed)
 Care team updated and letter sent for eye exam notes.

## 2023-05-02 ENCOUNTER — Ambulatory Visit: Payer: BC Managed Care – PPO | Attending: Cardiology

## 2023-05-02 DIAGNOSIS — Z95810 Presence of automatic (implantable) cardiac defibrillator: Secondary | ICD-10-CM | POA: Diagnosis not present

## 2023-05-02 DIAGNOSIS — I5022 Chronic systolic (congestive) heart failure: Secondary | ICD-10-CM | POA: Diagnosis not present

## 2023-05-09 ENCOUNTER — Ambulatory Visit: Payer: BC Managed Care – PPO | Attending: Cardiology

## 2023-05-09 DIAGNOSIS — I5022 Chronic systolic (congestive) heart failure: Secondary | ICD-10-CM

## 2023-05-09 DIAGNOSIS — Z95810 Presence of automatic (implantable) cardiac defibrillator: Secondary | ICD-10-CM

## 2023-05-09 NOTE — Progress Notes (Signed)
 EPIC Encounter for ICM Monitoring  Patient Name: Gary Williamson is a 58 y.o. male Date: 05/09/2023 Primary Care Physican: Oris Camie BRAVO, NP Primary Cardiologist: Rolan Electrophysiologist: Cindie Pore Pacing: >99%     04/05/2022 Office Weight: 342 lbs 02/28/2023 Office Weight: 335 lbs 05/09/2023 Weight: 335 lbs   AT/AF Burden: 0% (taking Eliquis )     Spoke with patient and heart failure questions reviewed.  Transmission results reviewed.  Pt asymptomatic for fluid accumulation.  Reports feeling well at this time and voices no complaints.    Diet:  05/09/2023 he reports he may be drinking more fluid on some days and greater than recommended amount of 64 oz daily.   Corvue Thoracic impedance suggesting possible fluid accumulation returned to normal 12/31 but possible fluid accumulation restarted 1/3.  Suggestive ongoing repetitive pattern of possible fluid accumulation for 3-4 days alternating with possible dryness 3-4 days within the last 6 months.     Prescribed: Torsemide  20 mg take 2 tablets (40 mg total) daily.  Confirmed 05/09/2023 he takes Torsemide  40 mg daily. Potassium 20 mEq take 1 tablet daily Spironolactone  25 mg take 1 tablet daily   Labs: 08/13/2022 Creatinine 1.34, BUN 13, Potassium 3.8, Sodium 140, GFR >60 08/05/2022 Creatinine 1.55, BUN 12, Potassium 3.4, Sodium 142, GFR 52 A complete set of results can be found in Results Review.   Recommendations:  Recommendation to limit salt intake to 2000 mg daily and fluid intake to 64 oz daily.  Encouraged to call if experiencing any fluid symptoms.    Follow-up plan: ICM clinic phone appointment on 05/16/2023 to recheck fluid levels.   91 day device clinic remote transmission 06/02/2023.     EP/Cardiology Office Visits:   Advised to call Dr Orvilla and Dr Ramona office for overdue appointments.  Recall 12/03/2022 with Dr Rolan.   Recall 09/28/2022 with Dr Cindie.   Copy of ICM check sent to Dr. Cindie.      12-14 Month ICM  trend:     Gary GORMAN Garner, RN 05/09/2023 1:06 PM

## 2023-05-12 ENCOUNTER — Telehealth: Payer: Self-pay

## 2023-05-12 ENCOUNTER — Other Ambulatory Visit (HOSPITAL_COMMUNITY): Payer: Self-pay

## 2023-05-12 NOTE — Telephone Encounter (Signed)
 Pharmacy Patient Advocate Encounter   Received notification from CoverMyMeds that prior authorization for Mounjaro  15mg   is required/requested.   Insurance verification completed.   The patient is insured through [facility] .   Per test claim: PA required; PA submitted to above mentioned insurance via Prompt PA Key/confirmation #/EOC [id number] Status is pending

## 2023-05-16 ENCOUNTER — Ambulatory Visit: Payer: BC Managed Care – PPO | Attending: Cardiology

## 2023-05-16 ENCOUNTER — Other Ambulatory Visit: Payer: Self-pay

## 2023-05-16 ENCOUNTER — Telehealth: Payer: Self-pay

## 2023-05-16 ENCOUNTER — Other Ambulatory Visit (HOSPITAL_COMMUNITY): Payer: Self-pay

## 2023-05-16 DIAGNOSIS — Z6841 Body Mass Index (BMI) 40.0 and over, adult: Secondary | ICD-10-CM

## 2023-05-16 DIAGNOSIS — E559 Vitamin D deficiency, unspecified: Secondary | ICD-10-CM

## 2023-05-16 DIAGNOSIS — E7849 Other hyperlipidemia: Secondary | ICD-10-CM

## 2023-05-16 DIAGNOSIS — Z95810 Presence of automatic (implantable) cardiac defibrillator: Secondary | ICD-10-CM

## 2023-05-16 DIAGNOSIS — I5022 Chronic systolic (congestive) heart failure: Secondary | ICD-10-CM

## 2023-05-16 DIAGNOSIS — Z0000 Encounter for general adult medical examination without abnormal findings: Secondary | ICD-10-CM

## 2023-05-16 DIAGNOSIS — I4891 Unspecified atrial fibrillation: Secondary | ICD-10-CM

## 2023-05-16 DIAGNOSIS — I10 Essential (primary) hypertension: Secondary | ICD-10-CM

## 2023-05-16 DIAGNOSIS — E1165 Type 2 diabetes mellitus with hyperglycemia: Secondary | ICD-10-CM

## 2023-05-16 DIAGNOSIS — E782 Mixed hyperlipidemia: Secondary | ICD-10-CM

## 2023-05-16 DIAGNOSIS — I428 Other cardiomyopathies: Secondary | ICD-10-CM

## 2023-05-16 MED ORDER — MOUNJARO 15 MG/0.5ML ~~LOC~~ SOAJ
15.0000 mg | SUBCUTANEOUS | 1 refills | Status: DC
Start: 2023-05-16 — End: 2023-08-30

## 2023-05-16 NOTE — Telephone Encounter (Signed)
 Faxed request to clarify mounjaro 15 mg rx. No further instructions or specifications on what needs to be clarified.

## 2023-05-16 NOTE — Telephone Encounter (Signed)
 Pharmacy Patient Advocate Encounter  Received notification from [employer] that Prior Authorization for Mounjaro   has been APPROVED from 01.10.25 to 01.09.26. Ran test claim, Copay is $RTS AND IS PAYABLE AGAIN ON/AFTER  06/06/2023. This test claim was processed through [facility]- copay amounts may vary at other pharmacies due to pharmacy/plan contracts, or as the patient moves through the different stages of their insurance plan.

## 2023-05-17 NOTE — Progress Notes (Signed)
 EPIC Encounter for ICM Monitoring  Patient Name: Gary Williamson is a 58 y.o. male Date: 05/17/2023 Primary Care Physican: Oris Camie BRAVO, NP Primary Cardiologist: Rolan Electrophysiologist: Cindie Pore Pacing: >99%     04/05/2022 Office Weight: 342 lbs 02/28/2023 Office Weight: 335 lbs 05/09/2023 Weight: 335 lbs   AT/AF Burden: 0% (taking Eliquis )     Attempted call to patient and unable to reach.  Left detailed message per DPR regarding transmission.  Transmission results reviewed.     Diet:  05/09/2023 he reports he may be drinking more fluid on some days and greater than recommended amount of 64 oz daily.   Corvue Thoracic impedance suggesting fluid levels returned close to normal 1/3.  Suggestive ongoing repetitive pattern of possible fluid accumulation for 3-4 days alternating with possible dryness 3-4 days for the last 6 months.     Prescribed: Torsemide  20 mg take 2 tablets (40 mg total) daily.  Confirmed 05/09/2023 he takes Torsemide  40 mg daily. Potassium 20 mEq take 1 tablet daily Spironolactone  25 mg take 1 tablet daily   Labs: 08/13/2022 Creatinine 1.34, BUN 13, Potassium 3.8, Sodium 140, GFR >60 08/05/2022 Creatinine 1.55, BUN 12, Potassium 3.4, Sodium 142, GFR 52 A complete set of results can be found in Results Review.   Recommendations:  Left voice mail with ICM number and encouraged to call if experiencing any fluid symptoms.    Follow-up plan: ICM clinic phone appointment on 06/06/2023.   91 day device clinic remote transmission 06/02/2023.     EP/Cardiology Office Visits:   Advised 1/6 to call Dr Orvilla and Dr Ramona office for overdue appointments.  Recall 12/03/2022 with Dr Rolan.   Recall 09/28/2022 with Dr Cindie.   Copy of ICM check sent to Dr. Cindie.    3 month ICM trend: 05/16/2023.    12-14 Month ICM trend:     [name], RN 05/17/2023 8:12 AM

## 2023-05-18 ENCOUNTER — Telehealth: Payer: Self-pay

## 2023-05-18 NOTE — Telephone Encounter (Signed)
 Remote ICM transmission received.  Attempted call to patient regarding ICM remote transmission and left detailed message per DPR.  Left ICM phone number and advised to return call for any fluid symptoms or questions. Next ICM remote transmission scheduled 06/06/2023.

## 2023-05-28 ENCOUNTER — Encounter: Payer: Self-pay | Admitting: Nurse Practitioner

## 2023-06-02 ENCOUNTER — Ambulatory Visit (INDEPENDENT_AMBULATORY_CARE_PROVIDER_SITE_OTHER): Payer: BC Managed Care – PPO

## 2023-06-02 DIAGNOSIS — I428 Other cardiomyopathies: Secondary | ICD-10-CM

## 2023-06-02 LAB — CUP PACEART REMOTE DEVICE CHECK
Battery Remaining Longevity: 70 mo
Battery Remaining Percentage: 75 %
Battery Voltage: 2.96 V
Brady Statistic AP VP Percent: 2.9 %
Brady Statistic AP VS Percent: 1 %
Brady Statistic AS VP Percent: 97 %
Brady Statistic AS VS Percent: 1 %
Brady Statistic RA Percent Paced: 2.8 %
Date Time Interrogation Session: 20250130020836
HighPow Impedance: 73 Ohm
Implantable Lead Connection Status: 753985
Implantable Lead Connection Status: 753985
Implantable Lead Connection Status: 753985
Implantable Lead Implant Date: 20150421
Implantable Lead Implant Date: 20150421
Implantable Lead Implant Date: 20150421
Implantable Lead Location: 753857
Implantable Lead Location: 753859
Implantable Lead Location: 753860
Implantable Pulse Generator Implant Date: 20230421
Lead Channel Impedance Value: 400 Ohm
Lead Channel Impedance Value: 450 Ohm
Lead Channel Impedance Value: 740 Ohm
Lead Channel Pacing Threshold Amplitude: 0.75 V
Lead Channel Pacing Threshold Amplitude: 1 V
Lead Channel Pacing Threshold Amplitude: 1 V
Lead Channel Pacing Threshold Pulse Width: 0.5 ms
Lead Channel Pacing Threshold Pulse Width: 0.5 ms
Lead Channel Pacing Threshold Pulse Width: 0.5 ms
Lead Channel Sensing Intrinsic Amplitude: 12 mV
Lead Channel Sensing Intrinsic Amplitude: 3.8 mV
Lead Channel Setting Pacing Amplitude: 2 V
Lead Channel Setting Pacing Amplitude: 2 V
Lead Channel Setting Pacing Amplitude: 2 V
Lead Channel Setting Pacing Pulse Width: 0.5 ms
Lead Channel Setting Pacing Pulse Width: 0.5 ms
Lead Channel Setting Sensing Sensitivity: 0.5 mV
Pulse Gen Serial Number: 210000231

## 2023-06-05 ENCOUNTER — Encounter: Payer: Self-pay | Admitting: Cardiology

## 2023-06-06 ENCOUNTER — Ambulatory Visit: Payer: BC Managed Care – PPO | Attending: Cardiology

## 2023-06-06 ENCOUNTER — Other Ambulatory Visit (HOSPITAL_COMMUNITY): Payer: Self-pay | Admitting: Cardiology

## 2023-06-06 DIAGNOSIS — Z95810 Presence of automatic (implantable) cardiac defibrillator: Secondary | ICD-10-CM

## 2023-06-06 DIAGNOSIS — I5022 Chronic systolic (congestive) heart failure: Secondary | ICD-10-CM

## 2023-06-06 DIAGNOSIS — I428 Other cardiomyopathies: Secondary | ICD-10-CM

## 2023-06-06 DIAGNOSIS — I10 Essential (primary) hypertension: Secondary | ICD-10-CM

## 2023-06-06 MED ORDER — TORSEMIDE 20 MG PO TABS: 40.0000 mg | ORAL_TABLET | Freq: Every day | ORAL | 0 refills | Status: DC

## 2023-06-06 MED ORDER — METOPROLOL SUCCINATE ER 100 MG PO TB24: 100.0000 mg | ORAL_TABLET | Freq: Two times a day (BID) | ORAL | 0 refills | Status: DC

## 2023-06-07 NOTE — Progress Notes (Signed)
  Electrophysiology Office Follow up Visit Note:    Date:  06/08/2023   ID:  Vanden Fawaz, DOB [birth year], MRN [id number]  PCP:  Oris Camie BRAVO, NP  [facility] Cardiologist:  None  [facility] Electrophysiologist:  Lynwood Rakers, MD (Inactive)    Interval History:     Chaske Paskett is a 58 y.o. male who presents for a follow up visit.   The patient last saw Charlies in clinic April 01, 2022.  He has a history of hypertension, hyperlipidemia, obesity, left bundle branch block, chronic systolic heart failure.  He has a CRT-D that was implanted in April 2015.  He has had prior appropriate shocks for ventricular tachycardia and inappropriate shocks for rapidly conducted atrial fibrillation.  He takes amiodarone .  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  He tries to walk daily with his wife.  He thinks he can improve the frequency of his exercise.      Past medical, surgical, social and family history were reviewed.  ROS:   Please see the history of present illness.    All other systems reviewed and are negative.  EKGs/Labs/Other Studies Reviewed:    The following studies were reviewed today:  April 05, 2022 echo EF 40%  August 05, 2022 EKG shows a sensed, V paced rhythm.  BiV paced.  June 08, 2023 in clinic device interrogation personally reviewed BiV pacing greater than 99% Lead parameter stable Battery longevity 5.7 years No changes today  EKG today to confirm biventricular pacing EKG Interpretation Date/Time:  Wednesday June 08 2023 09:31:07 EST Ventricular Rate:  93 PR Interval:  158 QRS Duration:  164 QT Interval:  448 QTC Calculation: 557 R Axis:   257  Text Interpretation: Atrial-sensed ventricular-paced rhythm Biventricular pacemaker detected Confirmed by Cindie Smalls [id number]) on 06/08/2023 9:35:12 AM    Physical Exam:    VS:  BP (!) 148/86   Pulse 93   Ht 6' 1 (1.854 m)   Wt (!) 345 lb (156.5 kg)   SpO2 95%   BMI 45.52 kg/m     Wt Readings  from Last 3 Encounters:  06/08/23 (!) 345 lb (156.5 kg)  03/23/23 (!) 335 lb 6.4 oz (152.1 kg)  02/28/23 (!) 335 lb 6.4 oz (152.1 kg)     GEN: no distress.  Obese CARD: RRR, No MRG.  CIED pocket well-healed RESP: No IWOB. CTAB.      ASSESSMENT:    1. Chronic systolic congestive heart failure (HCC)   2. Biventricular implantable cardioverter-defibrillator in situ   3. Paroxysmal atrial fibrillation (HCC)   4. Encounter for long-term (current) use of high-risk medication    PLAN:    In order of problems listed above:  #Chronic systolic heart failure #CRT-D in situ NYHA class II today.  Warm and dry on exam.  Continue Farxiga , metoprolol , Entresto , spironolactone  and torsemide .  CRT-D functioning appropriately.  Continue remote monitoring.  #Atrial fibrillation #High risk drug monitoring-amiodarone  Continue amiodarone  and Eliquis  Repeat CMP and TSH and free T4 today.  Follow-up 6 months with EP APP  Signed, Smalls Cindie, MD, [facility], [facility] 06/08/2023 9:35 AM    Electrophysiology [facility]

## 2023-06-08 ENCOUNTER — Ambulatory Visit: Payer: BC Managed Care – PPO | Attending: Cardiology | Admitting: Cardiology

## 2023-06-08 ENCOUNTER — Encounter: Payer: Self-pay | Admitting: Cardiology

## 2023-06-08 ENCOUNTER — Other Ambulatory Visit: Payer: Self-pay

## 2023-06-08 VITALS — BP 148/86 | HR 93 | Ht 73.0 in | Wt 345.0 lb

## 2023-06-08 DIAGNOSIS — I5022 Chronic systolic (congestive) heart failure: Secondary | ICD-10-CM | POA: Diagnosis not present

## 2023-06-08 DIAGNOSIS — Z95810 Presence of automatic (implantable) cardiac defibrillator: Secondary | ICD-10-CM

## 2023-06-08 DIAGNOSIS — Z79899 Other long term (current) drug therapy: Secondary | ICD-10-CM | POA: Diagnosis not present

## 2023-06-08 DIAGNOSIS — I480 Paroxysmal atrial fibrillation: Secondary | ICD-10-CM

## 2023-06-08 NOTE — Addendum Note (Signed)
 Addended by: CHAUVIGNE, Elba Schaber on: 06/08/2023 09:50 AM   Modules accepted: Orders

## 2023-06-08 NOTE — Patient Instructions (Signed)
 Medication Instructions:  Your physician recommends that you continue on your current medications as directed. Please refer to the Current Medication list given to you today.  *If you need a refill on your cardiac medications before your next appointment, please call your pharmacy*  Lab Work: TODAY: CMET, TSH, T4  Follow-Up: At [facility], you and your health needs are our priority.  As part of our continuing mission to provide you with exceptional heart care, we have created designated Provider Care Teams.  These Care Teams include your primary Cardiologist (physician) and Advanced Practice Providers (APPs -  Physician Assistants and Nurse Practitioners) who all work together to provide you with the care you need, when you need it.  Your next appointment:   6 months  Provider:   You will see one of the following Advanced Practice Providers on your designated Care Team:   Francis Dowse, Charlott Holler [address] [place], [place] Sherie Don, NP Canary Brim, NP

## 2023-06-09 LAB — COMPREHENSIVE METABOLIC PANEL
ALT: 17 [IU]/L (ref 0–44)
AST: 22 [IU]/L (ref 0–40)
Albumin: 4.1 g/dL (ref 3.8–4.9)
Alkaline Phosphatase: 74 [IU]/L (ref 44–121)
BUN/Creatinine Ratio: 9 (ref 9–20)
BUN: 11 mg/dL (ref 6–24)
Bilirubin Total: 0.4 mg/dL (ref 0.0–1.2)
CO2: 22 mmol/L (ref 20–29)
Calcium: 9.9 mg/dL (ref 8.7–10.2)
Chloride: 107 mmol/L — ABNORMAL HIGH (ref 96–106)
Creatinine, Ser: 1.24 mg/dL (ref 0.76–1.27)
Globulin, Total: 2.4 g/dL (ref 1.5–4.5)
Glucose: 84 mg/dL (ref 70–99)
Potassium: 3.9 mmol/L (ref 3.5–5.2)
Sodium: 145 mmol/L — ABNORMAL HIGH (ref 134–144)
Total Protein: 6.5 g/dL (ref 6.0–8.5)
eGFR: 67 mL/min/{1.73_m2} (ref >59)

## 2023-06-09 LAB — T4, FREE: Free T4: 1.6 ng/dL (ref 0.82–1.77)

## 2023-06-09 LAB — TSH: TSH: 2.27 u[IU]/mL (ref 0.450–4.500)

## 2023-06-20 ENCOUNTER — Encounter: Payer: Self-pay | Admitting: Cardiology

## 2023-06-24 ENCOUNTER — Telehealth (HOSPITAL_COMMUNITY): Payer: Self-pay

## 2023-06-24 NOTE — Telephone Encounter (Signed)
Called and left patient a voice message to confirm/remind patient of their appointment at the [facility] on 06/27/23.

## 2023-06-24 NOTE — Progress Notes (Signed)
 Advanced [facility] Clinic Note    Patient ID: Gary Williamson, male   DOB: 11/18/1965, 58 y.o.   MRN: [id number] PCP: Nilda Simmer Cardiology: Dr. Shirlee Latch  58 y.o. with a h/o nonischemic cardiomyopathy, LBBB, HTN, VT and atrial fibrillation, who presents for followup of CHF.  Echo in 12/14 showed that his EF remained 20-25%.  He had a St Jude CRT-D system placed in 4/15.  Echo in 12/15 showed EF 25-30%, mild LV dilation, normal RV size and systolic function.    Echo in 7/21 showed EF up to 40-45%, diffuse hypokinesis, normal RV. Echo in 11/22 showed EF 35-40%, mild LV dilation.   Patient had ICD shock in 7/23 due to rapid atrial fibrillation (inappropriate).  In 8/23, he had VT with ATP then shock.  Device clinic was unable to contact him. He had been standing outside in the heat for a long period at a funeral and had not taken his meds that day.  No loss of consciousness or fall.   CPX in 10/23 showed mild functional limitation primarily due to body habitus.    Echo 12/23 showed EF 40%, mild LVH, mild LV dilation, mildly decreased RV systolic function.   Today he returns for HF follow up. Overall feeling fine. He has SOB walking up steep steps but otherwise no dyspnea. Denies palpitations, abnormal bleeding, CP, dizziness, edema, or PND/Orthopnea. Appetite ok. No fever or chills. Weight at home 328-329 pounds. Taking all medications. Works in [employer] in [place].  St Jude device interrogation (personally reviewed): CorVue labile, but daily impedence suggesting volume low, >99% BiV pacing, no AF/AT, no VT  ECG (personally reviewed from 06/08/23): A sensed V paced    Labs (12/23): K 3.6, creatinine 1.24, LFTs normal  Labs (4/24): LDL 100 Labs (2/25): K 3.9, creatinine 1.24, normal LFTs and TFTs  Allergies (verified):  No Known Drug Allergies  Past Medical History: 1. Cardiomyopathy: Nonischemic.  He had a left heart cath in 2002 or 2003 with no significant coronary disease per his report (this  was done in [place]). It sounds like his CMP has been present since about 2000.  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  Echo (5/11): severe LV dilation with EF < 20%, diffuse hypokinesis, moderate diastolic dysfunction, severe left atrial enlargement.  First hospitalization for CHF was in 5/11.  No drug use.  Adenosine myoview (6/11): EF 21%, diffuse hypokinesis, scar in the anterior and inferior walls and at the apex.  Mild peri-infarct ischemia.  LHC was done (7/11) showing EF 15%, global hypokinesis, LVEDP 22 mmHg, no angiographic CAD.  Echo (10/11): EF 20-25%, severely dilated LV with diffuse hypokinesis, mild MR.  Echo (12/14) with EF 20-25%, moderately dilated LV, diffuse hypokinesis, cannot rule out apical thrombus.  He was unable to tolerate cardiac MRI.  He had St Jude CRT-D implantation in 4/15. Echo (12/15) with EF 25-30%, mild LV dilation, mild LVH, normal RV size and systolic function.  - Echo (7/21): EF 40-45%, diffuse hypokinesis, normal RV.  - Echo (11/22): EF 35-40%, mild LV dilation. - CPX (10/23): peak VO2 15.7, RER 1.18, VE/VCO2 slope 31.  Mild functional limitation primarily due to obesity/deconditioning.  - Echo (12/23): EF 40%, mild LVH, mild LV dilation, mildly decreased RV systolic function.  2. Hypertension 3. Chronic left bundle branch block 4. Hyperlipidemia 5. Atrial fibrillation: Paroxysmal, noted on device interrogation.  6. VT  Family History: Family history is negative for premature diagnosis of coronary  artery disease or any arrhythmia or history of heart failure in first-degree relatives.  Mother and father do have a history of hypertension  Social History: The patient lives in [place] with his wife and 2 children.   He is a Data processing manager for [employer].  He denies significant tobacco, EtOH, nor any illicit drug use, herbal medication  use, regular diet, and no regular exercise  Review of Systems         All systems reviewed and negative except as per HPI.   Current Outpatient Medications  Medication Sig Dispense Refill   amiodarone (PACERONE) 200 MG tablet Take 1 tablet (200 mg total) by mouth daily. 90 tablet 3   apixaban (ELIQUIS) 5 MG TABS tablet Take 1 tablet (5 mg total) by mouth 2 (two) times daily. 180 tablet 3   blood glucose meter kit and supplies Dispense based on patient and insurance preference. Check blood sugar every morning before eating and up to three additional times a day as needed (FOR ICD-10 E10.9, E11.9). 1 each 0   Cholecalciferol (VITAMIN D3) 1.25 MG (50000 UT) TABS Take 1 tablet by mouth once a week. 12 tablet 3   dapagliflozin propanediol (FARXIGA) 10 MG TABS tablet Take 1 tablet (10 mg total) by mouth daily. 90 tablet 3   fish oil-omega-3 fatty acids 1000 MG capsule Take 1 capsule (1 g total) by mouth daily. 30 capsule 2   Lancets (ONETOUCH DELICA PLUS LANCET33G) MISC USE 1 LANCET FOR FINGERSTICK TO CHECK GLUCOSE IN THE MORNING BEFORE  EATING  AND  UP  TO  3  ADDITIONAL  TIMES  PER  DAY  AS  NEEDED 100 each 0   metoprolol succinate (TOPROL-XL) 100 MG 24 hr tablet Take 1 tablet (100 mg total) by mouth 2 (two) times daily. 60 tablet 0   Multiple Vitamin (MULTIVITAMIN) tablet Take 1 tablet by mouth daily.     ONETOUCH VERIO test strip USE 1 STRIP TO CHECK GLUCOSE ONCE DAILY IN THE MORNING BEFORE EATING AND UP TO THREE ADDITIONAL TIMES DAILY AS NEEDED 100 each 0   potassium chloride SA (KLOR-CON M) 20 MEQ tablet Take 2 tablets (40 mEq total) by mouth daily. 180 tablet 3   rosuvastatin (CRESTOR) 20 MG tablet Take 2 tabs (40mg ) by mouth Monday, Wednesday, and Friday. Take 1 tab (20mg ) by mouth Sunday, Tuesday, Thursday, and Saturday. For cholesterol. 180 tablet 3   sacubitril-valsartan (ENTRESTO) 97-103 MG Take 1 tablet by mouth twice daily 60 tablet 5   spironolactone (ALDACTONE) 25 MG tablet Take 1 tablet (25 mg total) by mouth daily. 90 tablet 3   tirzepatide (MOUNJARO)  15 MG/0.5ML Pen Inject 15 mg into the skin once a week. 6 mL 1   torsemide (DEMADEX) 20 MG tablet Take 2 tablets (40 mg total) by mouth daily. 60 tablet 0   No current facility-administered medications for this encounter.   Wt Readings from Last 3 Encounters:  06/27/23 (!) 152 kg (335 lb)  06/08/23 (!) 156.5 kg (345 lb)  03/23/23 (!) 152.1 kg (335 lb 6.4 oz)   BP 139/84   Pulse 86   Wt (!) 152 kg (335 lb)   SpO2 96%   BMI 44.20 kg/m   PHYSICAL EXAM: General:  NAD. No resp difficulty, walked into clinic HEENT: Normal Neck: Supple. No JVD. Cor: Regular rate & rhythm. No rubs, gallops or murmurs. Lungs: Clear Abdomen: Soft, obese, nontender, nondistended.  Extremities: No cyanosis, clubbing, rash, edema Neuro: Alert &  oriented x 3, moves all 4 extremities w/o difficulty. Affect pleasant.  Assessment/Plan: 1. Nonischemic cardiomyopathy: Most recent echo 12/23: EF 40% (stable), mild LVH, mild LV dilation, mildly decreased RV systolic function. CPX in 10/23 showed mild functional limitation primarily due to body habitus. He has a Secondary school teacher CRT-D system. Stable NYHA II, he is not volume overloaded by exam or device interrogation. - Continue torsemide 40 mg daily + 40 KCL daily. Labs reviewed from 06/08/23 and are stable, K 3.9, SCr 1.24 - Continue Toprol XL 100 mg bid.  - Continue Entresto 97/103 bid  - Continue spironolactone 25 mg daily.  - Continue dapagliflozin 10 mg daily.  - Did not tolerate Bidil (headaches) - Update echo at next appt. 2. Obesity: Body mass index is 44.2 kg/m. Needs significant weight loss.    - Now on tirzepatide Greggory Keen), followed by PharmD clinic  3. Hyperlipidemia: Continue Crestor. - Check lipids with next lab draw. 4. Atrial fibrillation: Paroxysmal. No recent episodes on device interrogation today .  - Continue amiodarone 200 mg daily. LFTs and TSH reviewed from 06/08/23 and are stable. Needs regular eye exam - Continue apixaban 5 mg bid. No bleeding  issues. CBC at next lab draw 5. VT: Episode in 8/23.  He had 2 shocks, 1 inappropriate due to AF and the other appropriate. Not associated with chest pain, doubt ACS. Device interrogation today shows no VT  - Continue Toprol XL. - Continue amiodarone 200 mg daily. Needs regular eye exam (annual exam scheduled for tomorrow).  - Followed by EP   Follow up in 4 months with Dr. Shirlee Latch + echo  Anderson Malta Gerber, [place] 06/27/2023

## 2023-06-27 ENCOUNTER — Ambulatory Visit (HOSPITAL_COMMUNITY)
Admission: RE | Admit: 2023-06-27 | Discharge: 2023-06-27 | Disposition: A | Discharge status: Home / Self Care | Payer: BC Managed Care – PPO | Source: Ambulatory Visit | Attending: Family Medicine | Admitting: Family Medicine

## 2023-06-27 ENCOUNTER — Encounter (HOSPITAL_COMMUNITY): Payer: Self-pay

## 2023-06-27 VITALS — BP 139/84 | HR 86 | Wt 335.0 lb

## 2023-06-27 DIAGNOSIS — I5022 Chronic systolic (congestive) heart failure: Secondary | ICD-10-CM

## 2023-06-27 DIAGNOSIS — I480 Paroxysmal atrial fibrillation: Secondary | ICD-10-CM | POA: Diagnosis not present

## 2023-06-27 DIAGNOSIS — I110 Hypertensive heart disease with heart failure: Secondary | ICD-10-CM | POA: Diagnosis not present

## 2023-06-27 DIAGNOSIS — Z4502 Encounter for adjustment and management of automatic implantable cardiac defibrillator: Secondary | ICD-10-CM | POA: Insufficient documentation

## 2023-06-27 DIAGNOSIS — E785 Hyperlipidemia, unspecified: Secondary | ICD-10-CM | POA: Diagnosis not present

## 2023-06-27 DIAGNOSIS — Z7985 Long-term (current) use of injectable non-insulin antidiabetic drugs: Secondary | ICD-10-CM | POA: Diagnosis not present

## 2023-06-27 DIAGNOSIS — Z7984 Long term (current) use of oral hypoglycemic drugs: Secondary | ICD-10-CM | POA: Diagnosis not present

## 2023-06-27 DIAGNOSIS — Z7901 Long term (current) use of anticoagulants: Secondary | ICD-10-CM | POA: Diagnosis not present

## 2023-06-27 DIAGNOSIS — I472 Ventricular tachycardia, unspecified: Secondary | ICD-10-CM

## 2023-06-27 DIAGNOSIS — E669 Obesity, unspecified: Secondary | ICD-10-CM | POA: Diagnosis not present

## 2023-06-27 DIAGNOSIS — I428 Other cardiomyopathies: Secondary | ICD-10-CM | POA: Diagnosis not present

## 2023-06-27 DIAGNOSIS — Z6841 Body Mass Index (BMI) 40.0 and over, adult: Secondary | ICD-10-CM | POA: Insufficient documentation

## 2023-06-27 DIAGNOSIS — Z79899 Other long term (current) drug therapy: Secondary | ICD-10-CM | POA: Insufficient documentation

## 2023-06-27 DIAGNOSIS — I10 Essential (primary) hypertension: Secondary | ICD-10-CM

## 2023-06-27 MED ORDER — TORSEMIDE 20 MG PO TABS: 40.0000 mg | ORAL_TABLET | Freq: Every day | ORAL | 6 refills | Status: AC

## 2023-06-27 MED ORDER — METOPROLOL SUCCINATE ER 100 MG PO TB24: 100.0000 mg | ORAL_TABLET | Freq: Two times a day (BID) | ORAL | 6 refills | Status: DC

## 2023-06-27 NOTE — Patient Instructions (Signed)
 Great to see you today!!!  Medication Changes:  None, continue current medications  Testing/Procedures:  Your physician has requested that you have an echocardiogram. Echocardiography is a painless test that uses sound waves to create images of your heart. It provides your doctor with information about the size and shape of your heart and how well your heart's chambers and valves are working. This procedure takes approximately one hour. There are no restrictions for this procedure. Please do NOT wear cologne, perfume, aftershave, or lotions (deodorant is allowed). Please arrive 15 minutes prior to your appointment time. IN 4 MONTHS WITH FOLLOW-UP APPOINTMENT  Please note: We ask at that you not bring children with you during ultrasound (echo/ vascular) testing. Due to room size and safety concerns, children are not allowed in the ultrasound rooms during exams. Our front office staff cannot provide observation of children in our lobby area while testing is being conducted. An adult accompanying a patient to their appointment will only be allowed in the ultrasound room at the discretion of the ultrasound technician under special circumstances. We apologize for any inconvenience.  Special Instructions // Education:  Do the following things EVERYDAY: Weigh yourself in the morning before breakfast. Write it down and keep it in a log. Take your medicines as prescribed Eat low salt foods--Limit salt (sodium) to 2000 mg per day.  Stay as active as you can everyday Limit all fluids for the day to less than 2 liters   Follow-Up in: 4 months with an echocardiogram (June), **PLEASE CALL OUR OFFICE IN APRIL TO SCHEDULE THIS APPOINTMENT    At the [facility], you and your health needs are our priority. We have a designated team specialized in the treatment of Heart Failure. This Care Team includes your primary Heart Failure Specialized Cardiologist (physician), Advanced Practice  Providers (APPs- Physician Assistants and Nurse Practitioners), and Pharmacist who all work together to provide you with the care you need, when you need it.   You may see any of the following providers on your designated Care Team at your next follow up:  Dr. Arvilla Meres Dr. Marca Ancona Dr. Dorthula Nettles Dr. Theresia Bough Tonye Becket, NP Robbie Lis, [place] [facility] [place], [place] Brynda Peon, NP [place] Lee, NP Karle Plumber, PharmD   Please be sure to bring in all your medications bottles to every appointment.   Need to Contact us:  If you have any questions or concerns before your next appointment please send [name] a message through [place] or call our office at [phone].    TO LEAVE A MESSAGE FOR THE NURSE SELECT OPTION 2, PLEASE LEAVE A MESSAGE INCLUDING: YOUR NAME DATE OF BIRTH CALL BACK NUMBER REASON FOR CALL**this is important as we prioritize the call backs  YOU WILL RECEIVE A CALL BACK THE SAME DAY AS LONG AS YOU CALL BEFORE 4:00 PM

## 2023-07-11 ENCOUNTER — Ambulatory Visit: Payer: BC Managed Care – PPO | Attending: Cardiology

## 2023-07-11 DIAGNOSIS — Z95810 Presence of automatic (implantable) cardiac defibrillator: Secondary | ICD-10-CM | POA: Diagnosis not present

## 2023-07-11 DIAGNOSIS — I5022 Chronic systolic (congestive) heart failure: Secondary | ICD-10-CM | POA: Diagnosis not present

## 2023-07-11 NOTE — Progress Notes (Signed)
 Remote ICD transmission.

## 2023-07-12 ENCOUNTER — Encounter: Payer: Self-pay | Admitting: Cardiology

## 2023-07-12 NOTE — Progress Notes (Signed)
 EPIC Encounter for ICM Monitoring  Patient Name: Gary Williamson is a 58 y.o. male Date: 07/12/2023 Primary Care Physican: Tollie Eth, NP Primary Cardiologist: Shirlee Latch Electrophysiologist: Townsend Roger Pacing: >99%     04/05/2022 Office Weight: 342 lbs 02/28/2023 Office Weight: 335 lbs 05/09/2023 Weight: 335 lbs 06/09/2023 Office Weight: 345 lbs 06/27/2023 Office Weight: 335 lbs   AT/AF Burden: 0% (taking Eliquis)    Attempted call to patient and unable to reach.  Left detailed message per DPR regarding transmission.  Transmission results reviewed.    Diet:  05/09/2023 he reports he may be drinking more fluid on some days and greater than recommended amount of 64 oz daily.   Corvue Thoracic impedance suggesting possible fluid accumulation starting 3/5.  Impedance continues with ongoing pattern of possible fluid accumulation for 3-4 days alternating with possible dryness 3-4 days for the last 6 months.     Prescribed: Torsemide 20 mg take 2 tablets (40 mg total) daily.   Potassium 20 mEq take 2 tablets daily Spironolactone 25 mg take 1 tablet daily   Labs: 06/08/2023 Creatinine 1.24, BUN 11, Potassium 3.9, Sodium 145, GFR 67 08/13/2022 Creatinine 1.34, BUN 13, Potassium 3.8, Sodium 140, GFR >60 08/05/2022 Creatinine 1.55, BUN 12, Potassium 3.4, Sodium 142, GFR 52 A complete set of results can be found in Results Review.   Recommendations:  Left voice mail with ICM number and encouraged to call if experiencing any fluid symptoms.   Follow-up plan: ICM clinic phone appointment on 07/18/2023 to recheck fluid levels.   91 day device clinic remote transmission 09/01/2023.     EP/Cardiology Office Visits:  Recall 10/07/2023 with Dr Shirlee Latch.   Recall 12/05/2023 with EP APP.   Copy of ICM check sent to Dr. Lalla Brothers.  Will send to Dr Shirlee Latch for review if patient is reached.  3 month ICM trend: 07/11/2023.    12-14 Month ICM trend:     Karie Soda, RN 07/12/2023 2:01 PM

## 2023-07-18 ENCOUNTER — Ambulatory Visit: Attending: Cardiology

## 2023-07-18 DIAGNOSIS — I5022 Chronic systolic (congestive) heart failure: Secondary | ICD-10-CM

## 2023-07-18 DIAGNOSIS — Z95810 Presence of automatic (implantable) cardiac defibrillator: Secondary | ICD-10-CM

## 2023-07-18 NOTE — Progress Notes (Signed)
 EPIC Encounter for ICM Monitoring  Patient Name: Gary Williamson is a 58 y.o. male Date: 07/18/2023 Primary Care Physican: Tollie Eth, NP Primary Cardiologist: Shirlee Latch Electrophysiologist: Townsend Roger Pacing: >99%     04/05/2022 Office Weight: 342 lbs 02/28/2023 Office Weight: 335 lbs 05/09/2023 Weight: 335 lbs 06/09/2023 Office Weight: 345 lbs   AT/AF Burden: 0% (taking Eliquis)    Transmission results reviewed.    Diet:  05/09/2023 he reports he may be drinking more fluid on some days and greater than recommended amount of 64 oz daily.   [place] Thoracic impedance suggesting ongoing repetitive pattern of possible fluid accumulation for 3-4 days alternating with possible dryness 3-4 days for the last 6 months.     Prescribed: Torsemide 20 mg take 2 tablets (40 mg total) daily.  Confirmed 05/09/2023 he takes Torsemide 40 mg daily. Potassium 20 mEq take 1 tablet daily Spironolactone 25 mg take 1 tablet daily   Labs: 08/13/2022 Creatinine 1.34, BUN 13, Potassium 3.8, Sodium 140, GFR >60 08/05/2022 Creatinine 1.55, BUN 12, Potassium 3.4, Sodium 142, GFR 52 A complete set of results can be found in Results Review.   Recommendations:  No changes.   Follow-up plan: ICM clinic phone appointment on 08/15/2023.   91 day device clinic remote transmission 09/01/2023.     EP/Cardiology Office Visits:     Recall 10/27/2023 with Dr Shirlee Latch.   Recall 12/05/2023 with Dr Lalla Brothers or APP.   Copy of ICM check sent to Dr. Lalla Brothers.     3 month ICM trend: 07/18/2023.    12-14 Month ICM trend:     Karie Soda, RN 07/18/2023 3:41 PM

## 2023-08-15 ENCOUNTER — Ambulatory Visit: Attending: Cardiology

## 2023-08-15 DIAGNOSIS — I5022 Chronic systolic (congestive) heart failure: Secondary | ICD-10-CM | POA: Diagnosis not present

## 2023-08-15 DIAGNOSIS — Z95810 Presence of automatic (implantable) cardiac defibrillator: Secondary | ICD-10-CM

## 2023-08-19 NOTE — Progress Notes (Signed)
 EPIC Encounter for ICM Monitoring  Patient Name: Gary Williamson is a 57 y.o. male Date: 08/19/2023 Primary Care Physican: Annella Kief, NP Primary Cardiologist: Mitzie Anda Electrophysiologist: Kasandra Pain Pacing: >99%     04/05/2022 Office Weight: 342 lbs 02/28/2023 Office Weight: 335 lbs 05/09/2023 Weight: 335 lbs 06/09/2023 Office Weight: 345 lbs   AT/AF Burden: 0% (taking Eliquis )    Transmission results reviewed.    Diet:  05/09/2023 he reports he may be drinking more fluid on some days and greater than recommended amount of 64 oz daily.   [place] Thoracic impedance suggesting ongoing repetitive pattern of possible fluid accumulation for 3-4 days alternating with possible dryness 3-4 days for the last 6 months.     Prescribed: Torsemide  20 mg take 2 tablets (40 mg total) daily.  Confirmed 05/09/2023 he takes Torsemide  40 mg daily. Potassium 20 mEq take 1 tablet daily Spironolactone  25 mg take 1 tablet daily   Labs: 08/13/2022 Creatinine 1.34, BUN 13, Potassium 3.8, Sodium 140, GFR >60 08/05/2022 Creatinine 1.55, BUN 12, Potassium 3.4, Sodium 142, GFR 52 A complete set of results can be found in Results Review.   Recommendations:  No changes.   Follow-up plan: ICM clinic phone appointment on 09/19/2023.   91 day device clinic remote transmission 09/01/2023.     EP/Cardiology Office Visits:     Recall 10/27/2023 with Dr Mitzie Anda.   Recall 12/05/2023 with Dr Marven Slimmer or APP.   Copy of ICM check sent to Dr. Marven Slimmer.     3 month ICM trend: 08/15/2023.    12-14 Month ICM trend:     Almyra Jain, RN 08/19/2023 12:22 PM

## 2023-08-30 ENCOUNTER — Ambulatory Visit (INDEPENDENT_AMBULATORY_CARE_PROVIDER_SITE_OTHER): Payer: BC Managed Care – PPO | Admitting: Nurse Practitioner

## 2023-08-30 ENCOUNTER — Encounter: Payer: Self-pay | Admitting: Nurse Practitioner

## 2023-08-30 VITALS — BP 122/82 | HR 101 | Ht 72.0 in | Wt 333.6 lb

## 2023-08-30 DIAGNOSIS — E1159 Type 2 diabetes mellitus with other circulatory complications: Secondary | ICD-10-CM

## 2023-08-30 DIAGNOSIS — E1165 Type 2 diabetes mellitus with hyperglycemia: Secondary | ICD-10-CM

## 2023-08-30 DIAGNOSIS — E66813 Obesity, class 3: Secondary | ICD-10-CM

## 2023-08-30 DIAGNOSIS — Z125 Encounter for screening for malignant neoplasm of prostate: Secondary | ICD-10-CM

## 2023-08-30 DIAGNOSIS — Z0000 Encounter for general adult medical examination without abnormal findings: Secondary | ICD-10-CM

## 2023-08-30 DIAGNOSIS — E1169 Type 2 diabetes mellitus with other specified complication: Secondary | ICD-10-CM | POA: Diagnosis not present

## 2023-08-30 DIAGNOSIS — D6869 Other thrombophilia: Secondary | ICD-10-CM

## 2023-08-30 DIAGNOSIS — I480 Paroxysmal atrial fibrillation: Secondary | ICD-10-CM

## 2023-08-30 DIAGNOSIS — E559 Vitamin D deficiency, unspecified: Secondary | ICD-10-CM

## 2023-08-30 DIAGNOSIS — E876 Hypokalemia: Secondary | ICD-10-CM

## 2023-08-30 DIAGNOSIS — I428 Other cardiomyopathies: Secondary | ICD-10-CM | POA: Diagnosis not present

## 2023-08-30 DIAGNOSIS — R7989 Other specified abnormal findings of blood chemistry: Secondary | ICD-10-CM

## 2023-08-30 DIAGNOSIS — Z95810 Presence of automatic (implantable) cardiac defibrillator: Secondary | ICD-10-CM

## 2023-08-30 DIAGNOSIS — Z23 Encounter for immunization: Secondary | ICD-10-CM | POA: Diagnosis not present

## 2023-08-30 DIAGNOSIS — I152 Hypertension secondary to endocrine disorders: Secondary | ICD-10-CM

## 2023-08-30 DIAGNOSIS — I5022 Chronic systolic (congestive) heart failure: Secondary | ICD-10-CM

## 2023-08-30 DIAGNOSIS — I4891 Unspecified atrial fibrillation: Secondary | ICD-10-CM

## 2023-08-30 DIAGNOSIS — G4733 Obstructive sleep apnea (adult) (pediatric): Secondary | ICD-10-CM

## 2023-08-30 LAB — LIPID PANEL

## 2023-08-30 MED ORDER — ROSUVASTATIN CALCIUM 20 MG PO TABS: ORAL_TABLET | ORAL | 3 refills | Status: AC

## 2023-08-30 MED ORDER — VITAMIN D (ERGOCALCIFEROL) 1.25 MG (50000 UNIT) PO CAPS
50000.0000 [IU] | ORAL_CAPSULE | ORAL | 3 refills | Status: AC
Start: 2023-08-30 — End: ?

## 2023-08-30 MED ORDER — ENTRESTO 97-103 MG PO TABS: 1.0000 | ORAL_TABLET | Freq: Two times a day (BID) | ORAL | 3 refills | Status: AC

## 2023-08-30 MED ORDER — APIXABAN 5 MG PO TABS: 5.0000 mg | ORAL_TABLET | Freq: Two times a day (BID) | ORAL | 3 refills | Status: AC

## 2023-08-30 MED ORDER — AMIODARONE HCL 200 MG PO TABS: 200.0000 mg | ORAL_TABLET | Freq: Every day | ORAL | 3 refills | Status: AC

## 2023-08-30 MED ORDER — SPIRONOLACTONE 25 MG PO TABS: 25.0000 mg | ORAL_TABLET | Freq: Every day | ORAL | 3 refills | Status: AC

## 2023-08-30 MED ORDER — MOUNJARO 15 MG/0.5ML ~~LOC~~ SOAJ
15.0000 mg | SUBCUTANEOUS | 3 refills | Status: AC
Start: 2023-08-30 — End: ?

## 2023-08-30 MED ORDER — DAPAGLIFLOZIN PROPANEDIOL 10 MG PO TABS: 10.0000 mg | ORAL_TABLET | Freq: Every day | ORAL | 3 refills | Status: AC

## 2023-08-30 MED ORDER — POTASSIUM CHLORIDE CRYS ER 20 MEQ PO TBCR: 40.0000 meq | EXTENDED_RELEASE_TABLET | Freq: Every day | ORAL | 3 refills | Status: AC

## 2023-08-30 NOTE — Assessment & Plan Note (Signed)
Chronic. Currently on statin therapy and mounjaro for blood sugar and cardiovascular control. No symptoms present. Labs pending.

## 2023-08-30 NOTE — Assessment & Plan Note (Signed)
 Defibrillator functioning without issues. Cardiology follow-up is due, date uncertain.

## 2023-08-30 NOTE — Assessment & Plan Note (Signed)
 Blood glucose levels are well-controlled. He tolerates Mounjaro  well. No numbness, tingling, wounds. Will monitor labs today.

## 2023-08-30 NOTE — Assessment & Plan Note (Signed)
 Blood pressure is well-controlled. No changes to management. Refills provided.

## 2023-08-30 NOTE — Assessment & Plan Note (Signed)
Chronic afib, currently on anticoagulation. HR controlled today. No alarm symptoms present. Plan to continue eliquis and monitor closely. He is not having any excessive bruising or signs of bleeding.  Plan: - Labs today - Continue eliquis - notify immediately if bleeding or excessive bruising starts

## 2023-08-30 NOTE — Assessment & Plan Note (Signed)
 Repeat labs today and refill provided.

## 2023-08-30 NOTE — Assessment & Plan Note (Signed)
 Labs monitored today. No alarm symptoms at this time.

## 2023-08-30 NOTE — Progress Notes (Signed)
 BP 122/82   Pulse (!) 101   Ht 6' (1.829 m)   Wt (!) 333 lb 9.6 oz (151.3 kg)   BMI 45.24 kg/m    Subjective:    Patient ID: Gary Williamson, male    DOB: 13-May-1965, 58 y.o.   MRN: [id number]  HPI: Gary Williamson is a 58 y.o. male presenting on 08/30/2023 for comprehensive medical examination.   History of Present Illness Gary Williamson is a 58 year old male with a history of defibrillator use and diabetes who presents for a routine follow-up visit.  He has a history of defibrillator use and reports no issues with it. He denies any bleeding problems, chest pain, shortness of breath, or swelling in his feet or ankles. He also denies fullness in his throat or difficulty swallowing.  His diabetes is well-controlled with stable blood sugars, and he is tolerating Mounjaro  well. He denies numbness, tingling, or burning in his feet.  Recently, he sustained a foot injury after dropping a drill on it, resulting in a couple of broken bones. He attributes the incident to rushing.  He is currently taking several medications, including Eliquis , spironolactone , Entresto , and a potassium supplement. He mentions having an Eliquis  card that reduces the cost to ten dollars.  He has not received the shingles vaccine yet. He recalls having one pneumonia vaccine in 2009.  His mother had shingles, which she described as more painful than childbirth. He has two children, one attending [employer] and the other at [facility].  Pertinent items are noted in HPI.  Most Recent Depression Screen:     08/30/2023    8:22 AM 08/27/2022    9:44 AM 08/25/2021    8:18 AM 11/27/2020   12:19 PM 08/18/2020    2:14 PM  Depression screen PHQ 2/9  Decreased Interest 0 0 0 0 0  Down, Depressed, Hopeless 0 0 0 0 0  PHQ - 2 Score 0 0 0 0 0  Altered sleeping     0  Tired, decreased energy     0  Change in appetite     0  Feeling bad or failure about yourself      0  Trouble concentrating     0  Moving  slowly or fidgety/restless     0  Suicidal thoughts     0  PHQ-9 Score     0   Most Recent Anxiety Screen:    08/18/2020    2:15 PM  GAD 7 : Generalized Anxiety Score  Nervous, Anxious, on Edge 0  Control/stop worrying 0  Worry too much - different things 0  Trouble relaxing 0  Restless 0  Easily annoyed or irritable 0  Afraid - awful might happen 0  Total GAD 7 Score 0   Most Recent Falls Screen:    08/30/2023    8:21 AM 08/27/2022    9:44 AM 09/15/2021    9:59 AM 08/25/2021    8:18 AM 11/27/2020   12:19 PM  Fall Risk   Falls in the past year? 0 0 0 0 0  Number falls in past yr: 0 0 0 0 0  Injury with Fall? 0 0 0 0 0  Risk for fall due to : No Fall Risks No Fall Risks No Fall Risks No Fall Risks No Fall Risks  Follow up Falls evaluation completed Falls evaluation completed Falls evaluation completed;Education provided Education provided;Falls evaluation completed Falls evaluation completed    Past medical history, surgical history,  medications, allergies, family history and social history reviewed with patient today and changes made to appropriate areas of the chart.  Past Medical History:  Past Medical History:  Diagnosis Date   Acute idiopathic pericarditis 03/24/2010   Overview:  Viral Idiopathic Pericarditis    Body mass index (BMI) of 45.0-49.9 in adult [facility]) 08/18/2020   Cellulitis of right axilla 02/28/2023   CHF (congestive heart failure) (HCC)    Chronic systolic heart failure (HCC)    Colon cancer screening    Encounter to establish care 08/18/2020   Hematuria, microscopic 05/15/2013   05/15/2013 visit with Mark Ottelin, MD at [facility]. Normal CT scan. Recommended annual urine for microscopy and serum creatinine x 3 years. 04/20/2013 visit with Mark Ottelin, MD at [facility].    Hypertension    LBBB (left bundle branch block)    Chronic   Leg edema    NICM (nonischemic cardiomyopathy) (HCC)    Probably nonischemic; LHC in  2002/03 no sig CAD per report (done in [place]). Cardiomyopathy for 9-10 yrs. 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 Echo 5/11: severe LVE, EF <20%, diff HK, mod dias dys, sev. LAE. Hosp for CHF in 5/11. Ad. MV 6/11: EF 21%, mild per-infart ischemia. LHC (7/11): EF 15%, no CAD, LVEDP 22; Echo (10/11): EF 20-25%;  Echo (12/12):  EF 20%, mod LVH, Gr 1 DD, diff HK, mild LAE   Nonspecific abnormal results of cardiovascular function study 11/04/2009   Qualifier: Diagnosis of   By: Mitzie Anda, MD, [place]      IMO SNOMED Dx Update Oct 2024     OSA (obstructive sleep apnea)    cpap not  used   Prediabetes 08/18/2020   Pure hypercholesterolemia    Pyogenic abscess 03/28/2023   Sebaceous cyst of axilla 02/28/2023   Severe obesity (HCC)    Sleep apnea    Vitamin D  deficiency    Medications:  Current Outpatient Medications on File Prior to Visit  Medication Sig   blood glucose meter kit and supplies Dispense based on patient and insurance preference. Check blood sugar every morning before eating and up to three additional times a day as needed (FOR ICD-10 E10.9, E11.9).   fish oil-omega-3 fatty acids  1000 MG capsule Take 1 capsule (1 g total) by mouth daily.   Lancets (ONETOUCH DELICA PLUS LANCET33G) MISC USE 1 LANCET FOR FINGERSTICK TO CHECK GLUCOSE IN THE MORNING BEFORE  EATING  AND  UP  TO  3  ADDITIONAL  TIMES  PER  DAY  AS  NEEDED   metoprolol  succinate (TOPROL -XL) 100 MG 24 hr tablet Take 1 tablet (100 mg total) by mouth 2 (two) times daily.   Multiple Vitamin (MULTIVITAMIN) tablet Take 1 tablet by mouth daily.   ONETOUCH VERIO test strip USE 1 STRIP TO CHECK GLUCOSE ONCE DAILY IN THE MORNING BEFORE EATING AND UP TO THREE ADDITIONAL TIMES DAILY AS NEEDED   torsemide  (DEMADEX ) 20 MG tablet Take 2 tablets (40 mg total) by mouth daily.   No current facility-administered medications on file prior to visit.   Surgical History:  Past Surgical History:  Procedure Laterality Date   BI-VENTRICULAR IMPLANTABLE  CARDIOVERTER DEFIBRILLATOR N/A 08/21/2013   Procedure: BI-VENTRICULAR IMPLANTABLE CARDIOVERTER DEFIBRILLATOR  (CRT-D);  Surgeon: Ellaree Gunther, MD;  Location: [facility] CATH LAB;  Service: Cardiovascular;  Laterality: N/A;   BIV ICD GENERATOR CHANGEOUT N/A 08/21/2021   Procedure: BIV ICD GENERATOR CHANGEOUT;  Surgeon: Jolly Needle, MD;  Location: [facility] INVASIVE CV LAB;  Service: Cardiovascular;  Laterality: N/A;   CARDIAC CATHETERIZATION  2002 or 2003   COLONOSCOPY N/A 09/16/2014   Procedure: COLONOSCOPY;  Surgeon: Tobin Forts, MD;  Location: [facility] ENDOSCOPY;  Service: Endoscopy;  Laterality: N/A;   COLONOSCOPY     LEFT HEART CATH  11/13/2009   Dr Mitzie Anda   POLYPECTOMY     Allergies:  No Known Allergies Social History:  Social History   Socioeconomic History   Marital status: Married    Spouse name: Felicia   Number of children: 2   Years of education: Not on file   Highest education level: Not on file  Occupational History   Occupation: Interior and spatial designer of Presenter, broadcasting for [employer]: OTHER    Comment: Full-time    Employer: [employer]  Tobacco Use   Smoking status: Never   Smokeless tobacco: Never  Substance and Sexual Activity   Alcohol use: Yes    Alcohol/week: 0.0 - 2.0 standard drinks of alcohol    Comment: rare to occ.   Drug use: No   Sexual activity: Yes    Birth control/protection: Condom  Other Topics Concern   Not on file  Social History Narrative   Marital status: married x 17 years; happily married      Children: 2 children (15,12)      Lives in [place] with his wife and 2 children      Employment: [employer]; Chiropodist x 12 years; likes work.  Lots of traveling.       Tobacco: none      Alcohol:  1 drink per week      Exercise:  Walking three days per week; 20 minutes.       Seatbelt: 100%   Social Drivers of Corporate investment banker Strain: Low Risk  (08/30/2023)   Overall Financial Resource Strain (CARDIA)    Difficulty of Paying Living Expenses: Not hard  at all  Food Insecurity: No Food Insecurity (08/30/2023)   Hunger Vital Sign    Worried About Running Out of Food in the Last Year: Never true    Ran Out of Food in the Last Year: Never true  Transportation Needs: No Transportation Needs (08/30/2023)   PRAPARE - Administrator, Civil Service (Medical): No    Lack of Transportation (Non-Medical): No  Physical Activity: Insufficiently Active (08/30/2023)   Exercise Vital Sign    Days of Exercise per Week: 2 days    Minutes of Exercise per Session: 20 min  Stress: No Stress Concern Present (08/30/2023)   [employer] of Occupational Health - Occupational Stress Questionnaire    Feeling of Stress : Not at all  Social Connections: Moderately Integrated (08/30/2023)   Social Connection and Isolation Panel [NHANES]    Frequency of Communication with Friends and Family: More than three times a week    Frequency of Social Gatherings with Friends and Family: Once a week    Attends Religious Services: More than 4 times per year    Active Member of [employer] or Organizations: No    Attends Banker Meetings: Patient unable to answer    Marital Status: Married  Catering manager Violence: Not At Risk (08/30/2023)   Humiliation, Afraid, Rape, and Kick questionnaire    Fear of Current or Ex-Partner: No    Emotionally Abused: No    Physically Abused: No    Sexually Abused: No   Social History   Tobacco Use  Smoking Status Never  Smokeless  Tobacco Never   Social History   Substance and Sexual Activity  Alcohol Use Yes   Alcohol/week: 0.0 - 2.0 standard drinks of alcohol   Comment: rare to occ.   Family History:  Family History  Problem Relation Age of Onset   Hypertension Mother    Alzheimer's disease Mother    Hyperlipidemia Mother    Breast cancer Mother    Arthritis Father    Dementia Father    Hypertension Father    Alzheimer's disease Father    COPD Brother    Stroke Maternal Grandmother    Coronary  artery disease Neg Hx    Heart failure Neg Hx    Heart disease Neg Hx    Colon cancer Neg Hx    Colon polyps Neg Hx    Esophageal cancer Neg Hx    Rectal cancer Neg Hx    Stomach cancer Neg Hx        Objective:    BP 122/82   Pulse (!) 101   Ht 6' (1.829 m)   Wt (!) 333 lb 9.6 oz (151.3 kg)   BMI 45.24 kg/m   Wt Readings from Last 3 Encounters:  08/30/23 (!) 333 lb 9.6 oz (151.3 kg)  06/27/23 (!) 335 lb (152 kg)  06/08/23 (!) 345 lb (156.5 kg)    Physical Exam Vitals and nursing note reviewed.  Constitutional:      General: He is not in acute distress.    Appearance: Normal appearance. He is obese. He is not ill-appearing.  HENT:     Head: Normocephalic.     Right Ear: Tympanic membrane, ear canal and external ear normal.     Left Ear: Tympanic membrane, ear canal and external ear normal.     Nose: Nose normal.     Mouth/Throat:     Mouth: Mucous membranes are moist.     Pharynx: Oropharynx is clear.  Eyes:     Conjunctiva/sclera: Conjunctivae normal.     Pupils: Pupils are equal, round, and reactive to light.  Neck:     Vascular: No carotid bruit.  Cardiovascular:     Rate and Rhythm: Regular rhythm. Tachycardia present.     Pulses: Normal pulses.     Heart sounds: Normal heart sounds.  Pulmonary:     Effort: Pulmonary effort is normal.     Breath sounds: Normal breath sounds. No wheezing.  Abdominal:     General: Bowel sounds are normal. There is no distension.     Tenderness: There is no abdominal tenderness. There is no right CVA tenderness, left CVA tenderness or guarding.  Musculoskeletal:        General: Normal range of motion.     Cervical back: Normal range of motion.     Right lower leg: No edema.     Left lower leg: No edema.  Lymphadenopathy:     Cervical: No cervical adenopathy.  Skin:    General: Skin is warm and dry.     Capillary Refill: Capillary refill takes less than 2 seconds.  Neurological:     Mental Status: He is alert and oriented  to person, place, and time.     Sensory: No sensory deficit.     Motor: No weakness.     Gait: Gait normal.  Psychiatric:        Mood and Affect: Mood normal.        Behavior: Behavior normal.      Results for orders placed or performed in  visit on 06/08/23  T4, free   Collection Time: 06/08/23 10:34 AM  Result Value Ref Range   Free T4 1.60 0.82 - 1.77 ng/dL  TSH   Collection Time: 06/08/23 10:34 AM  Result Value Ref Range   TSH 2.270 0.450 - 4.500 uIU/mL  Comprehensive metabolic panel   Collection Time: 06/08/23 10:34 AM  Result Value Ref Range   Glucose 84 70 - 99 mg/dL   BUN 11 6 - 24 mg/dL   Creatinine, Ser 0.98 0.76 - 1.27 mg/dL   eGFR 67 >11 BJ/YNW/2.95   BUN/Creatinine Ratio 9 9 - 20   Sodium 145 (H) 134 - 144 mmol/L   Potassium 3.9 3.5 - 5.2 mmol/L   Chloride 107 (H) 96 - 106 mmol/L   CO2 22 20 - 29 mmol/L   Calcium  9.9 8.7 - 10.2 mg/dL   Total Protein 6.5 6.0 - 8.5 g/dL   Albumin 4.1 3.8 - 4.9 g/dL   Globulin, Total 2.4 1.5 - 4.5 g/dL   Bilirubin Total 0.4 0.0 - 1.2 mg/dL   Alkaline Phosphatase 74 44 - 121 IU/L   AST 22 0 - 40 IU/L   ALT 17 0 - 44 IU/L      Assessment & Plan:   Problem List Items Addressed This Visit     Chronic systolic heart failure (HCC)   Heart failure management is effective. No edema, chest pain, or dyspnea. He tolerates spironolactone  and Entresto  well. Also taking potassium. Will monitor levels carefully. Recommend alternating visits here with cardiology to ensure that we can closely keep labs monitored every 6 months - Refill all necessary medications.      Relevant Medications   spironolactone  (ALDACTONE ) 25 MG tablet   sacubitril -valsartan  (ENTRESTO ) 97-103 MG   tirzepatide  (MOUNJARO ) 15 MG/0.5ML Pen   amiodarone  (PACERONE ) 200 MG tablet   apixaban  (ELIQUIS ) 5 MG TABS tablet   dapagliflozin  propanediol (FARXIGA ) 10 MG TABS tablet   rosuvastatin  (CRESTOR ) 20 MG tablet   Other Relevant Orders   Hemoglobin A1c   Lipid  panel   Microalbumin/Creatinine Ratio, Urine   VITAMIN D  25 Hydroxy (Vit-D Deficiency, Fractures)   CBC with Differential/Platelet   Comprehensive metabolic panel with GFR   HM Diabetes Foot Exam (Completed)   OSA (obstructive sleep apnea)   CPAP use. No alarm sx present at this time.       Relevant Medications   Vitamin D , Ergocalciferol , (DRISDOL ) 1.25 MG (50000 UNIT) CAPS capsule   Other Relevant Orders   Hemoglobin A1c   Lipid panel   Microalbumin/Creatinine Ratio, Urine   VITAMIN D  25 Hydroxy (Vit-D Deficiency, Fractures)   CBC with Differential/Platelet   Comprehensive metabolic panel with GFR   HM Diabetes Foot Exam (Completed)   NICM (nonischemic cardiomyopathy) (HCC)   Chronic. No alarm symptoms are present at this time. Recommend close monitoring and increase in physical activity slowly and steadily to reduce risks of exacerbation.       Relevant Medications   spironolactone  (ALDACTONE ) 25 MG tablet   sacubitril -valsartan  (ENTRESTO ) 97-103 MG   tirzepatide  (MOUNJARO ) 15 MG/0.5ML Pen   amiodarone  (PACERONE ) 200 MG tablet   apixaban  (ELIQUIS ) 5 MG TABS tablet   dapagliflozin  propanediol (FARXIGA ) 10 MG TABS tablet   rosuvastatin  (CRESTOR ) 20 MG tablet   Other Relevant Orders   Hemoglobin A1c   Lipid panel   Microalbumin/Creatinine Ratio, Urine   VITAMIN D  25 Hydroxy (Vit-D Deficiency, Fractures)   CBC with Differential/Platelet   Comprehensive metabolic panel with GFR  HM Diabetes Foot Exam (Completed)   Hyperlipidemia associated with type 2 diabetes mellitus (HCC)   Chronic. Currently on statin therapy and mounjaro  for blood sugar and cardiovascular control. No symptoms present. Labs pending.       Relevant Medications   spironolactone  (ALDACTONE ) 25 MG tablet   sacubitril -valsartan  (ENTRESTO ) 97-103 MG   tirzepatide  (MOUNJARO ) 15 MG/0.5ML Pen   amiodarone  (PACERONE ) 200 MG tablet   apixaban  (ELIQUIS ) 5 MG TABS tablet   dapagliflozin  propanediol (FARXIGA )  10 MG TABS tablet   rosuvastatin  (CRESTOR ) 20 MG tablet   Other Relevant Orders   Hemoglobin A1c   Lipid panel   Microalbumin/Creatinine Ratio, Urine   VITAMIN D  25 Hydroxy (Vit-D Deficiency, Fractures)   CBC with Differential/Platelet   Comprehensive metabolic panel with GFR   HM Diabetes Foot Exam (Completed)   Vitamin D  deficiency   Repeat labs today and refill provided.       Relevant Medications   tirzepatide  (MOUNJARO ) 15 MG/0.5ML Pen   Other Relevant Orders   Hemoglobin A1c   Lipid panel   Microalbumin/Creatinine Ratio, Urine   VITAMIN D  25 Hydroxy (Vit-D Deficiency, Fractures)   CBC with Differential/Platelet   Comprehensive metabolic panel with GFR   HM Diabetes Foot Exam (Completed)   Encounter for annual physical exam   CPE completed today. Review of HM activities and recommendations discussed and provided on AVS. Anticipatory guidance, diet, and exercise recommendations provided. Medications, allergies, and hx reviewed and updated as necessary. Orders placed as listed below.  Plan: - Labs ordered. Will make changes as necessary based on results.  - I will review these results and send recommendations via MyChart or a telephone call.  - F/U with CPE in 1 year or sooner for acute/chronic health needs as directed.  - Pneumonia vaccine provided today - Recommend shingles vaccine at the pharmacy at convenience.        Relevant Medications   tirzepatide  (MOUNJARO ) 15 MG/0.5ML Pen   Other Relevant Orders   Hemoglobin A1c   Lipid panel   Microalbumin/Creatinine Ratio, Urine   VITAMIN D  25 Hydroxy (Vit-D Deficiency, Fractures)   CBC with Differential/Platelet   Comprehensive metabolic panel with GFR   HM Diabetes Foot Exam (Completed)   Hypertension associated with type 2 diabetes mellitus (HCC)   Blood pressure is well-controlled. No changes to management. Refills provided.       Relevant Medications   spironolactone  (ALDACTONE ) 25 MG tablet    sacubitril -valsartan  (ENTRESTO ) 97-103 MG   tirzepatide  (MOUNJARO ) 15 MG/0.5ML Pen   amiodarone  (PACERONE ) 200 MG tablet   apixaban  (ELIQUIS ) 5 MG TABS tablet   dapagliflozin  propanediol (FARXIGA ) 10 MG TABS tablet   rosuvastatin  (CRESTOR ) 20 MG tablet   Other Relevant Orders   Hemoglobin A1c   Lipid panel   Microalbumin/Creatinine Ratio, Urine   VITAMIN D  25 Hydroxy (Vit-D Deficiency, Fractures)   CBC with Differential/Platelet   Comprehensive metabolic panel with GFR   HM Diabetes Foot Exam (Completed)   Class 3 severe obesity with serious comorbidity and body mass index (BMI) of 40.0 to 44.9 in adult [facility])   Morbid obesity with multiple co-morbidities present. He is doing very well on maintaining control of his current conditions at this time. I do recommend slow increase in physical activity to help with weight management to reduce risks on health.       Relevant Medications   tirzepatide  (MOUNJARO ) 15 MG/0.5ML Pen   dapagliflozin  propanediol (FARXIGA ) 10 MG TABS tablet   Other Relevant Orders  Hemoglobin A1c   Lipid panel   Microalbumin/Creatinine Ratio, Urine   VITAMIN D  25 Hydroxy (Vit-D Deficiency, Fractures)   CBC with Differential/Platelet   Comprehensive metabolic panel with GFR   HM Diabetes Foot Exam (Completed)   ICD (implantable cardioverter-defibrillator) in place   Defibrillator functioning without issues. Cardiology follow-up is due, date uncertain.      Type 2 diabetes mellitus with hyperglycemia, without long-term current use of insulin  (HCC)   Blood glucose levels are well-controlled. He tolerates Mounjaro  well. No numbness, tingling, wounds. Will monitor labs today.       Relevant Medications   tirzepatide  (MOUNJARO ) 15 MG/0.5ML Pen   dapagliflozin  propanediol (FARXIGA ) 10 MG TABS tablet   rosuvastatin  (CRESTOR ) 20 MG tablet   Other Relevant Orders   Hemoglobin A1c   Lipid panel   Microalbumin/Creatinine Ratio, Urine   VITAMIN D  25 Hydroxy (Vit-D  Deficiency, Fractures)   CBC with Differential/Platelet   Comprehensive metabolic panel with GFR   HM Diabetes Foot Exam (Completed)   Atrial fibrillation (HCC)   Chronic afib, currently on anticoagulation. HR controlled today. No alarm symptoms present. Plan to continue eliquis  and monitor closely. He is not having any excessive bruising or signs of bleeding.  Plan: - Labs today - Continue eliquis  - notify immediately if bleeding or excessive bruising starts        Relevant Medications   spironolactone  (ALDACTONE ) 25 MG tablet   sacubitril -valsartan  (ENTRESTO ) 97-103 MG   tirzepatide  (MOUNJARO ) 15 MG/0.5ML Pen   amiodarone  (PACERONE ) 200 MG tablet   apixaban  (ELIQUIS ) 5 MG TABS tablet   dapagliflozin  propanediol (FARXIGA ) 10 MG TABS tablet   rosuvastatin  (CRESTOR ) 20 MG tablet   Other Relevant Orders   Hemoglobin A1c   Lipid panel   Microalbumin/Creatinine Ratio, Urine   VITAMIN D  25 Hydroxy (Vit-D Deficiency, Fractures)   CBC with Differential/Platelet   Comprehensive metabolic panel with GFR   HM Diabetes Foot Exam (Completed)   Acquired thrombophilia (HCC)   Labs monitored today. No alarm symptoms at this time.       Relevant Orders   Hemoglobin A1c   Lipid panel   Microalbumin/Creatinine Ratio, Urine   VITAMIN D  25 Hydroxy (Vit-D Deficiency, Fractures)   CBC with Differential/Platelet   Comprehensive metabolic panel with GFR   HM Diabetes Foot Exam (Completed)   Other Visit Diagnoses       Need for vaccination against Streptococcus pneumoniae    -  Primary   Relevant Orders   Pneumococcal conjugate vaccine 20-valent (Prevnar 20) (Completed)     Hypokalemia       Relevant Medications   potassium chloride  SA (KLOR-CON  M) 20 MEQ tablet     Screening for prostate cancer       Relevant Orders   PSA Total (Reflex To Free)        Follow up plan: NEXT PREVENTATIVE PHYSICAL DUE IN 1 YEAR. Return in about 1 year (around 08/29/2024) for CPE.  LABORATORY TESTING:   Health maintenance labs ordered today, if applicable.    PATIENT COUNSELING:   For all adult patients, I recommend A well balanced diet low in saturated fats, cholesterol, and moderation in carbohydrates.  This can be as simple as monitoring portion sizes and cutting back on sugary beverages such as soda and juice to start with.    Daily water consumption of at least 64 ounces.  Physical activity at least 180 minutes per week, if just starting out.  This can be as simple as taking  the stairs instead of the elevator and walking 2-3 laps around the office  purposefully every day.   STD protection, partner selection, and regular testing if high risk.  Limited consumption of alcoholic beverages if alcohol is consumed. For men, I recommend no more than 14 alcoholic beverages per week, spread out throughout the week (max 2 per day). Avoid "binge" drinking or consuming large quantities of alcohol in one setting.  Please let me know if you feel you may need help with reduction or quitting alcohol consumption.   Avoidance of nicotine, if used. Please let me know if you feel you may need help with reduction or quitting nicotine use.   Daily mental health attention. This can be in the form of 5 minute daily meditation, prayer, journaling, yoga, reflection, etc.  Purposeful attention to your emotions and mental state can significantly improve your overall wellbeing and Health.  Please know that I am here to help you with all of your health care goals and am happy to work with you to find a solution that works best for you.  The greatest advice I have received with any changes in life are to take it one step at a time, that even means if all you can focus on is the next 60 seconds, then do that and celebrate your victories.  With any changes in life, you will have set backs, and that is OK. The important thing to remember is, if you have a set back, it is not a failure, it is an opportunity to try  again!  Health Maintenance Recommendations Screening Testing Mammogram Every 1 -2 years based on history and risk factors Starting at age 7 Pap Smear Ages 21-39 every 3 years Ages 36-65 every 5 years with HPV testing More frequent testing may be required based on results and history Colon Cancer Screening Every 1-10 years based on test performed, risk factors, and history Starting at age 67 Bone Density Screening Every 2-10 years based on history Starting at age 50 for women Recommendations for men differ based on medication usage, history, and risk factors AAA Screening One time ultrasound Men 47-16 years old who have every smoked Lung Cancer Screening Low Dose Lung CT every 12 months Age 67-80 years with a 30 pack-year smoking history who still smoke or who have quit within the last 15 years   Screening Labs Routine  Labs: Complete Blood Count (CBC), Complete Metabolic Panel (CMP), Cholesterol (Lipid Panel) Every 6-12 months based on history and medications May be recommended more frequently based on current conditions or previous results Hemoglobin A1c Lab Every 3-12 months based on history and previous results Starting at age 69 or earlier with diagnosis of diabetes, high cholesterol, BMI >26, and/or risk factors Frequent monitoring for patients with diabetes to ensure blood sugar control Thyroid  Panel (TSH) Every 6 months based on history, symptoms, and risk factors May be repeated more often if on medication HIV One time testing for all patients 67 and older May be repeated more frequently for patients with increased risk factors or exposure Hepatitis C One time testing for all patients 8 and older May be repeated more frequently for patients with increased risk factors or exposure Gonorrhea, Chlamydia Every 12 months for all sexually active persons 13-24 years Additional monitoring may be recommended for those who are considered high risk or who have  symptoms Every 12 months for any woman on birth control, regardless of sexual activity PSA Men 52-12 years old with risk factors  Additional screening may be recommended from age 62-69 based on risk factors, symptoms, and history  Vaccine Recommendations Tetanus Booster All adults every 10 years Flu Vaccine All patients 6 months and older every year COVID Vaccine All patients 12 years and older Initial dosing with booster May recommend additional booster based on age and health history HPV Vaccine 2 doses all patients age 7-26 Dosing may be considered for patients over 26 Shingles Vaccine (Shingrix) 2 doses all adults 55 years and older Pneumonia (Pneumovax 49) All adults 65 years and older May recommend earlier dosing based on health history One year apart from Prevnar 13 Pneumonia (Prevnar 48) All adults 65 years and older Dosed 1 year after Pneumovax 23 Pneumonia (Prevnar 20) One time alternative to the two dosing of 13 and 23 For all adults with initial dose of 23, 20 is recommended 1 year later For all adults with initial dose of 13, 23 is still recommended as second option 1 year later  Additional Screening, Testing, and Vaccinations may be recommended on an individualized basis based on family history, health history, risk factors, and/or exposure.

## 2023-08-30 NOTE — Assessment & Plan Note (Signed)
Chronic. No alarm symptoms are present at this time. Recommend close monitoring and increase in physical activity slowly and steadily to reduce risks of exacerbation.

## 2023-08-30 NOTE — Assessment & Plan Note (Signed)
CPAP use. No alarm sx present at this time.  ?

## 2023-08-30 NOTE — Patient Instructions (Addendum)
 Everything looks good today!  I recommend walking at least 20 minutes every day at a pace that you can still carry on a conversation, but feel your body working.   We will plan to stagger our appointments with cardiology so we can just see you once a year, unless you need to see me sooner.   Medications: I have sent refills of the following medications for one year: Meds ordered this encounter  Medications   spironolactone  (ALDACTONE ) 25 MG tablet    Sig: Take 1 tablet (25 mg total) by mouth daily.    Dispense:  90 tablet    Refill:  3    Please cancel all previous orders for current medication.   Vitamin D , Ergocalciferol , (DRISDOL ) 1.25 MG (50000 UNIT) CAPS capsule    Sig: Take 1 capsule (50,000 Units total) by mouth every 7 (seven) days.    Dispense:  12 capsule    Refill:  3   sacubitril -valsartan  (ENTRESTO ) 97-103 MG    Sig: Take 1 tablet by mouth 2 (two) times daily.    Dispense:  180 tablet    Refill:  3    Please cancel all previous orders for current medication.   tirzepatide  (MOUNJARO ) 15 MG/0.5ML Pen    Sig: Inject 15 mg into the skin once a week.    Dispense:  6 mL    Refill:  3   amiodarone  (PACERONE ) 200 MG tablet    Sig: Take 1 tablet (200 mg total) by mouth daily.    Dispense:  90 tablet    Refill:  3    Please cancel all previous orders for current medication.   apixaban  (ELIQUIS ) 5 MG TABS tablet    Sig: Take 1 tablet (5 mg total) by mouth 2 (two) times daily.    Dispense:  180 tablet    Refill:  3    Please cancel all previous orders for current medication.   dapagliflozin  propanediol (FARXIGA ) 10 MG TABS tablet    Sig: Take 1 tablet (10 mg total) by mouth daily.    Dispense:  90 tablet    Refill:  3    Please cancel all previous orders for current medication.   potassium chloride  SA (KLOR-CON  M) 20 MEQ tablet    Sig: Take 2 tablets (40 mEq total) by mouth daily.    Dispense:  180 tablet    Refill:  3    Please cancel all previous orders for current  medication.   rosuvastatin  (CRESTOR ) 20 MG tablet    Sig: Take 2 tabs (40mg ) by mouth Monday, Wednesday, and Friday. Take 1 tab (20mg ) by mouth Sunday, Tuesday, Thursday, and Saturday. For cholesterol.    Dispense:  180 tablet    Refill:  3    Please cancel all previous orders for current medication.    Please continue to follow-up with cardiology as recommended for management.   Health Maintenance: I have that you are due for the following health maintenance: Pneumonia vaccine- we have given this to you today. You may take tylenol  to help with any arm pain  Shingles vaccine (2 doses)- this may make you feel poorly the day after you have the shot so I do recommend having this completed on a day that you do not have to work (or have big plans) the following day. For this reason, most people have these completed at the pharmacy.

## 2023-08-30 NOTE — Assessment & Plan Note (Signed)
 Heart failure management is effective. No edema, chest pain, or dyspnea. He tolerates spironolactone  and Entresto  well. Also taking potassium. Will monitor levels carefully. Recommend alternating visits here with cardiology to ensure that we can closely keep labs monitored every 6 months - Refill all necessary medications.

## 2023-08-30 NOTE — Assessment & Plan Note (Signed)
 CPE completed today. Review of HM activities and recommendations discussed and provided on AVS. Anticipatory guidance, diet, and exercise recommendations provided. Medications, allergies, and hx reviewed and updated as necessary. Orders placed as listed below.  Plan: - Labs ordered. Will make changes as necessary based on results.  - I will review these results and send recommendations via MyChart or a telephone call.  - F/U with CPE in 1 year or sooner for acute/chronic health needs as directed.  - Pneumonia vaccine provided today - Recommend shingles vaccine at the pharmacy at convenience.

## 2023-08-30 NOTE — Assessment & Plan Note (Signed)
Morbid obesity with multiple co-morbidities present. He is doing very well on maintaining control of his current conditions at this time. I do recommend slow increase in physical activity to help with weight management to reduce risks on health.

## 2023-08-31 LAB — CBC WITH DIFFERENTIAL/PLATELET
Basophils Absolute: 0 10*3/uL (ref 0.0–0.2)
Basos: 1 %
EOS (ABSOLUTE): 0.1 10*3/uL (ref 0.0–0.4)
Eos: 2 %
Hematocrit: 46 % (ref 37.5–51.0)
Hemoglobin: 15.6 g/dL (ref 13.0–17.7)
Immature Grans (Abs): 0 10*3/uL (ref 0.0–0.1)
Immature Granulocytes: 0 %
Lymphocytes Absolute: 1.6 10*3/uL (ref 0.7–3.1)
Lymphs: 20 %
MCH: 30.3 pg (ref 26.6–33.0)
MCHC: 33.9 g/dL (ref 31.5–35.7)
MCV: 89 fL (ref 79–97)
Monocytes Absolute: 0.6 10*3/uL (ref 0.1–0.9)
Monocytes: 7 %
Neutrophils Absolute: 5.6 10*3/uL (ref 1.4–7.0)
Neutrophils: 70 %
Platelets: 238 10*3/uL (ref 150–450)
RBC: 5.15 x10E6/uL (ref 4.14–5.80)
RDW: 13.6 % (ref 11.6–15.4)
WBC: 8 10*3/uL (ref 3.4–10.8)

## 2023-08-31 LAB — MICROALBUMIN / CREATININE URINE RATIO
Creatinine, Urine: 162 mg/dL
Microalb/Creat Ratio: 11 mg/g{creat} (ref 0–29)
Microalbumin, Urine: 18 ug/mL

## 2023-08-31 LAB — COMPREHENSIVE METABOLIC PANEL WITH GFR
ALT: 25 IU/L (ref 0–44)
AST: 21 IU/L (ref 0–40)
Albumin: 4.2 g/dL (ref 3.8–4.9)
Alkaline Phosphatase: 77 IU/L (ref 44–121)
BUN/Creatinine Ratio: 11 (ref 9–20)
BUN: 16 mg/dL (ref 6–24)
Bilirubin Total: 0.5 mg/dL (ref 0.0–1.2)
CO2: 22 mmol/L (ref 20–29)
Calcium: 9.2 mg/dL (ref 8.7–10.2)
Chloride: 103 mmol/L (ref 96–106)
Creatinine, Ser: 1.47 mg/dL — ABNORMAL HIGH (ref 0.76–1.27)
Globulin, Total: 2.7 g/dL (ref 1.5–4.5)
Glucose: 118 mg/dL — ABNORMAL HIGH (ref 70–99)
Potassium: 3.5 mmol/L (ref 3.5–5.2)
Sodium: 142 mmol/L (ref 134–144)
Total Protein: 6.9 g/dL (ref 6.0–8.5)
eGFR: 55 mL/min/{1.73_m2} — ABNORMAL LOW (ref >59)

## 2023-08-31 LAB — LIPID PANEL
Cholesterol, Total: 128 mg/dL (ref 100–199)
HDL: 27 mg/dL — ABNORMAL LOW (ref >39)
LDL CALC COMMENT:: 4.7 ratio (ref 0.0–5.0)
LDL Chol Calc (NIH): 78 mg/dL (ref 0–99)
Triglycerides: 124 mg/dL (ref 0–149)
VLDL Cholesterol Cal: 23 mg/dL (ref 5–40)

## 2023-08-31 LAB — PSA TOTAL (REFLEX TO FREE): Prostate Specific Ag, Serum: 0.9 ng/mL (ref 0.0–4.0)

## 2023-08-31 LAB — HEMOGLOBIN A1C
Est. average glucose Bld gHb Est-mCnc: 117 mg/dL
Hgb A1c MFr Bld: 5.7 % — ABNORMAL HIGH (ref 4.8–5.6)

## 2023-08-31 LAB — VITAMIN D 25 HYDROXY (VIT D DEFICIENCY, FRACTURES): Vit D, 25-Hydroxy: 35.9 ng/mL (ref 30.0–100.0)

## 2023-09-01 ENCOUNTER — Ambulatory Visit: Payer: BC Managed Care – PPO | Attending: Cardiology

## 2023-09-01 DIAGNOSIS — I428 Other cardiomyopathies: Secondary | ICD-10-CM | POA: Diagnosis not present

## 2023-09-01 LAB — CUP PACEART REMOTE DEVICE CHECK
Battery Remaining Longevity: 66 mo
Battery Remaining Percentage: 72 %
Battery Voltage: 2.96 V
Brady Statistic AP VP Percent: 2.4 %
Brady Statistic AP VS Percent: 1 %
Brady Statistic AS VP Percent: 97 %
Brady Statistic AS VS Percent: 1 %
Brady Statistic RA Percent Paced: 2.3 %
Date Time Interrogation Session: 20250501020753
HighPow Impedance: 72 Ohm
Implantable Lead Connection Status: 753985
Implantable Lead Connection Status: 753985
Implantable Lead Connection Status: 753985
Implantable Lead Implant Date: 20150421
Implantable Lead Implant Date: 20150421
Implantable Lead Implant Date: 20150421
Implantable Lead Location: 753857
Implantable Lead Location: 753859
Implantable Lead Location: 753860
Implantable Pulse Generator Implant Date: 20230421
Lead Channel Impedance Value: 360 Ohm
Lead Channel Impedance Value: 440 Ohm
Lead Channel Impedance Value: 710 Ohm
Lead Channel Pacing Threshold Amplitude: 0.75 V
Lead Channel Pacing Threshold Amplitude: 1 V
Lead Channel Pacing Threshold Amplitude: 1 V
Lead Channel Pacing Threshold Pulse Width: 0.5 ms
Lead Channel Pacing Threshold Pulse Width: 0.5 ms
Lead Channel Pacing Threshold Pulse Width: 0.5 ms
Lead Channel Sensing Intrinsic Amplitude: 12 mV
Lead Channel Sensing Intrinsic Amplitude: 2.8 mV
Lead Channel Setting Pacing Amplitude: 2 V
Lead Channel Setting Pacing Amplitude: 2 V
Lead Channel Setting Pacing Amplitude: 2 V
Lead Channel Setting Pacing Pulse Width: 0.5 ms
Lead Channel Setting Pacing Pulse Width: 0.5 ms
Lead Channel Setting Sensing Sensitivity: 0.5 mV
Pulse Gen Serial Number: 210000231

## 2023-09-02 ENCOUNTER — Encounter: Payer: Self-pay | Admitting: Cardiology

## 2023-09-08 ENCOUNTER — Encounter: Payer: Self-pay | Admitting: Nurse Practitioner

## 2023-09-08 NOTE — Addendum Note (Signed)
 Addended by: Pacey Willadsen, Abraham Hoffmann E on: 09/08/2023 06:53 PM   Modules accepted: Orders

## 2023-09-19 ENCOUNTER — Ambulatory Visit: Attending: Cardiology

## 2023-09-19 DIAGNOSIS — Z95810 Presence of automatic (implantable) cardiac defibrillator: Secondary | ICD-10-CM | POA: Diagnosis not present

## 2023-09-19 DIAGNOSIS — I5022 Chronic systolic (congestive) heart failure: Secondary | ICD-10-CM

## 2023-09-21 NOTE — Progress Notes (Signed)
 EPIC Encounter for ICM Monitoring  Patient Name: Gary Williamson is a 58 y.o. male Date: 09/21/2023 Primary Care Physican: Gary Kief, NP Primary Cardiologist: Mitzie Anda Electrophysiologist: Kasandra Pain Pacing: >99%     04/05/2022 Office Weight: 342 lbs 02/28/2023 Office Weight: 335 lbs 05/09/2023 Weight: 335 lbs 06/09/2023 Office Weight: 345 lbs 08/30/2023 Office Weight: 333 lbs   AT/AF Burden: 0% (taking Eliquis )    Transmission results reviewed.    Diet:  05/09/2023 he reports he may be drinking more fluid on some days and greater than recommended amount of 64 oz daily.   Corvue Thoracic impedance suggesting normal fluid levels with the exception of possible fluid accumulation from 5/3-5/15.     Prescribed: Torsemide  20 mg take 2 tablets (40 mg total) daily.   Potassium 20 mEq take 2 tablet(s) (40 mEq total) by mouth daily Spironolactone  25 mg take 1 tablet(s) (25 mEq total) by mouth daily   Labs: 08/30/2023 Creatinine 1.47, BUN 16, Potassium 3.5, Sodium 142, GFR 55  06/08/2023 Creatinine 1.24, BUN 11, Potassium 3.9, Sodium 145, GFR 67  A complete set of results can be found in Results Review.   Recommendations:  No changes.   Follow-up plan: ICM clinic phone appointment on 11/21/2023.   91 day device clinic remote transmission 12/01/2023.     EP/Cardiology Office Visits:     Recall 10/27/2023 with Dr Mitzie Anda.   Recall 12/05/2023 with Dr Marven Slimmer or APP.   Copy of ICM check sent to Dr. Marven Slimmer.   3 month ICM trend: 09/19/2023.    12-14 Month ICM trend:     Almyra Jain, RN 09/21/2023 8:01 AM

## 2023-09-23 ENCOUNTER — Other Ambulatory Visit

## 2023-09-23 DIAGNOSIS — R7989 Other specified abnormal findings of blood chemistry: Secondary | ICD-10-CM

## 2023-09-24 LAB — COMPREHENSIVE METABOLIC PANEL WITH GFR
ALT: 18 IU/L (ref 0–44)
AST: 21 IU/L (ref 0–40)
Albumin: 4.3 g/dL (ref 3.8–4.9)
Alkaline Phosphatase: 76 IU/L (ref 44–121)
BUN/Creatinine Ratio: 9 (ref 9–20)
BUN: 13 mg/dL (ref 6–24)
Bilirubin Total: 0.5 mg/dL (ref 0.0–1.2)
CO2: 19 mmol/L — ABNORMAL LOW (ref 20–29)
Calcium: 9 mg/dL (ref 8.7–10.2)
Chloride: 103 mmol/L (ref 96–106)
Creatinine, Ser: 1.38 mg/dL — ABNORMAL HIGH (ref 0.76–1.27)
Globulin, Total: 2.7 g/dL (ref 1.5–4.5)
Glucose: 100 mg/dL — ABNORMAL HIGH (ref 70–99)
Potassium: 3.7 mmol/L (ref 3.5–5.2)
Sodium: 144 mmol/L (ref 134–144)
Total Protein: 7 g/dL (ref 6.0–8.5)
eGFR: 59 mL/min/{1.73_m2} — ABNORMAL LOW (ref >59)

## 2023-09-28 ENCOUNTER — Ambulatory Visit: Payer: Self-pay | Admitting: Nurse Practitioner

## 2023-10-13 NOTE — Progress Notes (Signed)
 Remote ICD transmission.

## 2023-11-21 ENCOUNTER — Ambulatory Visit: Attending: Cardiology

## 2023-11-21 DIAGNOSIS — Z95810 Presence of automatic (implantable) cardiac defibrillator: Secondary | ICD-10-CM

## 2023-11-21 DIAGNOSIS — I5022 Chronic systolic (congestive) heart failure: Secondary | ICD-10-CM | POA: Diagnosis not present

## 2023-11-23 NOTE — Progress Notes (Signed)
 EPIC Encounter for ICM Monitoring  Patient Name: Gary Williamson is a 58 y.o. male Date: 11/23/2023 Primary Care Physican: Oris Camie BRAVO, NP Primary Cardiologist: Rolan Electrophysiologist: Cindie Pore Pacing: >99%     04/05/2022 Office Weight: 342 lbs 02/28/2023 Office Weight: 335 lbs 05/09/2023 Weight: 335 lbs 06/09/2023 Office Weight: 345 lbs 08/30/2023 Office Weight: 333 lbs   AT/AF Burden: 0% (taking Eliquis )    Transmission results reviewed.    Diet:  05/09/2023 he reports he may be drinking more fluid on some days and greater than recommended amount of 64 oz daily.   Corvue Thoracic impedance suggesting pattern of possible fluid accumulation 4-6 days followed by dryness for 3-4 days within the last month.     Prescribed: Torsemide  20 mg take 2 tablets (40 mg total) daily.   Potassium 20 mEq take 2 tablet(s) (40 mEq total) by mouth daily Spironolactone  25 mg take 1 tablet(s) (25 mEq total) by mouth daily   Labs: 09/23/2023 Creatinine 1.38, BUN 13, Potassium 3.7, Sodium 144, GFR 59 08/30/2023 Creatinine 1.47, BUN 16, Potassium 3.5, Sodium 142, GFR 55  06/08/2023 Creatinine 1.24, BUN 11, Potassium 3.9, Sodium 145, GFR 67  A complete set of results can be found in Results Review.   Recommendations:  No changes.   Follow-up plan: ICM clinic phone appointment on 12/26/2023.   91 day device clinic remote transmission 03/01/2024.     EP/Cardiology Office Visits:     Recall 10/27/2023 with Dr Rolan.   Recall 12/05/2023 with Dr Cindie or APP.   Copy of ICM check sent to Dr. Cindie.   3 month ICM trend: 11/21/2023.    12-14 Month ICM trend:     [name], RN 11/23/2023 5:12 PM

## 2023-12-01 ENCOUNTER — Ambulatory Visit: Payer: BC Managed Care – PPO

## 2023-12-01 DIAGNOSIS — I428 Other cardiomyopathies: Secondary | ICD-10-CM | POA: Diagnosis not present

## 2023-12-01 LAB — CUP PACEART REMOTE DEVICE CHECK
Battery Remaining Longevity: 61 mo
Battery Remaining Percentage: 69 %
Battery Voltage: 2.96 V
Brady Statistic AP VP Percent: 2 %
Brady Statistic AP VS Percent: 1 %
Brady Statistic AS VP Percent: 98 %
Brady Statistic AS VS Percent: 1 %
Brady Statistic RA Percent Paced: 2 %
Date Time Interrogation Session: 20250731020620
HighPow Impedance: 71 Ohm
Implantable Lead Connection Status: 753985
Implantable Lead Connection Status: 753985
Implantable Lead Connection Status: 753985
Implantable Lead Implant Date: 20150421
Implantable Lead Implant Date: 20150421
Implantable Lead Implant Date: 20150421
Implantable Lead Location: 753857
Implantable Lead Location: 753859
Implantable Lead Location: 753860
Implantable Pulse Generator Implant Date: 20230421
Lead Channel Impedance Value: 340 Ohm
Lead Channel Impedance Value: 380 Ohm
Lead Channel Impedance Value: 760 Ohm
Lead Channel Pacing Threshold Amplitude: 0.75 V
Lead Channel Pacing Threshold Amplitude: 1 V
Lead Channel Pacing Threshold Amplitude: 1 V
Lead Channel Pacing Threshold Pulse Width: 0.5 ms
Lead Channel Pacing Threshold Pulse Width: 0.5 ms
Lead Channel Pacing Threshold Pulse Width: 0.5 ms
Lead Channel Sensing Intrinsic Amplitude: 12 mV
Lead Channel Sensing Intrinsic Amplitude: 2.7 mV
Lead Channel Setting Pacing Amplitude: 2 V
Lead Channel Setting Pacing Amplitude: 2 V
Lead Channel Setting Pacing Amplitude: 2 V
Lead Channel Setting Pacing Pulse Width: 0.5 ms
Lead Channel Setting Pacing Pulse Width: 0.5 ms
Lead Channel Setting Sensing Sensitivity: 0.5 mV
Pulse Gen Serial Number: 210000231

## 2023-12-05 ENCOUNTER — Ambulatory Visit: Payer: Self-pay | Admitting: Cardiology

## 2023-12-26 ENCOUNTER — Ambulatory Visit: Attending: Cardiology

## 2023-12-26 DIAGNOSIS — Z95810 Presence of automatic (implantable) cardiac defibrillator: Secondary | ICD-10-CM | POA: Diagnosis not present

## 2023-12-26 DIAGNOSIS — I5022 Chronic systolic (congestive) heart failure: Secondary | ICD-10-CM | POA: Diagnosis not present

## 2023-12-28 ENCOUNTER — Telehealth: Payer: Self-pay

## 2023-12-28 NOTE — Progress Notes (Signed)
 EPIC Encounter for ICM Monitoring  Patient Name: Gary Williamson is a 58 y.o. male Date: 12/28/2023 Primary Care Physican: Gary Camie BRAVO, NP Primary Cardiologist: Gary Williamson Electrophysiologist: Gary Williamson Pacing: >99%     04/05/2022 Office Weight: 342 lbs 02/28/2023 Office Weight: 335 lbs 05/09/2023 Weight: 335 lbs 06/09/2023 Office Weight: 345 lbs 08/30/2023 Office Weight: 333 lbs   AT/AF Burden: 0% (taking Eliquis )    Attempted call to patient and unable to reach.  Left detailed message per DPR regarding transmission.  Transmission results reviewed.    Diet:  05/09/2023 he reports he may be drinking more fluid on some days and greater than recommended amount of 64 oz daily.   Corvue Thoracic impedance suggesting possible fluid accumulation starting 8/16.  Also suggesting possible fluid accumulation 7/9-7/18 and 7/31-8/10.  Pattern of days with possible fluid accumulation alternating with possible.      Prescribed: Torsemide  20 mg take 2 tablets (40 mg total) daily.   Potassium 20 mEq take 2 tablet(s) (40 mEq total) by mouth daily Spironolactone  25 mg take 1 tablet(s) (25 mEq total) by mouth daily   Labs: 09/23/2023 Creatinine 1.38, BUN 13, Potassium 3.7, Sodium 144, GFR 59 08/30/2023 Creatinine 1.47, BUN 16, Potassium 3.5, Sodium 142, GFR 55  06/08/2023 Creatinine 1.24, BUN 11, Potassium 3.9, Sodium 145, GFR 67  A complete set of results can be found in Results Review.   Recommendations:  Left voice mail with ICM number and encouraged to call if experiencing any fluid symptoms.  Will send to Dr Gary Williamson for review if patient is reached.    Follow-up plan: ICM clinic phone appointment on 01/03/2024 to recheck fluid levels.  91 day device clinic remote transmission 03/01/2024.     EP/Cardiology Office Visits:     Recall 10/27/2023 with Dr Gary Williamson.   Recall 12/05/2023 with Dr Gary or APP.   Copy of ICM check sent to Dr. Cindie.   3 month ICM trend: 12/26/2023.    12-14 Month ICM trend:      Gary GORMAN Garner, RN 12/28/2023 8:03 AM

## 2023-12-28 NOTE — Telephone Encounter (Signed)
 Remote ICM transmission received.  Attempted call to patient regarding ICM remote transmission and left detailed message per DPR.  Left ICM phone number and advised to return call for any fluid symptoms or questions. Next ICM remote transmission scheduled 01/03/2024.

## 2024-01-03 ENCOUNTER — Ambulatory Visit: Attending: Cardiology

## 2024-01-03 DIAGNOSIS — Z95810 Presence of automatic (implantable) cardiac defibrillator: Secondary | ICD-10-CM

## 2024-01-03 DIAGNOSIS — I5022 Chronic systolic (congestive) heart failure: Secondary | ICD-10-CM

## 2024-01-04 NOTE — Progress Notes (Signed)
 EPIC Encounter for ICM Monitoring  Patient Name: Gary Williamson is a 58 y.o. male Date: 01/04/2024 Primary Care Physican: Gary Camie BRAVO, NP Primary Cardiologist: Gary Williamson Electrophysiologist: Gary Williamson Pacing: >99%     04/05/2022 Office Weight: 342 lbs 02/28/2023 Office Weight: 335 lbs 05/09/2023 Weight: 335 lbs 06/09/2023 Office Weight: 345 lbs 08/30/2023 Office Weight: 333 lbs   AT/AF Burden: 0% (taking Eliquis )    Transmission results reviewed.    Diet:  No updates   Corvue Thoracic impedance suggesting possible fluid accumulation starting 8/16 and returned to normal 8/25.  Continues with pattern of days with possible fluid accumulation alternating with possible days of dryness.      Prescribed: Torsemide  20 mg take 2 tablets (40 mg total) daily.   Potassium 20 mEq take 2 tablet(s) (40 mEq total) by mouth daily Spironolactone  25 mg take 1 tablet(s) (25 mEq total) by mouth daily   Labs: 09/23/2023 Creatinine 1.38, BUN 13, Potassium 3.7, Sodium 144, GFR 59 08/30/2023 Creatinine 1.47, BUN 16, Potassium 3.5, Sodium 142, GFR 55  06/08/2023 Creatinine 1.24, BUN 11, Potassium 3.9, Sodium 145, GFR 67  A complete set of results can be found in Results Review.   Recommendations:  No changes.   Follow-up plan: ICM clinic phone appointment on 01/30/2024.  91 day device clinic remote transmission 03/01/2024.     EP/Cardiology Office Visits:     Recall 10/27/2023 with Dr Gary Williamson.   Recall 12/05/2023 with Dr Gary or APP.   Copy of ICM check sent to Dr. Cindie.   3 month ICM trend: 01/03/2024.    12-14 Month ICM trend:     Gary GORMAN Garner, RN 01/04/2024 12:38 PM

## 2024-01-30 ENCOUNTER — Ambulatory Visit: Attending: Cardiology

## 2024-01-30 DIAGNOSIS — I5022 Chronic systolic (congestive) heart failure: Secondary | ICD-10-CM

## 2024-01-30 DIAGNOSIS — Z95810 Presence of automatic (implantable) cardiac defibrillator: Secondary | ICD-10-CM | POA: Diagnosis not present

## 2024-02-01 NOTE — Progress Notes (Signed)
 Remote ICD Transmission

## 2024-02-01 NOTE — Progress Notes (Signed)
 EPIC Encounter for ICM Monitoring  Patient Name: Gary Williamson is a 58 y.o. male Date: 02/01/2024 Primary Care Physican: Oris Camie BRAVO, NP Primary Cardiologist: Rolan Electrophysiologist: Cindie Pore Pacing: >99%     04/05/2022 Office Weight: 342 lbs 02/28/2023 Office Weight: 335 lbs 05/09/2023 Weight: 335 lbs 06/09/2023 Office Weight: 345 lbs 08/30/2023 Office Weight: 333 lbs   AT/AF Burden: 0% (taking Eliquis )    Transmission results reviewed.    Diet:  No updates   Since 01/03/2024 ICM Remote Transmission:  Corvue Thoracic impedance suggesting pattern of days with possible fluid accumulation alternating with possible days of dryness.      Prescribed: Torsemide  20 mg take 2 tablets (40 mg total) daily.   Potassium 20 mEq take 2 tablet(s) (40 mEq total) by mouth daily Spironolactone  25 mg take 1 tablet(s) (25 mEq total) by mouth daily   Labs: 09/23/2023 Creatinine 1.38, BUN 13, Potassium 3.7, Sodium 144, GFR 59 08/30/2023 Creatinine 1.47, BUN 16, Potassium 3.5, Sodium 142, GFR 55  06/08/2023 Creatinine 1.24, BUN 11, Potassium 3.9, Sodium 145, GFR 67  A complete set of results can be found in Results Review.   Recommendations:  No changes.   Follow-up plan: ICM clinic phone appointment on 03/12/2024.  91 day device clinic remote transmission 03/01/2024.     EP/Cardiology Office Visits:     Recall 10/27/2023 with Dr Rolan.   Recall 12/05/2023 with Dr Cindie or APP.   Copy of ICM check sent to Dr. Cindie.   Remote monitoring is medically necessary for Heart Failure Management.    90 day Daily Thoracic Impedance ICM trend: 11/01/2023 through 01/30/2024.    12-14 Month Thoracic Impedance ICM trend:     Gary GORMAN Garner, RN 02/01/2024 4:55 PM

## 2024-02-21 ENCOUNTER — Other Ambulatory Visit (HOSPITAL_COMMUNITY): Payer: Self-pay

## 2024-02-21 ENCOUNTER — Telehealth: Payer: Self-pay

## 2024-02-21 NOTE — Telephone Encounter (Signed)
 Copied from CRM #[id number]. Topic: Clinical - Medication Prior Auth >> Feb 21, 2024 12:23 PM Gary Williamson wrote: Reason for CRM: Patient states pharmacy faxed a PA over for his medication- tirzepatide  (MOUNJARO ) 15 MG/0.5ML Pen. He is calling to see if its been received and can it be authorized.

## 2024-02-22 ENCOUNTER — Telehealth: Payer: Self-pay

## 2024-02-22 ENCOUNTER — Other Ambulatory Visit (HOSPITAL_COMMUNITY): Payer: Self-pay

## 2024-02-22 NOTE — Telephone Encounter (Signed)
 Request:     Pharmacy Patient Advocate Encounter  Insurance verification completed.   The patient is insured through [employer]   Ran test claim for Mounjaro  15. Currently a quantity of 2ml is a 28 day supply and the co-pay is 0.00 . No further P/A is needed as there is one currently on file until 1.9.26. I have contacted the pts pref'd pharmacy with instructions on how to fill the Rx. The Rx is now ready for pickup  This test claim was processed through [facility]- copay amounts may vary at other pharmacies due to pharmacy/plan contracts, or as the patient moves through the different stages of their insurance plan.

## 2024-03-01 ENCOUNTER — Ambulatory Visit: Payer: BC Managed Care – PPO

## 2024-03-01 DIAGNOSIS — I5022 Chronic systolic (congestive) heart failure: Secondary | ICD-10-CM

## 2024-03-01 LAB — CUP PACEART REMOTE DEVICE CHECK
Battery Remaining Longevity: 59 mo
Battery Remaining Percentage: 66 %
Battery Voltage: 2.96 V
Brady Statistic AP VP Percent: 1.8 %
Brady Statistic AP VS Percent: 1 %
Brady Statistic AS VP Percent: 98 %
Brady Statistic AS VS Percent: 1 %
Brady Statistic RA Percent Paced: 1.7 %
Date Time Interrogation Session: 20251030020320
HighPow Impedance: 63 Ohm
Implantable Lead Connection Status: 753985
Implantable Lead Connection Status: 753985
Implantable Lead Connection Status: 753985
Implantable Lead Implant Date: 20150421
Implantable Lead Implant Date: 20150421
Implantable Lead Implant Date: 20150421
Implantable Lead Location: 753857
Implantable Lead Location: 753859
Implantable Lead Location: 753860
Implantable Pulse Generator Implant Date: 20230421
Lead Channel Impedance Value: 310 Ohm
Lead Channel Impedance Value: 360 Ohm
Lead Channel Impedance Value: 760 Ohm
Lead Channel Pacing Threshold Amplitude: 0.75 V
Lead Channel Pacing Threshold Amplitude: 1 V
Lead Channel Pacing Threshold Amplitude: 1 V
Lead Channel Pacing Threshold Pulse Width: 0.5 ms
Lead Channel Pacing Threshold Pulse Width: 0.5 ms
Lead Channel Pacing Threshold Pulse Width: 0.5 ms
Lead Channel Sensing Intrinsic Amplitude: 12 mV
Lead Channel Sensing Intrinsic Amplitude: 2.6 mV
Lead Channel Setting Pacing Amplitude: 2 V
Lead Channel Setting Pacing Amplitude: 2 V
Lead Channel Setting Pacing Amplitude: 2 V
Lead Channel Setting Pacing Pulse Width: 0.5 ms
Lead Channel Setting Pacing Pulse Width: 0.5 ms
Lead Channel Setting Sensing Sensitivity: 0.5 mV
Pulse Gen Serial Number: 210000231

## 2024-03-02 ENCOUNTER — Ambulatory Visit: Payer: Self-pay | Admitting: Cardiology

## 2024-03-06 NOTE — Progress Notes (Signed)
 Remote ICD Transmission

## 2024-03-09 ENCOUNTER — Other Ambulatory Visit (HOSPITAL_COMMUNITY): Payer: Self-pay | Admitting: Family Medicine

## 2024-03-12 ENCOUNTER — Ambulatory Visit: Attending: Cardiology

## 2024-03-12 ENCOUNTER — Telehealth: Payer: Self-pay

## 2024-03-12 DIAGNOSIS — I5022 Chronic systolic (congestive) heart failure: Secondary | ICD-10-CM | POA: Diagnosis not present

## 2024-03-12 DIAGNOSIS — Z95810 Presence of automatic (implantable) cardiac defibrillator: Secondary | ICD-10-CM | POA: Diagnosis not present

## 2024-03-12 NOTE — Telephone Encounter (Signed)
 Remote ICM transmission received.  Attempted call to patient regarding ICM remote transmission.  Left detailed message per DPR with ICM phone number to return call for any questions, concerns or fluid symptoms.

## 2024-03-12 NOTE — Progress Notes (Signed)
 EPIC Encounter for ICM Monitoring  Patient Name: Gary Williamson is a 58 y.o. male Date: 03/12/2024 Primary Care Physican: Oris Camie BRAVO, NP Primary Cardiologist: Rolan Electrophysiologist: Cindie Pore Pacing: >99%     04/05/2022 Office Weight: 342 lbs 02/28/2023 Office Weight: 335 lbs 05/09/2023 Weight: 335 lbs 06/09/2023 Office Weight: 345 lbs 08/30/2023 Office Weight: 333 lbs   AT/AF Burden: 0% (taking Eliquis )    Attempted call to patient and unable to reach.  Left detailed message per DPR regarding transmission.  Transmission results reviewed.    Diet:  No updates   Since 01/30/2024 ICM Remote Transmission:  Corvue Thoracic impedance suggesting pattern of days with possible fluid accumulation alternating with possible days of dryness.   Most recent possible fluid accumulation starting 03/08/2024.   Prescribed: Torsemide  20 mg take 2 tablets (40 mg total) daily.   Potassium 20 mEq take 2 tablet(s) (40 mEq total) by mouth daily Spironolactone  25 mg take 1 tablet(s) (25 mEq total) by mouth daily   Labs: 09/23/2023 Creatinine 1.38, BUN 13, Potassium 3.7, Sodium 144, GFR 59 08/30/2023 Creatinine 1.47, BUN 16, Potassium 3.5, Sodium 142, GFR 55  06/08/2023 Creatinine 1.24, BUN 11, Potassium 3.9, Sodium 145, GFR 67  A complete set of results can be found in Results Review.   Recommendations:  Left voice mail with ICM number and encouraged to call if experiencing any fluid symptoms.  Will send to Dr Rolan for review if patient is reached.    Follow-up plan: ICM clinic phone appointment on 03/26/2024 to recheck fluid levels.  91 day device clinic remote transmission 05/31/2024.     EP/Cardiology Office Visits:     Recall 10/27/2023 with Dr Rolan.   Recall 12/05/2023 with Dr Cindie or APP.   Copy of ICM check sent to Dr. Cindie.   Remote monitoring is medically necessary for Heart Failure Management.    Daily Thoracic Impedance ICM trend: 12/13/2023 through 03/12/2024.    12-14 Month  Thoracic Impedance ICM trend:     [name], RN 03/12/2024 4:07 PM

## 2024-03-26 ENCOUNTER — Ambulatory Visit: Attending: Cardiology

## 2024-03-26 DIAGNOSIS — Z95810 Presence of automatic (implantable) cardiac defibrillator: Secondary | ICD-10-CM

## 2024-03-26 DIAGNOSIS — I5022 Chronic systolic (congestive) heart failure: Secondary | ICD-10-CM

## 2024-03-27 ENCOUNTER — Telehealth: Payer: Self-pay

## 2024-03-27 NOTE — Telephone Encounter (Signed)
 Remote ICM transmission received.  Attempted call to patient regarding ICM remote transmission.  Left detailed message per DPR with ICM phone number to return call for any questions, concerns or fluid symptoms.

## 2024-03-27 NOTE — Progress Notes (Signed)
 EPIC Encounter for ICM Monitoring  Patient Name: Gary Williamson is a 58 y.o. male Date: 03/27/2024 Primary Care Physican: Oris Camie BRAVO, NP Primary Cardiologist: Rolan Electrophysiologist: Inocencio Bi-V Pacing: >99%     04/05/2022 Office Weight: 342 lbs 02/28/2023 Office Weight: 335 lbs 05/09/2023 Weight: 335 lbs 06/09/2023 Office Weight: 345 lbs 08/30/2023 Office Weight: 333 lbs   AT/AF Burden: 0% (taking Eliquis )    Attempted call to patient and unable to reach.  Left detailed message per DPR regarding transmission.  Transmission results reviewed.    Diet:  No updates   Since 03/12/2024 ICM Remote Transmission:  Corvue Thoracic impedance suggesting pattern of days with possible fluid accumulation alternating with possible days of dryness.   Most recent possible fluid accumulation starting 03/23/2024.   Prescribed: Torsemide  20 mg take 2 tablets (40 mg total) daily.   Potassium 20 mEq take 2 tablet(s) (40 mEq total) by mouth daily Spironolactone  25 mg take 1 tablet(s) (25 mEq total) by mouth daily   Labs: 09/23/2023 Creatinine 1.38, BUN 13, Potassium 3.7, Sodium 144, GFR 59 08/30/2023 Creatinine 1.47, BUN 16, Potassium 3.5, Sodium 142, GFR 55  06/08/2023 Creatinine 1.24, BUN 11, Potassium 3.9, Sodium 145, GFR 67  A complete set of results can be found in Results Review.   Recommendations:  Left voice mail with ICM number and encouraged to call if experiencing any fluid symptoms.  Will send to Dr Rolan for review if patient is reached.    Follow-up plan: ICM clinic phone appointment on 04/30/2024.  91 day device clinic remote transmission 05/31/2024.     EP/Cardiology Office Visits:     Recall 10/27/2023 with Dr Rolan.   Recall 12/05/2023 with Dr Cindie or APP.   Copy of ICM check sent to Dr. Inocencio.   Remote monitoring is medically necessary for Heart Failure Management.    Daily Thoracic Impedance ICM trend: 12/27/2023 through 03/26/2024.    12-14 Month Thoracic Impedance  ICM trend:     [name], RN 03/27/2024 7:41 AM

## 2024-04-30 ENCOUNTER — Ambulatory Visit: Attending: Cardiology

## 2024-04-30 DIAGNOSIS — I5022 Chronic systolic (congestive) heart failure: Secondary | ICD-10-CM | POA: Diagnosis not present

## 2024-04-30 DIAGNOSIS — Z95810 Presence of automatic (implantable) cardiac defibrillator: Secondary | ICD-10-CM

## 2024-04-30 NOTE — Progress Notes (Unsigned)
" °  Received: Today Rolan Ezra RAMAN, MD  Shalaine Payson, Mitzie RAMAN, RN; [facility] Triage Pool Take 60 mg torsemide  x 3 days then back to his regular 40 mg daily.  BMET 10 days.  Needs followup with APP in our office in 2 wks. "

## 2024-04-30 NOTE — Progress Notes (Unsigned)
 EPIC Encounter for ICM Monitoring  Patient Name: Gary Williamson is a 58 y.o. male Date: 04/30/2024 Primary Care Physican: Oris Camie BRAVO, NP Primary Cardiologist: Rolan Electrophysiologist: Inocencio Bi-V Pacing: >99%     04/05/2022 Office Weight: 342 lbs 02/28/2023 Office Weight: 335 lbs 05/09/2023 Weight: 335 lbs 06/09/2023 Office Weight: 345 lbs 08/30/2023 Office Weight: 333 lbs 04/30/2024 Weight: 329 lbs   AT/AF Burden: 0% (taking Eliquis )    Spoke with patient and heart failure questions reviewed.  Transmission results reviewed.  Pt asymptomatic for fluid accumulation.  Missed 4-5 days of Torsemide  during 04/21/2024-04/28/2024 due to vacation and he ran out of pills.   Diet:  No updates   Since 03/26/2024 ICM Remote Transmission:  Corvue Thoracic impedance suggesting possible fluid accumulation starting 04/20/2024.   Prescribed: Torsemide  20 mg take 2 tablets (40 mg total) daily.   04/30/2024 Pt missed 4-5 days of Torsemide  during vacation 04/21/2024-04/28/2024.  Resumed 40 mg dosage on 04/29/2024. Potassium 20 mEq take 2 tablet(s) (40 mEq total) by mouth daily.  04/30/2024 Pt reports taking 20 mEq by mouth daily Spironolactone  25 mg take 1 tablet(s) (25 mEq total) by mouth daily   Labs: 09/23/2023 Creatinine 1.38, BUN 13, Potassium 3.7, Sodium 144, GFR 59 08/30/2023 Creatinine 1.47, BUN 16, Potassium 3.5, Sodium 142, GFR 55  06/08/2023 Creatinine 1.24, BUN 11, Potassium 3.9, Sodium 145, GFR 67  A complete set of results can be found in Results Review.   Recommendations:  He resumed Torsemide  40 mg daily on 04/29/2024.  Copy sent to Dr Rolan for review and recommendations if needed.     Follow-up plan: ICM clinic phone appointment on 05/08/2024 to recheck fluid levels.  Next 31 day scheduled for 06/08/2024.  91 day device clinic remote transmission 05/31/2024.     EP/Cardiology Office Visits:     Recall 10/27/2023 with Dr Rolan.    Recall 12/05/2023 with EP AP (6 month f/u).   Copy of  ICM check sent to Dr. Inocencio.   Remote monitoring is medically necessary for Heart Failure Management.    Daily Thoracic Impedance ICM trend: 01/31/2024 through 04/30/2024.    12-14 Month Thoracic Impedance ICM trend:     [name], RN 04/30/2024 8:37 AM

## 2024-04-30 NOTE — Progress Notes (Unsigned)
 Attempted call to patient and unable to reach. Advised to call back regarding recommendations received from Dr Rolan.    Left message to take Torsemide  60 mg x 3 days only and then return to 40 mg daily after the 3rd day.  Also advised labs needed in 10 days and will need to call back to schedule the appointment.  Advised HF clinic should be reaching out to him to schedule an appointment to be seen in the office in the next 2 weeks.   BMET order placed and will schedule at the HF clinic when patient returns call.

## 2024-05-01 NOTE — Progress Notes (Signed)
 Spoke with patient and provided Dr Orvilla recommendations to increase Torsemide  to 60 mg x 3 days and then return to 40 mg daily.  BMET scheduled for 05/11/2024 at HF clinic.  Advised HF clinic staff will call him to set up an appt in the next 2 weeks with HF clinic APP.

## 2024-05-08 ENCOUNTER — Ambulatory Visit: Attending: Cardiology

## 2024-05-08 DIAGNOSIS — I5022 Chronic systolic (congestive) heart failure: Secondary | ICD-10-CM

## 2024-05-08 DIAGNOSIS — Z95810 Presence of automatic (implantable) cardiac defibrillator: Secondary | ICD-10-CM

## 2024-05-08 NOTE — Progress Notes (Signed)
 EPIC Encounter for ICM Monitoring  Patient Name: Gary Williamson is a 59 y.o. male Date: 05/08/2024 Primary Care Physican: Oris Camie BRAVO, NP Primary Cardiologist: Rolan Electrophysiologist: Inocencio Bi-V Pacing: >99%     04/05/2022 Office Weight: 342 lbs 02/28/2023 Office Weight: 335 lbs 05/09/2023 Weight: 335 lbs 06/09/2023 Office Weight: 345 lbs 08/30/2023 Office Weight: 333 lbs 04/30/2024 Weight: 329 lbs   AT/AF Burden: 0% (taking Eliquis )    Spoke with patient and heart failure questions reviewed.  Transmission results reviewed.  Pt asymptomatic for fluid accumulation.  Reports feeling well.  Denies any dehydration symptoms.      Diet:  No updates   Since 04/30/2024 ICM Remote Transmission:  [place] Thoracic impedance suggesting fluid levels returned to normal 04/30/2024 after taking Torsemide  60 mg x 3 days.  Continues a pattern of possible fluid accumulation alternating with days with possible dryness.     Prescribed: Torsemide  20 mg take 2 tablets (40 mg total) daily.   Potassium 20 mEq take 2 tablet(s) (40 mEq total) by mouth daily.   Spironolactone  25 mg take 1 tablet(s) (25 mEq total) by mouth daily   Labs: 05/11/2024 BMET scheduled for HF clinic 09/23/2023 Creatinine 1.38, BUN 13, Potassium 3.7, Sodium 144, GFR 59 08/30/2023 Creatinine 1.47, BUN 16, Potassium 3.5, Sodium 142, GFR 55  06/08/2023 Creatinine 1.24, BUN 11, Potassium 3.9, Sodium 145, GFR 67  A complete set of results can be found in Results Review.   Recommendations:  Advised to be consistent with fluid intake and drink around 64 oz daily.      Follow-up plan: ICM clinic phone appointment on 06/08/2024.  91 day device clinic remote transmission 05/31/2024.     EP/Cardiology Office Visits:   Recall 10/27/2023 with Dr Rolan.    Recall 12/05/2023 with EP AP (6 month f/u).   Copy of ICM check sent to Dr. Inocencio.   Remote monitoring is medically necessary for Heart Failure Management.    Daily Thoracic Impedance ICM  trend: 02/08/2024 through 05/08/2024.    12-14 Month Thoracic Impedance ICM trend:     [name], RN 05/08/2024 10:36 AM

## 2024-05-11 ENCOUNTER — Ambulatory Visit (HOSPITAL_COMMUNITY)
Admission: RE | Admit: 2024-05-11 | Discharge: 2024-05-11 | Disposition: A | Source: Ambulatory Visit | Attending: Internal Medicine

## 2024-05-11 ENCOUNTER — Ambulatory Visit (HOSPITAL_COMMUNITY): Payer: Self-pay | Admitting: Cardiology

## 2024-05-11 DIAGNOSIS — I5022 Chronic systolic (congestive) heart failure: Secondary | ICD-10-CM | POA: Diagnosis present

## 2024-05-11 DIAGNOSIS — Z95810 Presence of automatic (implantable) cardiac defibrillator: Secondary | ICD-10-CM | POA: Diagnosis not present

## 2024-05-11 LAB — BASIC METABOLIC PANEL WITH GFR
Anion gap: 12 (ref 5–15)
BUN: 21 mg/dL — ABNORMAL HIGH (ref 6–20)
CO2: 28 mmol/L (ref 22–32)
Calcium: 9.2 mg/dL (ref 8.9–10.3)
Chloride: 101 mmol/L (ref 98–111)
Creatinine, Ser: 1.58 mg/dL — ABNORMAL HIGH (ref 0.61–1.24)
GFR, Estimated: 50 mL/min — ABNORMAL LOW
Glucose, Bld: 102 mg/dL — ABNORMAL HIGH (ref 70–99)
Potassium: 3.8 mmol/L (ref 3.5–5.1)
Sodium: 141 mmol/L (ref 135–145)

## 2024-05-18 ENCOUNTER — Other Ambulatory Visit (HOSPITAL_COMMUNITY): Payer: Self-pay | Admitting: Family Medicine

## 2024-05-21 ENCOUNTER — Telehealth: Payer: Self-pay | Admitting: Pharmacy Technician

## 2024-05-21 ENCOUNTER — Other Ambulatory Visit (HOSPITAL_COMMUNITY): Payer: Self-pay

## 2024-05-21 NOTE — Telephone Encounter (Signed)
 Pharmacy Patient Advocate Encounter   Received notification from [facility] that prior authorization for Mounjaro  15mg /0.62ml auto-injectors is required/requested.   Insurance verification completed.   The patient is insured through [facility].   Per test claim: PA required; PA submitted to above mentioned insurance via Prompt PA Key/confirmation #/EOC [id number] Status is pending

## 2024-05-22 ENCOUNTER — Ambulatory Visit (HOSPITAL_COMMUNITY): Payer: Self-pay | Admitting: Cardiology

## 2024-05-22 ENCOUNTER — Ambulatory Visit (HOSPITAL_COMMUNITY)
Admission: RE | Admit: 2024-05-22 | Discharge: 2024-05-22 | Disposition: A | Discharge status: Home / Self Care | Source: Ambulatory Visit | Attending: Cardiology | Admitting: Cardiology

## 2024-05-22 VITALS — BP 112/76 | HR 88 | Wt 326.0 lb

## 2024-05-22 DIAGNOSIS — I5022 Chronic systolic (congestive) heart failure: Secondary | ICD-10-CM | POA: Diagnosis not present

## 2024-05-22 DIAGNOSIS — Z79899 Other long term (current) drug therapy: Secondary | ICD-10-CM | POA: Diagnosis not present

## 2024-05-22 DIAGNOSIS — I110 Hypertensive heart disease with heart failure: Secondary | ICD-10-CM | POA: Diagnosis not present

## 2024-05-22 DIAGNOSIS — Z6841 Body Mass Index (BMI) 40.0 and over, adult: Secondary | ICD-10-CM | POA: Diagnosis not present

## 2024-05-22 DIAGNOSIS — E669 Obesity, unspecified: Secondary | ICD-10-CM | POA: Diagnosis not present

## 2024-05-22 DIAGNOSIS — I447 Left bundle-branch block, unspecified: Secondary | ICD-10-CM | POA: Diagnosis not present

## 2024-05-22 DIAGNOSIS — Z7901 Long term (current) use of anticoagulants: Secondary | ICD-10-CM | POA: Insufficient documentation

## 2024-05-22 DIAGNOSIS — E785 Hyperlipidemia, unspecified: Secondary | ICD-10-CM | POA: Insufficient documentation

## 2024-05-22 DIAGNOSIS — I480 Paroxysmal atrial fibrillation: Secondary | ICD-10-CM | POA: Diagnosis not present

## 2024-05-22 DIAGNOSIS — I428 Other cardiomyopathies: Secondary | ICD-10-CM | POA: Insufficient documentation

## 2024-05-22 LAB — CBC
HCT: 47.1 % (ref 39.0–52.0)
Hemoglobin: 15.9 g/dL (ref 13.0–17.0)
MCH: 30 pg (ref 26.0–34.0)
MCHC: 33.8 g/dL (ref 30.0–36.0)
MCV: 88.9 fL (ref 80.0–100.0)
Platelets: 276 K/uL (ref 150–400)
RBC: 5.3 MIL/uL (ref 4.22–5.81)
RDW: 14 % (ref 11.5–15.5)
WBC: 10.9 K/uL — ABNORMAL HIGH (ref 4.0–10.5)
nRBC: 0 % (ref 0.0–0.2)

## 2024-05-22 LAB — TSH: TSH: 2.54 u[IU]/mL (ref 0.350–4.500)

## 2024-05-22 LAB — COMPREHENSIVE METABOLIC PANEL WITH GFR
ALT: 22 U/L (ref 0–44)
AST: 24 U/L (ref 15–41)
Albumin: 4.6 g/dL (ref 3.5–5.0)
Alkaline Phosphatase: 76 U/L (ref 38–126)
Anion gap: 12 (ref 5–15)
BUN: 13 mg/dL (ref 6–20)
CO2: 28 mmol/L (ref 22–32)
Calcium: 9.3 mg/dL (ref 8.9–10.3)
Chloride: 102 mmol/L (ref 98–111)
Creatinine, Ser: 1.43 mg/dL — ABNORMAL HIGH (ref 0.61–1.24)
GFR, Estimated: 56 mL/min — ABNORMAL LOW
Glucose, Bld: 89 mg/dL (ref 70–99)
Potassium: 4 mmol/L (ref 3.5–5.1)
Sodium: 142 mmol/L (ref 135–145)
Total Bilirubin: 0.5 mg/dL (ref 0.0–1.2)
Total Protein: 7.9 g/dL (ref 6.5–8.1)

## 2024-05-22 NOTE — Patient Instructions (Signed)
 Good to see you today!  Labs done today, your results will be available in MyChart, we will contact you for abnormal readings.   Repeat lab work in 3 months  Your physician has requested that you have an echocardiogram. Echocardiography is a painless test that uses sound waves to create images of your heart. It provides your doctor with information about the size and shape of your heart and how well your hearts chambers and valves are working. This procedure takes approximately one hour. There are no restrictions for this procedure. Please do NOT wear cologne, perfume, aftershave, or lotions (deodorant is allowed). Please arrive 15 minutes prior to your appointment time.  Please note: We ask at that you not bring children with you during ultrasound (echo/ vascular) testing. Due to room size and safety concerns, children are not allowed in the ultrasound rooms during exams. Our front office staff cannot provide observation of children in our lobby area while testing is being conducted. An adult accompanying a patient to their appointment will only be allowed in the ultrasound room at the discretion of the ultrasound technician under special circumstances. We apologize for any inconvenience.  Your physician recommends that you schedule a follow-up appointment in: as scheduled  If you have any questions or concerns before your next appointment please send us  a message through [place] or call our office at [phone].    TO LEAVE A MESSAGE FOR THE NURSE SELECT OPTION 2, PLEASE LEAVE A MESSAGE INCLUDING: YOUR NAME DATE OF BIRTH CALL BACK NUMBER REASON FOR CALL**this is important as we prioritize the call backs  YOU WILL RECEIVE A CALL BACK THE SAME DAY AS LONG AS YOU CALL BEFORE 4:00 PM At the [facility], you and your health needs are our priority. As part of our continuing mission to provide you with exceptional heart care, we have created designated Provider Care Teams. These  Care Teams include your primary Cardiologist (physician) and Advanced Practice Providers (APPs- Physician Assistants and Nurse Practitioners) who all work together to provide you with the care you need, when you need it.   You may see any of the following providers on your designated Care Team at your next follow up: Dr Toribio Fuel Dr Ezra Shuck Dr. Morene Brownie Greig Mosses, NP Caffie Shed, [place] [facility] [place], [place] Beckey Coe, NP Jordan Lee, NP Ellouise Class, NP Tinnie Redman, PharmD Jaun Bash, PharmD   Please be sure to bring in all your medications bottles to every appointment.    Thank you for choosing [facility]

## 2024-05-24 ENCOUNTER — Other Ambulatory Visit (HOSPITAL_COMMUNITY): Payer: Self-pay

## 2024-05-24 NOTE — Progress Notes (Signed)
 Advanced Heart Failure Clinic Note    Patient ID: Gary Williamson, male   DOB: 11/04/1965, 59 y.o.   MRN: [id number] PCP: Rayfield Sharps Cardiology: Dr. Rolan  Chief complaint: CHF  59 y.o. with a h/o nonischemic cardiomyopathy, LBBB, HTN, VT and atrial fibrillation, who presents for followup of CHF.  Echo in 12/14 showed that his EF remained 20-25%.  He had a St Jude CRT-D system placed in 4/15.  Echo in 12/15 showed EF 25-30%, mild LV dilation, normal RV size and systolic function.    Echo in 7/21 showed EF up to 40-45%, diffuse hypokinesis, normal RV. Echo in 11/22 showed EF 35-40%, mild LV dilation.   Patient had ICD shock in 7/23 due to rapid atrial fibrillation (inappropriate).  In 8/23, he had VT with ATP then shock.  Device clinic was unable to contact him. He had been standing outside in the heat for a long period at a funeral and had not taken his meds that day.  No loss of consciousness or fall.   CPX in 10/23 showed mild functional limitation primarily due to body habitus.    Echo 12/23 showed EF 40%, mild LVH, mild LV dilation, mildly decreased RV systolic function.   He returns today for followup of CHF.  He walks for exercise and works full time.  Weight is down 9 lbs.  Mild dyspnea walking fast up a flight of stairs.  No orthopnea/PND.  No chest pain.  No lightheadedness.   ECG (personally reviewed): NSR, BiV pacing   Labs (12/23): K 3.6, creatinine 1.24, LFTs normal  Labs (4/24): LDL 100 Labs (2/25): K 3.9, creatinine 1.24, normal LFTs and TFTs Labs (4/25): LDL 78 Labs (1/26): K 3.8, creatinine 1.58  Allergies (verified):  No Known Drug Allergies  Past Medical History: 1. Cardiomyopathy: Nonischemic.  He had a left heart cath in 2002 or 2003 with no significant coronary disease per his report (this was done in [place]). It sounds like his CMP has been present since about 2000.  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  Echo (5/11):  severe LV dilation with EF < 20%, diffuse hypokinesis, moderate diastolic dysfunction, severe left atrial enlargement.  First hospitalization for CHF was in 5/11.  No drug use.  Adenosine myoview (6/11): EF 21%, diffuse hypokinesis, scar in the anterior and inferior walls and at the apex.  Mild peri-infarct ischemia.  LHC was done (7/11) showing EF 15%, global hypokinesis, LVEDP 22 mmHg, no angiographic CAD.  Echo (10/11): EF 20-25%, severely dilated LV with diffuse hypokinesis, mild MR.  Echo (12/14) with EF 20-25%, moderately dilated LV, diffuse hypokinesis, cannot rule out apical thrombus.  He was unable to tolerate cardiac MRI.  He had St Jude CRT-D implantation in 4/15. Echo (12/15) with EF 25-30%, mild LV dilation, mild LVH, normal RV size and systolic function.  - Echo (7/21): EF 40-45%, diffuse hypokinesis, normal RV.  - Echo (11/22): EF 35-40%, mild LV dilation. - CPX (10/23): peak VO2 15.7, RER 1.18, VE/VCO2 slope 31.  Mild functional limitation primarily due to obesity/deconditioning.  - Echo (12/23): EF 40%, mild LVH, mild LV dilation, mildly decreased RV systolic function.  2. Hypertension 3. Chronic left bundle branch block 4. Hyperlipidemia 5. Atrial fibrillation: Paroxysmal, noted on device interrogation.  6. VT 7. Type 2 diabetes  Family History: Family history is negative for premature diagnosis of coronary artery disease or any arrhythmia or history of heart failure in first-degree relatives.  Mother and father do have a history of hypertension  Social History: The patient lives in [place] with his wife and 2 children.   He is a data processing manager for [employer].  He denies significant tobacco, EtOH, nor any illicit drug use, herbal medication  use, regular diet, and no regular exercise  Review of Systems        All systems reviewed and negative except as per HPI.   Current Outpatient Medications  Medication Sig Dispense Refill   amiodarone  (PACERONE )  200 MG tablet Take 1 tablet (200 mg total) by mouth daily. 90 tablet 3   apixaban  (ELIQUIS ) 5 MG TABS tablet Take 1 tablet (5 mg total) by mouth 2 (two) times daily. 180 tablet 3   blood glucose meter kit and supplies Dispense based on patient and insurance preference. Check blood sugar every morning before eating and up to three additional times a day as needed (FOR ICD-10 E10.9, E11.9). 1 each 0   dapagliflozin  propanediol (FARXIGA ) 10 MG TABS tablet Take 1 tablet (10 mg total) by mouth daily. 90 tablet 3   fish oil-omega-3 fatty acids  1000 MG capsule Take 1 capsule (1 g total) by mouth daily. 30 capsule 2   Lancets (ONETOUCH DELICA PLUS LANCET33G) MISC USE 1 LANCET FOR FINGERSTICK TO CHECK GLUCOSE IN THE MORNING BEFORE  EATING  AND  UP  TO  3  ADDITIONAL  TIMES  PER  DAY  AS  NEEDED 100 each 0   metoprolol  succinate (TOPROL -XL) 100 MG 24 hr tablet Take 1 tablet (100 mg total) by mouth 2 (two) times daily. 60 tablet 0   Multiple Vitamin (MULTIVITAMIN) tablet Take 1 tablet by mouth daily.     ONETOUCH VERIO test strip USE 1 STRIP TO CHECK GLUCOSE ONCE DAILY IN THE MORNING BEFORE EATING AND UP TO THREE ADDITIONAL TIMES DAILY AS NEEDED 100 each 0   potassium chloride  SA (KLOR-CON  M) 20 MEQ tablet Take 2 tablets (40 mEq total) by mouth daily. 180 tablet 3   rosuvastatin  (CRESTOR ) 20 MG tablet Take 2 tabs (40mg ) by mouth Monday, Wednesday, and Friday. Take 1 tab (20mg ) by mouth Sunday, Tuesday, Thursday, and Saturday. For cholesterol. 180 tablet 3   sacubitril -valsartan  (ENTRESTO ) 97-103 MG Take 1 tablet by mouth 2 (two) times daily. 180 tablet 3   spironolactone  (ALDACTONE ) 25 MG tablet Take 1 tablet (25 mg total) by mouth daily. 90 tablet 3   tirzepatide  (MOUNJARO ) 15 MG/0.5ML Pen Inject 15 mg into the skin once a week. 6 mL 3   torsemide  (DEMADEX ) 20 MG tablet Take 2 tablets (40 mg total) by mouth daily. 60 tablet 6   Vitamin D , Ergocalciferol , (DRISDOL ) 1.25 MG (50000 UNIT) CAPS capsule Take 1  capsule (50,000 Units total) by mouth every 7 (seven) days. 12 capsule 3   No current facility-administered medications for this encounter.   Wt Readings from Last 3 Encounters:  05/22/24 (!) 147.9 kg (326 lb)  08/30/23 (!) 151.3 kg (333 lb 9.6 oz)  06/27/23 (!) 152 kg (335 lb)   BP 112/76   Pulse 88   Wt (!) 147.9 kg (326 lb)   SpO2 98%   BMI 44.21 kg/m   PHYSICAL EXAM: General: NAD, obese.  Neck: No JVD, no thyromegaly or thyroid  nodule.  Lungs: Clear to auscultation bilaterally with normal respiratory effort. CV: Nondisplaced PMI.  Heart regular S1/S2, no S3/S4, no murmur.  No peripheral edema.  No carotid bruit.  Normal pedal pulses.  Abdomen: Soft,  nontender, no hepatosplenomegaly, no distention.  Skin: Intact without lesions or rashes.  Neurologic: Alert and oriented x 3.  Psych: Normal affect. Extremities: No clubbing or cyanosis.  HEENT: Normal.   Assessment/Plan: 1. Nonischemic cardiomyopathy: St Jude CRT-D device.  Most recent echo 12/23 showed EF 40% (stable), mild LVH, mild LV dilation, mildly decreased RV systolic function. CPX in 10/23 showed mild functional limitation primarily due to body habitus. NYHA class II symptoms, he is not volume overloaded on exam.  - Continue torsemide  40 mg daily + 40 KCL daily. BMET/BNP today.  - Continue Toprol  XL 100 mg bid.  - Continue Entresto  97/103 bid  - Continue spironolactone  25 mg daily.  - Continue dapagliflozin  10 mg daily.  - Did not tolerate Bidil (headaches) - I will arrange for echo.  2. Obesity: Body mass index is 44.21 kg/m. Needs significant weight loss.    - Now on tirzepatide  (Mounjaro ), weight trending down slowly.  3. Hyperlipidemia: Continue Crestor . 4. Atrial fibrillation: Paroxysmal. He is in NSR today.   - Continue amiodarone  200 mg daily. Check LFTs and TSH today. Needs regular eye exam - Continue apixaban  5 mg bid. CBC today.  5. VT: Episode in 8/23.  He had 2 shocks, 1 inappropriate due to AF and  the other appropriate. Not associated with chest pain, doubt ACS.  - Continue Toprol  XL. - Continue amiodarone  200 mg daily.  - Followed by EP   BMET 3 months, see APP in 6 months.   I spent 31 minutes reviewing records, interviewing/examining patient, and managing orders.   Ezra Shuck,  05/24/2024

## 2024-05-28 ENCOUNTER — Other Ambulatory Visit (HOSPITAL_COMMUNITY): Payer: Self-pay

## 2024-05-28 NOTE — Telephone Encounter (Signed)
 Additional information, chart notes, and labs faxed back to plan today at 3:15pm.EOC# [id number] on form

## 2024-05-30 ENCOUNTER — Other Ambulatory Visit (HOSPITAL_COMMUNITY): Payer: Self-pay

## 2024-05-31 ENCOUNTER — Ambulatory Visit: Attending: Cardiology

## 2024-05-31 ENCOUNTER — Ambulatory Visit: Payer: Self-pay | Admitting: Cardiology

## 2024-05-31 DIAGNOSIS — I5022 Chronic systolic (congestive) heart failure: Secondary | ICD-10-CM

## 2024-05-31 LAB — CUP PACEART REMOTE DEVICE CHECK
Battery Remaining Longevity: 58 mo
Battery Remaining Percentage: 63 %
Battery Voltage: 2.95 V
Brady Statistic AP VP Percent: 1.6 %
Brady Statistic AP VS Percent: 1 %
Brady Statistic AS VP Percent: 98 %
Brady Statistic AS VS Percent: 1 %
Brady Statistic RA Percent Paced: 1.5 %
Date Time Interrogation Session: 20260129020214
HighPow Impedance: 78 Ohm
Implantable Lead Connection Status: 753985
Implantable Lead Connection Status: 753985
Implantable Lead Connection Status: 753985
Implantable Lead Implant Date: 20150421
Implantable Lead Implant Date: 20150421
Implantable Lead Implant Date: 20150421
Implantable Lead Location: 753857
Implantable Lead Location: 753859
Implantable Lead Location: 753860
Implantable Pulse Generator Implant Date: 20230421
Lead Channel Impedance Value: 360 Ohm
Lead Channel Impedance Value: 450 Ohm
Lead Channel Impedance Value: 830 Ohm
Lead Channel Pacing Threshold Amplitude: 0.75 V
Lead Channel Pacing Threshold Amplitude: 1 V
Lead Channel Pacing Threshold Amplitude: 1 V
Lead Channel Pacing Threshold Pulse Width: 0.5 ms
Lead Channel Pacing Threshold Pulse Width: 0.5 ms
Lead Channel Pacing Threshold Pulse Width: 0.5 ms
Lead Channel Sensing Intrinsic Amplitude: 12 mV
Lead Channel Sensing Intrinsic Amplitude: 3.6 mV
Lead Channel Setting Pacing Amplitude: 2 V
Lead Channel Setting Pacing Amplitude: 2 V
Lead Channel Setting Pacing Amplitude: 2 V
Lead Channel Setting Pacing Pulse Width: 0.5 ms
Lead Channel Setting Pacing Pulse Width: 0.5 ms
Lead Channel Setting Sensing Sensitivity: 0.5 mV
Pulse Gen Serial Number: 210000231

## 2024-05-31 NOTE — Progress Notes (Signed)
 31 day ICM Remote transmission canceled due to [facility] clinic is on hold until further notice.  91 day remote monitoring will continue per protocol.

## 2024-06-01 ENCOUNTER — Other Ambulatory Visit (HOSPITAL_COMMUNITY): Payer: Self-pay

## 2024-06-05 ENCOUNTER — Other Ambulatory Visit (HOSPITAL_COMMUNITY): Payer: Self-pay

## 2024-06-05 NOTE — Telephone Encounter (Signed)
 Pharmacy Patient Advocate Encounter  Received notification from [employer] that Prior Authorization for Mounjaro  15mg /0.51ml auto-injectors has been APPROVED from 05/29/2024 to 05/28/2025.   PA #/Case ID/Reference #: [id number]

## 2024-06-07 NOTE — Progress Notes (Signed)
 Remote ICD Transmission

## 2024-06-08 ENCOUNTER — Ambulatory Visit (HOSPITAL_COMMUNITY): Admission: RE | Admit: 2024-06-08 | Source: Ambulatory Visit

## 2024-06-08 ENCOUNTER — Ambulatory Visit

## 2024-06-08 DIAGNOSIS — I5022 Chronic systolic (congestive) heart failure: Secondary | ICD-10-CM

## 2024-06-08 LAB — ECHOCARDIOGRAM COMPLETE
Area-P 1/2: 8.82 cm2
Calc EF: 45.5 %
S' Lateral: 3.9 cm
Single Plane A2C EF: 42.6 %
Single Plane A4C EF: 48 %

## 2024-06-08 NOTE — Progress Notes (Signed)
" °  Echocardiogram 2D Echocardiogram has been performed.  Koleen KANDICE Popper, RDCS 06/08/2024, 8:54 AM "

## 2024-08-20 ENCOUNTER — Ambulatory Visit (HOSPITAL_COMMUNITY)

## 2024-08-29 ENCOUNTER — Encounter: Payer: Self-pay | Admitting: Nurse Practitioner

## 2024-08-30 ENCOUNTER — Ambulatory Visit

## 2024-11-19 ENCOUNTER — Ambulatory Visit (HOSPITAL_COMMUNITY)

## 2024-11-29 ENCOUNTER — Ambulatory Visit

## 2025-02-28 ENCOUNTER — Ambulatory Visit

## 2025-05-30 ENCOUNTER — Ambulatory Visit

## 2025-08-29 ENCOUNTER — Ambulatory Visit
